# Patient Record
Sex: Male | Born: 1955 | Race: White | Hispanic: No | Marital: Married | Smoking: Former smoker
Health system: Southern US, Community
[De-identification: ages and names within clinical notes are randomized; demographics above are authoritative.]

## PROBLEM LIST (undated history)

## (undated) ENCOUNTER — Inpatient Hospital Stay: Admission: EM | Payer: Self-pay

## (undated) DIAGNOSIS — T4145XA Adverse effect of unspecified anesthetic, initial encounter: Secondary | ICD-10-CM

## (undated) DIAGNOSIS — I499 Cardiac arrhythmia, unspecified: Secondary | ICD-10-CM

## (undated) DIAGNOSIS — J449 Chronic obstructive pulmonary disease, unspecified: Secondary | ICD-10-CM

## (undated) DIAGNOSIS — I10 Essential (primary) hypertension: Secondary | ICD-10-CM

## (undated) DIAGNOSIS — R609 Edema, unspecified: Secondary | ICD-10-CM

## (undated) DIAGNOSIS — G4730 Sleep apnea, unspecified: Secondary | ICD-10-CM

## (undated) DIAGNOSIS — T7840XA Allergy, unspecified, initial encounter: Secondary | ICD-10-CM

## (undated) DIAGNOSIS — R51 Headache: Secondary | ICD-10-CM

## (undated) DIAGNOSIS — M1990 Unspecified osteoarthritis, unspecified site: Secondary | ICD-10-CM

## (undated) DIAGNOSIS — L570 Actinic keratosis: Secondary | ICD-10-CM

## (undated) DIAGNOSIS — I2510 Atherosclerotic heart disease of native coronary artery without angina pectoris: Secondary | ICD-10-CM

## (undated) DIAGNOSIS — J45909 Unspecified asthma, uncomplicated: Secondary | ICD-10-CM

## (undated) DIAGNOSIS — T8859XA Other complications of anesthesia, initial encounter: Secondary | ICD-10-CM

## (undated) DIAGNOSIS — R918 Other nonspecific abnormal finding of lung field: Secondary | ICD-10-CM

## (undated) DIAGNOSIS — R002 Palpitations: Secondary | ICD-10-CM

## (undated) DIAGNOSIS — K219 Gastro-esophageal reflux disease without esophagitis: Secondary | ICD-10-CM

## (undated) DIAGNOSIS — Z87442 Personal history of urinary calculi: Secondary | ICD-10-CM

## (undated) DIAGNOSIS — E538 Deficiency of other specified B group vitamins: Secondary | ICD-10-CM

## (undated) DIAGNOSIS — R519 Headache, unspecified: Secondary | ICD-10-CM

## (undated) HISTORY — PX: CHOLECYSTECTOMY: SHX55

## (undated) HISTORY — PX: COLONOSCOPY: SHX174

## (undated) HISTORY — DX: Actinic keratosis: L57.0

---
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
## 2006-04-12 ENCOUNTER — Ambulatory Visit: Payer: Self-pay | Admitting: Gastroenterology

## 2007-02-17 ENCOUNTER — Emergency Department: Payer: Self-pay | Admitting: Emergency Medicine

## 2009-10-23 ENCOUNTER — Emergency Department (HOSPITAL_COMMUNITY): Admission: EM | Admit: 2009-10-23 | Discharge: 2009-10-23 | Payer: Self-pay | Admitting: Emergency Medicine

## 2010-08-13 ENCOUNTER — Ambulatory Visit: Payer: Self-pay | Admitting: Gastroenterology

## 2010-08-14 LAB — PATHOLOGY REPORT

## 2010-12-27 LAB — POCT I-STAT, CHEM 8
Calcium, Ion: 1.16 mmol/L (ref 1.12–1.32)
HCT: 45 % (ref 39.0–52.0)
Hemoglobin: 15.3 g/dL (ref 13.0–17.0)
Sodium: 139 mEq/L (ref 135–145)
TCO2: 25 mmol/L (ref 0–100)

## 2013-11-29 LAB — CBC
HCT: 44.4 % (ref 40.0–52.0)
HGB: 15.6 g/dL (ref 13.0–18.0)
MCH: 29.2 pg (ref 26.0–34.0)
MCHC: 35.1 g/dL (ref 32.0–36.0)
MCV: 83 fL (ref 80–100)
Platelet: 231 10*3/uL (ref 150–440)
RBC: 5.33 10*6/uL (ref 4.40–5.90)
RDW: 13.2 % (ref 11.5–14.5)
WBC: 19.7 10*3/uL — AB (ref 3.8–10.6)

## 2013-11-29 LAB — BASIC METABOLIC PANEL
Anion Gap: 6 — ABNORMAL LOW (ref 7–16)
BUN: 11 mg/dL (ref 7–18)
CALCIUM: 9 mg/dL (ref 8.5–10.1)
CREATININE: 1.08 mg/dL (ref 0.60–1.30)
Chloride: 100 mmol/L (ref 98–107)
Co2: 29 mmol/L (ref 21–32)
EGFR (Non-African Amer.): >60
Glucose: 123 mg/dL — ABNORMAL HIGH (ref 65–99)
Osmolality: 271 (ref 275–301)
Potassium: 3.5 mmol/L (ref 3.5–5.1)
Sodium: 135 mmol/L — ABNORMAL LOW (ref 136–145)

## 2013-11-29 LAB — TROPONIN I: Troponin-I: <0.02 ng/mL

## 2013-11-30 ENCOUNTER — Inpatient Hospital Stay: Payer: Self-pay | Admitting: Internal Medicine

## 2013-11-30 LAB — CK-MB: CK-MB: 0.6 ng/mL (ref 0.5–3.6)

## 2013-11-30 LAB — TROPONIN I: Troponin-I: <0.02 ng/mL

## 2013-12-01 LAB — BASIC METABOLIC PANEL
ANION GAP: 4 — AB (ref 7–16)
BUN: 13 mg/dL (ref 7–18)
CO2: 26 mmol/L (ref 21–32)
CREATININE: 0.98 mg/dL (ref 0.60–1.30)
Calcium, Total: 8.5 mg/dL (ref 8.5–10.1)
Chloride: 105 mmol/L (ref 98–107)
GLUCOSE: 90 mg/dL (ref 65–99)
Osmolality: 270 (ref 275–301)
Potassium: 3.9 mmol/L (ref 3.5–5.1)
Sodium: 135 mmol/L — ABNORMAL LOW (ref 136–145)

## 2013-12-01 LAB — CBC WITH DIFFERENTIAL/PLATELET
BASOS ABS: 0.1 10*3/uL (ref 0.0–0.1)
BASOS PCT: 0.6 %
EOS ABS: 0 10*3/uL (ref 0.0–0.7)
EOS PCT: 0 %
HCT: 39.3 % — ABNORMAL LOW (ref 40.0–52.0)
HGB: 13.2 g/dL (ref 13.0–18.0)
LYMPHS ABS: 2.5 10*3/uL (ref 1.0–3.6)
Lymphocyte %: 14.9 %
MCH: 28.2 pg (ref 26.0–34.0)
MCHC: 33.5 g/dL (ref 32.0–36.0)
MCV: 84 fL (ref 80–100)
MONO ABS: 1.2 x10 3/mm — AB (ref 0.2–1.0)
Monocyte %: 7.2 %
NEUTROS ABS: 13 10*3/uL — AB (ref 1.4–6.5)
NEUTROS PCT: 77.3 %
Platelet: 230 10*3/uL (ref 150–440)
RBC: 4.66 10*6/uL (ref 4.40–5.90)
RDW: 12.9 % (ref 11.5–14.5)
WBC: 16.8 10*3/uL — ABNORMAL HIGH (ref 3.8–10.6)

## 2013-12-02 LAB — BASIC METABOLIC PANEL
ANION GAP: 5 — AB (ref 7–16)
BUN: 12 mg/dL (ref 7–18)
CHLORIDE: 106 mmol/L (ref 98–107)
CO2: 26 mmol/L (ref 21–32)
Calcium, Total: 8.3 mg/dL — ABNORMAL LOW (ref 8.5–10.1)
Creatinine: 0.94 mg/dL (ref 0.60–1.30)
GLUCOSE: 88 mg/dL (ref 65–99)
Osmolality: 273 (ref 275–301)
POTASSIUM: 3.8 mmol/L (ref 3.5–5.1)
SODIUM: 137 mmol/L (ref 136–145)

## 2013-12-02 LAB — CBC WITH DIFFERENTIAL/PLATELET
Basophil #: 0.1 10*3/uL (ref 0.0–0.1)
Basophil %: 0.6 %
Eosinophil #: 0 10*3/uL (ref 0.0–0.7)
Eosinophil %: 0.1 %
HCT: 38.3 % — AB (ref 40.0–52.0)
HGB: 13 g/dL (ref 13.0–18.0)
Lymphocyte #: 2.7 10*3/uL (ref 1.0–3.6)
Lymphocyte %: 22.8 %
MCH: 28.6 pg (ref 26.0–34.0)
MCHC: 33.9 g/dL (ref 32.0–36.0)
MCV: 84 fL (ref 80–100)
Monocyte #: 1 x10 3/mm (ref 0.2–1.0)
Monocyte %: 8.1 %
NEUTROS ABS: 8.2 10*3/uL — AB (ref 1.4–6.5)
Neutrophil %: 68.4 %
Platelet: 242 10*3/uL (ref 150–440)
RBC: 4.54 10*6/uL (ref 4.40–5.90)
RDW: 13.1 % (ref 11.5–14.5)
WBC: 12 10*3/uL — AB (ref 3.8–10.6)

## 2013-12-03 LAB — WBC: WBC: 11.7 10*3/uL — AB (ref 3.8–10.6)

## 2013-12-04 LAB — EXPECTORATED SPUTUM ASSESSMENT W REFEX TO RESP CULTURE

## 2013-12-05 LAB — CULTURE, BLOOD (SINGLE)

## 2014-01-16 ENCOUNTER — Ambulatory Visit: Payer: Self-pay | Admitting: Internal Medicine

## 2014-10-22 DIAGNOSIS — I10 Essential (primary) hypertension: Secondary | ICD-10-CM | POA: Insufficient documentation

## 2014-10-30 ENCOUNTER — Ambulatory Visit: Payer: Self-pay | Admitting: Hospice and Palliative Medicine

## 2015-02-01 NOTE — H&P (Signed)
PATIENT NAME:  Derrick, Sheppard MR#:  [id number] DATE OF BIRTH:  November 20, 1955  DATE OF ADMISSION:  11/30/2013  PRIMARY CARE PHYSICIAN:  Dr. Ramonita Lab.   REFERRING PHYSICIAN:  Dr. Owens Shark.   CHIEF COMPLAINT:  Shortness of breath and fever.   HISTORY OF PRESENT ILLNESS:  The patient is a 59 year old Caucasian male with past medical history of COPD, still smoking 1 pack a day, GERD, is presenting to the ER with a chief complaint of shortness of breath and cough for the past two weeks.  The patient is reporting that he has been having chest tightness associated with shortness of breath on and off for the past two weeks.  The patient was seen by his primary care physician, Dr. Caryl Comes last Monday and he was diagnosed with strep throat and bronchitis.  The patient was started on doxycycline and by mouth prednisone with no significant improvement.  The patient has a chronic history of COPD, still smoking, but not on home oxygen.  As the patient's shortness of breath is progressively getting worse associated with chest tightness.  Family members have called EMS.  As per EMS, the patient was hypoxic with pulse ox 86% on room air.  He was placed on 2 liters of oxygen and subsequently pulse ox went up to 93%.  Chest x-ray has revealed a right upper lobe air space disease with some fluid collection in the left side.  The patient is complaining of productive cough for the past 10 days and reporting that his whole chest hurts while taking deep inspirations and coughing.  He felt like he was feverish, but did not check temperature.  Denies any dizziness or loss of consciousness.  Denies any heart attacks in the past.  The patient's wife, daughter and granddaughter are at bedside.  The patient was given IV morphine in the ER to help with the pain.  Initial troponin is negative.   PAST MEDICAL HISTORY:   1.  GERD.  2.  COPD.  3.  Nicotine dependence.   PAST SURGICAL HISTORY:  Colonoscopy status post polypectomy and polyp  was benign.   ALLERGIES:  CODEINE.   PSYCHOSOCIAL HISTORY:  Lives at home with wife.  Smokes 1 pack a day, started at age 79.  Still continues to smoke.  Denies alcohol or illicit drug usage.   FAMILY HISTORY:  Dad deceased with some heart valve abnormalities.   HOME MEDICATIONS:   1.  Spiriva capsule one inhalation once a day.  2.  Advair 1 puff inhalation 2 times a day.   REVIEW OF SYSTEMS:  CONSTITUTIONAL:  Complaining of low-grade fever, fatigue.  EYES:  Denies blurry vision, double vision.  EARS, NOSE, THROAT:  Denies epistaxis, discharge.  Complaining of sore throat.  RESPIRATION:  Complaining of productive cough.  Has chronic history of COPD.  Chest hurts with coughing.  CARDIOVASCULAR:  No orthopnea, edema, erythema.  Complaining of chest pain with cough.  GASTROINTESTINAL:  Denies nausea, vomiting, diarrhea, abdominal pain.  GENITOURINARY:  No dysuria or hematuria.  ENDOCRINE:  Denies polyuria, nocturia, thyroid problems.  HEMATOLOGIC AND LYMPHATIC:  No anemia, easy bruising, bleeding.  INTEGUMENTARY:  No acne, rash, lesions.  MUSCULOSKELETAL:  No joint pain in the neck and back.  Denies gout.  NEUROLOGIC:  Denies vertigo, ataxia.  PSYCHIATRIC:  No ADD, OCD.   PHYSICAL EXAMINATION:  VITAL SIGNS:  Temperature 97.6, pulse 73, respirations 20, blood pressure 159/91, pulse ox 98% on 2 liters.  GENERAL APPEARANCE:  Not under acute  distress.  Moderately built and nourished, uncomfortable with diffuse chest tenderness while coughing.  HEENT:  Normocephalic, atraumatic.  Pupils are equal, reacting light and accommodation.  No scleral icterus.  No conjunctival injection.  No sinus tenderness.  No postnasal drip.  Throat is erythematous.  Uvula is midline.  NECK:  Supple.  No JVD.  No thyromegaly.  Range of motion is intact.  LUNGS:  Coarse bronchial breath sounds with crackles more on the right side and decreased breath sounds on the left side.  Positive anterior chest wall tenderness  on palpation.  CARDIAC:  S1, S2 normal.  Regular rate and rhythm.  No murmurs.  GASTROINTESTINAL:  Soft.  Bowel sounds are positive in all four quadrants.  Nontender, nondistended.  No hepatosplenomegaly.  No masses felt.  NEUROLOGIC:  Awake, oriented x 3.  Cranial nerves II through XII are grossly intact.  Motor and sensory are intact.  Reflexes are 2+.  EXTREMITIES:  No edema.  No cyanosis.  No clubbing.  SKIN:  Warm to touch.  Normal turgor.  No rashes.  No lesions.  MUSCULOSKELETAL:  No joint effusion, tenderness, erythema.  PSYCHIATRIC:  Normal mood and affect.   LABORATORIES AND IMAGING STUDIES:  Troponin is less than 0.02.  CBC is normal except white count is elevated at 19.7.  Chem-8 is normal except glucose at 123, sodium at 135.  Anion gap 6.  The rest of the Chem-8 labs are normal.  A 12-lead EKG, normal sinus rhythm at 70 beats per minute, normal PR interval.  No acute ST-T wave changes.  Chest x-ray, portable, has revealed peripheral right upper lobe airspace opacity concerning for pneumonia.  Recommended follow-up after treatment to ensure resolution.  Left base atelectasis, questionable small left effusion, chronic COPD with chronic changes.   ASSESSMENT AND PLAN:  A 59 year old male presenting to the ER with a chief complaint of two week history of intermittent episodes of shortness of breath and cough started.  The patient was seen by primary care physician and started on by mouth doxycycline and prednisone last Monday, will be admitted with the following assessment and plan.  1.  Right upper lobe pneumonia, probably community-acquired, failed outpatient antibiotics.  We will admit him to tele bed.  We will provide him IV levofloxacin.  Sputum cultures were ordered.  We will provide him DuoNeb neb treatments q. 6 hours as needed for shortness of breath and wheezing. 2.  Generalized chest pain which is reproducible in nature, probably musculoskeletal in nature from persistent cough for  the past two weeks, could be from pleuritic as well because of the underlying pneumonia.  We will provide him ibuprofen as needed basis.  3.  Chronic history of chronic obstructive pulmonary disease.  Still continues to smoke.  We will provide him DuoNeb neb treatments as-needed basis.   4.  Nicotine dependence.  The patient was counseled to quit smoking for three minutes.  He will think about it.  5.  Gastroesophageal reflux disease.  The patient will be on PPI.  6.  We will provide him deep vein thrombosis prophylaxis. 7.  CODE STATUS:  HE IS FULL CODE.  Wife is medical power of attorney.   Diagnosis and plan of care was discussed with the patient and his family members at bedside.  They all verbalized understanding of the plan.  The patient will be transferred to Dr. Ramonita Lab in a.m.   Total time spent on admission is 45 minutes.    ____________________________ Nicholes Mango,  MD ag:ea D: 11/30/2013 02:04:00 ET T: 11/30/2013 02:42:24 ET JOB#: [id number]  cc: Nicholes Mango, MD, <Dictator> Adin Hector, MD Nicholes Mango MD ELECTRONICALLY SIGNED 12/14/2013 7:10

## 2015-02-01 NOTE — Discharge Summary (Signed)
PATIENT NAME:  Derrick Sheppard, Derrick Sheppard MR#:  [id number] DATE OF BIRTH:  April 08, 1956  DATE OF ADMISSION:  11/30/2013 DATE OF DISCHARGE:  12/03/2013  FINAL DIAGNOSES: 1. Acute respiratory failure secondary to pneumonia.  2. Right upper lobe pneumonia.  3. Chronic obstructive pulmonary disease with acute exacerbation.  4. Hypertension.  5. Tobacco abuse.   HISTORY AND PHYSICAL: Please see dictated admission history and physical.   HOSPITAL COURSE: The patient was admitted with finding of acute respiratory failure as documented by his need for supplemental oxygen to keep his saturation up. He had significant chest pain, pleuritic as well. Cardiac enzymes were followed, which were negative. Chest x-ray revealed right upper lobe pneumonia, which was new from approximately three days ago in the office. He underwent CT scan by PE protocol on 11/30/2013, which confirmed the pneumonia, showed no evidence of pulmonary embolism. He was noted to have right hilar adenopathy, likely reactive, with a 15 mm right lower lobe nodule and a 17 mm left upper lobe nodule. The recommendation was to follow these nodules after treatment of the above and consider biopsy.   He showed slow but steady improvement in his chest discomfort, was weaned over to oral medications and oral antibiotics. He was continued on medications for COPD exacerbation and tolerated this well. Oxygen was weaned down; however, on the day of discharge, his oxygen saturation is 88% on room air at bedside. When he ambulates his oxygen would also stay around 88%, then recover after being placed on 2 liters nasal cannula up in the 93% range.  Therefore, home oxygen will be set up for the patient. At this point, he will be discharged home in stable condition with his physical activity up as tolerated with oxygen. Follow-up in our office in 7 to 10 days. He will follow a 2 gram sodium diet.   DISCHARGE MEDICATIONS: 1. Spiriva 1 capsule inhaled daily.  2. Advair  250/50 1 puff b.i.d.  3. Vitamin B12 1000 mcg p.o. daily.  4. Vitamin D3 1000 units p.o. daily.  5. Albuterol metered-dose inhaler with adapter 2 puffs 4 times a day.  6. Prednisone taper starting at 60 mg p.o. daily, decreasing x 10 mg every two days.  7. Norco 5/325, 1 p.o. q.4h. as needed for pain.  8. Amlodipine 5 mg p.o. daily. 9. Levaquin 750 mg p.o. daily x 10 days to complete 14 day course.  10. NicoDerm patch 21 mg topically daily to be weaned down to 14 mg after six weeks, then 7 mg six weeks later.   He will be maintained on oxygen 2 liters nasal cannula continuously with a portable system.  ____________________________ Tama High III, MD bjk:sg D: 12/03/2013 08:16:42 ET T: 12/03/2013 11:09:43 ET JOB#: [id number]  cc: Tama High III, MD, <Dictator>  Ramonita Lab MD ELECTRONICALLY SIGNED 12/05/2013 8:05

## 2015-02-03 ENCOUNTER — Ambulatory Visit
Admit: 2015-02-03 | Disposition: A | Discharge status: Home / Self Care | Payer: Self-pay | Attending: Internal Medicine | Admitting: Internal Medicine

## 2015-12-30 ENCOUNTER — Other Ambulatory Visit: Payer: Self-pay | Admitting: Acute Care

## 2015-12-30 DIAGNOSIS — F17210 Nicotine dependence, cigarettes, uncomplicated: Secondary | ICD-10-CM

## 2016-01-14 ENCOUNTER — Other Ambulatory Visit: Payer: Self-pay | Admitting: Internal Medicine

## 2016-01-14 DIAGNOSIS — R748 Abnormal levels of other serum enzymes: Secondary | ICD-10-CM

## 2016-01-22 ENCOUNTER — Ambulatory Visit
Admission: RE | Admit: 2016-01-22 | Discharge: 2016-01-22 | Disposition: A | Discharge status: Home / Self Care | Payer: 59 | Source: Ambulatory Visit | Attending: Internal Medicine | Admitting: Internal Medicine

## 2016-01-22 DIAGNOSIS — R748 Abnormal levels of other serum enzymes: Secondary | ICD-10-CM | POA: Diagnosis present

## 2016-01-22 DIAGNOSIS — K8020 Calculus of gallbladder without cholecystitis without obstruction: Secondary | ICD-10-CM | POA: Diagnosis not present

## 2016-02-04 ENCOUNTER — Telehealth: Payer: Self-pay | Admitting: *Deleted

## 2016-02-04 NOTE — Telephone Encounter (Signed)
Contacted by outpatient imaging center inquiring about documentation for patient with lung cancer screening scan scheduled for tomorrow. Patient contacted and reviewed history as well as ensuring that patient knows when and where to go for CT scan.  Verified patient's age, no signs of lung cancer, no illness that would prevent patient from receiving treatment for lung cancer, and smoking history (41pack year history, current smoker). Shared decision making visit was completed 10/30/14.

## 2016-02-05 ENCOUNTER — Ambulatory Visit
Admission: RE | Admit: 2016-02-05 | Discharge: 2016-02-05 | Disposition: A | Discharge status: Home / Self Care | Payer: 59 | Source: Ambulatory Visit | Attending: Acute Care | Admitting: Acute Care

## 2016-02-05 DIAGNOSIS — I2510 Atherosclerotic heart disease of native coronary artery without angina pectoris: Secondary | ICD-10-CM | POA: Diagnosis not present

## 2016-02-05 DIAGNOSIS — F17210 Nicotine dependence, cigarettes, uncomplicated: Secondary | ICD-10-CM

## 2016-02-09 ENCOUNTER — Telehealth: Payer: Self-pay | Admitting: *Deleted

## 2016-02-09 NOTE — Telephone Encounter (Signed)
Notified patient of LDCT lung cancer screening results of Lung Rads 2 finding with recommendation for 12 month follow up imaging. Also notified of incidental finding noted below. Patient verbalizes understanding.   IMPRESSION: 1. Lung-RADS Category 2, benign appearance or behavior. Continue annual screening with low-dose chest CT without contrast in 12 months. 2. Three-vessel coronary artery calcification.

## 2016-04-08 ENCOUNTER — Encounter: Payer: Self-pay | Admitting: *Deleted

## 2016-04-09 ENCOUNTER — Encounter
Admission: RE | Disposition: A | Discharge status: Home / Self Care | Payer: Self-pay | Source: Ambulatory Visit | Attending: Gastroenterology

## 2016-04-09 ENCOUNTER — Ambulatory Visit: Payer: 59 | Admitting: Anesthesiology

## 2016-04-09 ENCOUNTER — Ambulatory Visit
Admission: RE | Admit: 2016-04-09 | Discharge: 2016-04-09 | Disposition: A | Discharge status: Home / Self Care | Payer: 59 | Source: Ambulatory Visit | Attending: Gastroenterology | Admitting: Gastroenterology

## 2016-04-09 ENCOUNTER — Encounter: Payer: Self-pay | Admitting: *Deleted

## 2016-04-09 DIAGNOSIS — Z800 Family history of malignant neoplasm of digestive organs: Secondary | ICD-10-CM | POA: Diagnosis not present

## 2016-04-09 DIAGNOSIS — Z7951 Long term (current) use of inhaled steroids: Secondary | ICD-10-CM | POA: Insufficient documentation

## 2016-04-09 DIAGNOSIS — Z86010 Personal history of colonic polyps: Secondary | ICD-10-CM | POA: Insufficient documentation

## 2016-04-09 DIAGNOSIS — E538 Deficiency of other specified B group vitamins: Secondary | ICD-10-CM | POA: Insufficient documentation

## 2016-04-09 DIAGNOSIS — Z791 Long term (current) use of non-steroidal anti-inflammatories (NSAID): Secondary | ICD-10-CM | POA: Insufficient documentation

## 2016-04-09 DIAGNOSIS — Z1211 Encounter for screening for malignant neoplasm of colon: Secondary | ICD-10-CM | POA: Diagnosis not present

## 2016-04-09 DIAGNOSIS — Z888 Allergy status to other drugs, medicaments and biological substances status: Secondary | ICD-10-CM | POA: Insufficient documentation

## 2016-04-09 DIAGNOSIS — K219 Gastro-esophageal reflux disease without esophagitis: Secondary | ICD-10-CM | POA: Diagnosis not present

## 2016-04-09 DIAGNOSIS — J449 Chronic obstructive pulmonary disease, unspecified: Secondary | ICD-10-CM | POA: Diagnosis not present

## 2016-04-09 DIAGNOSIS — I2510 Atherosclerotic heart disease of native coronary artery without angina pectoris: Secondary | ICD-10-CM | POA: Insufficient documentation

## 2016-04-09 DIAGNOSIS — M1990 Unspecified osteoarthritis, unspecified site: Secondary | ICD-10-CM | POA: Diagnosis not present

## 2016-04-09 DIAGNOSIS — Z79899 Other long term (current) drug therapy: Secondary | ICD-10-CM | POA: Insufficient documentation

## 2016-04-09 DIAGNOSIS — Z885 Allergy status to narcotic agent status: Secondary | ICD-10-CM | POA: Insufficient documentation

## 2016-04-09 DIAGNOSIS — D124 Benign neoplasm of descending colon: Secondary | ICD-10-CM | POA: Insufficient documentation

## 2016-04-09 DIAGNOSIS — D123 Benign neoplasm of transverse colon: Secondary | ICD-10-CM | POA: Insufficient documentation

## 2016-04-09 HISTORY — DX: Atherosclerotic heart disease of native coronary artery without angina pectoris: I25.10

## 2016-04-09 HISTORY — DX: Other nonspecific abnormal finding of lung field: R91.8

## 2016-04-09 HISTORY — DX: Unspecified osteoarthritis, unspecified site: M19.90

## 2016-04-09 HISTORY — DX: Deficiency of other specified B group vitamins: E53.8

## 2016-04-09 HISTORY — DX: Unspecified asthma, uncomplicated: J45.909

## 2016-04-09 HISTORY — DX: Allergy, unspecified, initial encounter: T78.40XA

## 2016-04-09 HISTORY — DX: Cardiac arrhythmia, unspecified: I49.9

## 2016-04-09 HISTORY — PX: COLONOSCOPY WITH PROPOFOL: SHX5780

## 2016-04-09 HISTORY — DX: Headache, unspecified: R51.9

## 2016-04-09 HISTORY — DX: Chronic obstructive pulmonary disease, unspecified: J44.9

## 2016-04-09 HISTORY — DX: Palpitations: R00.2

## 2016-04-09 HISTORY — DX: Headache: R51

## 2016-04-09 HISTORY — DX: Gastro-esophageal reflux disease without esophagitis: K21.9

## 2016-04-09 HISTORY — DX: Edema, unspecified: R60.9

## 2016-04-09 SURGERY — COLONOSCOPY WITH PROPOFOL
Anesthesia: General

## 2016-04-09 MED ORDER — FENTANYL CITRATE (PF) 100 MCG/2ML IJ SOLN
INTRAMUSCULAR | Status: DC | PRN
  Administered 2016-04-09: 50 ug via INTRAVENOUS

## 2016-04-09 MED ORDER — SODIUM CHLORIDE 0.9 % IV SOLN
INTRAVENOUS | Status: DC
  Administered 2016-04-09: 11:00:00 via INTRAVENOUS
  Administered 2016-04-09: 1000 mL via INTRAVENOUS

## 2016-04-09 MED ORDER — PROPOFOL 10 MG/ML IV BOLUS
INTRAVENOUS | Status: DC | PRN
  Administered 2016-04-09: 50 mg via INTRAVENOUS
  Administered 2016-04-09 (×2): 25 mg via INTRAVENOUS

## 2016-04-09 MED ORDER — PROPOFOL 500 MG/50ML IV EMUL
INTRAVENOUS | Status: DC | PRN
  Administered 2016-04-09: 140 ug/kg/min via INTRAVENOUS

## 2016-04-09 MED ORDER — MIDAZOLAM HCL 5 MG/5ML IJ SOLN
INTRAMUSCULAR | Status: DC | PRN
  Administered 2016-04-09: 2 mg via INTRAVENOUS

## 2016-04-09 MED ORDER — SODIUM CHLORIDE 0.9 % IV SOLN: INTRAVENOUS | Status: DC

## 2016-04-09 MED ORDER — EPHEDRINE SULFATE 50 MG/ML IJ SOLN
INTRAMUSCULAR | Status: DC | PRN
  Administered 2016-04-09: 10 mg via INTRAVENOUS

## 2016-04-09 MED ORDER — LIDOCAINE 2% (20 MG/ML) 5 ML SYRINGE
INTRAMUSCULAR | Status: DC | PRN
  Administered 2016-04-09: 40 mg via INTRAVENOUS

## 2016-04-09 NOTE — Anesthesia Procedure Notes (Signed)
Date/Time: 04/09/2016 11:20 AM Performed by: Allean Found Pre-anesthesia Checklist: Patient identified, Emergency Drugs available, Suction available, Patient being monitored and Timeout performed Patient Re-evaluated:Patient Re-evaluated prior to inductionOxygen Delivery Method: Nasal cannula Intubation Type: IV induction

## 2016-04-09 NOTE — Anesthesia Postprocedure Evaluation (Signed)
Anesthesia Post Note  Patient: Derrick Sheppard  Procedure(s) Performed: Procedure(s) (LRB): COLONOSCOPY WITH PROPOFOL (N/A)  Patient location during evaluation: Endoscopy Anesthesia Type: General Level of consciousness: awake and alert Pain management: pain level controlled Vital Signs Assessment: post-procedure vital signs reviewed and stable Respiratory status: spontaneous breathing, nonlabored ventilation, respiratory function stable and patient connected to nasal cannula oxygen Cardiovascular status: blood pressure returned to baseline and stable Postop Assessment: no signs of nausea or vomiting Anesthetic complications: no    Last Vitals:  Filed Vitals:   04/09/16 1310 04/09/16 1320  BP: 132/78 141/74  Pulse: 54 51  Temp:    Resp: 15 12    Last Pain: There were no vitals filed for this visit.               Myshawn Chiriboga S

## 2016-04-09 NOTE — Op Note (Signed)
[facility] Gastroenterology Patient Name: Derrick Sheppard Procedure Date: 04/09/2016 11:05 AM MRN: [id number] Account #: [id number] Date of Birth: 1956-08-10 Admit Type: Outpatient Age: 60 Room: [facility] ENDO ROOM 1 Gender: Male Note Status: Finalized Procedure:            Colonoscopy Indications:          Family history of colon cancer in a first-degree                        relative, Personal history of colonic polyps Providers:            Lollie Sails, MD Referring MD:         Ramonita Lab, MD (Referring MD) Medicines:            Monitored Anesthesia Care Complications:        No immediate complications. Procedure:            Pre-Anesthesia Assessment:                       - ASA Grade Assessment: III - A patient with severe                        systemic disease.                       After obtaining informed consent, the colonoscope was                        passed under direct vision. Throughout the procedure,                        the patient's blood pressure, pulse, and oxygen                        saturations were monitored continuously. The                        Colonoscope was introduced through the anus and                        advanced to the the cecum, identified by appendiceal                        orifice and ileocecal valve. The colonoscopy was                        unusually difficult due to a tortuous colon and unable                        to hold insufflation. Successful completion of the                        procedure was aided by using manual pressure. The                        patient tolerated the procedure well. The quality of                        the bowel preparation was fair. Findings:      Three sessile polyps were found in the descending  colon. The polyps were       1 to 3 mm in size. These polyps were removed with a cold biopsy forceps.       Resection and retrieval were complete.      A 2 to 3 mm polyp was found  in the proximal descending colon. The polyp       was sessile. The polyp was removed with a cold biopsy forceps. Resection       and retrieval were complete.      A 5 mm polyp was found in the proximal transverse colon. The polyp was       flat. The polyp was removed with a cold snare. Resection and retrieval       were complete.      A 20 mm polyp was found in the hepatic flexure. The polyp was sessile.       The polyp was removed with a cold snare. Resection and retrieval were       complete. To prevent bleeding after the polypectomy, two hemostatic       clips were successfully placed. There was no bleeding at the end of the       maneuver.      A 16 mm polyp was found in the proximal descending colon. The polyp was       sessile. The polyp was removed with a cold snare. Resection and       retrieval were complete. Impression:           - Preparation of the colon was fair.                       - Three 1 to 3 mm polyps in the descending colon,                        removed with a cold biopsy forceps. Resected and                        retrieved.                       - One 2 to 3 mm polyp in the proximal descending colon,                        removed with a cold biopsy forceps. Resected and                        retrieved.                       - One 5 mm polyp in the proximal transverse colon,                        removed with a cold snare. Resected and retrieved.                       - One 20 mm polyp at the hepatic flexure, removed with                        a cold snare. Resected and retrieved. Clips were placed.                       - One  16 mm polyp in the proximal descending colon,                        removed with a cold snare. Resected and retrieved. Recommendation:       - Await pathology results.                       - Clear liquid diet today.                       - Full liquid diet for 3 days, then advance as                        tolerated to low residue  diet for 5 days. Procedure Code(s):    --- Professional ---                       [phone], Colonoscopy, flexible; with removal of tumor(s),                        polyp(s), or other lesion(s) by snare technique                       45380, 6, Colonoscopy, flexible; with biopsy, single                        or multiple Diagnosis Code(s):    --- Professional ---                       D12.4, Benign neoplasm of descending colon                       D12.3, Benign neoplasm of transverse colon (hepatic                        flexure or splenic flexure)                       Z80.0, Family history of malignant neoplasm of                        digestive organs                       Z86.010, Personal history of colonic polyps CPT copyright 2016 American Medical Association. All rights reserved. The codes documented in this report are preliminary and upon coder review may  be revised to meet current compliance requirements. Lollie Sails, MD 04/09/2016 12:46:18 PM This report has been signed electronically. Number of Addenda: 0 Note Initiated On: 04/09/2016 11:05 AM Scope Withdrawal Time: 0 hours 47 minutes 36 seconds  Total Procedure Duration: 1 hour 15 minutes 15 seconds       [facility]

## 2016-04-09 NOTE — Transfer of Care (Signed)
Immediate Anesthesia Transfer of Care Note  Patient: Derrick Sheppard  Procedure(s) Performed: Procedure(s): COLONOSCOPY WITH PROPOFOL (N/A)  Patient Location: PACU  Anesthesia Type:General  Level of Consciousness: awake, alert  and oriented  Airway & Oxygen Therapy: Patient Spontanous Breathing and Patient connected to nasal cannula oxygen  Post-op Assessment: Report given to RN and Post -op Vital signs reviewed and stable  Post vital signs: Reviewed and stable  Last Vitals:  Filed Vitals:   04/09/16 1250 04/09/16 1251  BP: 134/73 134/73  Pulse: 67   Temp: 35.9 C   Resp: 13 12    Last Pain: There were no vitals filed for this visit.       Complications: no complications

## 2016-04-09 NOTE — Anesthesia Preprocedure Evaluation (Signed)
Anesthesia Evaluation  Patient identified by MRN, date of birth, ID band Patient awake    Reviewed: Allergy & Precautions, NPO status , Patient's Chart, lab work & pertinent test results, reviewed documented beta blocker date and time   Airway Mallampati: II  TM Distance: >3 FB     Dental  (+) Chipped   Pulmonary asthma , COPD, Current Smoker,           Cardiovascular + dysrhythmias      Neuro/Psych  Headaches,    GI/Hepatic GERD  ,  Endo/Other    Renal/GU      Musculoskeletal  (+) Arthritis ,   Abdominal   Peds  Hematology   Anesthesia Other Findings   Reproductive/Obstetrics                             Anesthesia Physical Anesthesia Plan  ASA: II  Anesthesia Plan: General   Post-op Pain Management:    Induction: Intravenous  Airway Management Planned: Nasal Cannula  Additional Equipment:   Intra-op Plan:   Post-operative Plan:   Informed Consent: I have reviewed the patients History and Physical, chart, labs and discussed the procedure including the risks, benefits and alternatives for the proposed anesthesia with the patient or authorized representative who has indicated his/her understanding and acceptance.     Plan Discussed with: CRNA  Anesthesia Plan Comments:         Anesthesia Quick Evaluation

## 2016-04-09 NOTE — H&P (Signed)
Outpatient short stay form Pre-procedure 04/09/2016 11:08 AM Lollie Sails MD  Primary Physician: Dr. Ramonita Lab  Reason for visit:  Colonoscopy  History of present illness:  Patient is a 60 year old male presenting today for colonoscopy. His last colonoscopy was in 2007 findings of polyps that time. He also has a family history of colon cancer primary relative, father. He tolerated his prep well. He takes no aspirin or blood thinning agents.    Current facility-administered medications:  .  0.9 %  sodium chloride infusion, , Intravenous, Continuous, Lollie Sails, MD, Last Rate: 20 mL/hr at 04/09/16 1044, 1,000 mL at 04/09/16 1044 .  0.9 %  sodium chloride infusion, , Intravenous, Continuous, Lollie Sails, MD  Prescriptions prior to admission  Medication Sig Dispense Refill Last Dose  . albuterol (PROVENTIL HFA;VENTOLIN HFA) 108 (90 Base) MCG/ACT inhaler Inhale 2 puffs into the lungs every 6 (six) hours as needed for wheezing or shortness of breath.   04/09/2016 at 0600  . B Complex-C (B-COMPLEX WITH VITAMIN C) tablet Take 1 tablet by mouth daily.     . Cholecalciferol 1000 units TBDP Take 1,000 Units/day by mouth daily.     Marland Kitchen esomeprazole (NEXIUM) 40 MG capsule Take 40 mg by mouth daily at 12 noon.     . Fluticasone-Salmeterol (ADVAIR) 250-50 MCG/DOSE AEPB Inhale 1 puff into the lungs 2 (two) times daily.   04/09/2016 at 0600  . ibuprofen (ADVIL,MOTRIN) 200 MG tablet Take 200 mg by mouth every 4 (four) hours as needed.     Marland Kitchen losartan (COZAAR) 50 MG tablet Take 50 mg by mouth daily.     Marland Kitchen tiotropium (SPIRIVA) 18 MCG inhalation capsule Place 18 mcg into inhaler and inhale daily.   04/09/2016 at 0600     Allergies  Allergen Reactions  . Amlodipine Swelling    Makes his ankles and legs swell.  . Codeine Other (See Comments)    Gives him a Headache.     Past Medical History  Diagnosis Date  . Allergic state   . Arthritis   . Headache   . B12 deficiency   . COPD  (chronic obstructive pulmonary disease) ([place])   . Coronary artery disease   . Edema   . Dysrhythmia   . GERD (gastroesophageal reflux disease)   . Palpitations   . Pulmonary nodules   . Asthma     Review of systems:      Physical Exam    Heart and lungs: Regular rate and rhythm without rub or gallop, lungs are bilaterally clear.    HEENT: Normocephalic atraumatic eyes are anicteric    Other:     Pertinant exam for procedure: Soft nontender nondistended bowel sounds positive normoactive.    Planned proceedures: Colonoscopy and indicated procedures. I have discussed the risks benefits and complications of procedures to include not limited to bleeding, infection, perforation and the risk of sedation and the patient wishes to proceed.  Lollie Sails, MD Gastroenterology

## 2016-04-12 ENCOUNTER — Encounter: Payer: Self-pay | Admitting: Gastroenterology

## 2016-04-14 LAB — SURGICAL PATHOLOGY

## 2016-07-14 DIAGNOSIS — D692 Other nonthrombocytopenic purpura: Secondary | ICD-10-CM | POA: Insufficient documentation

## 2017-01-06 DIAGNOSIS — E538 Deficiency of other specified B group vitamins: Secondary | ICD-10-CM | POA: Diagnosis not present

## 2017-01-06 DIAGNOSIS — I10 Essential (primary) hypertension: Secondary | ICD-10-CM | POA: Diagnosis not present

## 2017-01-12 DIAGNOSIS — Z0000 Encounter for general adult medical examination without abnormal findings: Secondary | ICD-10-CM | POA: Diagnosis not present

## 2017-01-12 DIAGNOSIS — I2510 Atherosclerotic heart disease of native coronary artery without angina pectoris: Secondary | ICD-10-CM | POA: Diagnosis not present

## 2017-01-12 DIAGNOSIS — I10 Essential (primary) hypertension: Secondary | ICD-10-CM | POA: Diagnosis not present

## 2017-01-31 ENCOUNTER — Telehealth: Payer: Self-pay | Admitting: *Deleted

## 2017-01-31 DIAGNOSIS — Z87891 Personal history of nicotine dependence: Secondary | ICD-10-CM

## 2017-01-31 NOTE — Telephone Encounter (Signed)
Notified patient that annual lung cancer screening low dose CT scan is due. Confirmed that patient is within the age range of 55-77, and asymptomatic, (no signs or symptoms of lung cancer). Patient denies illness that would prevent curative treatment for lung cancer if found. The patient is a current smoker, with a 42 pack year history. The shared decision making visit was done 10/30/14. Patient is agreeable for CT scan being scheduled.

## 2017-02-01 ENCOUNTER — Telehealth: Payer: Self-pay

## 2017-02-01 NOTE — Telephone Encounter (Signed)
Spoke with patient appt details given 5/3 @ 11 am  Outpatient imaging center call [phone] to r/s appt

## 2017-02-10 ENCOUNTER — Ambulatory Visit
Admission: RE | Admit: 2017-02-10 | Discharge: 2017-02-10 | Disposition: A | Discharge status: Home / Self Care | Payer: 59 | Source: Ambulatory Visit | Attending: Oncology | Admitting: Oncology

## 2017-02-10 DIAGNOSIS — I2510 Atherosclerotic heart disease of native coronary artery without angina pectoris: Secondary | ICD-10-CM | POA: Diagnosis not present

## 2017-02-10 DIAGNOSIS — Z87891 Personal history of nicotine dependence: Secondary | ICD-10-CM

## 2017-02-10 DIAGNOSIS — I700 Atherosclerosis of aorta: Secondary | ICD-10-CM | POA: Diagnosis not present

## 2017-02-10 DIAGNOSIS — R918 Other nonspecific abnormal finding of lung field: Secondary | ICD-10-CM | POA: Insufficient documentation

## 2017-02-11 ENCOUNTER — Telehealth: Payer: Self-pay | Admitting: *Deleted

## 2017-02-11 NOTE — Telephone Encounter (Signed)
Notified patient of LDCT lung cancer screening program results with recommendation for 3 month follow up imaging. Also notified of incidental findings noted below and is encouraged to discuss further with PCP who will receive a copy of this note and/or the CT report. Patient verbalizes understanding.   IMPRESSION: 1. Lung-RADS Category 4A, suspicious. Follow up low-dose chest CT without contrast in 3 months (please use the following order, "CT CHEST LCS NODULE FOLLOW-UP W/O CM") is recommended. Alternatively, PET may be considered when there is a solid component 41mm or larger. 2. Aortic Atherosclerosis (ICD10-I70.0) and Emphysema (ICD10-J43.9). 3. Multi vessel coronary artery calcification.

## 2017-05-03 DIAGNOSIS — L720 Epidermal cyst: Secondary | ICD-10-CM | POA: Diagnosis not present

## 2017-05-03 DIAGNOSIS — L570 Actinic keratosis: Secondary | ICD-10-CM | POA: Diagnosis not present

## 2017-05-03 DIAGNOSIS — L905 Scar conditions and fibrosis of skin: Secondary | ICD-10-CM | POA: Diagnosis not present

## 2017-05-03 DIAGNOSIS — L578 Other skin changes due to chronic exposure to nonionizing radiation: Secondary | ICD-10-CM | POA: Diagnosis not present

## 2017-05-12 ENCOUNTER — Telehealth: Payer: Self-pay | Admitting: *Deleted

## 2017-05-12 DIAGNOSIS — R9389 Abnormal findings on diagnostic imaging of other specified body structures: Secondary | ICD-10-CM

## 2017-05-12 NOTE — Telephone Encounter (Signed)
Notified patient that lung cancer screening low dose CT scan followup is due currently or will be in near future. Confirmed that patient is within the age range of 55-77, and asymptomatic, (no signs or symptoms of lung cancer). Patient denies illness that would prevent curative treatment for lung cancer if found. Verified smoking history, (current, 42 pack year). The shared decision making visit was done 10/30/14. Patient is agreeable for CT scan being scheduled.

## 2017-05-26 ENCOUNTER — Ambulatory Visit
Admission: RE | Admit: 2017-05-26 | Discharge: 2017-05-26 | Disposition: A | Discharge status: Home / Self Care | Payer: 59 | Source: Ambulatory Visit | Attending: Oncology | Admitting: Oncology

## 2017-05-26 DIAGNOSIS — R9389 Abnormal findings on diagnostic imaging of other specified body structures: Secondary | ICD-10-CM

## 2017-05-26 DIAGNOSIS — J439 Emphysema, unspecified: Secondary | ICD-10-CM | POA: Insufficient documentation

## 2017-05-26 DIAGNOSIS — I700 Atherosclerosis of aorta: Secondary | ICD-10-CM | POA: Insufficient documentation

## 2017-05-26 DIAGNOSIS — R911 Solitary pulmonary nodule: Secondary | ICD-10-CM | POA: Insufficient documentation

## 2017-05-26 DIAGNOSIS — R938 Abnormal findings on diagnostic imaging of other specified body structures: Secondary | ICD-10-CM | POA: Diagnosis present

## 2017-05-30 ENCOUNTER — Encounter: Payer: Self-pay | Admitting: *Deleted

## 2017-06-23 DIAGNOSIS — J40 Bronchitis, not specified as acute or chronic: Secondary | ICD-10-CM | POA: Diagnosis not present

## 2017-06-23 DIAGNOSIS — J438 Other emphysema: Secondary | ICD-10-CM | POA: Diagnosis not present

## 2017-07-14 DIAGNOSIS — I700 Atherosclerosis of aorta: Secondary | ICD-10-CM | POA: Insufficient documentation

## 2017-07-14 DIAGNOSIS — E785 Hyperlipidemia, unspecified: Secondary | ICD-10-CM | POA: Diagnosis not present

## 2017-07-14 DIAGNOSIS — D692 Other nonthrombocytopenic purpura: Secondary | ICD-10-CM | POA: Diagnosis not present

## 2017-07-14 DIAGNOSIS — Z23 Encounter for immunization: Secondary | ICD-10-CM | POA: Diagnosis not present

## 2017-07-14 DIAGNOSIS — E538 Deficiency of other specified B group vitamins: Secondary | ICD-10-CM | POA: Diagnosis not present

## 2017-07-14 DIAGNOSIS — I10 Essential (primary) hypertension: Secondary | ICD-10-CM | POA: Diagnosis not present

## 2017-07-14 DIAGNOSIS — I2510 Atherosclerotic heart disease of native coronary artery without angina pectoris: Secondary | ICD-10-CM | POA: Diagnosis not present

## 2017-08-30 ENCOUNTER — Emergency Department (HOSPITAL_COMMUNITY): Payer: 59

## 2017-08-30 ENCOUNTER — Other Ambulatory Visit: Payer: Self-pay

## 2017-08-30 ENCOUNTER — Inpatient Hospital Stay (HOSPITAL_COMMUNITY)
Admission: EM | Admit: 2017-08-30 | Discharge: 2017-09-17 | DRG: 207 | Disposition: A | Discharge status: Home / Self Care | Payer: 59 | Attending: General Surgery | Admitting: General Surgery

## 2017-08-30 ENCOUNTER — Encounter (HOSPITAL_COMMUNITY): Payer: Self-pay | Admitting: Emergency Medicine

## 2017-08-30 DIAGNOSIS — R51 Headache: Secondary | ICD-10-CM | POA: Diagnosis not present

## 2017-08-30 DIAGNOSIS — D62 Acute posthemorrhagic anemia: Secondary | ICD-10-CM | POA: Diagnosis not present

## 2017-08-30 DIAGNOSIS — W11XXXA Fall on and from ladder, initial encounter: Secondary | ICD-10-CM | POA: Diagnosis present

## 2017-08-30 DIAGNOSIS — Z978 Presence of other specified devices: Secondary | ICD-10-CM

## 2017-08-30 DIAGNOSIS — F17210 Nicotine dependence, cigarettes, uncomplicated: Secondary | ICD-10-CM | POA: Diagnosis present

## 2017-08-30 DIAGNOSIS — S3991XA Unspecified injury of abdomen, initial encounter: Secondary | ICD-10-CM | POA: Diagnosis not present

## 2017-08-30 DIAGNOSIS — E876 Hypokalemia: Secondary | ICD-10-CM | POA: Diagnosis present

## 2017-08-30 DIAGNOSIS — R079 Chest pain, unspecified: Secondary | ICD-10-CM | POA: Diagnosis not present

## 2017-08-30 DIAGNOSIS — R402252 Coma scale, best verbal response, oriented, at arrival to emergency department: Secondary | ICD-10-CM | POA: Diagnosis present

## 2017-08-30 DIAGNOSIS — S2231XA Fracture of one rib, right side, initial encounter for closed fracture: Secondary | ICD-10-CM

## 2017-08-30 DIAGNOSIS — J439 Emphysema, unspecified: Secondary | ICD-10-CM

## 2017-08-30 DIAGNOSIS — S299XXA Unspecified injury of thorax, initial encounter: Secondary | ICD-10-CM

## 2017-08-30 DIAGNOSIS — Z452 Encounter for adjustment and management of vascular access device: Secondary | ICD-10-CM

## 2017-08-30 DIAGNOSIS — Z885 Allergy status to narcotic agent status: Secondary | ICD-10-CM

## 2017-08-30 DIAGNOSIS — E538 Deficiency of other specified B group vitamins: Secondary | ICD-10-CM | POA: Diagnosis present

## 2017-08-30 DIAGNOSIS — R911 Solitary pulmonary nodule: Secondary | ICD-10-CM | POA: Diagnosis not present

## 2017-08-30 DIAGNOSIS — T797XXA Traumatic subcutaneous emphysema, initial encounter: Secondary | ICD-10-CM | POA: Diagnosis not present

## 2017-08-30 DIAGNOSIS — W19XXXA Unspecified fall, initial encounter: Secondary | ICD-10-CM | POA: Diagnosis not present

## 2017-08-30 DIAGNOSIS — S270XXA Traumatic pneumothorax, initial encounter: Secondary | ICD-10-CM | POA: Diagnosis not present

## 2017-08-30 DIAGNOSIS — S199XXA Unspecified injury of neck, initial encounter: Secondary | ICD-10-CM | POA: Diagnosis not present

## 2017-08-30 DIAGNOSIS — S2241XA Multiple fractures of ribs, right side, initial encounter for closed fracture: Secondary | ICD-10-CM | POA: Diagnosis not present

## 2017-08-30 DIAGNOSIS — G9341 Metabolic encephalopathy: Secondary | ICD-10-CM | POA: Diagnosis present

## 2017-08-30 DIAGNOSIS — J811 Chronic pulmonary edema: Secondary | ICD-10-CM

## 2017-08-30 DIAGNOSIS — R402362 Coma scale, best motor response, obeys commands, at arrival to emergency department: Secondary | ICD-10-CM | POA: Diagnosis present

## 2017-08-30 DIAGNOSIS — S0990XA Unspecified injury of head, initial encounter: Secondary | ICD-10-CM | POA: Diagnosis not present

## 2017-08-30 DIAGNOSIS — J939 Pneumothorax, unspecified: Secondary | ICD-10-CM

## 2017-08-30 DIAGNOSIS — N170 Acute kidney failure with tubular necrosis: Secondary | ICD-10-CM | POA: Diagnosis not present

## 2017-08-30 DIAGNOSIS — Z79899 Other long term (current) drug therapy: Secondary | ICD-10-CM

## 2017-08-30 DIAGNOSIS — R1011 Right upper quadrant pain: Secondary | ICD-10-CM | POA: Diagnosis not present

## 2017-08-30 DIAGNOSIS — S2249XA Multiple fractures of ribs, unspecified side, initial encounter for closed fracture: Secondary | ICD-10-CM | POA: Diagnosis present

## 2017-08-30 DIAGNOSIS — R6521 Severe sepsis with septic shock: Secondary | ICD-10-CM | POA: Diagnosis not present

## 2017-08-30 DIAGNOSIS — K219 Gastro-esophageal reflux disease without esophagitis: Secondary | ICD-10-CM | POA: Diagnosis present

## 2017-08-30 DIAGNOSIS — Z4659 Encounter for fitting and adjustment of other gastrointestinal appliance and device: Secondary | ICD-10-CM

## 2017-08-30 DIAGNOSIS — J9690 Respiratory failure, unspecified, unspecified whether with hypoxia or hypercapnia: Secondary | ICD-10-CM

## 2017-08-30 DIAGNOSIS — Z720 Tobacco use: Secondary | ICD-10-CM

## 2017-08-30 DIAGNOSIS — J810 Acute pulmonary edema: Secondary | ICD-10-CM | POA: Diagnosis not present

## 2017-08-30 DIAGNOSIS — J189 Pneumonia, unspecified organism: Secondary | ICD-10-CM

## 2017-08-30 DIAGNOSIS — R918 Other nonspecific abnormal finding of lung field: Secondary | ICD-10-CM

## 2017-08-30 DIAGNOSIS — A419 Sepsis, unspecified organism: Secondary | ICD-10-CM | POA: Diagnosis not present

## 2017-08-30 DIAGNOSIS — Z888 Allergy status to other drugs, medicaments and biological substances status: Secondary | ICD-10-CM

## 2017-08-30 DIAGNOSIS — I10 Essential (primary) hypertension: Secondary | ICD-10-CM | POA: Diagnosis present

## 2017-08-30 DIAGNOSIS — S0181XA Laceration without foreign body of other part of head, initial encounter: Secondary | ICD-10-CM | POA: Diagnosis present

## 2017-08-30 DIAGNOSIS — R52 Pain, unspecified: Secondary | ICD-10-CM

## 2017-08-30 DIAGNOSIS — Y95 Nosocomial condition: Secondary | ICD-10-CM | POA: Diagnosis not present

## 2017-08-30 DIAGNOSIS — R0682 Tachypnea, not elsewhere classified: Secondary | ICD-10-CM

## 2017-08-30 DIAGNOSIS — I2510 Atherosclerotic heart disease of native coronary artery without angina pectoris: Secondary | ICD-10-CM | POA: Diagnosis present

## 2017-08-30 DIAGNOSIS — S61411A Laceration without foreign body of right hand, initial encounter: Secondary | ICD-10-CM | POA: Diagnosis present

## 2017-08-30 DIAGNOSIS — S2239XA Fracture of one rib, unspecified side, initial encounter for closed fracture: Secondary | ICD-10-CM

## 2017-08-30 DIAGNOSIS — Z7951 Long term (current) use of inhaled steroids: Secondary | ICD-10-CM

## 2017-08-30 DIAGNOSIS — R0602 Shortness of breath: Secondary | ICD-10-CM

## 2017-08-30 DIAGNOSIS — J9601 Acute respiratory failure with hypoxia: Secondary | ICD-10-CM | POA: Diagnosis present

## 2017-08-30 DIAGNOSIS — J449 Chronic obstructive pulmonary disease, unspecified: Secondary | ICD-10-CM | POA: Diagnosis present

## 2017-08-30 DIAGNOSIS — R0603 Acute respiratory distress: Secondary | ICD-10-CM

## 2017-08-30 DIAGNOSIS — Z0189 Encounter for other specified special examinations: Secondary | ICD-10-CM

## 2017-08-30 DIAGNOSIS — R339 Retention of urine, unspecified: Secondary | ICD-10-CM

## 2017-08-30 DIAGNOSIS — D690 Allergic purpura: Secondary | ICD-10-CM | POA: Diagnosis not present

## 2017-08-30 DIAGNOSIS — R402142 Coma scale, eyes open, spontaneous, at arrival to emergency department: Secondary | ICD-10-CM | POA: Diagnosis present

## 2017-08-30 DIAGNOSIS — J690 Pneumonitis due to inhalation of food and vomit: Secondary | ICD-10-CM | POA: Diagnosis not present

## 2017-08-30 LAB — CBC WITH DIFFERENTIAL/PLATELET
BASOS ABS: 0.1 10*3/uL (ref 0.0–0.1)
BASOS PCT: 1 %
EOS ABS: 0.5 10*3/uL (ref 0.0–0.7)
Eosinophils Relative: 3 %
HCT: 39.2 % (ref 39.0–52.0)
HEMOGLOBIN: 14 g/dL (ref 13.0–17.0)
LYMPHS ABS: 2 10*3/uL (ref 0.7–4.0)
Lymphocytes Relative: 12 %
MCH: 29.8 pg (ref 26.0–34.0)
MCHC: 35.7 g/dL (ref 30.0–36.0)
MCV: 83.4 fL (ref 78.0–100.0)
Monocytes Absolute: 0.9 10*3/uL (ref 0.1–1.0)
Monocytes Relative: 5 %
NEUTROS PCT: 79 %
Neutro Abs: 13.1 10*3/uL — ABNORMAL HIGH (ref 1.7–7.7)
Platelets: 209 10*3/uL (ref 150–400)
RBC: 4.7 MIL/uL (ref 4.22–5.81)
RDW: 12.6 % (ref 11.5–15.5)
WBC: 16.5 10*3/uL — AB (ref 4.0–10.5)

## 2017-08-30 LAB — COMPREHENSIVE METABOLIC PANEL
ALK PHOS: 159 U/L — AB (ref 38–126)
ALT: 15 U/L — ABNORMAL LOW (ref 17–63)
ANION GAP: 6 (ref 5–15)
AST: 23 U/L (ref 15–41)
Albumin: 3.5 g/dL (ref 3.5–5.0)
BUN: 6 mg/dL (ref 6–20)
CALCIUM: 8.5 mg/dL — AB (ref 8.9–10.3)
CO2: 25 mmol/L (ref 22–32)
Chloride: 103 mmol/L (ref 101–111)
Creatinine, Ser: 0.98 mg/dL (ref 0.61–1.24)
GFR calc non Af Amer: >60 mL/min (ref >60)
Glucose, Bld: 117 mg/dL — ABNORMAL HIGH (ref 65–99)
Potassium: 3.2 mmol/L — ABNORMAL LOW (ref 3.5–5.1)
SODIUM: 134 mmol/L — AB (ref 135–145)
Total Bilirubin: 0.8 mg/dL (ref 0.3–1.2)
Total Protein: 6.3 g/dL — ABNORMAL LOW (ref 6.5–8.1)

## 2017-08-30 LAB — I-STAT CG4 LACTIC ACID, ED: LACTIC ACID, VENOUS: 1.15 mmol/L (ref 0.5–1.9)

## 2017-08-30 LAB — LIPASE, BLOOD: LIPASE: 32 U/L (ref 11–51)

## 2017-08-30 MED ORDER — MOMETASONE FURO-FORMOTEROL FUM 200-5 MCG/ACT IN AERO
2.0000 | INHALATION_SPRAY | Freq: Two times a day (BID) | RESPIRATORY_TRACT | Status: DC
  Administered 2017-08-31 – 2017-09-01 (×3): 2 via RESPIRATORY_TRACT
  Filled 2017-08-30 (×2): qty 8.8

## 2017-08-30 MED ORDER — DIPHENHYDRAMINE HCL 50 MG/ML IJ SOLN
25.0000 mg | Freq: Once | INTRAMUSCULAR | Status: AC
  Administered 2017-08-30: 25 mg via INTRAVENOUS
  Filled 2017-08-30: qty 1

## 2017-08-30 MED ORDER — ONDANSETRON HCL 4 MG/2ML IJ SOLN
4.0000 mg | Freq: Four times a day (QID) | INTRAMUSCULAR | Status: DC | PRN
  Administered 2017-09-15: 4 mg via INTRAVENOUS
  Filled 2017-08-30: qty 2

## 2017-08-30 MED ORDER — OXYCODONE HCL 5 MG PO TABS
5.0000 mg | ORAL_TABLET | ORAL | Status: DC | PRN
Start: 2017-08-30 — End: 2017-08-31
  Administered 2017-08-31 (×3): 5 mg via ORAL
  Filled 2017-08-30 (×3): qty 1

## 2017-08-30 MED ORDER — HYDROMORPHONE HCL 1 MG/ML IJ SOLN
1.0000 mg | INTRAMUSCULAR | Status: DC | PRN
  Administered 2017-08-31 (×6): 1 mg via INTRAVENOUS
  Filled 2017-08-30 (×7): qty 1

## 2017-08-30 MED ORDER — MORPHINE SULFATE (PF) 4 MG/ML IV SOLN
INTRAVENOUS | Status: AC
  Administered 2017-08-30: 4 mg via INTRAVENOUS
  Filled 2017-08-30: qty 1

## 2017-08-30 MED ORDER — KETOROLAC TROMETHAMINE 30 MG/ML IJ SOLN
30.0000 mg | Freq: Four times a day (QID) | INTRAMUSCULAR | Status: DC | PRN
  Administered 2017-08-31 (×2): 30 mg via INTRAVENOUS
  Filled 2017-08-30 (×2): qty 1

## 2017-08-30 MED ORDER — TETANUS-DIPHTH-ACELL PERTUSSIS 5-2.5-18.5 LF-MCG/0.5 IM SUSP
0.5000 mL | Freq: Once | INTRAMUSCULAR | Status: AC
  Administered 2017-08-30: 0.5 mL via INTRAMUSCULAR
  Filled 2017-08-30: qty 0.5

## 2017-08-30 MED ORDER — DEXTROSE-NACL 5-0.9 % IV SOLN
INTRAVENOUS | Status: DC
  Administered 2017-08-31: 03:00:00 via INTRAVENOUS

## 2017-08-30 MED ORDER — POTASSIUM CHLORIDE 10 MEQ/100ML IV SOLN
10.0000 meq | INTRAVENOUS | Status: AC
  Administered 2017-08-31 (×2): 10 meq via INTRAVENOUS
  Filled 2017-08-30: qty 100

## 2017-08-30 MED ORDER — TRAMADOL HCL 50 MG PO TABS: 50.0000 mg | ORAL_TABLET | Freq: Four times a day (QID) | ORAL | Status: DC | PRN

## 2017-08-30 MED ORDER — MORPHINE SULFATE (PF) 4 MG/ML IV SOLN
4.0000 mg | INTRAVENOUS | Status: AC | PRN
  Administered 2017-08-30 (×4): 4 mg via INTRAVENOUS
  Filled 2017-08-30 (×3): qty 1

## 2017-08-30 MED ORDER — TIOTROPIUM BROMIDE MONOHYDRATE 18 MCG IN CAPS
18.0000 ug | ORAL_CAPSULE | Freq: Every day | RESPIRATORY_TRACT | Status: DC
  Administered 2017-08-31 – 2017-09-01 (×2): 18 ug via RESPIRATORY_TRACT
  Filled 2017-08-30 (×2): qty 5

## 2017-08-30 MED ORDER — LOSARTAN POTASSIUM 50 MG PO TABS
50.0000 mg | ORAL_TABLET | Freq: Every day | ORAL | Status: DC
  Administered 2017-08-31: 50 mg via ORAL
  Filled 2017-08-30 (×2): qty 1

## 2017-08-30 MED ORDER — IOPAMIDOL (ISOVUE-300) INJECTION 61%
INTRAVENOUS | Status: AC
  Administered 2017-08-30: 100 mL
  Filled 2017-08-30: qty 100

## 2017-08-30 MED ORDER — ALBUTEROL SULFATE (2.5 MG/3ML) 0.083% IN NEBU
3.0000 mL | INHALATION_SOLUTION | Freq: Four times a day (QID) | RESPIRATORY_TRACT | Status: DC | PRN
  Administered 2017-08-31: 3 mL via RESPIRATORY_TRACT
  Filled 2017-08-30: qty 3

## 2017-08-30 MED ORDER — PANTOPRAZOLE SODIUM 40 MG PO TBEC
40.0000 mg | DELAYED_RELEASE_TABLET | Freq: Every day | ORAL | Status: DC
  Administered 2017-08-31 – 2017-09-01 (×2): 40 mg via ORAL
  Filled 2017-08-30 (×2): qty 1

## 2017-08-30 MED ORDER — ENOXAPARIN SODIUM 40 MG/0.4ML ~~LOC~~ SOLN
40.0000 mg | SUBCUTANEOUS | Status: DC
  Administered 2017-08-31 – 2017-09-04 (×5): 40 mg via SUBCUTANEOUS
  Filled 2017-08-30 (×5): qty 0.4

## 2017-08-30 MED ORDER — ONDANSETRON 4 MG PO TBDP: 4.0000 mg | ORAL_TABLET | Freq: Four times a day (QID) | ORAL | Status: DC | PRN

## 2017-08-30 NOTE — ED Triage Notes (Signed)
Per [facility]: Pt to ED following fall from ladder about 8 feet off the ground while trying to put up Christmas lights - states he mis-stepped and fell, hitting the ladder and then landing on the back deck. C/o R rib pain and mild headache - abrasions to posterior head and above L eye, bleeding controlled, minor hematoma to back of head. Patient denies LOC, A&O x 4. Denies n/v, no dizziness/lightheadedness. No obvious deformity noted, no crepitus. Lung sounds clear bilaterally. Resp e/u, skin warm/dry. PERRL. 16g. LFA, given 169mcg fentanyl PTA.

## 2017-08-30 NOTE — ED Provider Notes (Signed)
[facility] EMERGENCY DEPARTMENT Provider Note   CSN: [id number] Arrival date & time: 08/30/17  1757     History   Chief Complaint Chief Complaint  Patient presents with  . Fall    HPI Derrick Sheppard is a 61 y.o. male.  The history is provided by the patient.  He fell off of a ladder falling an estimated 8 feet.  He landed on the ladder.  Impact was with his right posterior lateral rib cage.  He is complaining of pain in that area.  He received fentanyl in the ambulance coming in, and states that pain is much improved.  He did suffer a laceration/abrasion to his forehead and occiput as well as to his right hand.  He denies loss of consciousness and his wife states that he has been coherent throughout.  He takes low-dose aspirin, but no other antiplatelet or anticoagulant medication.  Past Medical History:  Diagnosis Date  . Allergic state   . Arthritis   . Asthma   . B12 deficiency   . COPD (chronic obstructive pulmonary disease) ([place])   . Coronary artery disease   . Dysrhythmia   . Edema   . GERD (gastroesophageal reflux disease)   . Headache   . Palpitations   . Pulmonary nodules     There are no active problems to display for this patient.   Past Surgical History:  Procedure Laterality Date  . COLONOSCOPY    . COLONOSCOPY WITH PROPOFOL N/A 04/09/2016   Procedure: COLONOSCOPY WITH PROPOFOL;  Surgeon: Lollie Sails, MD;  Location: [facility] ENDOSCOPY;  Service: Endoscopy;  Laterality: N/A;       Home Medications    Prior to Admission medications   Medication Sig Start Date End Date Taking? Authorizing Provider  albuterol (PROVENTIL HFA;VENTOLIN HFA) 108 (90 Base) MCG/ACT inhaler Inhale 2 puffs into the lungs every 6 (six) hours as needed for wheezing or shortness of breath.    [provider]  B Complex-C (B-COMPLEX WITH VITAMIN C) tablet Take 1 tablet by mouth daily.    [provider]  Cholecalciferol 1000 units TBDP  Take 1,000 Units/day by mouth daily.    [provider]  esomeprazole (NEXIUM) 40 MG capsule Take 40 mg by mouth daily at 12 noon.    [provider]  Fluticasone-Salmeterol (ADVAIR) 250-50 MCG/DOSE AEPB Inhale 1 puff into the lungs 2 (two) times daily.    [provider]  ibuprofen (ADVIL,MOTRIN) 200 MG tablet Take 200 mg by mouth every 4 (four) hours as needed.    [provider]  losartan (COZAAR) 50 MG tablet Take 50 mg by mouth daily.    [provider]  tiotropium (SPIRIVA) 18 MCG inhalation capsule Place 18 mcg into inhaler and inhale daily.    [provider]    Family History No family history on file.  Social History Social History   Tobacco Use  . Smoking status: Current Every Day Smoker    Packs/day: 1.00    Years: 42.00    Pack years: 42.00    Types: Cigarettes  . Smokeless tobacco: Never Used  Substance Use Topics  . Alcohol use: No  . Drug use: No     Allergies   Amlodipine and Codeine   Review of Systems Review of Systems  All other systems reviewed and are negative.    Physical Exam Updated Vital Signs BP (!) 142/91 (BP Location: Right Arm)   Pulse 77   Temp 98.9  F (37.2 C) (Oral)   Resp 20   Ht 5\' 8"  (1.727 m)   Wt 74.8 kg (165 lb)   SpO2 95%   BMI 25.09 kg/m   Physical Exam  Nursing note and vitals reviewed.  61 year old male, resting comfortably and in no acute distress. Vital signs are significant for borderline hypertension. Oxygen saturation is 95%, which is normal. Head is normocephalic.  Abrasion is seen going through the left eyebrow, and abrasion/laceration present on the occiput. PERRLA, EOMI. Oropharynx is clear. Neck is nontender without adenopathy or JVD. Back is nontender and there is no CVA tenderness. Lungs are clear without rales, wheezes, or rhonchi. Chest is moderately tender over the right lateral and posterior lateral rib cage without crepitus. Heart has regular  rate and rhythm without murmur. Abdomen is soft, flat, with moderate tenderness in the right upper quadrant and right flank area.  There are no masses or hepatosplenomegaly and peristalsis is normoactive.  Pelvis is stable and nontender. Extremities have no cyanosis or edema, full range of motion is present.  Abrasion present over the dorsum of the right hand. Skin is warm and dry without rash. Neurologic: Mental status is normal, cranial nerves are intact, there are no motor or sensory deficits.  ED Treatments / Results  Labs (all labs ordered are listed, but only abnormal results are displayed) Labs Reviewed  COMPREHENSIVE METABOLIC PANEL - Abnormal; Notable for the following components:      Result Value   Sodium 134 (*)    Potassium 3.2 (*)    Glucose, Bld 117 (*)    Calcium 8.5 (*)    Total Protein 6.3 (*)    ALT 15 (*)    Alkaline Phosphatase 159 (*)    All other components within normal limits  CBC WITH DIFFERENTIAL/PLATELET - Abnormal; Notable for the following components:   WBC 16.5 (*)    Neutro Abs 13.1 (*)    All other components within normal limits  LIPASE, BLOOD  I-STAT CG4 LACTIC ACID, ED  I-STAT CG4 LACTIC ACID, ED   Radiology Ct Head Wo Contrast  Result Date: 08/30/2017 CLINICAL DATA:  [place] 10 feet off of a roof, cervical spine trauma high clinical risk, minor head trauma, GCS greater than or equal 13, complaining of pain EXAM: CT HEAD WITHOUT CONTRAST CT CERVICAL SPINE WITHOUT CONTRAST TECHNIQUE: Multidetector CT imaging of the head and cervical spine was performed following the standard protocol without intravenous contrast. Multiplanar CT image reconstructions of the cervical spine were also generated. COMPARISON:  CT head 10/23/2009 FINDINGS: CT HEAD FINDINGS Brain: Generalized atrophy. Normal ventricular morphology. No midline shift or mass effect. Minimal small vessel chronic ischemic changes of deep cerebral white matter. No intracranial hemorrhage, mass  lesion, or evidence acute infarction. No extra-axial fluid collections. Vascular: Atherosclerotic calcifications of internal carotid arteries bilaterally at skullbase Skull: Intact Sinuses/Orbits: Partial opacification of RIGHT sphenoid sinus slightly increased since previous exam question mucosal retention cyst. Remaining visualized paranasal sinuses and mastoid air cells clear Other: N/A CT CERVICAL SPINE FINDINGS Alignment: Minimal retrolisthesis at C3-C4. Alignment otherwise normal. Skull base and vertebrae: Visualized skullbase intact. 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 Osseous mineralization normal. Vertebral body heights maintained without fracture or subluxation. No bone destruction. Dextroconvex cervical scoliosis. Mild scattered facet degenerative changes. Soft tissues and spinal canal: Prevertebral soft tissues normal thickness. 9 mm RIGHT thyroid nodule. Remaining visualized cervical soft tissues unremarkable. Incidentally noted atherosclerotic calcifications of the carotid bifurcations. Disc  levels: Disc space narrowing C3-C4. No definite spinal stenosis. Upper chest: Lung apices clear Other: N/A IMPRESSION: Atrophy with minimal small vessel chronic ischemic changes of deep cerebral white matter. No acute intracranial abnormalities. Congenital fusion C4-C5. No acute cervical spine abnormalities. Electronically Signed   By: Lavonia Dana M.D.   On: 08/30/2017 21:33   Ct Chest W Contrast  Result Date: 08/30/2017 CLINICAL DATA:  [place] 10 foot from a roof today, complaining of RIGHT rib pain, history asthma, COPD, coronary artery disease EXAM: CT CHEST, ABDOMEN, AND PELVIS WITH CONTRAST TECHNIQUE: Multidetector CT imaging of the chest, abdomen and pelvis was performed following the standard protocol during bolus administration of intravenous contrast. Sagittal and coronal MPR images reconstructed from axial data set. CONTRAST:  100 mL ISOVUE-300 IOPAMIDOL  (ISOVUE-300) INJECTION 61% IV. No oral contrast. COMPARISON:  CT chest 05/26/2017 FINDINGS: CT CHEST FINDINGS Cardiovascular: Atherosclerotic calcifications aorta, proximal great vessels and coronary arteries. Aorta normal caliber without aneurysm or peri-aortic infiltration/hemorrhage. No pericardial effusion. Pulmonary arteries grossly patent on nondedicated exam. Mediastinum/Nodes: Base of cervical region normal appearance. Few normal size mediastinal and hilar lymph nodes without thoracic adenopathy. Esophagus unremarkable. Lungs/Pleura: Dependent atelectasis in BILATERAL lower lobes. 3 mm RIGHT lung nodule image 32. Emphysematous changes greatest at apices. Small slightly irregular nodular density RIGHT upper lobe 8 mm diameter image 63. Remaining lungs clear. Scattered tiny foci of pneumothorax in the RIGHT hemithorax. Musculoskeletal: Fractures of the RIGHT eighth, ninth, tenth, and eleventh ribs. Foci of chest wall pneumatosis adjacent to rib fractures. CT ABDOMEN PELVIS FINDINGS Hepatobiliary: Liver normal appearance. Dependent calcified gallstone in gallbladder Pancreas: Normal appearance Spleen: Normal appearance Adrenals/Urinary Tract: Adrenal glands normal appearance. Kidneys, ureters and bladder normal appearance. Stomach/Bowel: Normal appendix. Stomach and bowel loops normal appearance. Vascular/Lymphatic: Atherosclerotic calcifications of aorta and iliac arteries without aneurysm. Calcified plaque at proximal SMA. No adenopathy. Reproductive: Prostatic enlargement, gland measuring 5.5 x 4.3 x 4.6 cm (volume = 57 cm^3). Seminal vesicles unremarkable. Other: Small BILATERAL inguinal hernias containing fat larger on RIGHT. No free intraperitoneal air or fluid. Musculoskeletal: Demineralized without acute abnormalities. IMPRESSION: Fractures of the RIGHT eighth ninth tenth and eleventh ribs. Dependent atelectasis in BILATERAL lower lobes. Tiny foci of RIGHT pneumothorax. No acute intra-abdominal or  intrapelvic abnormalities. Prostatic enlargement. Small BILATERAL inguinal hernias. 3 mm nonspecific RIGHT lung nodule with an additional slightly irregular 8 mm diameter nodular density in the RIGHT upper lobe question nodule versus scarring ; recommendation below. Non-contrast chest CT at 3-6 months is recommended. If the nodules are stable at time of repeat CT, then future CT at 18-24 months (from today's scan) is considered optional for low-risk patients, but is recommended for high-risk patients. This recommendation follows the consensus statement: Guidelines for Management of Incidental Pulmonary Nodules Detected on CT Images: From the Fleischner Society 2017; Radiology 2017; 284:228-243. Aortic Atherosclerosis (ICD10-I70.0) and Emphysema (ICD10-J43.9). Findings called to Dr. Roxanne Mins On 38/25/0539 at 2152 hours. Electronically Signed   By: Lavonia Dana M.D.   On: 08/30/2017 21:54   Ct Cervical Spine Wo Contrast  Result Date: 08/30/2017 CLINICAL DATA:  [place] 10 feet off of a roof, cervical spine trauma high clinical risk, minor head trauma, GCS greater than or equal 13, complaining of pain EXAM: CT HEAD WITHOUT CONTRAST CT CERVICAL SPINE WITHOUT CONTRAST TECHNIQUE: Multidetector CT imaging of the head and cervical spine was performed following the standard protocol without intravenous contrast. Multiplanar CT image reconstructions of the cervical spine were also generated. COMPARISON:  CT head 10/23/2009 FINDINGS:  CT HEAD FINDINGS Brain: Generalized atrophy. Normal ventricular morphology. No midline shift or mass effect. Minimal small vessel chronic ischemic changes of deep cerebral white matter. No intracranial hemorrhage, mass lesion, or evidence acute infarction. No extra-axial fluid collections. Vascular: Atherosclerotic calcifications of internal carotid arteries bilaterally at skullbase Skull: Intact Sinuses/Orbits: Partial opacification of RIGHT sphenoid sinus slightly increased since previous exam  question mucosal retention cyst. Remaining visualized paranasal sinuses and mastoid air cells clear Other: N/A CT CERVICAL SPINE FINDINGS Alignment: Minimal retrolisthesis at C3-C4. Alignment otherwise normal. Skull base and vertebrae: Visualized skullbase intact. 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 Osseous mineralization normal. Vertebral body heights maintained without fracture or subluxation. No bone destruction. Dextroconvex cervical scoliosis. Mild scattered facet degenerative changes. Soft tissues and spinal canal: Prevertebral soft tissues normal thickness. 9 mm RIGHT thyroid nodule. Remaining visualized cervical soft tissues unremarkable. Incidentally noted atherosclerotic calcifications of the carotid bifurcations. Disc levels: Disc space narrowing C3-C4. No definite spinal stenosis. Upper chest: Lung apices clear Other: N/A IMPRESSION: Atrophy with minimal small vessel chronic ischemic changes of deep cerebral white matter. No acute intracranial abnormalities. Congenital fusion C4-C5. No acute cervical spine abnormalities. Electronically Signed   By: Lavonia Dana M.D.   On: 08/30/2017 21:33   Ct Abdomen Pelvis W Contrast  Result Date: 08/30/2017 CLINICAL DATA:  [place] 10 foot from a roof today, complaining of RIGHT rib pain, history asthma, COPD, coronary artery disease EXAM: CT CHEST, ABDOMEN, AND PELVIS WITH CONTRAST TECHNIQUE: Multidetector CT imaging of the chest, abdomen and pelvis was performed following the standard protocol during bolus administration of intravenous contrast. Sagittal and coronal MPR images reconstructed from axial data set. CONTRAST:  100 mL ISOVUE-300 IOPAMIDOL (ISOVUE-300) INJECTION 61% IV. No oral contrast. COMPARISON:  CT chest 05/26/2017 FINDINGS: CT CHEST FINDINGS Cardiovascular: Atherosclerotic calcifications aorta, proximal great vessels and coronary arteries. Aorta normal caliber without aneurysm or peri-aortic  infiltration/hemorrhage. No pericardial effusion. Pulmonary arteries grossly patent on nondedicated exam. Mediastinum/Nodes: Base of cervical region normal appearance. Few normal size mediastinal and hilar lymph nodes without thoracic adenopathy. Esophagus unremarkable. Lungs/Pleura: Dependent atelectasis in BILATERAL lower lobes. 3 mm RIGHT lung nodule image 32. Emphysematous changes greatest at apices. Small slightly irregular nodular density RIGHT upper lobe 8 mm diameter image 63. Remaining lungs clear. Scattered tiny foci of pneumothorax in the RIGHT hemithorax. Musculoskeletal: Fractures of the RIGHT eighth, ninth, tenth, and eleventh ribs. Foci of chest wall pneumatosis adjacent to rib fractures. CT ABDOMEN PELVIS FINDINGS Hepatobiliary: Liver normal appearance. Dependent calcified gallstone in gallbladder Pancreas: Normal appearance Spleen: Normal appearance Adrenals/Urinary Tract: Adrenal glands normal appearance. Kidneys, ureters and bladder normal appearance. Stomach/Bowel: Normal appendix. Stomach and bowel loops normal appearance. Vascular/Lymphatic: Atherosclerotic calcifications of aorta and iliac arteries without aneurysm. Calcified plaque at proximal SMA. No adenopathy. Reproductive: Prostatic enlargement, gland measuring 5.5 x 4.3 x 4.6 cm (volume = 57 cm^3). Seminal vesicles unremarkable. Other: Small BILATERAL inguinal hernias containing fat larger on RIGHT. No free intraperitoneal air or fluid. Musculoskeletal: Demineralized without acute abnormalities. IMPRESSION: Fractures of the RIGHT eighth ninth tenth and eleventh ribs. Dependent atelectasis in BILATERAL lower lobes. Tiny foci of RIGHT pneumothorax. No acute intra-abdominal or intrapelvic abnormalities. Prostatic enlargement. Small BILATERAL inguinal hernias. 3 mm nonspecific RIGHT lung nodule with an additional slightly irregular 8 mm diameter nodular density in the RIGHT upper lobe question nodule versus scarring ; recommendation below.  Non-contrast chest CT at 3-6 months is recommended. If the nodules are stable at time of repeat  CT, then future CT at 18-24 months (from today's scan) is considered optional for low-risk patients, but is recommended for high-risk patients. This recommendation follows the consensus statement: Guidelines for Management of Incidental Pulmonary Nodules Detected on CT Images: From the Fleischner Society 2017; Radiology 2017; 284:228-243. Aortic Atherosclerosis (ICD10-I70.0) and Emphysema (ICD10-J43.9). Findings called to Dr. Roxanne Mins On 16/07/9603 at 2152 hours. Electronically Signed   By: Lavonia Dana M.D.   On: 08/30/2017 21:54  I have personally discussed the findings with Dr. Darcella Cheshire, including the need to compare with prior CT scans of chest to see if nodules are old.  Dr. Darcella Cheshire will issue an addendum regarding whether the nodules have shown any change in size.  Procedures Procedures (including critical care time) CRITICAL CARE Performed by: Delora Fuel Total critical care time: 50 minutes Critical care time was exclusive of separately billable procedures and treating other patients. Critical care was necessary to treat or prevent imminent or life-threatening deterioration. Critical care was time spent personally by me on the following activities: development of treatment plan with patient and/or surrogate as well as nursing, discussions with consultants, evaluation of patient's response to treatment, examination of patient, obtaining history from patient or surrogate, ordering and performing treatments and interventions, ordering and review of laboratory studies, ordering and review of radiographic studies, pulse oximetry and re-evaluation of patient's condition.  Medications Ordered in ED Medications  Tdap (BOOSTRIX) injection 0.5 mL (0.5 mLs Intramuscular Given 08/30/17 1853)  morphine 4 MG/ML injection 4 mg (4 mg Intravenous Given 08/30/17 2147)  diphenhydrAMINE (BENADRYL) injection 25 mg (25 mg  Intravenous Given 08/30/17 1938)  iopamidol (ISOVUE-300) 61 % injection (100 mLs  Contrast Given 08/30/17 2054)     Initial Impression / Assessment and Plan / ED Course  I have reviewed the triage vital signs and the nursing notes.  Pertinent labs & imaging results that were available during my care of the patient were reviewed by me and considered in my medical decision making (see chart for details).  Fall with injury to right chest wall.  Also, minor injury to the head.  Based on mechanism of injury, he will be sent for CT of head and cervical spine.  Because of tenderness which extends into the abdomen, will send for CT of chest, abdomen, pelvis.  Tdap booster is given.  CT scans show 4 rib fractures on the right with very small pneumothorax and underlying pulmonary contusion.  Incidental finding of 2 lung nodules.  I have discussed these findings with patient and family.  He has been followed by his PCP with annual CT scans because of known lung nodule.  I have discussed this with the radiologist who has gone back and will assess whether the current nodules were present prior to today, and if there has been any change.  Case is discussed with Dr. Brantley Stage of trauma surgery service, who agrees to admit the patient.  Final Clinical Impressions(s) / ED Diagnoses   Final diagnoses:  None    ED Discharge Orders    None       Delora Fuel, MD 54/09/81 2240

## 2017-08-30 NOTE — H&P (Addendum)
Derrick Sheppard is an 61 y.o. male.   Chief Complaint: fall HPI: Asked to see patient after he fell off ladder 10 ft onto his right side.  Worked up in ED and has multiple right rib fractures and trace PTX.  Complains of right sided chest pain with deep inspiration.  Denis back, neck, abdominal or extremity pain.    Past Medical History:  Diagnosis Date  . Allergic state   . Arthritis   . Asthma   . B12 deficiency   . COPD (chronic obstructive pulmonary disease) ([place])   . Coronary artery disease   . Dysrhythmia   . Edema   . GERD (gastroesophageal reflux disease)   . Headache   . Palpitations   . Pulmonary nodules     Past Surgical History:  Procedure Laterality Date  . COLONOSCOPY    . COLONOSCOPY WITH PROPOFOL N/A 04/09/2016   Procedure: COLONOSCOPY WITH PROPOFOL;  Surgeon: Lollie Sails, MD;  Location: [facility] ENDOSCOPY;  Service: Endoscopy;  Laterality: N/A;    No family history on file. Social History:  reports that he has been smoking cigarettes.  He has a 42.00 pack-year smoking history. he has never used smokeless tobacco. He reports that he does not drink alcohol or use drugs.  Allergies:  Allergies  Allergen Reactions  . Amlodipine Swelling    Makes his ankles and legs swell.  . Codeine Other (See Comments)    Gives him a Headache.     (Not in a hospital admission)  Results for orders placed or performed during the hospital encounter of 08/30/17 (from the past 48 hour(s))  Comprehensive metabolic panel     Status: Abnormal   Collection Time: 08/30/17  6:35 PM  Result Value Ref Range   Sodium 134 (L) 135 - 145 mmol/L   Potassium 3.2 (L) 3.5 - 5.1 mmol/L   Chloride 103 101 - 111 mmol/L   CO2 25 22 - 32 mmol/L   Glucose, Bld 117 (H) 65 - 99 mg/dL   BUN 6 6 - 20 mg/dL   Creatinine, Ser 0.98 0.61 - 1.24 mg/dL   Calcium 8.5 (L) 8.9 - 10.3 mg/dL   Total Protein 6.3 (L) 6.5 - 8.1 g/dL   Albumin 3.5 3.5 - 5.0 g/dL   AST 23 15 - 41 U/L   ALT 15 (L) 17 - 63  U/L   Alkaline Phosphatase 159 (H) 38 - 126 U/L   Total Bilirubin 0.8 0.3 - 1.2 mg/dL   GFR calc non Af Amer >60 >60 mL/min   GFR calc Af Amer >60 >60 mL/min    Comment: (NOTE) The eGFR has been calculated using the CKD EPI equation. This calculation has not been validated in all clinical situations. eGFR's persistently <60 mL/min signify possible Chronic Kidney Disease.    Anion gap 6 5 - 15  Lipase, blood     Status: None   Collection Time: 08/30/17  6:35 PM  Result Value Ref Range   Lipase 32 11 - 51 U/L  CBC with Differential     Status: Abnormal   Collection Time: 08/30/17  6:35 PM  Result Value Ref Range   WBC 16.5 (H) 4.0 - 10.5 K/uL   RBC 4.70 4.22 - 5.81 MIL/uL   Hemoglobin 14.0 13.0 - 17.0 g/dL   HCT 39.2 39.0 - 52.0 %   MCV 83.4 78.0 - 100.0 fL   MCH 29.8 26.0 - 34.0 pg   MCHC 35.7 30.0 - 36.0 g/dL  RDW 12.6 11.5 - 15.5 %   Platelets 209 150 - 400 K/uL   Neutrophils Relative % 79 %   Neutro Abs 13.1 (H) 1.7 - 7.7 K/uL   Lymphocytes Relative 12 %   Lymphs Abs 2.0 0.7 - 4.0 K/uL   Monocytes Relative 5 %   Monocytes Absolute 0.9 0.1 - 1.0 K/uL   Eosinophils Relative 3 %   Eosinophils Absolute 0.5 0.0 - 0.7 K/uL   Basophils Relative 1 %   Basophils Absolute 0.1 0.0 - 0.1 K/uL  I-Stat CG4 Lactic Acid, ED     Status: None   Collection Time: 08/30/17  6:58 PM  Result Value Ref Range   Lactic Acid, Venous 1.15 0.5 - 1.9 mmol/L   Ct Head Wo Contrast  Result Date: 08/30/2017 CLINICAL DATA:  [place] 10 feet off of a roof, cervical spine trauma high clinical risk, minor head trauma, GCS greater than or equal 13, complaining of pain EXAM: CT HEAD WITHOUT CONTRAST CT CERVICAL SPINE WITHOUT CONTRAST TECHNIQUE: Multidetector CT imaging of the head and cervical spine was performed following the standard protocol without intravenous contrast. Multiplanar CT image reconstructions of the cervical spine were also generated. COMPARISON:  CT head 10/23/2009 FINDINGS: CT HEAD FINDINGS  Brain: Generalized atrophy. Normal ventricular morphology. No midline shift or mass effect. Minimal small vessel chronic ischemic changes of deep cerebral white matter. No intracranial hemorrhage, mass lesion, or evidence acute infarction. No extra-axial fluid collections. Vascular: Atherosclerotic calcifications of internal carotid arteries bilaterally at skullbase Skull: Intact Sinuses/Orbits: Partial opacification of RIGHT sphenoid sinus slightly increased since previous exam question mucosal retention cyst. Remaining visualized paranasal sinuses and mastoid air cells clear Other: N/A CT CERVICAL SPINE FINDINGS Alignment: Minimal retrolisthesis at C3-C4. Alignment otherwise normal. Skull base and vertebrae: Visualized skullbase intact. 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 Osseous mineralization normal. Vertebral body heights maintained without fracture or subluxation. No bone destruction. Dextroconvex cervical scoliosis. Mild scattered facet degenerative changes. Soft tissues and spinal canal: Prevertebral soft tissues normal thickness. 9 mm RIGHT thyroid nodule. Remaining visualized cervical soft tissues unremarkable. Incidentally noted atherosclerotic calcifications of the carotid bifurcations. Disc levels: Disc space narrowing C3-C4. No definite spinal stenosis. Upper chest: Lung apices clear Other: N/A IMPRESSION: Atrophy with minimal small vessel chronic ischemic changes of deep cerebral white matter. No acute intracranial abnormalities. Congenital fusion C4-C5. No acute cervical spine abnormalities. Electronically Signed   By: Lavonia Dana M.D.   On: 08/30/2017 21:33   Ct Chest W Contrast  Addendum Date: 08/30/2017   ADDENDUM REPORT: 08/30/2017 22:18 ADDENDUM: Comparison is made to multiple prior chest CTs, ranging from 05/26/2017 back to 01/16/2014. The spiculated 8 mm diameter nodular density does not appear significantly changed versus the earlier study of  05/26/2017. When reviewed back to earlier exams, this area of questioned nodularity has significantly decreased in size since 01/16/2014 indicating this is most likely representing scarring. The second 3 mm nodular density in the lateral mid RIGHT lung has not significantly changed since 10/30/2014, also favoring a benign etiology. Per the recommendation of the most recent screening chest CT from August 2018, recommend continued annual low-dose screening CT chest to demonstrate continued stability of the slightly irregular 8 mm nodular density. Electronically Signed   By: Lavonia Dana M.D.   On: 08/30/2017 22:18   Result Date: 08/30/2017 CLINICAL DATA:  [place] 10 foot from a roof today, complaining of RIGHT rib pain, history asthma, COPD, coronary artery disease EXAM: CT CHEST,  ABDOMEN, AND PELVIS WITH CONTRAST TECHNIQUE: Multidetector CT imaging of the chest, abdomen and pelvis was performed following the standard protocol during bolus administration of intravenous contrast. Sagittal and coronal MPR images reconstructed from axial data set. CONTRAST:  100 mL ISOVUE-300 IOPAMIDOL (ISOVUE-300) INJECTION 61% IV. No oral contrast. COMPARISON:  CT chest 05/26/2017 FINDINGS: CT CHEST FINDINGS Cardiovascular: Atherosclerotic calcifications aorta, proximal great vessels and coronary arteries. Aorta normal caliber without aneurysm or peri-aortic infiltration/hemorrhage. No pericardial effusion. Pulmonary arteries grossly patent on nondedicated exam. Mediastinum/Nodes: Base of cervical region normal appearance. Few normal size mediastinal and hilar lymph nodes without thoracic adenopathy. Esophagus unremarkable. Lungs/Pleura: Dependent atelectasis in BILATERAL lower lobes. 3 mm RIGHT lung nodule image 32. Emphysematous changes greatest at apices. Small slightly irregular nodular density RIGHT upper lobe 8 mm diameter image 63. Remaining lungs clear. Scattered tiny foci of pneumothorax in the RIGHT hemithorax.  Musculoskeletal: Fractures of the RIGHT eighth, ninth, tenth, and eleventh ribs. Foci of chest wall pneumatosis adjacent to rib fractures. CT ABDOMEN PELVIS FINDINGS Hepatobiliary: Liver normal appearance. Dependent calcified gallstone in gallbladder Pancreas: Normal appearance Spleen: Normal appearance Adrenals/Urinary Tract: Adrenal glands normal appearance. Kidneys, ureters and bladder normal appearance. Stomach/Bowel: Normal appendix. Stomach and bowel loops normal appearance. Vascular/Lymphatic: Atherosclerotic calcifications of aorta and iliac arteries without aneurysm. Calcified plaque at proximal SMA. No adenopathy. Reproductive: Prostatic enlargement, gland measuring 5.5 x 4.3 x 4.6 cm (volume = 57 cm^3). Seminal vesicles unremarkable. Other: Small BILATERAL inguinal hernias containing fat larger on RIGHT. No free intraperitoneal air or fluid. Musculoskeletal: Demineralized without acute abnormalities. IMPRESSION: Fractures of the RIGHT eighth ninth tenth and eleventh ribs. Dependent atelectasis in BILATERAL lower lobes. Tiny foci of RIGHT pneumothorax. No acute intra-abdominal or intrapelvic abnormalities. Prostatic enlargement. Small BILATERAL inguinal hernias. 3 mm nonspecific RIGHT lung nodule with an additional slightly irregular 8 mm diameter nodular density in the RIGHT upper lobe question nodule versus scarring ; recommendation below. Non-contrast chest CT at 3-6 months is recommended. If the nodules are stable at time of repeat CT, then future CT at 18-24 months (from today's scan) is considered optional for low-risk patients, but is recommended for high-risk patients. This recommendation follows the consensus statement: Guidelines for Management of Incidental Pulmonary Nodules Detected on CT Images: From the Fleischner Society 2017; Radiology 2017; 284:228-243. Aortic Atherosclerosis (ICD10-I70.0) and Emphysema (ICD10-J43.9). Findings called to Dr. Roxanne Mins On 93/26/7124 at 2152 hours. Electronically  Signed: By: Lavonia Dana M.D. On: 08/30/2017 21:54   Ct Cervical Spine Wo Contrast  Result Date: 08/30/2017 CLINICAL DATA:  [place] 10 feet off of a roof, cervical spine trauma high clinical risk, minor head trauma, GCS greater than or equal 13, complaining of pain EXAM: CT HEAD WITHOUT CONTRAST CT CERVICAL SPINE WITHOUT CONTRAST TECHNIQUE: Multidetector CT imaging of the head and cervical spine was performed following the standard protocol without intravenous contrast. Multiplanar CT image reconstructions of the cervical spine were also generated. COMPARISON:  CT head 10/23/2009 FINDINGS: CT HEAD FINDINGS Brain: Generalized atrophy. Normal ventricular morphology. No midline shift or mass effect. Minimal small vessel chronic ischemic changes of deep cerebral white matter. No intracranial hemorrhage, mass lesion, or evidence acute infarction. No extra-axial fluid collections. Vascular: Atherosclerotic calcifications of internal carotid arteries bilaterally at skullbase Skull: Intact Sinuses/Orbits: Partial opacification of RIGHT sphenoid sinus slightly increased since previous exam question mucosal retention cyst. Remaining visualized paranasal sinuses and mastoid air cells clear Other: N/A CT CERVICAL SPINE FINDINGS Alignment: Minimal retrolisthesis at C3-C4. Alignment otherwise normal. Skull base and vertebrae:  Visualized skullbase intact. 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 Osseous mineralization normal. Vertebral body heights maintained without fracture or subluxation. No bone destruction. Dextroconvex cervical scoliosis. Mild scattered facet degenerative changes. Soft tissues and spinal canal: Prevertebral soft tissues normal thickness. 9 mm RIGHT thyroid nodule. Remaining visualized cervical soft tissues unremarkable. Incidentally noted atherosclerotic calcifications of the carotid bifurcations. Disc levels: Disc space narrowing C3-C4. No definite spinal stenosis.  Upper chest: Lung apices clear Other: N/A IMPRESSION: Atrophy with minimal small vessel chronic ischemic changes of deep cerebral white matter. No acute intracranial abnormalities. Congenital fusion C4-C5. No acute cervical spine abnormalities. Electronically Signed   By: Lavonia Dana M.D.   On: 08/30/2017 21:33   Ct Abdomen Pelvis W Contrast  Addendum Date: 08/30/2017   ADDENDUM REPORT: 08/30/2017 22:18 ADDENDUM: Comparison is made to multiple prior chest CTs, ranging from 05/26/2017 back to 01/16/2014. The spiculated 8 mm diameter nodular density does not appear significantly changed versus the earlier study of 05/26/2017. When reviewed back to earlier exams, this area of questioned nodularity has significantly decreased in size since 01/16/2014 indicating this is most likely representing scarring. The second 3 mm nodular density in the lateral mid RIGHT lung has not significantly changed since 10/30/2014, also favoring a benign etiology. Per the recommendation of the most recent screening chest CT from August 2018, recommend continued annual low-dose screening CT chest to demonstrate continued stability of the slightly irregular 8 mm nodular density. Electronically Signed   By: Lavonia Dana M.D.   On: 08/30/2017 22:18   Result Date: 08/30/2017 CLINICAL DATA:  [place] 10 foot from a roof today, complaining of RIGHT rib pain, history asthma, COPD, coronary artery disease EXAM: CT CHEST, ABDOMEN, AND PELVIS WITH CONTRAST TECHNIQUE: Multidetector CT imaging of the chest, abdomen and pelvis was performed following the standard protocol during bolus administration of intravenous contrast. Sagittal and coronal MPR images reconstructed from axial data set. CONTRAST:  100 mL ISOVUE-300 IOPAMIDOL (ISOVUE-300) INJECTION 61% IV. No oral contrast. COMPARISON:  CT chest 05/26/2017 FINDINGS: CT CHEST FINDINGS Cardiovascular: Atherosclerotic calcifications aorta, proximal great vessels and coronary arteries. Aorta normal  caliber without aneurysm or peri-aortic infiltration/hemorrhage. No pericardial effusion. Pulmonary arteries grossly patent on nondedicated exam. Mediastinum/Nodes: Base of cervical region normal appearance. Few normal size mediastinal and hilar lymph nodes without thoracic adenopathy. Esophagus unremarkable. Lungs/Pleura: Dependent atelectasis in BILATERAL lower lobes. 3 mm RIGHT lung nodule image 32. Emphysematous changes greatest at apices. Small slightly irregular nodular density RIGHT upper lobe 8 mm diameter image 63. Remaining lungs clear. Scattered tiny foci of pneumothorax in the RIGHT hemithorax. Musculoskeletal: Fractures of the RIGHT eighth, ninth, tenth, and eleventh ribs. Foci of chest wall pneumatosis adjacent to rib fractures. CT ABDOMEN PELVIS FINDINGS Hepatobiliary: Liver normal appearance. Dependent calcified gallstone in gallbladder Pancreas: Normal appearance Spleen: Normal appearance Adrenals/Urinary Tract: Adrenal glands normal appearance. Kidneys, ureters and bladder normal appearance. Stomach/Bowel: Normal appendix. Stomach and bowel loops normal appearance. Vascular/Lymphatic: Atherosclerotic calcifications of aorta and iliac arteries without aneurysm. Calcified plaque at proximal SMA. No adenopathy. Reproductive: Prostatic enlargement, gland measuring 5.5 x 4.3 x 4.6 cm (volume = 57 cm^3). Seminal vesicles unremarkable. Other: Small BILATERAL inguinal hernias containing fat larger on RIGHT. No free intraperitoneal air or fluid. Musculoskeletal: Demineralized without acute abnormalities. IMPRESSION: Fractures of the RIGHT eighth ninth tenth and eleventh ribs. Dependent atelectasis in BILATERAL lower lobes. Tiny foci of RIGHT pneumothorax. No acute intra-abdominal or intrapelvic abnormalities. Prostatic enlargement. Small BILATERAL inguinal hernias. 3 mm  nonspecific RIGHT lung nodule with an additional slightly irregular 8 mm diameter nodular density in the RIGHT upper lobe question nodule  versus scarring ; recommendation below. Non-contrast chest CT at 3-6 months is recommended. If the nodules are stable at time of repeat CT, then future CT at 18-24 months (from today's scan) is considered optional for low-risk patients, but is recommended for high-risk patients. This recommendation follows the consensus statement: Guidelines for Management of Incidental Pulmonary Nodules Detected on CT Images: From the Fleischner Society 2017; Radiology 2017; 284:228-243. Aortic Atherosclerosis (ICD10-I70.0) and Emphysema (ICD10-J43.9). Findings called to Dr. Roxanne Mins On 88/50/2774 at 2152 hours. Electronically Signed: By: Lavonia Dana M.D. On: 08/30/2017 21:54    Review of Systems  Constitutional: Negative for chills and weight loss.  HENT: Negative for hearing loss.   Eyes: Negative for blurred vision and double vision.  Respiratory: Positive for cough and sputum production. Negative for shortness of breath.   Cardiovascular: Positive for chest pain.  Gastrointestinal: Negative for abdominal pain, nausea and vomiting.  Genitourinary: Negative for dysuria.  Musculoskeletal: Positive for back pain and falls. Negative for neck pain.  Skin: Negative for rash.  Neurological: Negative for dizziness and headaches.  Endo/Heme/Allergies: Does not bruise/bleed easily.  Psychiatric/Behavioral: Negative for depression and suicidal ideas.    Blood pressure (!) 156/89, pulse 76, temperature 98.9 F (37.2 C), temperature source Oral, resp. rate 17, height 5' 8" (1.727 m), weight 74.8 kg (165 lb), SpO2 95 %. Physical Exam  Constitutional: He is oriented to person, place, and time. He appears well-developed and well-nourished.  HENT:  Contusion left forehead   Eyes: Conjunctivae are normal. Pupils are equal, round, and reactive to light.  Neck: Normal range of motion. Neck supple. No spinous process tenderness and no muscular tenderness present. Normal range of motion present.  Cardiovascular: Normal rate and  regular rhythm.  Respiratory: Effort normal and breath sounds normal. He exhibits tenderness.  GI: Soft. Bowel sounds are normal. He exhibits no distension. There is no tenderness.  Genitourinary:  Genitourinary Comments: BIH reducible   Musculoskeletal: Normal range of motion.  Neurological: He is alert and oriented to person, place, and time. GCS eye subscore is 4. GCS verbal subscore is 5. GCS motor subscore is 6.  Skin: Skin is warm and dry.  Psychiatric: He has a normal mood and affect. His behavior is normal. Thought content normal.     Assessment/Plan Fall from ladder 10 ft  Multiple right rib fractures  Very small R PTX  Admit for pulmonary toilet and pain control  COPD pulmonary meds  Pulmonary nodules   Stable by [place], MD 08/30/2017, 10:29 PM

## 2017-08-30 NOTE — ED Notes (Signed)
Attempted report x1. 

## 2017-08-30 NOTE — ED Notes (Signed)
Patient transported to CT 

## 2017-08-30 NOTE — ED Notes (Signed)
Patient placed on 2L O2 nasal cannula d/t SpO2 decreasing to low 90s following pain medication administration.

## 2017-08-31 ENCOUNTER — Observation Stay (HOSPITAL_COMMUNITY): Payer: 59

## 2017-08-31 ENCOUNTER — Inpatient Hospital Stay (HOSPITAL_COMMUNITY): Payer: 59

## 2017-08-31 ENCOUNTER — Other Ambulatory Visit: Payer: Self-pay

## 2017-08-31 DIAGNOSIS — D62 Acute posthemorrhagic anemia: Secondary | ICD-10-CM | POA: Diagnosis not present

## 2017-08-31 DIAGNOSIS — J9811 Atelectasis: Secondary | ICD-10-CM | POA: Diagnosis not present

## 2017-08-31 DIAGNOSIS — R918 Other nonspecific abnormal finding of lung field: Secondary | ICD-10-CM | POA: Diagnosis not present

## 2017-08-31 DIAGNOSIS — G9341 Metabolic encephalopathy: Secondary | ICD-10-CM | POA: Diagnosis present

## 2017-08-31 DIAGNOSIS — R0602 Shortness of breath: Secondary | ICD-10-CM | POA: Diagnosis not present

## 2017-08-31 DIAGNOSIS — Z9911 Dependence on respirator [ventilator] status: Secondary | ICD-10-CM | POA: Diagnosis not present

## 2017-08-31 DIAGNOSIS — J939 Pneumothorax, unspecified: Secondary | ICD-10-CM | POA: Diagnosis not present

## 2017-08-31 DIAGNOSIS — S2231XA Fracture of one rib, right side, initial encounter for closed fracture: Secondary | ICD-10-CM | POA: Diagnosis not present

## 2017-08-31 DIAGNOSIS — I10 Essential (primary) hypertension: Secondary | ICD-10-CM | POA: Diagnosis present

## 2017-08-31 DIAGNOSIS — G8918 Other acute postprocedural pain: Secondary | ICD-10-CM | POA: Diagnosis not present

## 2017-08-31 DIAGNOSIS — S2241XD Multiple fractures of ribs, right side, subsequent encounter for fracture with routine healing: Secondary | ICD-10-CM | POA: Diagnosis not present

## 2017-08-31 DIAGNOSIS — D690 Allergic purpura: Secondary | ICD-10-CM | POA: Diagnosis not present

## 2017-08-31 DIAGNOSIS — R6521 Severe sepsis with septic shock: Secondary | ICD-10-CM | POA: Diagnosis not present

## 2017-08-31 DIAGNOSIS — Z79899 Other long term (current) drug therapy: Secondary | ICD-10-CM | POA: Diagnosis not present

## 2017-08-31 DIAGNOSIS — S2241XA Multiple fractures of ribs, right side, initial encounter for closed fracture: Secondary | ICD-10-CM | POA: Diagnosis not present

## 2017-08-31 DIAGNOSIS — Y95 Nosocomial condition: Secondary | ICD-10-CM | POA: Diagnosis not present

## 2017-08-31 DIAGNOSIS — S299XXA Unspecified injury of thorax, initial encounter: Secondary | ICD-10-CM | POA: Diagnosis not present

## 2017-08-31 DIAGNOSIS — W11XXXD Fall on and from ladder, subsequent encounter: Secondary | ICD-10-CM | POA: Diagnosis not present

## 2017-08-31 DIAGNOSIS — F17210 Nicotine dependence, cigarettes, uncomplicated: Secondary | ICD-10-CM | POA: Diagnosis present

## 2017-08-31 DIAGNOSIS — S299XXD Unspecified injury of thorax, subsequent encounter: Secondary | ICD-10-CM | POA: Diagnosis not present

## 2017-08-31 DIAGNOSIS — Z885 Allergy status to narcotic agent status: Secondary | ICD-10-CM | POA: Diagnosis not present

## 2017-08-31 DIAGNOSIS — R52 Pain, unspecified: Secondary | ICD-10-CM | POA: Diagnosis not present

## 2017-08-31 DIAGNOSIS — Z7951 Long term (current) use of inhaled steroids: Secondary | ICD-10-CM | POA: Diagnosis not present

## 2017-08-31 DIAGNOSIS — N170 Acute kidney failure with tubular necrosis: Secondary | ICD-10-CM | POA: Diagnosis not present

## 2017-08-31 DIAGNOSIS — J90 Pleural effusion, not elsewhere classified: Secondary | ICD-10-CM | POA: Diagnosis not present

## 2017-08-31 DIAGNOSIS — S61411A Laceration without foreign body of right hand, initial encounter: Secondary | ICD-10-CM | POA: Diagnosis present

## 2017-08-31 DIAGNOSIS — J810 Acute pulmonary edema: Secondary | ICD-10-CM | POA: Diagnosis not present

## 2017-08-31 DIAGNOSIS — W11XXXA Fall on and from ladder, initial encounter: Secondary | ICD-10-CM | POA: Diagnosis present

## 2017-08-31 DIAGNOSIS — R0902 Hypoxemia: Secondary | ICD-10-CM | POA: Diagnosis not present

## 2017-08-31 DIAGNOSIS — S2249XA Multiple fractures of ribs, unspecified side, initial encounter for closed fracture: Secondary | ICD-10-CM | POA: Diagnosis not present

## 2017-08-31 DIAGNOSIS — E876 Hypokalemia: Secondary | ICD-10-CM | POA: Diagnosis present

## 2017-08-31 DIAGNOSIS — Z888 Allergy status to other drugs, medicaments and biological substances status: Secondary | ICD-10-CM | POA: Diagnosis not present

## 2017-08-31 DIAGNOSIS — S270XXD Traumatic pneumothorax, subsequent encounter: Secondary | ICD-10-CM | POA: Diagnosis not present

## 2017-08-31 DIAGNOSIS — S0181XA Laceration without foreign body of other part of head, initial encounter: Secondary | ICD-10-CM | POA: Diagnosis present

## 2017-08-31 DIAGNOSIS — I2510 Atherosclerotic heart disease of native coronary artery without angina pectoris: Secondary | ICD-10-CM | POA: Diagnosis present

## 2017-08-31 DIAGNOSIS — R339 Retention of urine, unspecified: Secondary | ICD-10-CM | POA: Diagnosis not present

## 2017-08-31 DIAGNOSIS — J9600 Acute respiratory failure, unspecified whether with hypoxia or hypercapnia: Secondary | ICD-10-CM | POA: Diagnosis not present

## 2017-08-31 DIAGNOSIS — S270XXA Traumatic pneumothorax, initial encounter: Secondary | ICD-10-CM | POA: Diagnosis not present

## 2017-08-31 DIAGNOSIS — J690 Pneumonitis due to inhalation of food and vomit: Secondary | ICD-10-CM | POA: Diagnosis not present

## 2017-08-31 DIAGNOSIS — J189 Pneumonia, unspecified organism: Secondary | ICD-10-CM | POA: Diagnosis not present

## 2017-08-31 DIAGNOSIS — R0682 Tachypnea, not elsewhere classified: Secondary | ICD-10-CM | POA: Diagnosis not present

## 2017-08-31 DIAGNOSIS — R51 Headache: Secondary | ICD-10-CM | POA: Diagnosis not present

## 2017-08-31 DIAGNOSIS — Z720 Tobacco use: Secondary | ICD-10-CM | POA: Diagnosis not present

## 2017-08-31 DIAGNOSIS — J9601 Acute respiratory failure with hypoxia: Secondary | ICD-10-CM | POA: Diagnosis not present

## 2017-08-31 DIAGNOSIS — K219 Gastro-esophageal reflux disease without esophagitis: Secondary | ICD-10-CM | POA: Diagnosis present

## 2017-08-31 DIAGNOSIS — J449 Chronic obstructive pulmonary disease, unspecified: Secondary | ICD-10-CM | POA: Diagnosis not present

## 2017-08-31 DIAGNOSIS — R0603 Acute respiratory distress: Secondary | ICD-10-CM | POA: Diagnosis not present

## 2017-08-31 DIAGNOSIS — Z4682 Encounter for fitting and adjustment of non-vascular catheter: Secondary | ICD-10-CM | POA: Diagnosis not present

## 2017-08-31 DIAGNOSIS — A419 Sepsis, unspecified organism: Secondary | ICD-10-CM | POA: Diagnosis not present

## 2017-08-31 DIAGNOSIS — Z452 Encounter for adjustment and management of vascular access device: Secondary | ICD-10-CM | POA: Diagnosis not present

## 2017-08-31 LAB — BLOOD GAS, ARTERIAL
Acid-base deficit: 2.1 mmol/L — ABNORMAL HIGH (ref 0.0–2.0)
Acid-base deficit: 2.6 mmol/L — ABNORMAL HIGH (ref 0.0–2.0)
BICARBONATE: 22.4 mmol/L (ref 20.0–28.0)
Bicarbonate: 22.5 mmol/L (ref 20.0–28.0)
DRAWN BY: 51133
Delivery systems: POSITIVE
Drawn by: 257081
Expiratory PAP: 8
FIO2: 100
Inspiratory PAP: 14
O2 CONTENT: 5 L/min
O2 SAT: 89.5 %
O2 Saturation: 84.8 %
PATIENT TEMPERATURE: 98.6
PCO2 ART: 40.3 mmHg (ref 32.0–48.0)
PCO2 ART: 45.2 mmHg (ref 32.0–48.0)
PH ART: 7.319 — AB (ref 7.350–7.450)
PO2 ART: 49.5 mmHg — AB (ref 83.0–108.0)
Patient temperature: 98.6
RATE: 10 resp/min
pH, Arterial: 7.364 (ref 7.350–7.450)
pO2, Arterial: 59.8 mmHg — ABNORMAL LOW (ref 83.0–108.0)

## 2017-08-31 LAB — MRSA PCR SCREENING: MRSA by PCR: NEGATIVE

## 2017-08-31 LAB — HIV ANTIBODY (ROUTINE TESTING W REFLEX): HIV Screen 4th Generation wRfx: NONREACTIVE

## 2017-08-31 MED ORDER — ACETAMINOPHEN 325 MG PO TABS
650.0000 mg | ORAL_TABLET | Freq: Four times a day (QID) | ORAL | Status: DC
  Administered 2017-08-31: 650 mg via ORAL
  Filled 2017-08-31: qty 2

## 2017-08-31 MED ORDER — METHOCARBAMOL 1000 MG/10ML IJ SOLN
500.0000 mg | Freq: Three times a day (TID) | INTRAVENOUS | Status: DC
  Administered 2017-08-31 – 2017-09-01 (×4): 500 mg via INTRAVENOUS
  Filled 2017-08-31 (×3): qty 5
  Filled 2017-08-31: qty 550
  Filled 2017-08-31 (×2): qty 5

## 2017-08-31 MED ORDER — POTASSIUM CHLORIDE 10 MEQ/100ML IV SOLN
10.0000 meq | INTRAVENOUS | Status: AC
  Administered 2017-09-01 (×4): 10 meq via INTRAVENOUS
  Filled 2017-08-31 (×4): qty 100

## 2017-08-31 MED ORDER — POTASSIUM CHLORIDE CRYS ER 20 MEQ PO TBCR
20.0000 meq | EXTENDED_RELEASE_TABLET | Freq: Two times a day (BID) | ORAL | Status: DC
  Administered 2017-08-31: 20 meq via ORAL
  Filled 2017-08-31: qty 1

## 2017-08-31 MED ORDER — HYDROMORPHONE HCL 1 MG/ML IJ SOLN
0.5000 mg | INTRAMUSCULAR | Status: DC | PRN
  Administered 2017-09-01 (×2): 0.5 mg via INTRAVENOUS
  Filled 2017-08-31 (×3): qty 0.5

## 2017-08-31 MED ORDER — ACETAMINOPHEN 10 MG/ML IV SOLN
1000.0000 mg | Freq: Four times a day (QID) | INTRAVENOUS | Status: AC
  Administered 2017-08-31 – 2017-09-01 (×4): 1000 mg via INTRAVENOUS
  Filled 2017-08-31 (×4): qty 100

## 2017-08-31 MED ORDER — METHOCARBAMOL 500 MG PO TABS
750.0000 mg | ORAL_TABLET | Freq: Three times a day (TID) | ORAL | Status: DC
  Administered 2017-08-31: 750 mg via ORAL
  Filled 2017-08-31: qty 1

## 2017-08-31 MED ORDER — KETOROLAC TROMETHAMINE 15 MG/ML IJ SOLN
15.0000 mg | Freq: Four times a day (QID) | INTRAMUSCULAR | Status: DC
  Administered 2017-08-31 – 2017-09-01 (×3): 15 mg via INTRAVENOUS
  Filled 2017-08-31 (×3): qty 1

## 2017-08-31 MED ORDER — GABAPENTIN 600 MG PO TABS
600.0000 mg | ORAL_TABLET | Freq: Three times a day (TID) | ORAL | Status: DC
  Administered 2017-08-31 – 2017-09-01 (×4): 600 mg via ORAL
  Filled 2017-08-31 (×6): qty 1

## 2017-08-31 MED ORDER — OXYCODONE HCL 5 MG PO TABS
5.0000 mg | ORAL_TABLET | ORAL | Status: DC | PRN
  Administered 2017-08-31 – 2017-09-01 (×4): 10 mg via ORAL
  Filled 2017-08-31 (×4): qty 2

## 2017-08-31 MED ORDER — IPRATROPIUM-ALBUTEROL 0.5-2.5 (3) MG/3ML IN SOLN
3.0000 mL | Freq: Four times a day (QID) | RESPIRATORY_TRACT | Status: DC
  Administered 2017-08-31 – 2017-09-15 (×60): 3 mL via RESPIRATORY_TRACT
  Filled 2017-08-31 (×62): qty 3

## 2017-08-31 NOTE — Progress Notes (Signed)
Patient was moved to the SDU this evening for escalation of respiratory needs - had saturations in the 80s on a non-rebreather. He's now on BiPAP, 100% FiO2. His saturations now are in the 93-94% range.  Repeat ABG shows some improvement. A repeat CXR demonstrated stable findings and did not show a significant pneumothorax on the right; there was additionally atelectasis.  I had a long discussion with his wife about his situation as she's appropriately concerned. I explained in deatail pulmonary mechanics and what we are dealing with in his particular situation, namely, rib fractures+COPD. I additionally discussed that if things were to worsen, our next step would be intubation for mechanical ventilation and transfer to the SICU.  PLAN -Continue BiPAP -IV tylenol; toradol; PRN narcotics; decreased the dilaudid dose to 0.5mg  as the 1mg  dose made him significantly more drowsy  -Nebulizer treatment -Will continue to monitor closely

## 2017-08-31 NOTE — Progress Notes (Signed)
At 1725 noted an order to transfer patient for bipap, patient is alert and oriented, cooperative, still on venturi mark, O2 sat 85 %. RT at bedside started patient on Non rebreather , O2 saturation up to 93 %. Vital signs stable. RR called, patient hook to cardiac monitor.

## 2017-08-31 NOTE — Progress Notes (Signed)
[facility]/Trauma Progress Note      Assessment/Plan  COPD CAD GERD Pulmonary nodules - seen on CT this admission, one is smaller and one is unchanged, F/U with PCP  Fall off Ladder R 8-11 rib fractures Tiny R PTX B/L actelectasis - pulmonary toileting, DUO nebs PRN, supplemental O2 at 2% [place], repeat chest xray tomorrow Sm b/l inguinal hernia's - reducible Hypokalemia - PO replacement Leukocytosis - likely 2/2 to trauma, repeat CBC in AM, afebrile  FEN: heart healthy VTE: SCD's, lovenox ID: none currently Follow up: TBD  DISPO: aggressive pulmonary toileting, nebs PRN, PT pending, repeat labs and chest xray tomorrow    LOS: 0 days    Subjective:  CC: rib pain  Pain better than yesterday and controlled with pain meds. No abdominal pain. Last Bm was yesterday. No change in baseline cough. No fevers or chills overnight. No new complaints.   Objective: Vital signs in last 24 hours: Temp:  [98.7 F (37.1 C)-99 F (37.2 C)] 98.7 F (37.1 C) (11/21 0424) Pulse Rate:  [68-93] 75 (11/21 0424) Resp:  [14-23] 17 (11/21 0424) BP: (112-156)/(65-93) 112/65 (11/21 0424) SpO2:  [93 %-96 %] 94 % (11/21 0424) Weight:  [165 lb (74.8 kg)-167 lb 8.8 oz (76 kg)] 167 lb 8.8 oz (76 kg) (11/21 0015) Last BM Date: 08/30/17  Intake/Output from previous day: 11/20 0701 - 11/21 0700 In: 245 [P.O.:120; I.V.:125] Out: -  Intake/Output this shift: No intake/output data recorded.  PE: Gen:  Alert, NAD, pleasant, cooperative Card:  RRR, no M/G/R heard Pulm:  Scattered expiratory wheezes throughout, rate and effort normal, [place] in place Chest wall: crepitus to posterior right ribs, no edema or ecchymosis Abd: Soft, NT/ND, +BS, no HSM Skin: no rashes noted, warm and dry   Anti-infectives: Anti-infectives (From admission, onward)   None      Lab Results:  Recent Labs    08/30/17 1835  WBC 16.5*  HGB 14.0  HCT 39.2  PLT 209   BMET Recent Labs     08/30/17 1835  NA 134*  K 3.2*  CL 103  CO2 25  GLUCOSE 117*  BUN 6  CREATININE 0.98  CALCIUM 8.5*   PT/INR No results for input(s): LABPROT, INR in the last 72 hours. CMP     Component Value Date/Time   NA 134 (L) 08/30/2017 1835   NA 137 12/02/2013 0405   K 3.2 (L) 08/30/2017 1835   K 3.8 12/02/2013 0405   CL 103 08/30/2017 1835   CL 106 12/02/2013 0405   CO2 25 08/30/2017 1835   CO2 26 12/02/2013 0405   GLUCOSE 117 (H) 08/30/2017 1835   GLUCOSE 88 12/02/2013 0405   BUN 6 08/30/2017 1835   BUN 12 12/02/2013 0405   CREATININE 0.98 08/30/2017 1835   CREATININE 0.94 12/02/2013 0405   CALCIUM 8.5 (L) 08/30/2017 1835   CALCIUM 8.3 (L) 12/02/2013 0405   PROT 6.3 (L) 08/30/2017 1835   ALBUMIN 3.5 08/30/2017 1835   AST 23 08/30/2017 1835   ALT 15 (L) 08/30/2017 1835   ALKPHOS 159 (H) 08/30/2017 1835   BILITOT 0.8 08/30/2017 1835   GFRNONAA >60 08/30/2017 1835   GFRNONAA >60 12/02/2013 0405   GFRAA >60 08/30/2017 1835   GFRAA >60 12/02/2013 0405   Lipase     Component Value Date/Time   LIPASE 32 08/30/2017 1835    Studies/Results: Ct Head Wo Contrast  Result Date: 08/30/2017 CLINICAL DATA:  [place] 10 feet off of a roof, cervical  spine trauma high clinical risk, minor head trauma, GCS greater than or equal 13, complaining of pain EXAM: CT HEAD WITHOUT CONTRAST CT CERVICAL SPINE WITHOUT CONTRAST TECHNIQUE: Multidetector CT imaging of the head and cervical spine was performed following the standard protocol without intravenous contrast. Multiplanar CT image reconstructions of the cervical spine were also generated. COMPARISON:  CT head 10/23/2009 FINDINGS: CT HEAD FINDINGS Brain: Generalized atrophy. Normal ventricular morphology. No midline shift or mass effect. Minimal small vessel chronic ischemic changes of deep cerebral white matter. No intracranial hemorrhage, mass lesion, or evidence acute infarction. No extra-axial fluid collections. Vascular: Atherosclerotic  calcifications of internal carotid arteries bilaterally at skullbase Skull: Intact Sinuses/Orbits: Partial opacification of RIGHT sphenoid sinus slightly increased since previous exam question mucosal retention cyst. Remaining visualized paranasal sinuses and mastoid air cells clear Other: N/A CT CERVICAL SPINE FINDINGS Alignment: Minimal retrolisthesis at C3-C4. Alignment otherwise normal. Skull base and vertebrae: Visualized skullbase intact. 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 Osseous mineralization normal. Vertebral body heights maintained without fracture or subluxation. No bone destruction. Dextroconvex cervical scoliosis. Mild scattered facet degenerative changes. Soft tissues and spinal canal: Prevertebral soft tissues normal thickness. 9 mm RIGHT thyroid nodule. Remaining visualized cervical soft tissues unremarkable. Incidentally noted atherosclerotic calcifications of the carotid bifurcations. Disc levels: Disc space narrowing C3-C4. No definite spinal stenosis. Upper chest: Lung apices clear Other: N/A IMPRESSION: Atrophy with minimal small vessel chronic ischemic changes of deep cerebral white matter. No acute intracranial abnormalities. Congenital fusion C4-C5. No acute cervical spine abnormalities. Electronically Signed   By: Lavonia Dana M.D.   On: 08/30/2017 21:33   Ct Chest W Contrast  Addendum Date: 08/30/2017   ADDENDUM REPORT: 08/30/2017 22:18 ADDENDUM: Comparison is made to multiple prior chest CTs, ranging from 05/26/2017 back to 01/16/2014. The spiculated 8 mm diameter nodular density does not appear significantly changed versus the earlier study of 05/26/2017. When reviewed back to earlier exams, this area of questioned nodularity has significantly decreased in size since 01/16/2014 indicating this is most likely representing scarring. The second 3 mm nodular density in the lateral mid RIGHT lung has not significantly changed since  10/30/2014, also favoring a benign etiology. Per the recommendation of the most recent screening chest CT from August 2018, recommend continued annual low-dose screening CT chest to demonstrate continued stability of the slightly irregular 8 mm nodular density. Electronically Signed   By: Lavonia Dana M.D.   On: 08/30/2017 22:18   Result Date: 08/30/2017 CLINICAL DATA:  [place] 10 foot from a roof today, complaining of RIGHT rib pain, history asthma, COPD, coronary artery disease EXAM: CT CHEST, ABDOMEN, AND PELVIS WITH CONTRAST TECHNIQUE: Multidetector CT imaging of the chest, abdomen and pelvis was performed following the standard protocol during bolus administration of intravenous contrast. Sagittal and coronal MPR images reconstructed from axial data set. CONTRAST:  100 mL ISOVUE-300 IOPAMIDOL (ISOVUE-300) INJECTION 61% IV. No oral contrast. COMPARISON:  CT chest 05/26/2017 FINDINGS: CT CHEST FINDINGS Cardiovascular: Atherosclerotic calcifications aorta, proximal great vessels and coronary arteries. Aorta normal caliber without aneurysm or peri-aortic infiltration/hemorrhage. No pericardial effusion. Pulmonary arteries grossly patent on nondedicated exam. Mediastinum/Nodes: Base of cervical region normal appearance. Few normal size mediastinal and hilar lymph nodes without thoracic adenopathy. Esophagus unremarkable. Lungs/Pleura: Dependent atelectasis in BILATERAL lower lobes. 3 mm RIGHT lung nodule image 32. Emphysematous changes greatest at apices. Small slightly irregular nodular density RIGHT upper lobe 8 mm diameter image 63. Remaining lungs clear. Scattered tiny foci of pneumothorax  in the RIGHT hemithorax. Musculoskeletal: Fractures of the RIGHT eighth, ninth, tenth, and eleventh ribs. Foci of chest wall pneumatosis adjacent to rib fractures. CT ABDOMEN PELVIS FINDINGS Hepatobiliary: Liver normal appearance. Dependent calcified gallstone in gallbladder Pancreas: Normal appearance Spleen: Normal appearance  Adrenals/Urinary Tract: Adrenal glands normal appearance. Kidneys, ureters and bladder normal appearance. Stomach/Bowel: Normal appendix. Stomach and bowel loops normal appearance. Vascular/Lymphatic: Atherosclerotic calcifications of aorta and iliac arteries without aneurysm. Calcified plaque at proximal SMA. No adenopathy. Reproductive: Prostatic enlargement, gland measuring 5.5 x 4.3 x 4.6 cm (volume = 57 cm^3). Seminal vesicles unremarkable. Other: Small BILATERAL inguinal hernias containing fat larger on RIGHT. No free intraperitoneal air or fluid. Musculoskeletal: Demineralized without acute abnormalities. IMPRESSION: Fractures of the RIGHT eighth ninth tenth and eleventh ribs. Dependent atelectasis in BILATERAL lower lobes. Tiny foci of RIGHT pneumothorax. No acute intra-abdominal or intrapelvic abnormalities. Prostatic enlargement. Small BILATERAL inguinal hernias. 3 mm nonspecific RIGHT lung nodule with an additional slightly irregular 8 mm diameter nodular density in the RIGHT upper lobe question nodule versus scarring ; recommendation below. Non-contrast chest CT at 3-6 months is recommended. If the nodules are stable at time of repeat CT, then future CT at 18-24 months (from today's scan) is considered optional for low-risk patients, but is recommended for high-risk patients. This recommendation follows the consensus statement: Guidelines for Management of Incidental Pulmonary Nodules Detected on CT Images: From the Fleischner Society 2017; Radiology 2017; 284:228-243. Aortic Atherosclerosis (ICD10-I70.0) and Emphysema (ICD10-J43.9). Findings called to Dr. Roxanne Mins On 16/07/9603 at 2152 hours. Electronically Signed: By: Lavonia Dana M.D. On: 08/30/2017 21:54   Ct Cervical Spine Wo Contrast  Result Date: 08/30/2017 CLINICAL DATA:  [place] 10 feet off of a roof, cervical spine trauma high clinical risk, minor head trauma, GCS greater than or equal 13, complaining of pain EXAM: CT HEAD WITHOUT CONTRAST CT  CERVICAL SPINE WITHOUT CONTRAST TECHNIQUE: Multidetector CT imaging of the head and cervical spine was performed following the standard protocol without intravenous contrast. Multiplanar CT image reconstructions of the cervical spine were also generated. COMPARISON:  CT head 10/23/2009 FINDINGS: CT HEAD FINDINGS Brain: Generalized atrophy. Normal ventricular morphology. No midline shift or mass effect. Minimal small vessel chronic ischemic changes of deep cerebral white matter. No intracranial hemorrhage, mass lesion, or evidence acute infarction. No extra-axial fluid collections. Vascular: Atherosclerotic calcifications of internal carotid arteries bilaterally at skullbase Skull: Intact Sinuses/Orbits: Partial opacification of RIGHT sphenoid sinus slightly increased since previous exam question mucosal retention cyst. Remaining visualized paranasal sinuses and mastoid air cells clear Other: N/A CT CERVICAL SPINE FINDINGS Alignment: Minimal retrolisthesis at C3-C4. Alignment otherwise normal. Skull base and vertebrae: Visualized skullbase intact. 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 Osseous mineralization normal. Vertebral body heights maintained without fracture or subluxation. No bone destruction. Dextroconvex cervical scoliosis. Mild scattered facet degenerative changes. Soft tissues and spinal canal: Prevertebral soft tissues normal thickness. 9 mm RIGHT thyroid nodule. Remaining visualized cervical soft tissues unremarkable. Incidentally noted atherosclerotic calcifications of the carotid bifurcations. Disc levels: Disc space narrowing C3-C4. No definite spinal stenosis. Upper chest: Lung apices clear Other: N/A IMPRESSION: Atrophy with minimal small vessel chronic ischemic changes of deep cerebral white matter. No acute intracranial abnormalities. Congenital fusion C4-C5. No acute cervical spine abnormalities. Electronically Signed   By: Lavonia Dana M.D.   On:  08/30/2017 21:33   Ct Abdomen Pelvis W Contrast  Addendum Date: 08/30/2017   ADDENDUM REPORT: 08/30/2017 22:18 ADDENDUM: Comparison is made to multiple prior  chest CTs, ranging from 05/26/2017 back to 01/16/2014. The spiculated 8 mm diameter nodular density does not appear significantly changed versus the earlier study of 05/26/2017. When reviewed back to earlier exams, this area of questioned nodularity has significantly decreased in size since 01/16/2014 indicating this is most likely representing scarring. The second 3 mm nodular density in the lateral mid RIGHT lung has not significantly changed since 10/30/2014, also favoring a benign etiology. Per the recommendation of the most recent screening chest CT from August 2018, recommend continued annual low-dose screening CT chest to demonstrate continued stability of the slightly irregular 8 mm nodular density. Electronically Signed   By: Lavonia Dana M.D.   On: 08/30/2017 22:18   Result Date: 08/30/2017 CLINICAL DATA:  [place] 10 foot from a roof today, complaining of RIGHT rib pain, history asthma, COPD, coronary artery disease EXAM: CT CHEST, ABDOMEN, AND PELVIS WITH CONTRAST TECHNIQUE: Multidetector CT imaging of the chest, abdomen and pelvis was performed following the standard protocol during bolus administration of intravenous contrast. Sagittal and coronal MPR images reconstructed from axial data set. CONTRAST:  100 mL ISOVUE-300 IOPAMIDOL (ISOVUE-300) INJECTION 61% IV. No oral contrast. COMPARISON:  CT chest 05/26/2017 FINDINGS: CT CHEST FINDINGS Cardiovascular: Atherosclerotic calcifications aorta, proximal great vessels and coronary arteries. Aorta normal caliber without aneurysm or peri-aortic infiltration/hemorrhage. No pericardial effusion. Pulmonary arteries grossly patent on nondedicated exam. Mediastinum/Nodes: Base of cervical region normal appearance. Few normal size mediastinal and hilar lymph nodes without thoracic adenopathy. Esophagus  unremarkable. Lungs/Pleura: Dependent atelectasis in BILATERAL lower lobes. 3 mm RIGHT lung nodule image 32. Emphysematous changes greatest at apices. Small slightly irregular nodular density RIGHT upper lobe 8 mm diameter image 63. Remaining lungs clear. Scattered tiny foci of pneumothorax in the RIGHT hemithorax. Musculoskeletal: Fractures of the RIGHT eighth, ninth, tenth, and eleventh ribs. Foci of chest wall pneumatosis adjacent to rib fractures. CT ABDOMEN PELVIS FINDINGS Hepatobiliary: Liver normal appearance. Dependent calcified gallstone in gallbladder Pancreas: Normal appearance Spleen: Normal appearance Adrenals/Urinary Tract: Adrenal glands normal appearance. Kidneys, ureters and bladder normal appearance. Stomach/Bowel: Normal appendix. Stomach and bowel loops normal appearance. Vascular/Lymphatic: Atherosclerotic calcifications of aorta and iliac arteries without aneurysm. Calcified plaque at proximal SMA. No adenopathy. Reproductive: Prostatic enlargement, gland measuring 5.5 x 4.3 x 4.6 cm (volume = 57 cm^3). Seminal vesicles unremarkable. Other: Small BILATERAL inguinal hernias containing fat larger on RIGHT. No free intraperitoneal air or fluid. Musculoskeletal: Demineralized without acute abnormalities. IMPRESSION: Fractures of the RIGHT eighth ninth tenth and eleventh ribs. Dependent atelectasis in BILATERAL lower lobes. Tiny foci of RIGHT pneumothorax. No acute intra-abdominal or intrapelvic abnormalities. Prostatic enlargement. Small BILATERAL inguinal hernias. 3 mm nonspecific RIGHT lung nodule with an additional slightly irregular 8 mm diameter nodular density in the RIGHT upper lobe question nodule versus scarring ; recommendation below. Non-contrast chest CT at 3-6 months is recommended. If the nodules are stable at time of repeat CT, then future CT at 18-24 months (from today's scan) is considered optional for low-risk patients, but is recommended for high-risk patients. This  recommendation follows the consensus statement: Guidelines for Management of Incidental Pulmonary Nodules Detected on CT Images: From the Fleischner Society 2017; Radiology 2017; 284:228-243. Aortic Atherosclerosis (ICD10-I70.0) and Emphysema (ICD10-J43.9). Findings called to Dr. Roxanne Mins On 16/38/4665 at 2152 hours. Electronically Signed: By: Lavonia Dana M.D. On: 08/30/2017 21:54      Kalman Drape , [facility] 08/31/2017, 8:52 AM Pager: [phone] Consults: [phone] Mon-Fri 7:00 am-4:30 pm Sat-Sun 7:00 am-11:30 am

## 2017-08-31 NOTE — Significant Event (Signed)
Rapid Response Event Note  Overview: Time Called: [id number] Arrival Time: 1740 Event Type: Respiratory  Initial Focused Assessment: Patient admitted after a fall from a ladder with Rib fx and small pneumo. O2 sats decreased to 86% on Venturi mask,  Unable to take deep breaths because of pain.  ABG  7.3/40/49/22 Temp 100.1  BP 135/70  SR 97  RR 28  O2 sat 94% on NRB Lung sounds coarse Alert and oriented,  Moderate pain. Dr Dema Severin at bedside Wife at bedside  Interventions: Placed on Bipap 12/6 45% O2 sats 90-92%  RR 24,  Patient very comfortable Placed on tele:  SR 94-98  1900:  Hand off to [place] transfer to [place] (if not transferred):  Event Summary: Name of Physician Notified: Dr Dema Severin at bedside at      at    Outcome: Transferred ([place])     Raliegh Ip

## 2017-09-01 ENCOUNTER — Inpatient Hospital Stay (HOSPITAL_COMMUNITY): Payer: 59

## 2017-09-01 LAB — CBC
HEMATOCRIT: 38.4 % — AB (ref 39.0–52.0)
HEMOGLOBIN: 13.2 g/dL (ref 13.0–17.0)
MCH: 29.9 pg (ref 26.0–34.0)
MCHC: 34.4 g/dL (ref 30.0–36.0)
MCV: 86.9 fL (ref 78.0–100.0)
Platelets: 174 10*3/uL (ref 150–400)
RBC: 4.42 MIL/uL (ref 4.22–5.81)
RDW: 13 % (ref 11.5–15.5)
WBC: 12.8 10*3/uL — ABNORMAL HIGH (ref 4.0–10.5)

## 2017-09-01 LAB — BASIC METABOLIC PANEL
ANION GAP: 8 (ref 5–15)
BUN: 10 mg/dL (ref 6–20)
CO2: 23 mmol/L (ref 22–32)
Calcium: 8.4 mg/dL — ABNORMAL LOW (ref 8.9–10.3)
Chloride: 103 mmol/L (ref 101–111)
Creatinine, Ser: 1.14 mg/dL (ref 0.61–1.24)
GFR calc Af Amer: >60 mL/min (ref >60)
GFR calc non Af Amer: >60 mL/min (ref >60)
GLUCOSE: 93 mg/dL (ref 65–99)
POTASSIUM: 4 mmol/L (ref 3.5–5.1)
Sodium: 134 mmol/L — ABNORMAL LOW (ref 135–145)

## 2017-09-01 LAB — BLOOD GAS, ARTERIAL
ACID-BASE DEFICIT: 3.4 mmol/L — AB (ref 0.0–2.0)
BICARBONATE: 21.4 mmol/L (ref 20.0–28.0)
DRAWN BY: 511331
Delivery systems: POSITIVE
EXPIRATORY PAP: 6
FIO2: 0.8
Inspiratory PAP: 12
O2 Saturation: 91.1 %
PCO2 ART: 40.9 mmHg (ref 32.0–48.0)
PO2 ART: 62.3 mmHg — AB (ref 83.0–108.0)
Patient temperature: 98.6
RATE: 8 resp/min
pH, Arterial: 7.339 — ABNORMAL LOW (ref 7.350–7.450)

## 2017-09-01 MED ORDER — SENNA 8.6 MG PO TABS
1.0000 | ORAL_TABLET | Freq: Two times a day (BID) | ORAL | Status: DC
  Administered 2017-09-01 (×2): 8.6 mg via ORAL
  Filled 2017-09-01 (×2): qty 1

## 2017-09-01 MED ORDER — HYDROMORPHONE HCL 1 MG/ML IJ SOLN
0.5000 mg | INTRAMUSCULAR | Status: DC | PRN
  Administered 2017-09-01: 0.5 mg via INTRAVENOUS
  Administered 2017-09-01 – 2017-09-02 (×3): 1 mg via INTRAVENOUS
  Filled 2017-09-01: qty 1
  Filled 2017-09-01: qty 0.5
  Filled 2017-09-01 (×2): qty 1

## 2017-09-01 MED ORDER — ORAL CARE MOUTH RINSE: 15.0000 mL | Freq: Two times a day (BID) | OROMUCOSAL | Status: DC

## 2017-09-01 MED ORDER — GUAIFENESIN ER 600 MG PO TB12
600.0000 mg | ORAL_TABLET | Freq: Two times a day (BID) | ORAL | Status: DC
  Administered 2017-09-01 (×2): 600 mg via ORAL
  Filled 2017-09-01 (×2): qty 1

## 2017-09-01 MED ORDER — HYDROMORPHONE HCL 1 MG/ML IJ SOLN: 0.5000 mg | INTRAMUSCULAR | Status: DC | PRN

## 2017-09-01 MED ORDER — KETOROLAC TROMETHAMINE 30 MG/ML IJ SOLN
30.0000 mg | Freq: Four times a day (QID) | INTRAMUSCULAR | Status: DC
  Administered 2017-09-01 (×3): 30 mg via INTRAVENOUS
  Filled 2017-09-01 (×3): qty 1

## 2017-09-01 MED ORDER — CHLORHEXIDINE GLUCONATE 0.12 % MT SOLN
15.0000 mL | Freq: Two times a day (BID) | OROMUCOSAL | Status: DC
  Administered 2017-09-01 (×2): 15 mL via OROMUCOSAL
  Filled 2017-09-01 (×2): qty 15

## 2017-09-01 MED ORDER — ORAL CARE MOUTH RINSE
15.0000 mL | Freq: Two times a day (BID) | OROMUCOSAL | Status: DC
  Administered 2017-09-01 (×2): 15 mL via OROMUCOSAL

## 2017-09-01 NOTE — Progress Notes (Signed)
Subjective: Doing a little better this morning.  Transferred down to stepdown unit because of hypoxia. Still has right chest wall pain but alert.  Work of breathing a little less. SPO2 in the 90s on biopsy Pap. SPO2 low 90s on nonrebreather. Chest x-ray shows chronic interstitial lung disease and patchy atelectasis.  No pneumothorax.  We will get him out of bed, work on incentive spirometer.  Start diet  Objective: Vital signs in last 24 hours: Temp:  [98.7 F (37.1 C)-103.6 F (39.8 C)] 99.5 F (37.5 C) (11/22 0337) Pulse Rate:  [90-120] 116 (11/22 0800) Resp:  [14-28] 27 (11/22 0800) BP: (99-177)/(56-105) 106/73 (11/22 0800) SpO2:  [85 %-97 %] 90 % (11/22 0829) FiO2 (%):  [45 %-100 %] 80 % (11/22 0800) Weight:  [74.6 kg (164 lb 7.4 oz)] 74.6 kg (164 lb 7.4 oz) (11/21 2021) Last BM Date: 08/30/17  Intake/Output from previous day: 11/21 0701 - 11/22 0700 In: 1875.3 [P.O.:240; I.V.:1025.3; IV Piggyback:610] Out: 400 [Urine:400] Intake/Output this shift: No intake/output data recorded.  General appearance: Alert.  Cooperative.  Mental status normal.  Mild distress. Resp: Clear on left.  Rhonchi on right.  Tender right chest wall.  Slight crepitance. GI: soft, non-tender; bowel sounds normal; no masses,  no organomegaly  Lab Results:  Recent Labs    08/30/17 1835 09/01/17 0313  WBC 16.5* 12.8*  HGB 14.0 13.2  HCT 39.2 38.4*  PLT 209 174   BMET Recent Labs    08/30/17 1835 09/01/17 0313  NA 134* 134*  K 3.2* 4.0  CL 103 103  CO2 25 23  GLUCOSE 117* 93  BUN 6 10  CREATININE 0.98 1.14  CALCIUM 8.5* 8.4*   PT/INR No results for input(s): LABPROT, INR in the last 72 hours. ABG Recent Labs    08/31/17 2100 09/01/17 0438  PHART 7.319* 7.339*  HCO3 22.5 21.4    Studies/Results: Dg Chest 2 View  Result Date: 08/31/2017 CLINICAL DATA:  Patient fell from a ladder yesterday with known rib fractures. The patient has persistent shortness of breath and chest  discomfort. EXAM: CHEST  2 VIEW COMPARISON:  CT scan of the chest dated August 30, 2017 FINDINGS: There is a developed a 5 mm less apical pneumothorax on the right. There is subcutaneous emphysema inferiorly in the right axillary region. A displaced fracture of the lateral aspect of the right eighth rib is well demonstrated. Known fractures of lower right ribs are not evident on this study. There is a small right pleural effusion. On the left there is minimal basilar atelectasis but. No pneumothorax or significant pleural effusion. The heart and mediastinal structures are normal. There is calcification in the wall of the aortic arch. IMPRESSION: Interval development of a 5% right apical pneumothorax. Known multiple right posterolateral rib fractures. Bibasilar atelectasis. Small right pleural effusion. These results were called by telephone at the time of interpretation on 08/31/2017 at 11:44 am to Janetta Hora, RN,, who verbally acknowledged these results. Electronically Signed   By: David  Martinique M.D.   On: 08/31/2017 11:47   Ct Head Wo Contrast  Result Date: 08/30/2017 CLINICAL DATA:  [place] 10 feet off of a roof, cervical spine trauma high clinical risk, minor head trauma, GCS greater than or equal 13, complaining of pain EXAM: CT HEAD WITHOUT CONTRAST CT CERVICAL SPINE WITHOUT CONTRAST TECHNIQUE: Multidetector CT imaging of the head and cervical spine was performed following the standard protocol without intravenous contrast. Multiplanar CT image reconstructions of the cervical spine were  also generated. COMPARISON:  CT head 10/23/2009 FINDINGS: CT HEAD FINDINGS Brain: Generalized atrophy. Normal ventricular morphology. No midline shift or mass effect. Minimal small vessel chronic ischemic changes of deep cerebral white matter. No intracranial hemorrhage, mass lesion, or evidence acute infarction. No extra-axial fluid collections. Vascular: Atherosclerotic calcifications of internal carotid arteries  bilaterally at skullbase Skull: Intact Sinuses/Orbits: Partial opacification of RIGHT sphenoid sinus slightly increased since previous exam question mucosal retention cyst. Remaining visualized paranasal sinuses and mastoid air cells clear Other: N/A CT CERVICAL SPINE FINDINGS Alignment: Minimal retrolisthesis at C3-C4. Alignment otherwise normal. Skull base and vertebrae: Visualized skullbase intact. 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 Osseous mineralization normal. Vertebral body heights maintained without fracture or subluxation. No bone destruction. Dextroconvex cervical scoliosis. Mild scattered facet degenerative changes. Soft tissues and spinal canal: Prevertebral soft tissues normal thickness. 9 mm RIGHT thyroid nodule. Remaining visualized cervical soft tissues unremarkable. Incidentally noted atherosclerotic calcifications of the carotid bifurcations. Disc levels: Disc space narrowing C3-C4. No definite spinal stenosis. Upper chest: Lung apices clear Other: N/A IMPRESSION: Atrophy with minimal small vessel chronic ischemic changes of deep cerebral white matter. No acute intracranial abnormalities. Congenital fusion C4-C5. No acute cervical spine abnormalities. Electronically Signed   By: Lavonia Dana M.D.   On: 08/30/2017 21:33   Ct Chest W Contrast  Addendum Date: 08/30/2017   ADDENDUM REPORT: 08/30/2017 22:18 ADDENDUM: Comparison is made to multiple prior chest CTs, ranging from 05/26/2017 back to 01/16/2014. The spiculated 8 mm diameter nodular density does not appear significantly changed versus the earlier study of 05/26/2017. When reviewed back to earlier exams, this area of questioned nodularity has significantly decreased in size since 01/16/2014 indicating this is most likely representing scarring. The second 3 mm nodular density in the lateral mid RIGHT lung has not significantly changed since 10/30/2014, also favoring a benign etiology. Per the  recommendation of the most recent screening chest CT from August 2018, recommend continued annual low-dose screening CT chest to demonstrate continued stability of the slightly irregular 8 mm nodular density. Electronically Signed   By: Lavonia Dana M.D.   On: 08/30/2017 22:18   Result Date: 08/30/2017 CLINICAL DATA:  [place] 10 foot from a roof today, complaining of RIGHT rib pain, history asthma, COPD, coronary artery disease EXAM: CT CHEST, ABDOMEN, AND PELVIS WITH CONTRAST TECHNIQUE: Multidetector CT imaging of the chest, abdomen and pelvis was performed following the standard protocol during bolus administration of intravenous contrast. Sagittal and coronal MPR images reconstructed from axial data set. CONTRAST:  100 mL ISOVUE-300 IOPAMIDOL (ISOVUE-300) INJECTION 61% IV. No oral contrast. COMPARISON:  CT chest 05/26/2017 FINDINGS: CT CHEST FINDINGS Cardiovascular: Atherosclerotic calcifications aorta, proximal great vessels and coronary arteries. Aorta normal caliber without aneurysm or peri-aortic infiltration/hemorrhage. No pericardial effusion. Pulmonary arteries grossly patent on nondedicated exam. Mediastinum/Nodes: Base of cervical region normal appearance. Few normal size mediastinal and hilar lymph nodes without thoracic adenopathy. Esophagus unremarkable. Lungs/Pleura: Dependent atelectasis in BILATERAL lower lobes. 3 mm RIGHT lung nodule image 32. Emphysematous changes greatest at apices. Small slightly irregular nodular density RIGHT upper lobe 8 mm diameter image 63. Remaining lungs clear. Scattered tiny foci of pneumothorax in the RIGHT hemithorax. Musculoskeletal: Fractures of the RIGHT eighth, ninth, tenth, and eleventh ribs. Foci of chest wall pneumatosis adjacent to rib fractures. CT ABDOMEN PELVIS FINDINGS Hepatobiliary: Liver normal appearance. Dependent calcified gallstone in gallbladder Pancreas: Normal appearance Spleen: Normal appearance Adrenals/Urinary Tract: Adrenal glands normal  appearance. Kidneys, ureters and bladder normal  appearance. Stomach/Bowel: Normal appendix. Stomach and bowel loops normal appearance. Vascular/Lymphatic: Atherosclerotic calcifications of aorta and iliac arteries without aneurysm. Calcified plaque at proximal SMA. No adenopathy. Reproductive: Prostatic enlargement, gland measuring 5.5 x 4.3 x 4.6 cm (volume = 57 cm^3). Seminal vesicles unremarkable. Other: Small BILATERAL inguinal hernias containing fat larger on RIGHT. No free intraperitoneal air or fluid. Musculoskeletal: Demineralized without acute abnormalities. IMPRESSION: Fractures of the RIGHT eighth ninth tenth and eleventh ribs. Dependent atelectasis in BILATERAL lower lobes. Tiny foci of RIGHT pneumothorax. No acute intra-abdominal or intrapelvic abnormalities. Prostatic enlargement. Small BILATERAL inguinal hernias. 3 mm nonspecific RIGHT lung nodule with an additional slightly irregular 8 mm diameter nodular density in the RIGHT upper lobe question nodule versus scarring ; recommendation below. Non-contrast chest CT at 3-6 months is recommended. If the nodules are stable at time of repeat CT, then future CT at 18-24 months (from today's scan) is considered optional for low-risk patients, but is recommended for high-risk patients. This recommendation follows the consensus statement: Guidelines for Management of Incidental Pulmonary Nodules Detected on CT Images: From the Fleischner Society 2017; Radiology 2017; 284:228-243. Aortic Atherosclerosis (ICD10-I70.0) and Emphysema (ICD10-J43.9). Findings called to Dr. Roxanne Mins On 70/62/3762 at 2152 hours. Electronically Signed: By: Lavonia Dana M.D. On: 08/30/2017 21:54   Ct Cervical Spine Wo Contrast  Result Date: 08/30/2017 CLINICAL DATA:  [place] 10 feet off of a roof, cervical spine trauma high clinical risk, minor head trauma, GCS greater than or equal 13, complaining of pain EXAM: CT HEAD WITHOUT CONTRAST CT CERVICAL SPINE WITHOUT CONTRAST TECHNIQUE:  Multidetector CT imaging of the head and cervical spine was performed following the standard protocol without intravenous contrast. Multiplanar CT image reconstructions of the cervical spine were also generated. COMPARISON:  CT head 10/23/2009 FINDINGS: CT HEAD FINDINGS Brain: Generalized atrophy. Normal ventricular morphology. No midline shift or mass effect. Minimal small vessel chronic ischemic changes of deep cerebral white matter. No intracranial hemorrhage, mass lesion, or evidence acute infarction. No extra-axial fluid collections. Vascular: Atherosclerotic calcifications of internal carotid arteries bilaterally at skullbase Skull: Intact Sinuses/Orbits: Partial opacification of RIGHT sphenoid sinus slightly increased since previous exam question mucosal retention cyst. Remaining visualized paranasal sinuses and mastoid air cells clear Other: N/A CT CERVICAL SPINE FINDINGS Alignment: Minimal retrolisthesis at C3-C4. Alignment otherwise normal. Skull base and vertebrae: Visualized skullbase intact. 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 Osseous mineralization normal. Vertebral body heights maintained without fracture or subluxation. No bone destruction. Dextroconvex cervical scoliosis. Mild scattered facet degenerative changes. Soft tissues and spinal canal: Prevertebral soft tissues normal thickness. 9 mm RIGHT thyroid nodule. Remaining visualized cervical soft tissues unremarkable. Incidentally noted atherosclerotic calcifications of the carotid bifurcations. Disc levels: Disc space narrowing C3-C4. No definite spinal stenosis. Upper chest: Lung apices clear Other: N/A IMPRESSION: Atrophy with minimal small vessel chronic ischemic changes of deep cerebral white matter. No acute intracranial abnormalities. Congenital fusion C4-C5. No acute cervical spine abnormalities. Electronically Signed   By: Lavonia Dana M.D.   On: 08/30/2017 21:33   Ct Abdomen Pelvis W  Contrast  Addendum Date: 08/30/2017   ADDENDUM REPORT: 08/30/2017 22:18 ADDENDUM: Comparison is made to multiple prior chest CTs, ranging from 05/26/2017 back to 01/16/2014. The spiculated 8 mm diameter nodular density does not appear significantly changed versus the earlier study of 05/26/2017. When reviewed back to earlier exams, this area of questioned nodularity has significantly decreased in size since 01/16/2014 indicating this is most likely representing scarring. The second 3 mm  nodular density in the lateral mid RIGHT lung has not significantly changed since 10/30/2014, also favoring a benign etiology. Per the recommendation of the most recent screening chest CT from August 2018, recommend continued annual low-dose screening CT chest to demonstrate continued stability of the slightly irregular 8 mm nodular density. Electronically Signed   By: Lavonia Dana M.D.   On: 08/30/2017 22:18   Result Date: 08/30/2017 CLINICAL DATA:  [place] 10 foot from a roof today, complaining of RIGHT rib pain, history asthma, COPD, coronary artery disease EXAM: CT CHEST, ABDOMEN, AND PELVIS WITH CONTRAST TECHNIQUE: Multidetector CT imaging of the chest, abdomen and pelvis was performed following the standard protocol during bolus administration of intravenous contrast. Sagittal and coronal MPR images reconstructed from axial data set. CONTRAST:  100 mL ISOVUE-300 IOPAMIDOL (ISOVUE-300) INJECTION 61% IV. No oral contrast. COMPARISON:  CT chest 05/26/2017 FINDINGS: CT CHEST FINDINGS Cardiovascular: Atherosclerotic calcifications aorta, proximal great vessels and coronary arteries. Aorta normal caliber without aneurysm or peri-aortic infiltration/hemorrhage. No pericardial effusion. Pulmonary arteries grossly patent on nondedicated exam. Mediastinum/Nodes: Base of cervical region normal appearance. Few normal size mediastinal and hilar lymph nodes without thoracic adenopathy. Esophagus unremarkable. Lungs/Pleura: Dependent  atelectasis in BILATERAL lower lobes. 3 mm RIGHT lung nodule image 32. Emphysematous changes greatest at apices. Small slightly irregular nodular density RIGHT upper lobe 8 mm diameter image 63. Remaining lungs clear. Scattered tiny foci of pneumothorax in the RIGHT hemithorax. Musculoskeletal: Fractures of the RIGHT eighth, ninth, tenth, and eleventh ribs. Foci of chest wall pneumatosis adjacent to rib fractures. CT ABDOMEN PELVIS FINDINGS Hepatobiliary: Liver normal appearance. Dependent calcified gallstone in gallbladder Pancreas: Normal appearance Spleen: Normal appearance Adrenals/Urinary Tract: Adrenal glands normal appearance. Kidneys, ureters and bladder normal appearance. Stomach/Bowel: Normal appendix. Stomach and bowel loops normal appearance. Vascular/Lymphatic: Atherosclerotic calcifications of aorta and iliac arteries without aneurysm. Calcified plaque at proximal SMA. No adenopathy. Reproductive: Prostatic enlargement, gland measuring 5.5 x 4.3 x 4.6 cm (volume = 57 cm^3). Seminal vesicles unremarkable. Other: Small BILATERAL inguinal hernias containing fat larger on RIGHT. No free intraperitoneal air or fluid. Musculoskeletal: Demineralized without acute abnormalities. IMPRESSION: Fractures of the RIGHT eighth ninth tenth and eleventh ribs. Dependent atelectasis in BILATERAL lower lobes. Tiny foci of RIGHT pneumothorax. No acute intra-abdominal or intrapelvic abnormalities. Prostatic enlargement. Small BILATERAL inguinal hernias. 3 mm nonspecific RIGHT lung nodule with an additional slightly irregular 8 mm diameter nodular density in the RIGHT upper lobe question nodule versus scarring ; recommendation below. Non-contrast chest CT at 3-6 months is recommended. If the nodules are stable at time of repeat CT, then future CT at 18-24 months (from today's scan) is considered optional for low-risk patients, but is recommended for high-risk patients. This recommendation follows the consensus statement:  Guidelines for Management of Incidental Pulmonary Nodules Detected on CT Images: From the Fleischner Society 2017; Radiology 2017; 284:228-243. Aortic Atherosclerosis (ICD10-I70.0) and Emphysema (ICD10-J43.9). Findings called to Dr. Roxanne Mins On 62/83/1517 at 2152 hours. Electronically Signed: By: Lavonia Dana M.D. On: 08/30/2017 21:54   Dg Chest Port 1 View  Result Date: 09/01/2017 CLINICAL DATA:  Pain following fall EXAM: PORTABLE CHEST 1 VIEW COMPARISON:  August 31, 2017 chest radiograph; chest CT August 30, 2017 FINDINGS: Subcutaneous air is noted on the right. No evident pneumothorax. Rib fractures are present on the right, better appreciated on recent CT. There is patchy atelectasis in both lung bases with small pleural effusions bilaterally. Heart is upper normal in size with pulmonary vascularity within normal limits. No adenopathy. There  is aortic atherosclerosis. IMPRESSION: Subcutaneous air on the right without demonstrable pneumothorax. Small pleural effusions bilaterally. Areas of patchy atelectasis in lung bases, more on the right than on the left. There may be a degree of underlying contusion in the right base. Cardiac silhouette is stable.  There is aortic atherosclerosis. Aortic Atherosclerosis (ICD10-I70.0). Electronically Signed   By: Lowella Grip III M.D.   On: 09/01/2017 07:52   Dg Chest Port 1 View  Result Date: 08/31/2017 CLINICAL DATA:  Respiratory distress. New fever. Diagnosed with small right apical pneumothorax this morning. Multiple right rib fractures. Basilar atelectasis and small pleural effusion. EXAM: PORTABLE CHEST 1 VIEW COMPARISON:  08/31/2017 FINDINGS: Shallow inspiration with linear atelectasis in the lung bases. Cardiac enlargement without vascular congestion. No edema. Fluid or thickened pleura in the right costophrenic angle. The right pneumothorax seen previously is not demonstrated on the current examination. Subcutaneous emphysema in the right lateral chest  wall. Right rib fractures. Calcification of the aorta. IMPRESSION: Shallow inspiration with atelectasis in the lung bases. Fluid or thickened pleura in the right costophrenic angle. Pneumothorax seen previously is not well demonstrated, likely due to technique. Subcutaneous emphysema in the right chest wall. Right rib fractures. Aortic atherosclerosis. No significant progression since the previous study. Electronically Signed   By: Lucienne Capers M.D.   On: 08/31/2017 21:28    Anti-infectives: Anti-infectives (From admission, onward)   None      Assessment/Plan:   Fall from ladder 10 feet Multiple right rib fractures.- On Robaxin.  Needs a little more Dilaudid this morning. Very tiny right pneumothorax.  Not seen on chest x-ray today. COPD and tobacco abuse.-Continue Spiriva and Dulera;  Duonebs.     Add expectorants Acute hypoxia last night.  .  Seems to be stabilizing.  No indication for intubation at present.  Trying to transition to  NRB.  Up to chair.  Incentive spirometry.  Begin diet Pulmonary nodules   LOS: 1 day    Adin Hector 09/01/2017

## 2017-09-02 ENCOUNTER — Inpatient Hospital Stay (HOSPITAL_COMMUNITY): Payer: 59

## 2017-09-02 DIAGNOSIS — S2241XA Multiple fractures of ribs, right side, initial encounter for closed fracture: Secondary | ICD-10-CM

## 2017-09-02 DIAGNOSIS — R0603 Acute respiratory distress: Secondary | ICD-10-CM

## 2017-09-02 DIAGNOSIS — S270XXA Traumatic pneumothorax, initial encounter: Principal | ICD-10-CM

## 2017-09-02 DIAGNOSIS — W11XXXA Fall on and from ladder, initial encounter: Secondary | ICD-10-CM

## 2017-09-02 LAB — BLOOD GAS, ARTERIAL
ACID-BASE DEFICIT: 3.3 mmol/L — AB (ref 0.0–2.0)
Bicarbonate: 21.7 mmol/L (ref 20.0–28.0)
DELIVERY SYSTEMS: POSITIVE
DRAWN BY: 347621
EXPIRATORY PAP: 5
FIO2: 0.6
Inspiratory PAP: 10
LHR: 8 {breaths}/min
O2 Saturation: 97.7 %
PH ART: 7.296 — AB (ref 7.350–7.450)
Patient temperature: 102.7
pCO2 arterial: 47.5 mmHg (ref 32.0–48.0)
pO2, Arterial: 118 mmHg — ABNORMAL HIGH (ref 83.0–108.0)

## 2017-09-02 LAB — CORTISOL: CORTISOL PLASMA: 26.1 ug/dL

## 2017-09-02 LAB — POCT I-STAT 3, ART BLOOD GAS (G3+)
Acid-base deficit: 8 mmol/L — ABNORMAL HIGH (ref 0.0–2.0)
Acid-base deficit: 9 mmol/L — ABNORMAL HIGH (ref 0.0–2.0)
BICARBONATE: 19.8 mmol/L — AB (ref 20.0–28.0)
BICARBONATE: 18.1 mmol/L — AB (ref 20.0–28.0)
O2 SAT: 100 %
O2 Saturation: 96 %
PCO2 ART: 41.2 mmHg (ref 32.0–48.0)
PO2 ART: 91 mmHg (ref 83.0–108.0)
Patient temperature: 98.3
Patient temperature: 98.6
TCO2: 21 mmol/L — AB (ref 22–32)
TCO2: 19 mmol/L — ABNORMAL LOW (ref 22–32)
pCO2 arterial: 47.4 mmHg (ref 32.0–48.0)
pH, Arterial: 7.228 — ABNORMAL LOW (ref 7.350–7.450)
pH, Arterial: 7.252 — ABNORMAL LOW (ref 7.350–7.450)
pO2, Arterial: 211 mmHg — ABNORMAL HIGH (ref 83.0–108.0)

## 2017-09-02 LAB — CBC
HEMATOCRIT: 36.6 % — AB (ref 39.0–52.0)
Hemoglobin: 12.6 g/dL — ABNORMAL LOW (ref 13.0–17.0)
MCH: 29.4 pg (ref 26.0–34.0)
MCHC: 34.4 g/dL (ref 30.0–36.0)
MCV: 85.5 fL (ref 78.0–100.0)
Platelets: 142 10*3/uL — ABNORMAL LOW (ref 150–400)
RBC: 4.28 MIL/uL (ref 4.22–5.81)
RDW: 12.9 % (ref 11.5–15.5)
WBC: 5.6 10*3/uL (ref 4.0–10.5)

## 2017-09-02 LAB — URINALYSIS, ROUTINE W REFLEX MICROSCOPIC
BACTERIA UA: NONE SEEN
BILIRUBIN URINE: NEGATIVE
Glucose, UA: NEGATIVE mg/dL
KETONES UR: NEGATIVE mg/dL
LEUKOCYTES UA: NEGATIVE
Nitrite: NEGATIVE
PROTEIN: 30 mg/dL — AB
SQUAMOUS EPITHELIAL / LPF: NONE SEEN
Specific Gravity, Urine: 1.02 (ref 1.005–1.030)
pH: 5 (ref 5.0–8.0)

## 2017-09-02 LAB — BASIC METABOLIC PANEL
ANION GAP: 7 (ref 5–15)
BUN: 18 mg/dL (ref 6–20)
CALCIUM: 8.6 mg/dL — AB (ref 8.9–10.3)
CO2: 23 mmol/L (ref 22–32)
Chloride: 104 mmol/L (ref 101–111)
Creatinine, Ser: 1.45 mg/dL — ABNORMAL HIGH (ref 0.61–1.24)
GFR calc Af Amer: 58 mL/min — ABNORMAL LOW (ref >60)
GFR calc non Af Amer: 50 mL/min — ABNORMAL LOW (ref >60)
GLUCOSE: 109 mg/dL — AB (ref 65–99)
Potassium: 3.6 mmol/L (ref 3.5–5.1)
Sodium: 134 mmol/L — ABNORMAL LOW (ref 135–145)

## 2017-09-02 LAB — GLUCOSE, CAPILLARY
GLUCOSE-CAPILLARY: 109 mg/dL — AB (ref 65–99)
Glucose-Capillary: 104 mg/dL — ABNORMAL HIGH (ref 65–99)
Glucose-Capillary: 101 mg/dL — ABNORMAL HIGH (ref 65–99)

## 2017-09-02 LAB — PROCALCITONIN: PROCALCITONIN: 58.37 ng/mL

## 2017-09-02 LAB — MAGNESIUM
Magnesium: 1.3 mg/dL — ABNORMAL LOW (ref 1.7–2.4)
Magnesium: 1.4 mg/dL — ABNORMAL LOW (ref 1.7–2.4)

## 2017-09-02 LAB — LACTIC ACID, PLASMA: LACTIC ACID, VENOUS: 3.2 mmol/L — AB (ref 0.5–1.9)

## 2017-09-02 LAB — PHOSPHORUS
PHOSPHORUS: 2.8 mg/dL (ref 2.5–4.6)
Phosphorus: 2.5 mg/dL (ref 2.5–4.6)

## 2017-09-02 MED ORDER — CEFEPIME HCL 1 G IJ SOLR
1.0000 g | Freq: Three times a day (TID) | INTRAMUSCULAR | Status: DC
  Administered 2017-09-02: 1 g via INTRAVENOUS
  Filled 2017-09-02 (×2): qty 1

## 2017-09-02 MED ORDER — DEXTROSE-NACL 5-0.45 % IV SOLN
INTRAVENOUS | Status: DC
  Administered 2017-09-02: 07:00:00 via INTRAVENOUS

## 2017-09-02 MED ORDER — ETOMIDATE 2 MG/ML IV SOLN
0.3000 mg/kg | Freq: Once | INTRAVENOUS | Status: AC
  Administered 2017-09-02: 23.1 mg via INTRAVENOUS

## 2017-09-02 MED ORDER — SODIUM CHLORIDE 0.9 % IV BOLUS (SEPSIS)
500.0000 mL | Freq: Once | INTRAVENOUS | Status: AC
  Administered 2017-09-02: 500 mL via INTRAVENOUS

## 2017-09-02 MED ORDER — FENTANYL 2500MCG IN NS 250ML (10MCG/ML) PREMIX INFUSION
25.0000 ug/h | INTRAVENOUS | Status: DC
  Administered 2017-09-02: 50 ug/h via INTRAVENOUS
  Administered 2017-09-02: 200 ug/h via INTRAVENOUS
  Administered 2017-09-03 – 2017-09-06 (×8): 250 ug/h via INTRAVENOUS
  Administered 2017-09-06: 200 ug/h via INTRAVENOUS
  Administered 2017-09-07: 175 ug/h via INTRAVENOUS
  Administered 2017-09-07: 225 ug/h via INTRAVENOUS
  Administered 2017-09-07: 200 ug/h via INTRAVENOUS
  Administered 2017-09-08: 225 ug/h via INTRAVENOUS
  Administered 2017-09-08: 350 ug/h via INTRAVENOUS
  Administered 2017-09-08: 300 ug/h via INTRAVENOUS
  Administered 2017-09-09: 350 ug/h via INTRAVENOUS
  Administered 2017-09-09: 300 ug/h via INTRAVENOUS
  Filled 2017-09-02 (×18): qty 250

## 2017-09-02 MED ORDER — VITAL HIGH PROTEIN PO LIQD: 1000.0000 mL | ORAL | Status: DC

## 2017-09-02 MED ORDER — CHLORHEXIDINE GLUCONATE CLOTH 2 % EX PADS
6.0000 | MEDICATED_PAD | Freq: Every day | CUTANEOUS | Status: DC
  Administered 2017-09-03: 6 via TOPICAL

## 2017-09-02 MED ORDER — ACETAMINOPHEN 650 MG RE SUPP
325.0000 mg | Freq: Once | RECTAL | Status: AC
  Administered 2017-09-02: 325 mg via RECTAL
  Filled 2017-09-02: qty 1

## 2017-09-02 MED ORDER — METOPROLOL TARTRATE 5 MG/5ML IV SOLN
5.0000 mg | Freq: Once | INTRAVENOUS | Status: AC
  Administered 2017-09-02: 5 mg via INTRAVENOUS
  Filled 2017-09-02: qty 5

## 2017-09-02 MED ORDER — SODIUM CHLORIDE 0.9% FLUSH: 10.0000 mL | INTRAVENOUS | Status: DC | PRN

## 2017-09-02 MED ORDER — FENTANYL CITRATE (PF) 100 MCG/2ML IJ SOLN
50.0000 ug | Freq: Once | INTRAMUSCULAR | Status: AC
Start: 2017-09-02 — End: 2017-09-02
  Administered 2017-09-02: 100 ug via INTRAVENOUS

## 2017-09-02 MED ORDER — PRO-STAT SUGAR FREE PO LIQD
30.0000 mL | Freq: Two times a day (BID) | ORAL | Status: DC
  Filled 2017-09-02: qty 30

## 2017-09-02 MED ORDER — DEXTROSE-NACL 5-0.9 % IV SOLN
INTRAVENOUS | Status: DC
  Administered 2017-09-02 – 2017-09-03 (×2): via INTRAVENOUS

## 2017-09-02 MED ORDER — FENTANYL BOLUS VIA INFUSION
50.0000 ug | INTRAVENOUS | Status: DC | PRN
  Administered 2017-09-02: 50 ug via INTRAVENOUS
  Administered 2017-09-02: 100 ug via INTRAVENOUS
  Administered 2017-09-03 – 2017-09-06 (×7): 50 ug via INTRAVENOUS

## 2017-09-02 MED ORDER — MIDAZOLAM HCL 2 MG/2ML IJ SOLN
2.0000 mg | INTRAMUSCULAR | Status: DC | PRN
  Administered 2017-09-02 – 2017-09-05 (×2): 2 mg via INTRAVENOUS
  Filled 2017-09-02 (×6): qty 2

## 2017-09-02 MED ORDER — CHLORHEXIDINE GLUCONATE 0.12% ORAL RINSE (MEDLINE KIT)
15.0000 mL | Freq: Two times a day (BID) | OROMUCOSAL | Status: DC
  Administered 2017-09-02 – 2017-09-17 (×28): 15 mL via OROMUCOSAL

## 2017-09-02 MED ORDER — SODIUM CHLORIDE 0.9 % IV SOLN
0.0000 ug/min | INTRAVENOUS | Status: DC
  Administered 2017-09-02: 140 ug/min via INTRAVENOUS
  Administered 2017-09-03: 130 ug/min via INTRAVENOUS
  Filled 2017-09-02: qty 4
  Filled 2017-09-02: qty 40
  Filled 2017-09-02: qty 4
  Filled 2017-09-02: qty 40

## 2017-09-02 MED ORDER — PANTOPRAZOLE SODIUM 40 MG IV SOLR
40.0000 mg | INTRAVENOUS | Status: DC
  Administered 2017-09-02 – 2017-09-13 (×12): 40 mg via INTRAVENOUS
  Filled 2017-09-02 (×12): qty 40

## 2017-09-02 MED ORDER — ACETAMINOPHEN 650 MG RE SUPP
650.0000 mg | Freq: Four times a day (QID) | RECTAL | Status: DC | PRN
  Administered 2017-09-02: 650 mg via RECTAL
  Filled 2017-09-02: qty 1

## 2017-09-02 MED ORDER — PIVOT 1.5 CAL PO LIQD
1000.0000 mL | ORAL | Status: DC
  Administered 2017-09-02 – 2017-09-12 (×14): 1000 mL
  Filled 2017-09-02 (×5): qty 1000

## 2017-09-02 MED ORDER — ORAL CARE MOUTH RINSE
15.0000 mL | Freq: Four times a day (QID) | OROMUCOSAL | Status: DC
  Administered 2017-09-02 – 2017-09-07 (×20): 15 mL via OROMUCOSAL

## 2017-09-02 MED ORDER — DEXTROSE 5 % IV SOLN
2.0000 g | Freq: Two times a day (BID) | INTRAVENOUS | Status: DC
  Administered 2017-09-02 – 2017-09-03 (×2): 2 g via INTRAVENOUS
  Filled 2017-09-02 (×3): qty 2

## 2017-09-02 MED ORDER — ETOMIDATE 2 MG/ML IV SOLN: 0.3000 mg/kg | Freq: Once | INTRAVENOUS | Status: AC

## 2017-09-02 MED ORDER — SODIUM CHLORIDE 0.9% FLUSH
10.0000 mL | Freq: Two times a day (BID) | INTRAVENOUS | Status: DC
  Administered 2017-09-02 – 2017-09-03 (×2): 30 mL
  Administered 2017-09-04: 10 mL
  Administered 2017-09-04: 30 mL
  Administered 2017-09-05 – 2017-09-06 (×4): 10 mL
  Administered 2017-09-07: 30 mL
  Administered 2017-09-08: 10 mL
  Administered 2017-09-08: 20 mL
  Administered 2017-09-09 – 2017-09-13 (×9): 10 mL
  Administered 2017-09-14: 20 mL
  Administered 2017-09-15 – 2017-09-17 (×5): 10 mL

## 2017-09-02 MED ORDER — ETOMIDATE 2 MG/ML IV SOLN
INTRAVENOUS | Status: AC
  Administered 2017-09-02: 20 mg
  Filled 2017-09-02: qty 10

## 2017-09-02 MED ORDER — BUDESONIDE 0.5 MG/2ML IN SUSP
0.5000 mg | Freq: Two times a day (BID) | RESPIRATORY_TRACT | Status: DC
  Administered 2017-09-02 – 2017-09-17 (×30): 0.5 mg via RESPIRATORY_TRACT
  Filled 2017-09-02 (×31): qty 2

## 2017-09-02 MED ORDER — SODIUM CHLORIDE 0.9 % IV SOLN
0.0000 ug/min | INTRAVENOUS | Status: DC
  Administered 2017-09-02: 10 ug/min via INTRAVENOUS
  Administered 2017-09-02: 150 ug/min via INTRAVENOUS
  Filled 2017-09-02 (×3): qty 1

## 2017-09-02 MED ORDER — VANCOMYCIN HCL IN DEXTROSE 1-5 GM/200ML-% IV SOLN
1000.0000 mg | Freq: Two times a day (BID) | INTRAVENOUS | Status: DC
  Administered 2017-09-02 – 2017-09-03 (×4): 1000 mg via INTRAVENOUS
  Filled 2017-09-02 (×5): qty 200

## 2017-09-02 MED ORDER — HYDROMORPHONE HCL 1 MG/ML IJ SOLN
0.5000 mg | INTRAMUSCULAR | Status: DC | PRN
  Administered 2017-09-02: 1 mg via INTRAVENOUS
  Filled 2017-09-02: qty 1

## 2017-09-02 MED ORDER — SODIUM CHLORIDE 0.9 % IV BOLUS (SEPSIS)
1000.0000 mL | INTRAVENOUS | Status: AC
  Administered 2017-09-02 (×2): 1000 mL via INTRAVENOUS

## 2017-09-02 MED ORDER — MIDAZOLAM HCL 2 MG/2ML IJ SOLN
2.0000 mg | INTRAMUSCULAR | Status: DC | PRN
  Administered 2017-09-02 – 2017-09-10 (×15): 2 mg via INTRAVENOUS
  Filled 2017-09-02 (×10): qty 2

## 2017-09-02 MED ORDER — ACETAMINOPHEN 160 MG/5ML PO SOLN
650.0000 mg | Freq: Four times a day (QID) | ORAL | Status: DC | PRN
  Administered 2017-09-02 – 2017-09-15 (×12): 650 mg
  Filled 2017-09-02 (×13): qty 20.3

## 2017-09-02 MED ORDER — IPRATROPIUM-ALBUTEROL 0.5-2.5 (3) MG/3ML IN SOLN
3.0000 mL | RESPIRATORY_TRACT | Status: DC | PRN
  Administered 2017-09-02: 3 mL via RESPIRATORY_TRACT
  Filled 2017-09-02: qty 3

## 2017-09-02 NOTE — Progress Notes (Signed)
CRITICAL VALUE ALERT  Critical Value:  Lactic Acid 3.2  Date & Time Notied:  09/02/17 1227  Provider Notified: CCM   Orders Received/Actions taken: will continue to monitor, central line will be placed

## 2017-09-02 NOTE — Consult Note (Signed)
PULMONARY / CRITICAL CARE MEDICINE   Name: Derrick Sheppard MRN: [id number] DOB: [birth year]    ADMISSION DATE:  08/30/2017 CONSULTATION DATE:  09/02/2017  REFERRING MD:  Trauma Dr. Brantley Stage  CHIEF COMPLAINT:  hypoxia  HISTORY OF PRESENT ILLNESS:   61 year old male with PMH as below, which is significant for COPD, CAD, and GERD who was admitted to [facility] 11/20 after fall from 10 ft ladder. He was found to have multiple right sided rib fractures and a small pneumothorax. He was admitted to floor by trauma service for monitoring. He developed hypoxemia was unable to use IS due to pain. He was transferred to SDU for BiPAP. Then developed lethargy. CT scan of the chest demonstrated worsening PTX on 11/23 and chest tube was placed. He was transferred to ICU and intubated upon arrival. PCCM consulted.   PAST MEDICAL HISTORY :  He  has a past medical history of Allergic state, Arthritis, Asthma, B12 deficiency, COPD (chronic obstructive pulmonary disease) ([place]), Coronary artery disease, Dysrhythmia, Edema, GERD (gastroesophageal reflux disease), Headache, Palpitations, and Pulmonary nodules.  PAST SURGICAL HISTORY: He  has a past surgical history that includes Colonoscopy and Colonoscopy with propofol (N/A, 04/09/2016).  Allergies  Allergen Reactions  . Amlodipine Swelling    Makes his ankles and legs swell.  . Codeine Other (See Comments)    Gives him a Headache.    No current facility-administered medications on file prior to encounter.    Current Outpatient Medications on File Prior to Encounter  Medication Sig  . albuterol (PROVENTIL HFA;VENTOLIN HFA) 108 (90 Base) MCG/ACT inhaler Inhale 2 puffs into the lungs every 6 (six) hours as needed for wheezing or shortness of breath.  Marland Kitchen aspirin EC 81 MG tablet Take 81 mg by mouth daily.  . B Complex-C (B-COMPLEX WITH VITAMIN C) tablet Take 1 tablet by mouth daily.  . Cholecalciferol 1000 units TBDP Take 1,000 Units/day by mouth daily.  Marland Kitchen  esomeprazole (NEXIUM) 40 MG capsule Take 40 mg by mouth daily at 12 noon.  . Fluticasone-Salmeterol (ADVAIR) 250-50 MCG/DOSE AEPB Inhale 1 puff into the lungs 2 (two) times daily.  Marland Kitchen ibuprofen (ADVIL,MOTRIN) 200 MG tablet Take 600 mg by mouth every 4 (four) hours as needed.   Marland Kitchen losartan (COZAAR) 50 MG tablet Take 50 mg by mouth daily.  Marland Kitchen tiotropium (SPIRIVA) 18 MCG inhalation capsule Place 18 mcg into inhaler and inhale daily.    FAMILY HISTORY:  His has no family status information on file.    SOCIAL HISTORY: He  reports that he has been smoking cigarettes.  He has a 42.00 pack-year smoking history. he has never used smokeless tobacco. He reports that he does not drink alcohol or use drugs.  REVIEW OF SYSTEMS:   Unable as patient is encephalopathic.   SUBJECTIVE:  Distress ett  VITAL SIGNS: BP (!) 80/57   Pulse (!) 112   Temp 98.8 F (37.1 C) (Axillary)   Resp (!) 25   Ht 5' 7"  (1.702 m)   Wt 77 kg (169 lb 12.1 oz)   SpO2 (!) 86%   BMI 26.59 kg/m   HEMODYNAMICS:    VENTILATOR SETTINGS: Vent Mode: PRVC FiO2 (%):  [60 %-100 %] 100 % Set Rate:  [22 bmp] 22 bmp Vt Set:  [550 mL] 550 mL PEEP:  [8 cmH20] 8 cmH20 Plateau Pressure:  [20 cmH20] 20 cmH20  INTAKE / OUTPUT: I/O last 3 completed shifts: In: 2663.7 [P.O.:480; I.V.:1263.7; IV Piggyback:920] Out: 650 [Urine:650]  PHYSICAL  EXAMINATION: General:  Elderly male on vent in NAD Neuro:  Sedated HEENT:  [place]/AT, PERRL, no JVD Cardiovascular:  RRR, no MRG Lungs:  Diminished bases otherwise clear.  Abdomen:  Soft, non-distened Musculoskeletal:  No acute deforimty Skin:  Grossly intact.   LABS:  BMET Recent Labs  Lab 08/30/17 1835 09/01/17 0313 09/02/17 0415  NA 134* 134* 134*  K 3.2* 4.0 3.6  CL 103 103 104  CO2 25 23 23   BUN 6 10 18   CREATININE 0.98 1.14 1.45*  GLUCOSE 117* 93 109*    Electrolytes Recent Labs  Lab 08/30/17 1835 09/01/17 0313 09/02/17 0415  CALCIUM 8.5* 8.4* 8.6*     CBC Recent Labs  Lab 08/30/17 1835 09/01/17 0313 09/02/17 0415  WBC 16.5* 12.8* 5.6  HGB 14.0 13.2 12.6*  HCT 39.2 38.4* 36.6*  PLT 209 174 142*    Coag's No results for input(s): APTT, INR in the last 168 hours.  Sepsis Markers Recent Labs  Lab 08/30/17 1858  LATICACIDVEN 1.15    ABG Recent Labs  Lab 08/31/17 2100 09/01/17 0438 09/02/17 0445  PHART 7.319* 7.339* 7.296*  PCO2ART 45.2 40.9 47.5  PO2ART 59.8* 62.3* 118*    Liver Enzymes Recent Labs  Lab 08/30/17 1835  AST 23  ALT 15*  ALKPHOS 159*  BILITOT 0.8  ALBUMIN 3.5    Cardiac Enzymes No results for input(s): TROPONINI, PROBNP in the last 168 hours.  Glucose No results for input(s): GLUCAP in the last 168 hours.  Imaging Ct Chest Wo Contrast  Result Date: 09/02/2017 CLINICAL DATA:  [place] from ladder 3 days ago. Multiple right rib fractures with small pneumothorax and pulmonary contusion. Increasing subcutaneous air in pneumothorax on the right side. Fever. EXAM: CT CHEST WITHOUT CONTRAST TECHNIQUE: Multidetector CT imaging of the chest was performed following the standard protocol without IV contrast. COMPARISON:  08/30/2017 FINDINGS: Cardiovascular: Normal heart size. No pericardial effusion. Normal caliber thoracic aorta. Aortic and coronary artery calcifications. Mediastinum/Nodes: Pneumomediastinum extending down from the neck and involving the anterior mediastinum. Scattered lymph nodes in the mediastinum are not pathologically enlarged and likely reactive. Esophagus is decompressed. Lungs/Pleura: Small right pneumothorax is increasing since previous study. There is new collapse or consolidation of the right lung base. Patchy multifocal areas of airspace and interstitial infiltration in both lungs, demonstrating progression since previous study. This may represent pneumonia or edema. No pleural effusions. There is evidence of layering material within the trachea and main bronchi. This could  represent aspiration. Upper Abdomen: Increased density in the gallbladder likely representing vicarious excretion of previous contrast material. No acute process otherwise demonstrated in the upper abdomen. Musculoskeletal: There is extensive subcutaneous emphysema in the right side of the chest with extension to the left upper chest and neck.Multiple right rib fractures again demonstrated. Mild displacement. IMPRESSION: 1. Increasing right pneumothorax since previous study. 2. Extensive subcutaneous emphysema and pneumomediastinum, also increased since previous studies. 3. Increasing consolidation or volume loss in the right lung with patchy multifocal areas of airspace and interstitial infiltration in both lungs, demonstrating progression. This could represent pneumonia or edema. Layering material within the trachea could indicate aspiration pneumonia. 4. Increased density in the gallbladder likely represents vicarious excretion of previous contrast material. Electronically Signed   By: Lucienne Capers M.D.   On: 09/02/2017 06:30   Dg Chest Port 1 View  Result Date: 09/02/2017 CLINICAL DATA:  Rib fractures EXAM: PORTABLE CHEST 1 VIEW COMPARISON:  09/01/2017 FINDINGS: Significant increase in subcutaneous emphysema since previous study. Right  rib fractures are again demonstrated. Tiny right apical pneumothorax. This is more prominent than on yesterday's study. Normal heart size. Increasing perihilar infiltration and interstitial change in the lungs suggesting progressing edema or pneumonia. Aortic atherosclerosis. IMPRESSION: Progression since previous study with tiny but increased right apical pneumothorax and significant increase of subcutaneous emphysema. Increasing infiltration or edema in the lungs. Electronically Signed   By: Lucienne Capers M.D.   On: 09/02/2017 05:16   Dg Chest Port 1v Same Day  Result Date: 09/02/2017 CLINICAL DATA:  Chest tube placement. EXAM: PORTABLE CHEST 1 VIEW COMPARISON:   CT chest an one-view chest x-ray from earlier the same day. FINDINGS: The right lung is re-expanded following placement of a pigtail catheter chest tube. Right upper lobe airspace consolidation is again seen. Diffuse interstitial edema is present. Heart size is normal. The endotracheal tube terminates 5 cm above the carina, in satisfactory position. OG or NG tube is in place. The side port is above the GE junction and could be advanced 5-10 cm for more optimal positioning. Extensive right-sided subcutaneous emphysema is again noted. IMPRESSION: 1. Re-expansion of the right lung following placement of chest tube. 2. Diffuse interstitial edema and asymmetric right upper lobe airspace disease. While this may represent atelectasis, infection or aspiration is also considered. 3. Endotracheal tube in satisfactory position, 5 cm above the carina. 4. The side port of the NG tube is above the GE junction and could be advanced 5-10 cm for more optimal positioning. Electronically Signed   By: San Morelle M.D.   On: 09/02/2017 09:45   Dg Abd Portable 1v  Result Date: 09/02/2017 CLINICAL DATA:  OG tube placement. EXAM: PORTABLE ABDOMEN - 1 VIEW COMPARISON:  CT chest 09/02/2017 FINDINGS: Mildly dilated loops of small bowel are present centrally within the abdomen. Subcutaneous emphysema extends over the right side of the body. There is no free air. The side port above NG or OG tube is just above the GE junction and could be advanced for more optimal positioning. IMPRESSION: 1. Side port of the OG tube is just above the GE junction and could be advanced for more optimal positioning. 2. Mild dilation bowel in the central abdomen may represent focal ileus. No definite obstruction or free air is present. 3. Subcutaneous emphysema extends over the right side of the body. Electronically Signed   By: San Morelle M.D.   On: 09/02/2017 09:42     STUDIES:  CT head/Cspine 11/20 > Atrophy with minimal small vessel  chronic ischemic changes of deep cerebral white matter. No acute intracranial abnormalities. Congenital fusion C4-C5. No acute cervical spine abnormalities. CT chest/abd/pelv 11/20 > Fractures of the RIGHT eighth ninth tenth and eleventh ribs. Dependent atelectasis in BILATERAL lower lobes. Tiny foci of RIGHT pneumothorax. No acute intra-abdominal or intrapelvic abnormalities. Prostatic enlargement. Small BILATERAL inguinal hernias. 3 mm nonspecific RIGHT lung nodule with an additional slightly irregular 8 mm diameter nodular density in the RIGHT upper lobe question nodule versus scarring ; recommendation below.  CT chest 11/23 > Increasing right pneumothorax since previous study. Extensive subcutaneous emphysema and pneumomediastinum, also increased since previous studies. Increasing consolidation or volume loss in the right lung with patchy multifocal areas of airspace and interstitial infiltration in both lungs, demonstrating progression. This could represent pneumonia or edema. Layering material within the trachea could indicate aspiration pneumonia. ncreased density in the gallbladder likely represents vicarious excretion of previous contrast material.  CULTURES: Tracheal asp 11/23 > Blood 11/23 >  ANTIBIOTICS: Cefepime 11/23 >  Vanco 11/23 >  SIGNIFICANT EVENTS: 11/20 admit for fall and PTX 11/23 hypoxia, PTX worse. CT placed. Transfer to ICU intubated  LINES/TUBES: ETT 11/23 > Rt Chest tube 11/23 >  DISCUSSION: 61 year old male admitted with rib fx and PTX after fall 11/23. Developed hypoxia and lethargy and was transferred to ICU 11/23 for intubation. CT placed 11/23 as well after PTX failed to improve.   ASSESSMENT / PLAN:  PULMONARY A: Acute hypoxemic respiratory failure: ATX 2/2 rib fx vs Aspiration PNA COPD Right sided PTX Pulmonary nodules (stable on CT 11/23)  P:   Full vent support. Was needing ^PEEP, but now back to 5. FiO2 60% with sats low 90s  Follow ABG CXR in  AM VAP bundle CT to 20cmH2O Scheduled duoneb, budesonide  Outpatient follow up of pulm nodules.   CARDIOVASCULAR A:  Hypotension, likely sedation related.  Hx HTN  P:  ICU hemodynamic monitoring Holding home losartan  RENAL A:   AKI  P:   Toradol DC Follow BMP, UOP, I&O  GASTROINTESTINAL A:   GERD  P:   NPO for now Protonix IV  HEMATOLOGIC A:   Mild anemia  P:  Follow HGB  INFECTIOUS A:   Aspiration PNA vs HCAP (hosp day 3)  P:   Cefepime and vancomycin May need to broaden to cefepime to cover anaerobes if doesn't improve quickly Trend PCT   ENDOCRINE A:   No acute issues    P:     NEUROLOGIC A:   Acute metabolic encephalopathy - hypercarbic likely due to hypoventilation   P:   RASS goal: -1 to -2 Fentanyl infusion, versed PRN Daily WUA  FAMILY  - Updates: family updated bedside  - Inter-disciplinary family meet or Palliative Care meeting due by:  11/27   Georgann Housekeeper, AGACNP-BC [facility] Pager [phone] or [phone]  09/02/2017 10:49 AM   STAFF NOTE: Linwood Dibbles, MD FACP have personally reviewed patient's available data, including medical history, events of note, physical examination and test results as part of my evaluation. I have discussed with resident/NP and other care providers such as pharmacist, RN and RRT. In addition, I personally evaluated patient and elicited key findings of: not awake, moderate to severe distress, paradoxial breathing, jvd down, reduced BS rt ronchi, min crepitus neck rt , abdo obese, normal BS, no edema, no rash, pcxr which I reviewed showed rt sidr infiltrate, Ct I reviewed shows small ptc and infiltrate and air neck chest, repeat pcxr stat after ETT shows dense rt fissure infiltrate and resolved ptx with new chest tube pigtail, keep ct to suction, required emergent intubation, failed NIMV, has PNA, concern aspiration component and atx from rib fx, obtain sputum, UA,  BC, lactic, pct, UA, for beter anearobic coverage, change cefepime to ceftaz, maintain vanc, feed early after OGT advanced, drop IN BP, sirs, may need cvp and line, follow repeat bolus response, as far as vent, limit peep as able with PTX, peep is needed to 8 for now, start 8 cc/kg, no ards noted, abg to follow, slight rate increase with baseline failed NIMV ph, I udpated family at bedside in full The patient is critically ill with multiple organ systems failure and requires high complexity decision making for assessment and support, frequent evaluation and titration of therapies, application of advanced monitoring technologies and extensive interpretation of multiple databases.   Critical Care Time devoted to patient care services described in this note is 42 Minutes. This time reflects time of  care of this signee: Merrie Roof, MD FACP. This critical care time does not reflect procedure time, or teaching time or supervisory time of PA/NP/Med student/Med Resident etc but could involve care discussion time. Rest per NP/medical resident whose note is outlined above and that I agree with   [name]. Titus Mould, MD, [place] Pgr: [facility] 09/02/2017 11:05 AM

## 2017-09-02 NOTE — Progress Notes (Signed)
PT Cancellation Note  Patient Details Name: Derrick Sheppard MRN: [id number] DOB: 01/25/1956   Cancelled Treatment:    Reason Eval/Treat Not Completed: Patient not medically ready. Pt moving to ICU and in process of getting intubate. Acute PT to return as able when appropriate.   [name] [name] 09/02/2017, 8:39 AM   Kittie Plater, PT, DPT Pager #: [phone] Office #: [phone]

## 2017-09-02 NOTE — Progress Notes (Signed)
Patient ID: Derrick Sheppard, male   DOB: 08-06-1956, 61 y.o.   MRN: [id number] Febrile with tachycardia associated with it.  This is improving with tylenol. On bipap.  Decreased uop. Had been on toradol and not taking as much po.  abg with pco2 normal, po2 acceptable, some acidosis.  Chest xray with more subq air.  I think he likely has pna as source of fever and will start empiric abx now.  I am going to get noncontrast chest ct to reevaluate right side of chest and may end up needing pigtail catheter placed on that side if any significant ptx that is not really seen on the portable cxr.  There certainly is subq air that would make me concerned that is the case.  Will stop toradol and cozaar due to increasing creatinine now.  Start iv fluids now also.

## 2017-09-02 NOTE — Progress Notes (Signed)
Subjective: Febrile, tachycardic, worsening respiratory status overnight. ABG OK. Groggy this AM. Decreased UOP and increased cr- toradol and cozaar stopped, fluids added. CT shows increasing R ptx and subcu emphysema.   Objective: Vital signs in last 24 hours: Temp:  [98 F (36.7 C)-103.1 F (39.5 C)] 99.2 F (37.3 C) (11/23 0730) Pulse Rate:  [85-121] 103 (11/23 0730) Resp:  [10-34] 15 (11/23 0730) BP: (72-135)/(38-95) 89/62 (11/23 0730) SpO2:  [89 %-95 %] 91 % (11/23 0755) FiO2 (%):  [60 %-94 %] 60 % (11/23 0755) Last BM Date: 08/30/17  Intake/Output from previous day: 11/22 0701 - 11/23 0700 In: 1028.3 [P.O.:480; I.V.:238.3; IV Piggyback:310] Out: 250 [Urine:250] Intake/Output this shift: No intake/output data recorded.  General appearance: Somnolent, awakens to voice, answers some questions. BIPAP on.  Resp: Clear on left.  Rhonchi on right.  Tender right chest wall with crepitance. GI: soft, non-tender; bowel sounds normal; no masses,  no organomegaly  Lab Results:  Recent Labs    09/01/17 0313 09/02/17 0415  WBC 12.8* 5.6  HGB 13.2 12.6*  HCT 38.4* 36.6*  PLT 174 142*   BMET Recent Labs    09/01/17 0313 09/02/17 0415  NA 134* 134*  K 4.0 3.6  CL 103 104  CO2 23 23  GLUCOSE 93 109*  BUN 10 18  CREATININE 1.14 1.45*  CALCIUM 8.4* 8.6*   PT/INR No results for input(s): LABPROT, INR in the last 72 hours. ABG Recent Labs    09/01/17 0438 09/02/17 0445  PHART 7.339* 7.296*  HCO3 21.4 21.7    Studies/Results: Dg Chest 2 View  Result Date: 08/31/2017 CLINICAL DATA:  Patient fell from a ladder yesterday with known rib fractures. The patient has persistent shortness of breath and chest discomfort. EXAM: CHEST  2 VIEW COMPARISON:  CT scan of the chest dated August 30, 2017 FINDINGS: There is a developed a 5 mm less apical pneumothorax on the right. There is subcutaneous emphysema inferiorly in the right axillary region. A displaced fracture of the  lateral aspect of the right eighth rib is well demonstrated. Known fractures of lower right ribs are not evident on this study. There is a small right pleural effusion. On the left there is minimal basilar atelectasis but. No pneumothorax or significant pleural effusion. The heart and mediastinal structures are normal. There is calcification in the wall of the aortic arch. IMPRESSION: Interval development of a 5% right apical pneumothorax. Known multiple right posterolateral rib fractures. Bibasilar atelectasis. Small right pleural effusion. These results were called by telephone at the time of interpretation on 08/31/2017 at 11:44 am to Janetta Hora, RN,, who verbally acknowledged these results. Electronically Signed   By: David  Martinique M.D.   On: 08/31/2017 11:47   Ct Chest Wo Contrast  Result Date: 09/02/2017 CLINICAL DATA:  [place] from ladder 3 days ago. Multiple right rib fractures with small pneumothorax and pulmonary contusion. Increasing subcutaneous air in pneumothorax on the right side. Fever. EXAM: CT CHEST WITHOUT CONTRAST TECHNIQUE: Multidetector CT imaging of the chest was performed following the standard protocol without IV contrast. COMPARISON:  08/30/2017 FINDINGS: Cardiovascular: Normal heart size. No pericardial effusion. Normal caliber thoracic aorta. Aortic and coronary artery calcifications. Mediastinum/Nodes: Pneumomediastinum extending down from the neck and involving the anterior mediastinum. Scattered lymph nodes in the mediastinum are not pathologically enlarged and likely reactive. Esophagus is decompressed. Lungs/Pleura: Small right pneumothorax is increasing since previous study. There is new collapse or consolidation of the right lung base. Patchy multifocal areas of  airspace and interstitial infiltration in both lungs, demonstrating progression since previous study. This may represent pneumonia or edema. No pleural effusions. There is evidence of layering material within the trachea  and main bronchi. This could represent aspiration. Upper Abdomen: Increased density in the gallbladder likely representing vicarious excretion of previous contrast material. No acute process otherwise demonstrated in the upper abdomen. Musculoskeletal: There is extensive subcutaneous emphysema in the right side of the chest with extension to the left upper chest and neck.Multiple right rib fractures again demonstrated. Mild displacement. IMPRESSION: 1. Increasing right pneumothorax since previous study. 2. Extensive subcutaneous emphysema and pneumomediastinum, also increased since previous studies. 3. Increasing consolidation or volume loss in the right lung with patchy multifocal areas of airspace and interstitial infiltration in both lungs, demonstrating progression. This could represent pneumonia or edema. Layering material within the trachea could indicate aspiration pneumonia. 4. Increased density in the gallbladder likely represents vicarious excretion of previous contrast material. Electronically Signed   By: Lucienne Capers M.D.   On: 09/02/2017 06:30   Dg Chest Port 1 View  Result Date: 09/02/2017 CLINICAL DATA:  Rib fractures EXAM: PORTABLE CHEST 1 VIEW COMPARISON:  09/01/2017 FINDINGS: Significant increase in subcutaneous emphysema since previous study. Right rib fractures are again demonstrated. Tiny right apical pneumothorax. This is more prominent than on yesterday's study. Normal heart size. Increasing perihilar infiltration and interstitial change in the lungs suggesting progressing edema or pneumonia. Aortic atherosclerosis. IMPRESSION: Progression since previous study with tiny but increased right apical pneumothorax and significant increase of subcutaneous emphysema. Increasing infiltration or edema in the lungs. Electronically Signed   By: Lucienne Capers M.D.   On: 09/02/2017 05:16   Dg Chest Port 1 View  Result Date: 09/01/2017 CLINICAL DATA:  Pain following fall EXAM: PORTABLE  CHEST 1 VIEW COMPARISON:  August 31, 2017 chest radiograph; chest CT August 30, 2017 FINDINGS: Subcutaneous air is noted on the right. No evident pneumothorax. Rib fractures are present on the right, better appreciated on recent CT. There is patchy atelectasis in both lung bases with small pleural effusions bilaterally. Heart is upper normal in size with pulmonary vascularity within normal limits. No adenopathy. There is aortic atherosclerosis. IMPRESSION: Subcutaneous air on the right without demonstrable pneumothorax. Small pleural effusions bilaterally. Areas of patchy atelectasis in lung bases, more on the right than on the left. There may be a degree of underlying contusion in the right base. Cardiac silhouette is stable.  There is aortic atherosclerosis. Aortic Atherosclerosis (ICD10-I70.0). Electronically Signed   By: Lowella Grip III M.D.   On: 09/01/2017 07:52   Dg Chest Port 1 View  Result Date: 08/31/2017 CLINICAL DATA:  Respiratory distress. New fever. Diagnosed with small right apical pneumothorax this morning. Multiple right rib fractures. Basilar atelectasis and small pleural effusion. EXAM: PORTABLE CHEST 1 VIEW COMPARISON:  08/31/2017 FINDINGS: Shallow inspiration with linear atelectasis in the lung bases. Cardiac enlargement without vascular congestion. No edema. Fluid or thickened pleura in the right costophrenic angle. The right pneumothorax seen previously is not demonstrated on the current examination. Subcutaneous emphysema in the right lateral chest wall. Right rib fractures. Calcification of the aorta. IMPRESSION: Shallow inspiration with atelectasis in the lung bases. Fluid or thickened pleura in the right costophrenic angle. Pneumothorax seen previously is not well demonstrated, likely due to technique. Subcutaneous emphysema in the right chest wall. Right rib fractures. Aortic atherosclerosis. No significant progression since the previous study. Electronically Signed   By:  Lucienne Capers M.D.   On:  08/31/2017 21:28    Anti-infectives: Anti-infectives (From admission, onward)   Start     Dose/Rate Route Frequency Ordered Stop   09/02/17 0430  vancomycin (VANCOCIN) IVPB 1000 mg/200 mL premix     1,000 mg 200 mL/hr over 60 Minutes Intravenous Every 12 hours 09/02/17 0418     09/02/17 0430  ceFEPIme (MAXIPIME) 1 g in dextrose 5 % 50 mL IVPB     1 g 100 mL/hr over 30 Minutes Intravenous Every 8 hours 09/02/17 0418        Assessment/Plan:   Fall from ladder 10 feet Multiple right rib fractures.- multimodal pain control Right pneumothorax increased size and subcu air- will place chest tube this Am COPD and tobacco abuse.-Continue Spiriva and Dulera;  Duonebs. Worsening respiratory status has required high levels of BIPAP o/n. Transfer to [facility]. CCM consulted. Likely headed toward intubation.   Pulmonary nodules   LOS: 2 days    Clovis Riley 09/02/2017

## 2017-09-02 NOTE — Progress Notes (Signed)
Pharmacy Antibiotic Note  Derrick Sheppard is a 61 y.o. male admitted on 08/30/2017 with fall. Pharmacy initially consulted for vancomycin and cefepime. Now changing cefepime to ceftazidime. Tmax is 103.1 and WBC is WNL. SCr has increased to 1.45.   Plan: Continue Vancomycin 1000 mg IV q12h for now Ceftazidime 2gm IV Q12H F/u renal fxn, C&S, clinical status and trough at SS  Height: 5\' 7"  (170.2 cm) Weight: 169 lb 12.1 oz (77 kg) IBW/kg (Calculated) : 66.1  Temp (24hrs), Avg:99 F (37.2 C), Min:90 F (32.2 C), Max:103.1 F (39.5 C)  Recent Labs  Lab 08/30/17 1835 08/30/17 1858 09/01/17 0313 09/02/17 0415  WBC 16.5*  --  12.8* 5.6  CREATININE 0.98  --  1.14 1.45*  LATICACIDVEN  --  1.15  --   --     Estimated Creatinine Clearance: 48.8 mL/min (A) (by C-G formula based on SCr of 1.45 mg/dL (H)).    Allergies  Allergen Reactions  . Amlodipine Swelling    Makes his ankles and legs swell.  . Codeine Other (See Comments)    Gives him a Headache.     Derrick Sheppard, Derrick Sheppard 09/02/2017 11:23 AM

## 2017-09-02 NOTE — Progress Notes (Signed)
Initial Nutrition Assessment  INTERVENTION:   Pivot 1.5 @ 55 ml/hr  Provides: 1980 kcal, 123 grams protein, and 1001 ml free water.   NUTRITION DIAGNOSIS:   Increased nutrient needs related to wound healing as evidenced by estimated needs.  GOAL:   Patient will meet greater than or equal to 90% of their needs  MONITOR:   I & O's, TF tolerance  REASON FOR ASSESSMENT:   Consult, Ventilator Enteral/tube feeding initiation and management  ASSESSMENT:   Pt with PMH of COPD, CAD, and GERD who was admitted to [facility] 11/20 after fall from 10 ft ladder. He was found to have multiple right sided rib fractures and a small pneumothorax. He was admitted to floor by trauma service for monitoring. He developed hypoxemia was unable to use IS due to pain. He was transferred to SDU for BiPAP. Then developed lethargy. CT scan of the chest demonstrated worsening PTX on 11/23 and chest tube was placed. He was transferred to ICU and intubated upon arrival.   Spoke with RN and family at bedside. No nutrition issues PTA.   Patient is currently intubated on ventilator support MV: 11.9 L/min Temp (24hrs), Avg:98.9 F (37.2 C), Min:90 F (32.2 C), Max:103.1 F (39.5 C)  Medications and lab reviewed    NUTRITION - FOCUSED PHYSICAL EXAM:    Most Recent Value  Orbital Region  No depletion  Upper Arm Region  No depletion  Thoracic and Lumbar Region  No depletion  Buccal Region  Unable to assess  Temple Region  No depletion  Clavicle Bone Region  No depletion  Clavicle and Acromion Bone Region  No depletion  Scapular Bone Region  Unable to assess  Dorsal Hand  No depletion  Patellar Region  No depletion  Anterior Thigh Region  No depletion  Posterior Calf Region  No depletion  Edema (RD Assessment)  None  Hair  Reviewed  Eyes  Unable to assess  Mouth  Unable to assess  Skin  Reviewed  Nails  Reviewed       Diet Order:  Diet NPO time specified Except for: Sips with Meds, Ice  Chips  EDUCATION NEEDS:   No education needs have been identified at this time  Skin:  Skin Assessment: Reviewed RN Assessment  Last BM:  11/20  Height:   Ht Readings from Last 1 Encounters:  09/02/17 5\' 7"  (1.702 m)    Weight:   Wt Readings from Last 1 Encounters:  09/02/17 169 lb 12.1 oz (77 kg)    Ideal Body Weight:  67.2 kg  BMI:  Body mass index is 26.59 kg/m.  Estimated Nutritional Needs:   Kcal:  1886  Protein:  100-120 grams  Fluid:  > 1.8 L/day  Maylon Peppers RD, LDN, CNSC [phone] Pager [phone] After Hours Pager

## 2017-09-02 NOTE — Procedures (Signed)
Intubation Procedure Note Derrick Sheppard [id number] [birth year]  Procedure: Intubation Indications: Respiratory insufficiency  Procedure Details Consent: Unable to obtain consent because of emergent medical necessity. Time Out: Verified patient identification, verified procedure, site/side was marked, verified correct patient position, special equipment/implants available, medications/allergies/relevent history reviewed, required imaging and test results available.  Performed  Maximum sterile technique was used including cap, gloves, gown, hand hygiene and mask.  MAC and 4    Evaluation Hemodynamic Status: BP stable throughout; O2 sats: stable throughout Patient's Current Condition: stable Complications: No apparent complications Patient did tolerate procedure well. Chest X-ray ordered to verify placement.  CXR: pending.   Derrick Sheppard 09/02/2017  [name]  [name]. Titus Mould, MD, [place] Pgr: [place] [facility]

## 2017-09-02 NOTE — Progress Notes (Signed)
Pharmacy Antibiotic Note  Derrick Sheppard is a 61 y.o. male admitted on 08/30/2017 with fall.  Pharmacy has been consulted for Vancomycin/Cefepime dosing for PNA. WBC 12.8. Renal function good. However, pt has been having some respiratory issues and is now febrile up to 103.1.   Plan: Vancomycin 1000 mg IV q12h Cefepime 1g IV q8h Trend WBC, temp, renal function  F/U infectious work-up Drug levels as indicated   Height: 5\' 7"  (170.2 cm) Weight: 164 lb 7.4 oz (74.6 kg) IBW/kg (Calculated) : 66.1  Temp (24hrs), Avg:99.8 F (37.7 C), Min:98 F (36.7 C), Max:103.1 F (39.5 C)  Recent Labs  Lab 08/30/17 1835 08/30/17 1858 09/01/17 0313  WBC 16.5*  --  12.8*  CREATININE 0.98  --  1.14  LATICACIDVEN  --  1.15  --     Estimated Creatinine Clearance: 62 mL/min (by C-G formula based on SCr of 1.14 mg/dL).    Allergies  Allergen Reactions  . Amlodipine Swelling    Makes his ankles and legs swell.  . Codeine Other (See Comments)    Gives him a Headache.     Narda Bonds 09/02/2017 4:14 AM

## 2017-09-02 NOTE — Procedures (Signed)
Central Venous Catheter Insertion Procedure Note Derrick Sheppard [id number] [birth year]  Procedure: Insertion of Central Venous Catheter Indications: Assessment of intravascular volume, Drug and/or fluid administration, Frequent blood sampling and cvp  Procedure Details Consent: Risks of procedure as well as the alternatives and risks of each were explained to the (patient/caregiver).  Consent for procedure obtained. Time Out: Verified patient identification, verified procedure, site/side was marked, verified correct patient position, special equipment/implants available, medications/allergies/relevent history reviewed, required imaging and test results available.  Performed  Maximum sterile technique was used including antiseptics, cap, gloves, gown, hand hygiene, mask and sheet. Skin prep: Chlorhexidine; local anesthetic administered A antimicrobial bonded/coated triple lumen catheter was placed in the right subclavian vein using the Seldinger technique.  Evaluation Blood flow good Complications: No apparent complications Patient did tolerate procedure well. Chest X-ray ordered to verify placement.  CXR: pending.  Derrick Sheppard 09/02/2017, 2:25 PM  Derrick Sheppard. Titus Mould, MD, [place] Pgr: [place] [facility]

## 2017-09-02 NOTE — Procedures (Signed)
Chest Tube Insertion Procedure Note  Indications:  Clinically significant Pneumothorax  Pre-operative Diagnosis: Pneumothorax  Post-operative Diagnosis: Pneumothorax  Procedure Details  Informed consent was obtained verbally for the procedure.  Risks of lung perforation, hemorrhage, arrhythmia, and adverse drug reaction were discussed.   After sterile skin prep, using standard technique, a 14 French tube was placed in the right anterior 4th rib space.  Findings: [place]  Estimated Blood Loss:  Minimal         Specimens:  None              Complications:  None; patient tolerated the procedure well.         Disposition: ICU transfer now         Condition: guarded  Attending Attestation: I performed the procedure.

## 2017-09-02 NOTE — Procedures (Signed)
OGT I placed OGT mouth and direct visualized placement with glide scope into esoph easiy without resistance  Tolerated well Gastric contest back Xray reviewed, advance 5 cm   [name]. Titus Mould, MD, [place] Pgr: [place] [facility]

## 2017-09-03 ENCOUNTER — Inpatient Hospital Stay (HOSPITAL_COMMUNITY): Payer: 59

## 2017-09-03 DIAGNOSIS — S2231XA Fracture of one rib, right side, initial encounter for closed fracture: Secondary | ICD-10-CM

## 2017-09-03 DIAGNOSIS — R911 Solitary pulmonary nodule: Secondary | ICD-10-CM

## 2017-09-03 DIAGNOSIS — R918 Other nonspecific abnormal finding of lung field: Secondary | ICD-10-CM

## 2017-09-03 DIAGNOSIS — W11XXXA Fall on and from ladder, initial encounter: Secondary | ICD-10-CM

## 2017-09-03 LAB — CBC WITH DIFFERENTIAL/PLATELET
BASOS ABS: 0 10*3/uL (ref 0.0–0.1)
Basophils Relative: 0 %
EOS PCT: 4 %
Eosinophils Absolute: 0.4 10*3/uL (ref 0.0–0.7)
HCT: 31.6 % — ABNORMAL LOW (ref 39.0–52.0)
Hemoglobin: 10.7 g/dL — ABNORMAL LOW (ref 13.0–17.0)
LYMPHS PCT: 13 %
Lymphs Abs: 1.3 10*3/uL (ref 0.7–4.0)
MCH: 29.4 pg (ref 26.0–34.0)
MCHC: 33.9 g/dL (ref 30.0–36.0)
MCV: 86.8 fL (ref 78.0–100.0)
MONO ABS: 0.4 10*3/uL (ref 0.1–1.0)
MONOS PCT: 4 %
Neutro Abs: 7.8 10*3/uL — ABNORMAL HIGH (ref 1.7–7.7)
Neutrophils Relative %: 79 %
PLATELETS: 193 10*3/uL (ref 150–400)
RBC: 3.64 MIL/uL — ABNORMAL LOW (ref 4.22–5.81)
RDW: 13.7 % (ref 11.5–15.5)
WBC: 9.9 10*3/uL (ref 4.0–10.5)

## 2017-09-03 LAB — COMPREHENSIVE METABOLIC PANEL
ALBUMIN: 2.2 g/dL — AB (ref 3.5–5.0)
ALK PHOS: 106 U/L (ref 38–126)
ALT: 17 U/L (ref 17–63)
AST: 35 U/L (ref 15–41)
Anion gap: 4 — ABNORMAL LOW (ref 5–15)
BILIRUBIN TOTAL: 1.5 mg/dL — AB (ref 0.3–1.2)
BUN: 25 mg/dL — AB (ref 6–20)
CALCIUM: 8 mg/dL — AB (ref 8.9–10.3)
CO2: 22 mmol/L (ref 22–32)
Chloride: 111 mmol/L (ref 101–111)
Creatinine, Ser: 1.11 mg/dL (ref 0.61–1.24)
GFR calc Af Amer: >60 mL/min (ref >60)
GFR calc non Af Amer: >60 mL/min (ref >60)
GLUCOSE: 145 mg/dL — AB (ref 65–99)
POTASSIUM: 4.2 mmol/L (ref 3.5–5.1)
SODIUM: 137 mmol/L (ref 135–145)
Total Protein: 5.3 g/dL — ABNORMAL LOW (ref 6.5–8.1)

## 2017-09-03 LAB — POCT I-STAT 3, ART BLOOD GAS (G3+)
Acid-base deficit: 7 mmol/L — ABNORMAL HIGH (ref 0.0–2.0)
BICARBONATE: 20.2 mmol/L (ref 20.0–28.0)
O2 Saturation: 70 %
PCO2 ART: 46.2 mmHg (ref 32.0–48.0)
PO2 ART: 42 mmHg — AB (ref 83.0–108.0)
Patient temperature: 98.6
TCO2: 22 mmol/L (ref 22–32)
pH, Arterial: 7.248 — ABNORMAL LOW (ref 7.350–7.450)

## 2017-09-03 LAB — MAGNESIUM
MAGNESIUM: 2.6 mg/dL — AB (ref 1.7–2.4)
Magnesium: 1.6 mg/dL — ABNORMAL LOW (ref 1.7–2.4)

## 2017-09-03 LAB — CBC
HEMATOCRIT: 32.2 % — AB (ref 39.0–52.0)
HEMOGLOBIN: 11.1 g/dL — AB (ref 13.0–17.0)
MCH: 29.8 pg (ref 26.0–34.0)
MCHC: 34.5 g/dL (ref 30.0–36.0)
MCV: 86.6 fL (ref 78.0–100.0)
Platelets: 215 10*3/uL (ref 150–400)
RBC: 3.72 MIL/uL — AB (ref 4.22–5.81)
RDW: 13.3 % (ref 11.5–15.5)
WBC: 14.1 10*3/uL — ABNORMAL HIGH (ref 4.0–10.5)

## 2017-09-03 LAB — GLUCOSE, CAPILLARY
GLUCOSE-CAPILLARY: 125 mg/dL — AB (ref 65–99)
GLUCOSE-CAPILLARY: 128 mg/dL — AB (ref 65–99)
GLUCOSE-CAPILLARY: 144 mg/dL — AB (ref 65–99)
GLUCOSE-CAPILLARY: 145 mg/dL — AB (ref 65–99)
Glucose-Capillary: 139 mg/dL — ABNORMAL HIGH (ref 65–99)
Glucose-Capillary: 161 mg/dL — ABNORMAL HIGH (ref 65–99)
Glucose-Capillary: 139 mg/dL — ABNORMAL HIGH (ref 65–99)

## 2017-09-03 LAB — LACTIC ACID, PLASMA: Lactic Acid, Venous: 1.3 mmol/L (ref 0.5–1.9)

## 2017-09-03 LAB — PHOSPHORUS
Phosphorus: 1.8 mg/dL — ABNORMAL LOW (ref 2.5–4.6)
Phosphorus: 1.9 mg/dL — ABNORMAL LOW (ref 2.5–4.6)

## 2017-09-03 LAB — CORTISOL: Cortisol, Plasma: 8.6 ug/dL

## 2017-09-03 LAB — PROCALCITONIN: Procalcitonin: 45.88 ng/mL

## 2017-09-03 MED ORDER — CHLORHEXIDINE GLUCONATE CLOTH 2 % EX PADS
6.0000 | MEDICATED_PAD | Freq: Every day | CUTANEOUS | Status: DC
  Administered 2017-09-04 – 2017-09-13 (×11): 6 via TOPICAL

## 2017-09-03 MED ORDER — MAGNESIUM SULFATE 4 GM/100ML IV SOLN
4.0000 g | Freq: Once | INTRAVENOUS | Status: AC
  Administered 2017-09-03: 4 g via INTRAVENOUS
  Filled 2017-09-03: qty 100

## 2017-09-03 MED ORDER — CHLORHEXIDINE GLUCONATE 0.12 % MT SOLN
OROMUCOSAL | Status: AC
  Administered 2017-09-03: 15 mL via OROMUCOSAL
  Filled 2017-09-03: qty 15

## 2017-09-03 MED ORDER — POTASSIUM PHOSPHATES 15 MMOLE/5ML IV SOLN
20.0000 mmol | Freq: Once | INTRAVENOUS | Status: AC
  Administered 2017-09-03: 20 mmol via INTRAVENOUS
  Filled 2017-09-03: qty 6.67

## 2017-09-03 MED ORDER — SODIUM CHLORIDE 0.9 % IV BOLUS (SEPSIS)
750.0000 mL | Freq: Once | INTRAVENOUS | Status: AC
  Administered 2017-09-03: 750 mL via INTRAVENOUS

## 2017-09-03 MED ORDER — SODIUM CHLORIDE 0.45 % IV SOLN
INTRAVENOUS | Status: DC
  Administered 2017-09-03 – 2017-09-05 (×3): via INTRAVENOUS

## 2017-09-03 MED ORDER — DEXTROSE 5 % IV SOLN
2.0000 g | Freq: Three times a day (TID) | INTRAVENOUS | Status: DC
  Administered 2017-09-03 – 2017-09-04 (×3): 2 g via INTRAVENOUS
  Filled 2017-09-03 (×4): qty 2

## 2017-09-03 NOTE — Consult Note (Signed)
PULMONARY / CRITICAL CARE MEDICINE   Name: Derrick Sheppard MRN: [id number] DOB: 1955-12-26    ADMISSION DATE:  08/30/2017 CONSULTATION DATE:  09/02/2017  REFERRING MD:  Trauma Dr. Brantley Stage  CHIEF COMPLAINT:  hypoxia  HISTORY OF PRESENT ILLNESS:   61 year old male with PMH as below, which is significant for COPD, CAD, and GERD who was admitted to [facility] 11/20 after fall from 10 ft ladder. He was found to have multiple right sided rib fractures and a small pneumothorax. He was admitted to floor by trauma service for monitoring. He developed hypoxemia was unable to use IS due to pain. He was transferred to SDU for BiPAP. Then developed lethargy. CT scan of the chest demonstrated worsening PTX on 11/23 and chest tube was placed. He was transferred to ICU and intubated upon arrival. PCCM consulted.   SUBJECTIVE:  [name] remains Vent Feeding  VITAL SIGNS: BP 120/68 (BP Location: Left Arm)   Pulse (!) 110   Temp (!) 101.7 F (38.7 C) (Axillary)   Resp (!) 23   Ht 5\' 7"  (1.702 m)   Wt 80.1 kg (176 lb 9.4 oz)   SpO2 91%   BMI 27.66 kg/m   HEMODYNAMICS: CVP:  [10 mmHg-11 mmHg] 10 mmHg  VENTILATOR SETTINGS: Vent Mode: PRVC FiO2 (%):  [40 %-100 %] 50 % Set Rate:  [22 bmp-26 bmp] 26 bmp Vt Set:  [530 mL-550 mL] 530 mL PEEP:  [5 cmH20-8 cmH20] 5 cmH20 Plateau Pressure:  [18 cmH20-23 cmH20] 22 cmH20  INTAKE / OUTPUT: I/O last 3 completed shifts: In: 6619.9 [I.V.:3107.4; NG/GT:907.5; IV XBMWUXLKG:4010] Out: 2725 [Urine:3200; Emesis/NG output:200; Chest Tube:30]  PHYSICAL EXAMINATION: General: rass -1 Neuro: FC, rass -1 HEENT: line left [place] wnl, jvd low PULM: reduced , coarse rt CV:  s1 s2 RRT no r GI: soft, BS wnl, no , sligh tdistention Extremities:  No edema   LABS:  BMET Recent Labs  Lab 09/01/17 0313 09/02/17 0415 09/03/17 0500  NA 134* 134* 137  K 4.0 3.6 4.2  CL 103 104 111  CO2 23 23 22   BUN 10 18 25*  CREATININE 1.14 1.45* 1.11  GLUCOSE 93 109* 145*     Electrolytes Recent Labs  Lab 09/01/17 0313 09/02/17 0415 09/02/17 1119 09/02/17 1558 09/03/17 0500  CALCIUM 8.4* 8.6*  --   --  8.0*  MG  --   --  1.3* 1.4* 1.6*  PHOS  --   --  2.8 2.5 1.8*    CBC Recent Labs  Lab 09/02/17 0415 09/03/17 0000 09/03/17 0500  WBC 5.6 14.1* 9.9  HGB 12.6* 11.1* 10.7*  HCT 36.6* 32.2* 31.6*  PLT 142* 215 193    Coag's No results for input(s): APTT, INR in the last 168 hours.  Sepsis Markers Recent Labs  Lab 08/30/17 1858 09/02/17 1119 09/03/17 0500  LATICACIDVEN 1.15 3.2*  --   PROCALCITON  --  58.37 45.88    ABG Recent Labs  Lab 09/02/17 0445 09/02/17 1019 09/02/17 1227  PHART 7.296* 7.228* 7.252*  PCO2ART 47.5 47.4 41.2  PO2ART 118* 211.0* 91.0    Liver Enzymes Recent Labs  Lab 08/30/17 1835 09/03/17 0500  AST 23 35  ALT 15* 17  ALKPHOS 159* 106  BILITOT 0.8 1.5*  ALBUMIN 3.5 2.2*    Cardiac Enzymes No results for input(s): TROPONINI, PROBNP in the last 168 hours.  Glucose Recent Labs  Lab 09/02/17 1132 09/02/17 1635 09/02/17 2021 09/03/17 0022 09/03/17 0330 09/03/17 0810  GLUCAP  104* 101* 109* 125* 128* 139*    Imaging Dg Chest Port 1 View  Result Date: 09/03/2017 CLINICAL DATA:  Pneumonia. EXAM: PORTABLE CHEST 1 VIEW COMPARISON:  09/02/2017 FINDINGS: Endotracheal tube terminates at the inferior margin of the clavicular heads, well above the carina. Right subclavian catheter terminates over the lower SVC. Right-sided chest tube is unchanged. Enteric tube courses into the stomach with tip not imaged. The cardiomediastinal silhouette is unchanged. Aortic atherosclerosis is noted. Mid and lower lung airspace opacities, right greater than left, are unchanged. No sizable pleural effusion or definite pneumothorax is identified, with evaluation mildly limited by extensive soft tissue emphysema throughout the right chest wall and neck. IMPRESSION: Unchanged examination including right greater than left  basilar opacities which may reflect pneumonia, contusion, and/or edema. No pneumothorax. Electronically Signed   By: Logan Bores M.D.   On: 09/03/2017 08:06   Dg Chest Port 1 View  Result Date: 09/02/2017 CLINICAL DATA:  Post right CVL placement. EXAM: PORTABLE CHEST 1 VIEW COMPARISON:  Chest x-ray from earlier same day. FINDINGS: The newly placed right-sided central line appears well positioned at the level of the lower SVC. Endotracheal tube is stable in position with tip approximately 4 cm above the carina. Enteric tube passes below the diaphragm. Right-sided chest tube is stable in position with tip near the right hilum. Atherosclerotic changes noted at the aortic arch. Subcutaneous emphysema again noted along the right lateral chest wall and within the right lower neck. Stable bibasilar opacities, right greater than left, with differential considerations of edema, contusion and aspiration. No new lung findings. No pneumothorax seen although characterization is limited by the overlying subcutaneous emphysema. IMPRESSION: 1. Right-sided central line appears well positioned with tip at the level of the lower SVC. 2. Otherwise stable chest x-ray, as detailed above. 3. Aortic atherosclerosis. Electronically Signed   By: Franki Cabot M.D.   On: 09/02/2017 14:12   Dg Chest Port 1v Same Day  Result Date: 09/02/2017 CLINICAL DATA:  Chest tube placement. EXAM: PORTABLE CHEST 1 VIEW COMPARISON:  CT chest an one-view chest x-ray from earlier the same day. FINDINGS: The right lung is re-expanded following placement of a pigtail catheter chest tube. Right upper lobe airspace consolidation is again seen. Diffuse interstitial edema is present. Heart size is normal. The endotracheal tube terminates 5 cm above the carina, in satisfactory position. OG or NG tube is in place. The side port is above the GE junction and could be advanced 5-10 cm for more optimal positioning. Extensive right-sided subcutaneous emphysema  is again noted. IMPRESSION: 1. Re-expansion of the right lung following placement of chest tube. 2. Diffuse interstitial edema and asymmetric right upper lobe airspace disease. While this may represent atelectasis, infection or aspiration is also considered. 3. Endotracheal tube in satisfactory position, 5 cm above the carina. 4. The side port of the NG tube is above the GE junction and could be advanced 5-10 cm for more optimal positioning. Electronically Signed   By: San Morelle M.D.   On: 09/02/2017 09:45   Dg Abd Portable 1v  Result Date: 09/02/2017 CLINICAL DATA:  OG tube placement. EXAM: PORTABLE ABDOMEN - 1 VIEW COMPARISON:  CT chest 09/02/2017 FINDINGS: Mildly dilated loops of small bowel are present centrally within the abdomen. Subcutaneous emphysema extends over the right side of the body. There is no free air. The side port above NG or OG tube is just above the GE junction and could be advanced for more optimal positioning. IMPRESSION: 1.  Side port of the OG tube is just above the GE junction and could be advanced for more optimal positioning. 2. Mild dilation bowel in the central abdomen may represent focal ileus. No definite obstruction or free air is present. 3. Subcutaneous emphysema extends over the right side of the body. Electronically Signed   By: San Morelle M.D.   On: 09/02/2017 09:42     STUDIES:  CT head/Cspine 11/20 > Atrophy with minimal small vessel chronic ischemic changes of deep cerebral white matter. No acute intracranial abnormalities. Congenital fusion C4-C5. No acute cervical spine abnormalities. CT chest/abd/pelv 11/20 > Fractures of the RIGHT eighth ninth tenth and eleventh ribs. Dependent atelectasis in BILATERAL lower lobes. Tiny foci of RIGHT pneumothorax. No acute intra-abdominal or intrapelvic abnormalities. Prostatic enlargement. Small BILATERAL inguinal hernias. 3 mm nonspecific RIGHT lung nodule with an additional slightly irregular 8 mm  diameter nodular density in the RIGHT upper lobe question nodule versus scarring ; recommendation below.  CT chest 11/23 > Increasing right pneumothorax since previous study. Extensive subcutaneous emphysema and pneumomediastinum, also increased since previous studies. Increasing consolidation or volume loss in the right lung with patchy multifocal areas of airspace and interstitial infiltration in both lungs, demonstrating progression. This could represent pneumonia or edema. Layering material within the trachea could indicate aspiration pneumonia. ncreased density in the gallbladder likely represents vicarious excretion of previous contrast material.  CULTURES: Tracheal asp 11/23 > Blood 11/23 >  ANTIBIOTICS: Cefepime 11/23 >11/23 ceftaz 11/23>>> Vanco 11/23 >  SIGNIFICANT EVENTS: 11/20 admit for fall and PTX 11/23 hypoxia, PTX worse. CT placed. Transfer to ICU intubated 11/23 sepsis, shock  LINES/TUBES: ETT 11/23 > Rt Chest tube 11/23 > Rt  line 11/23>>>  DISCUSSION: 61 year old male admitted with rib fx and PTX after fall 11/23. Developed hypoxia and lethargy and was transferred to ICU 11/23 for intubation. CT placed 11/23 as well after PTX failed to improve.   ASSESSMENT / PLAN:  PULMONARY A: Acute hypoxemic respiratory failure: ATX 2/2 rib, Aspiration PNA, contusion COPD Right sided PTX Pulmonary nodules (stable on CT 11/23)  P:   Keep same elevated MV, repeat abg this am  NO SBT with MV over 15 liters pcxr repeat in am  consider water seal in am   CARDIOVASCULAR A:  Hypotension, likely sedation related.  Hx HTN Septi shock, SIRS  P:  [name] to map 65 cvp 10, bolus Lactic repeat Cortisol repeat  RENAL A:   AKI ATN hypophos hypomag P:   supp mag, phos Lytes in am  cvp 10, bolus CL rise, use 1/2 NS at [facility]  GASTROINTESTINAL A:   GERD  P:   Feeding to goal, pivot as trauma Protonix IV  HEMATOLOGIC A:   Mild anemia DV Tp  revention P:  Follow HGB in am  lov  INFECTIOUS A:   Aspiration PNA vs HCAP (hosp day 3)  P:   Ceftaz, vanc In am will narrow if neg cultures remain Trend PCT - is down  ENDOCRINE A:   Rule out rel AI    P:   Cortisol repeat cbg  NEUROLOGIC A:   Acute metabolic encephalopathy - improved  P:   RASS goal: -1 Fentanyl infusion, versed PRN Daily WUA  FAMILY  - Updates: family updated bedside 11/23 by DF  - Inter-disciplinary family meet or Palliative Care meeting due by:  11/27  Ccm time 35 min   [name]. Titus Mould, MD, [place] Pgr: [facility] 09/03/2017 8:58  AM     

## 2017-09-03 NOTE — Progress Notes (Signed)
Subjective/Chief Complaint: Pt intubated yesterday.  CCM managing CT placed for PTX   Objective: Vital signs in last 24 hours: Temp:  [98 F (36.7 C)-101.7 F (38.7 C)] 101.7 F (38.7 C) (11/24 0800) Pulse Rate:  [92-124] 110 (11/24 0800) Resp:  [12-29] 23 (11/24 0800) BP: (82-128)/(43-77) 120/68 (11/24 0800) SpO2:  [88 %-95 %] 91 % (11/24 0834) FiO2 (%):  [40 %-80 %] 80 % (11/24 0935) Weight:  [80.1 kg (176 lb 9.4 oz)] 80.1 kg (176 lb 9.4 oz) (11/24 0500) Last BM Date: 08/30/17  Intake/Output from previous day: 11/23 0701 - 11/24 0700 In: 6450.7 [I.V.:2993.2; NG/GT:907.5; IV Piggyback:2550] Out: 2778 [Urine:2950; Emesis/NG output:200; Chest Tube:30] Intake/Output this shift: Total I/O In: 173.8 [I.V.:118.8; NG/GT:55] Out: -   Constitutional: No acute distress, sedated. Eyes: Anicteric sclerae, moist conjunctiva, no lid lag Lungs: coarse bilaterally, intubated CV: regular rate and rhythm, no murmurs, no peripheral edema, pedal pulses 2+ GI: Soft, no masses or hepatosplenomegaly, non-tender to palpation Skin: No rashes, palpation reveals normal turgor Psychiatric: sedated   Lab Results:  Recent Labs    09/03/17 0000 09/03/17 0500  WBC 14.1* 9.9  HGB 11.1* 10.7*  HCT 32.2* 31.6*  PLT 215 193   BMET Recent Labs    09/02/17 0415 09/03/17 0500  NA 134* 137  K 3.6 4.2  CL 104 111  CO2 23 22  GLUCOSE 109* 145*  BUN 18 25*  CREATININE 1.45* 1.11  CALCIUM 8.6* 8.0*   PT/INR No results for input(s): LABPROT, INR in the last 72 hours. ABG Recent Labs    09/02/17 1227 09/03/17 0932  PHART 7.252* 7.248*  HCO3 18.1* 20.2    Studies/Results: Ct Chest Wo Contrast  Result Date: 09/02/2017 CLINICAL DATA:  [place] from ladder 3 days ago. Multiple right rib fractures with small pneumothorax and pulmonary contusion. Increasing subcutaneous air in pneumothorax on the right side. Fever. EXAM: CT CHEST WITHOUT CONTRAST TECHNIQUE: Multidetector CT imaging of the  chest was performed following the standard protocol without IV contrast. COMPARISON:  08/30/2017 FINDINGS: Cardiovascular: Normal heart size. No pericardial effusion. Normal caliber thoracic aorta. Aortic and coronary artery calcifications. Mediastinum/Nodes: Pneumomediastinum extending down from the neck and involving the anterior mediastinum. Scattered lymph nodes in the mediastinum are not pathologically enlarged and likely reactive. Esophagus is decompressed. Lungs/Pleura: Small right pneumothorax is increasing since previous study. There is new collapse or consolidation of the right lung base. Patchy multifocal areas of airspace and interstitial infiltration in both lungs, demonstrating progression since previous study. This may represent pneumonia or edema. No pleural effusions. There is evidence of layering material within the trachea and main bronchi. This could represent aspiration. Upper Abdomen: Increased density in the gallbladder likely representing vicarious excretion of previous contrast material. No acute process otherwise demonstrated in the upper abdomen. Musculoskeletal: There is extensive subcutaneous emphysema in the right side of the chest with extension to the left upper chest and neck.Multiple right rib fractures again demonstrated. Mild displacement. IMPRESSION: 1. Increasing right pneumothorax since previous study. 2. Extensive subcutaneous emphysema and pneumomediastinum, also increased since previous studies. 3. Increasing consolidation or volume loss in the right lung with patchy multifocal areas of airspace and interstitial infiltration in both lungs, demonstrating progression. This could represent pneumonia or edema. Layering material within the trachea could indicate aspiration pneumonia. 4. Increased density in the gallbladder likely represents vicarious excretion of previous contrast material. Electronically Signed   By: Lucienne Capers M.D.   On: 09/02/2017 06:30   Dg Chest [facility]  1 View  Result Date: 09/03/2017 CLINICAL DATA:  Pneumonia. EXAM: PORTABLE CHEST 1 VIEW COMPARISON:  09/02/2017 FINDINGS: Endotracheal tube terminates at the inferior margin of the clavicular heads, well above the carina. Right subclavian catheter terminates over the lower SVC. Right-sided chest tube is unchanged. Enteric tube courses into the stomach with tip not imaged. The cardiomediastinal silhouette is unchanged. Aortic atherosclerosis is noted. Mid and lower lung airspace opacities, right greater than left, are unchanged. No sizable pleural effusion or definite pneumothorax is identified, with evaluation mildly limited by extensive soft tissue emphysema throughout the right chest wall and neck. IMPRESSION: Unchanged examination including right greater than left basilar opacities which may reflect pneumonia, contusion, and/or edema. No pneumothorax. Electronically Signed   By: Logan Bores M.D.   On: 09/03/2017 08:06   Dg Chest Port 1 View  Result Date: 09/02/2017 CLINICAL DATA:  Post right CVL placement. EXAM: PORTABLE CHEST 1 VIEW COMPARISON:  Chest x-ray from earlier same day. FINDINGS: The newly placed right-sided central line appears well positioned at the level of the lower SVC. Endotracheal tube is stable in position with tip approximately 4 cm above the carina. Enteric tube passes below the diaphragm. Right-sided chest tube is stable in position with tip near the right hilum. Atherosclerotic changes noted at the aortic arch. Subcutaneous emphysema again noted along the right lateral chest wall and within the right lower neck. Stable bibasilar opacities, right greater than left, with differential considerations of edema, contusion and aspiration. No new lung findings. No pneumothorax seen although characterization is limited by the overlying subcutaneous emphysema. IMPRESSION: 1. Right-sided central line appears well positioned with tip at the level of the lower SVC. 2. Otherwise stable chest  x-ray, as detailed above. 3. Aortic atherosclerosis. Electronically Signed   By: Franki Cabot M.D.   On: 09/02/2017 14:12   Dg Chest Port 1 View  Result Date: 09/02/2017 CLINICAL DATA:  Rib fractures EXAM: PORTABLE CHEST 1 VIEW COMPARISON:  09/01/2017 FINDINGS: Significant increase in subcutaneous emphysema since previous study. Right rib fractures are again demonstrated. Tiny right apical pneumothorax. This is more prominent than on yesterday's study. Normal heart size. Increasing perihilar infiltration and interstitial change in the lungs suggesting progressing edema or pneumonia. Aortic atherosclerosis. IMPRESSION: Progression since previous study with tiny but increased right apical pneumothorax and significant increase of subcutaneous emphysema. Increasing infiltration or edema in the lungs. Electronically Signed   By: Lucienne Capers M.D.   On: 09/02/2017 05:16   Dg Chest Port 1v Same Day  Result Date: 09/02/2017 CLINICAL DATA:  Chest tube placement. EXAM: PORTABLE CHEST 1 VIEW COMPARISON:  CT chest an one-view chest x-ray from earlier the same day. FINDINGS: The right lung is re-expanded following placement of a pigtail catheter chest tube. Right upper lobe airspace consolidation is again seen. Diffuse interstitial edema is present. Heart size is normal. The endotracheal tube terminates 5 cm above the carina, in satisfactory position. OG or NG tube is in place. The side port is above the GE junction and could be advanced 5-10 cm for more optimal positioning. Extensive right-sided subcutaneous emphysema is again noted. IMPRESSION: 1. Re-expansion of the right lung following placement of chest tube. 2. Diffuse interstitial edema and asymmetric right upper lobe airspace disease. While this may represent atelectasis, infection or aspiration is also considered. 3. Endotracheal tube in satisfactory position, 5 cm above the carina. 4. The side port of the NG tube is above the GE junction and could be  advanced 5-10 cm for  more optimal positioning. Electronically Signed   By: San Morelle M.D.   On: 09/02/2017 09:45   Dg Abd Portable 1v  Result Date: 09/02/2017 CLINICAL DATA:  OG tube placement. EXAM: PORTABLE ABDOMEN - 1 VIEW COMPARISON:  CT chest 09/02/2017 FINDINGS: Mildly dilated loops of small bowel are present centrally within the abdomen. Subcutaneous emphysema extends over the right side of the body. There is no free air. The side port above NG or OG tube is just above the GE junction and could be advanced for more optimal positioning. IMPRESSION: 1. Side port of the OG tube is just above the GE junction and could be advanced for more optimal positioning. 2. Mild dilation bowel in the central abdomen may represent focal ileus. No definite obstruction or free air is present. 3. Subcutaneous emphysema extends over the right side of the body. Electronically Signed   By: San Morelle M.D.   On: 09/02/2017 09:42    Anti-infectives: Anti-infectives (From admission, onward)   Start     Dose/Rate Route Frequency Ordered Stop   09/02/17 1400  cefTAZidime (FORTAZ) 2 g in dextrose 5 % 50 mL IVPB     2 g 100 mL/hr over 30 Minutes Intravenous Every 12 hours 09/02/17 1122     09/02/17 0430  vancomycin (VANCOCIN) IVPB 1000 mg/200 mL premix     1,000 mg 200 mL/hr over 60 Minutes Intravenous Every 12 hours 09/02/17 0418     09/02/17 0430  ceFEPIme (MAXIPIME) 1 g in dextrose 5 % 50 mL IVPB  Status:  Discontinued     1 g 100 mL/hr over 30 Minutes Intravenous Every 8 hours 09/02/17 0418 09/02/17 1111      Assessment/Plan: Fall from ladder 10 feet Multiple right rib fractures.- multimodal pain control Right pneumothorax- Con't CT to suction, OK for water tomorrow VDRF- Appreciate CCM help with vent, will wean as tol COPD and tobacco abuse.-Continue Spiriva and Dulera;  Duonebs.  FEN - Con't TFs at goal    LOS: 3 days    Rosario Jacks., [facility] 09/03/2017

## 2017-09-03 NOTE — Progress Notes (Signed)
Pharmacy Antibiotic Note  Derrick Sheppard is a 61 y.o. male admitted on 08/30/2017 with fall. Pharmacy consulted for vancomycin and ceftazidime dosing. Cefepime was switched to ceftazidime yesterday. WBC count improved to 9.9 and Tmax - 101.7. SCr has improved to 1.11 with CrCl ~ 65 ml/min. PCT has trended down. Will adjust antibiotics for improved renal function.  Plan Continue Vancomycin 1000 mg IV q12h  Adjust Ceftazidime to 2gm IV Q8H F/u renal fxn, C&S, clinical status and trough at SS  Height: 5\' 7"  (170.2 cm) Weight: 176 lb 9.4 oz (80.1 kg) IBW/kg (Calculated) : 66.1  Temp (24hrs), Avg:100.2 F (37.9 C), Min:98.9 F (37.2 C), Max:101.7 F (38.7 C)  Recent Labs  Lab 08/30/17 1835 08/30/17 1858 09/01/17 0313 09/02/17 0415 09/02/17 1119 09/03/17 0000 09/03/17 0500  WBC 16.5*  --  12.8* 5.6  --  14.1* 9.9  CREATININE 0.98  --  1.14 1.45*  --   --  1.11  LATICACIDVEN  --  1.15  --   --  3.2*  --   --     Estimated Creatinine Clearance: 69.1 mL/min (by C-G formula based on SCr of 1.11 mg/dL).    Allergies  Allergen Reactions  . Amlodipine Swelling    Makes his ankles and legs swell.  . Codeine Other (See Comments)    Gives him a Headache.   Vanc 11/23>> Cefepime 11/23>>11/23 Ceftaz 11/23>>  11/23 Blood: In process  11/23 TA: Gm positive cocci in chains and pairs  11/23 MRSA PCR: Negative   Susa Raring, PharmD, BCPS PGY2 Infectious Diseases Pharmacy Resident Phone: Rhunette Croft [phone] 09/03/2017 1:02 PM

## 2017-09-04 ENCOUNTER — Inpatient Hospital Stay (HOSPITAL_COMMUNITY): Payer: 59

## 2017-09-04 LAB — COMPREHENSIVE METABOLIC PANEL WITH GFR
ALT: 20 U/L (ref 17–63)
AST: 41 U/L (ref 15–41)
Albumin: 2 g/dL — ABNORMAL LOW (ref 3.5–5.0)
Alkaline Phosphatase: 189 U/L — ABNORMAL HIGH (ref 38–126)
Anion gap: 3 — ABNORMAL LOW (ref 5–15)
BUN: 22 mg/dL — ABNORMAL HIGH (ref 6–20)
CO2: 24 mmol/L (ref 22–32)
Calcium: 8.1 mg/dL — ABNORMAL LOW (ref 8.9–10.3)
Chloride: 112 mmol/L — ABNORMAL HIGH (ref 101–111)
Creatinine, Ser: 0.91 mg/dL (ref 0.61–1.24)
GFR calc Af Amer: >60 mL/min
GFR calc non Af Amer: >60 mL/min
Glucose, Bld: 124 mg/dL — ABNORMAL HIGH (ref 65–99)
Potassium: 4.3 mmol/L (ref 3.5–5.1)
Sodium: 139 mmol/L (ref 135–145)
Total Bilirubin: 1.2 mg/dL (ref 0.3–1.2)
Total Protein: 5.2 g/dL — ABNORMAL LOW (ref 6.5–8.1)

## 2017-09-04 LAB — CBC WITH DIFFERENTIAL/PLATELET
BASOS ABS: 0 10*3/uL (ref 0.0–0.1)
Basophils Relative: 0 %
EOS ABS: 0.6 10*3/uL (ref 0.0–0.7)
EOS PCT: 5 %
HCT: 30.5 % — ABNORMAL LOW (ref 39.0–52.0)
Hemoglobin: 10.2 g/dL — ABNORMAL LOW (ref 13.0–17.0)
LYMPHS ABS: 2 10*3/uL (ref 0.7–4.0)
Lymphocytes Relative: 17 %
MCH: 29.2 pg (ref 26.0–34.0)
MCHC: 33.4 g/dL (ref 30.0–36.0)
MCV: 87.4 fL (ref 78.0–100.0)
Monocytes Absolute: 0.6 10*3/uL (ref 0.1–1.0)
Monocytes Relative: 6 %
Neutro Abs: 8.5 10*3/uL — ABNORMAL HIGH (ref 1.7–7.7)
Neutrophils Relative %: 72 %
PLATELETS: 206 10*3/uL (ref 150–400)
RBC: 3.49 MIL/uL — AB (ref 4.22–5.81)
RDW: 14.2 % (ref 11.5–15.5)
WBC: 11.7 10*3/uL — AB (ref 4.0–10.5)

## 2017-09-04 LAB — POCT I-STAT 3, ART BLOOD GAS (G3+)
ACID-BASE DEFICIT: 3 mmol/L — AB (ref 0.0–2.0)
Bicarbonate: 23.7 mmol/L (ref 20.0–28.0)
O2 Saturation: 94 %
PH ART: 7.267 — AB (ref 7.350–7.450)
Patient temperature: 102.4
TCO2: 25 mmol/L (ref 22–32)
pCO2 arterial: 53.3 mmHg — ABNORMAL HIGH (ref 32.0–48.0)
pO2, Arterial: 92 mmHg (ref 83.0–108.0)

## 2017-09-04 LAB — GLUCOSE, CAPILLARY
GLUCOSE-CAPILLARY: 134 mg/dL — AB (ref 65–99)
Glucose-Capillary: 128 mg/dL — ABNORMAL HIGH (ref 65–99)
Glucose-Capillary: 123 mg/dL — ABNORMAL HIGH (ref 65–99)
Glucose-Capillary: 128 mg/dL — ABNORMAL HIGH (ref 65–99)
Glucose-Capillary: 136 mg/dL — ABNORMAL HIGH (ref 65–99)
Glucose-Capillary: 130 mg/dL — ABNORMAL HIGH (ref 65–99)

## 2017-09-04 LAB — PROCALCITONIN: Procalcitonin: 30.49 ng/mL

## 2017-09-04 MED ORDER — CHLORHEXIDINE GLUCONATE 0.12 % MT SOLN
OROMUCOSAL | Status: AC
  Administered 2017-09-04: 15 mL via OROMUCOSAL
  Filled 2017-09-04: qty 15

## 2017-09-04 MED ORDER — DOCUSATE SODIUM 50 MG/5ML PO LIQD
100.0000 mg | Freq: Two times a day (BID) | ORAL | Status: DC
  Administered 2017-09-04 – 2017-09-16 (×14): 100 mg via ORAL
  Filled 2017-09-04 (×18): qty 10

## 2017-09-04 MED ORDER — CEFTRIAXONE SODIUM 1 G IJ SOLR
1.0000 g | INTRAMUSCULAR | Status: DC
  Filled 2017-09-04: qty 10

## 2017-09-04 MED ORDER — POLYETHYLENE GLYCOL 3350 17 G PO PACK
17.0000 g | PACK | Freq: Every day | ORAL | Status: DC
  Administered 2017-09-04 – 2017-09-16 (×7): 17 g via ORAL
  Filled 2017-09-04 (×9): qty 1

## 2017-09-04 MED ORDER — DEXTROSE 5 % IV SOLN
2.0000 g | INTRAVENOUS | Status: AC
  Administered 2017-09-04 – 2017-09-13 (×10): 2 g via INTRAVENOUS
  Filled 2017-09-04 (×11): qty 2

## 2017-09-04 NOTE — Progress Notes (Signed)
Subjective/Chief Complaint: No acute events. O2 and neo weans significantly overnight.    Objective: Vital signs in last 24 hours: Temp:  [99.4 F (37.4 C)-102.4 F (39.1 C)] 100.2 F (37.9 C) (11/25 0830) Pulse Rate:  [95-127] 118 (11/25 0830) Resp:  [11-27] 21 (11/25 0830) BP: (91-148)/(38-71) 114/63 (11/25 0830) SpO2:  [85 %-97 %] 94 % (11/25 0830) FiO2 (%):  [50 %-80 %] 50 % (11/25 0809) Weight:  [80.4 kg (177 lb 4 oz)] 80.4 kg (177 lb 4 oz) (11/25 0350) Last BM Date: 08/30/17  Intake/Output from previous day: 11/24 0701 - 11/25 0700 In: 4768.8 [I.V.:2492.2; NG/GT:1320; IV Piggyback:956.7] Out: 2002 [Urine:1900; Chest Tube:102] Intake/Output this shift: Total I/O In: 124.7 [I.V.:106.4; NG/GT:18.3] Out: -   Constitutional: No acute distress, sedated. Eyes: Anicteric sclerae, moist conjunctiva, no lid lag Lungs: coarse bilaterally, intubated. Subcu emphysema much improved. Chest tube output serous.  CV: regular rate and rhythm, no murmurs, no peripheral edema, pedal pulses 2+ GI: Soft, no masses or hepatosplenomegaly, non-tender to palpation Skin: No rashes, palpation reveals normal turgor Psychiatric: sedated   Lab Results:  Recent Labs    09/03/17 0500 09/04/17 0344  WBC 9.9 11.7*  HGB 10.7* 10.2*  HCT 31.6* 30.5*  PLT 193 206   BMET Recent Labs    09/03/17 0500 09/04/17 0344  NA 137 139  K 4.2 4.3  CL 111 112*  CO2 22 24  GLUCOSE 145* 124*  BUN 25* 22*  CREATININE 1.11 0.91  CALCIUM 8.0* 8.1*   PT/INR No results for input(s): LABPROT, INR in the last 72 hours. ABG Recent Labs    09/03/17 0932 09/04/17 0311  PHART 7.248* 7.267*  HCO3 20.2 23.7    Studies/Results: Dg Chest Port 1 View  Result Date: 09/04/2017 CLINICAL DATA:  Pneumonia. EXAM: PORTABLE CHEST 1 VIEW COMPARISON:  09/03/2017 FINDINGS: Endotracheal tube terminates just below the clavicular heads, well above the carina. Enteric tube courses into the stomach with tip not  imaged. Right subclavian catheter and right pigtail pleural catheter are unchanged. The cardiomediastinal silhouette is unchanged. Right chest wall subcutaneous emphysema has slightly decreased. Bilateral lung opacities, greatest in the right mid lung and both lung bases, are unchanged. No sizable pleural effusion or definite pneumothorax is identified. IMPRESSION: 1. Slightly decreased subcutaneous emphysema in the right chest wall. No definite pneumothorax. 2. Unchanged bilateral airspace opacities. Electronically Signed   By: Logan Bores M.D.   On: 09/04/2017 07:45   Dg Chest Port 1 View  Result Date: 09/03/2017 CLINICAL DATA:  Pneumonia. EXAM: PORTABLE CHEST 1 VIEW COMPARISON:  09/02/2017 FINDINGS: Endotracheal tube terminates at the inferior margin of the clavicular heads, well above the carina. Right subclavian catheter terminates over the lower SVC. Right-sided chest tube is unchanged. Enteric tube courses into the stomach with tip not imaged. The cardiomediastinal silhouette is unchanged. Aortic atherosclerosis is noted. Mid and lower lung airspace opacities, right greater than left, are unchanged. No sizable pleural effusion or definite pneumothorax is identified, with evaluation mildly limited by extensive soft tissue emphysema throughout the right chest wall and neck. IMPRESSION: Unchanged examination including right greater than left basilar opacities which may reflect pneumonia, contusion, and/or edema. No pneumothorax. Electronically Signed   By: Logan Bores M.D.   On: 09/03/2017 08:06   Dg Chest Port 1 View  Result Date: 09/02/2017 CLINICAL DATA:  Post right CVL placement. EXAM: PORTABLE CHEST 1 VIEW COMPARISON:  Chest x-ray from earlier same day. FINDINGS: The newly placed right-sided central line appears  well positioned at the level of the lower SVC. Endotracheal tube is stable in position with tip approximately 4 cm above the carina. Enteric tube passes below the diaphragm. Right-sided  chest tube is stable in position with tip near the right hilum. Atherosclerotic changes noted at the aortic arch. Subcutaneous emphysema again noted along the right lateral chest wall and within the right lower neck. Stable bibasilar opacities, right greater than left, with differential considerations of edema, contusion and aspiration. No new lung findings. No pneumothorax seen although characterization is limited by the overlying subcutaneous emphysema. IMPRESSION: 1. Right-sided central line appears well positioned with tip at the level of the lower SVC. 2. Otherwise stable chest x-ray, as detailed above. 3. Aortic atherosclerosis. Electronically Signed   By: Franki Cabot M.D.   On: 09/02/2017 14:12   Dg Chest Port 1v Same Day  Result Date: 09/02/2017 CLINICAL DATA:  Chest tube placement. EXAM: PORTABLE CHEST 1 VIEW COMPARISON:  CT chest an one-view chest x-ray from earlier the same day. FINDINGS: The right lung is re-expanded following placement of a pigtail catheter chest tube. Right upper lobe airspace consolidation is again seen. Diffuse interstitial edema is present. Heart size is normal. The endotracheal tube terminates 5 cm above the carina, in satisfactory position. OG or NG tube is in place. The side port is above the GE junction and could be advanced 5-10 cm for more optimal positioning. Extensive right-sided subcutaneous emphysema is again noted. IMPRESSION: 1. Re-expansion of the right lung following placement of chest tube. 2. Diffuse interstitial edema and asymmetric right upper lobe airspace disease. While this may represent atelectasis, infection or aspiration is also considered. 3. Endotracheal tube in satisfactory position, 5 cm above the carina. 4. The side port of the NG tube is above the GE junction and could be advanced 5-10 cm for more optimal positioning. Electronically Signed   By: San Morelle M.D.   On: 09/02/2017 09:45   Dg Abd Portable 1v  Result Date:  09/02/2017 CLINICAL DATA:  OG tube placement. EXAM: PORTABLE ABDOMEN - 1 VIEW COMPARISON:  CT chest 09/02/2017 FINDINGS: Mildly dilated loops of small bowel are present centrally within the abdomen. Subcutaneous emphysema extends over the right side of the body. There is no free air. The side port above NG or OG tube is just above the GE junction and could be advanced for more optimal positioning. IMPRESSION: 1. Side port of the OG tube is just above the GE junction and could be advanced for more optimal positioning. 2. Mild dilation bowel in the central abdomen may represent focal ileus. No definite obstruction or free air is present. 3. Subcutaneous emphysema extends over the right side of the body. Electronically Signed   By: San Morelle M.D.   On: 09/02/2017 09:42    Anti-infectives: Anti-infectives (From admission, onward)   Start     Dose/Rate Route Frequency Ordered Stop   09/04/17 1400  cefTRIAXone (ROCEPHIN) 2 g in dextrose 5 % 50 mL IVPB     2 g 100 mL/hr over 30 Minutes Intravenous Every 24 hours 09/04/17 0857     09/04/17 0900  cefTRIAXone (ROCEPHIN) 1 g in dextrose 5 % 50 mL IVPB  Status:  Discontinued     1 g 100 mL/hr over 30 Minutes Intravenous Every 24 hours 09/04/17 0851 09/04/17 0857   09/03/17 1400  cefTAZidime (FORTAZ) 2 g in dextrose 5 % 50 mL IVPB  Status:  Discontinued     2 g 100 mL/hr over  30 Minutes Intravenous Every 8 hours 09/03/17 1309 09/04/17 0851   09/02/17 1400  cefTAZidime (FORTAZ) 2 g in dextrose 5 % 50 mL IVPB  Status:  Discontinued     2 g 100 mL/hr over 30 Minutes Intravenous Every 12 hours 09/02/17 1122 09/03/17 1309   09/02/17 0430  vancomycin (VANCOCIN) IVPB 1000 mg/200 mL premix  Status:  Discontinued     1,000 mg 200 mL/hr over 60 Minutes Intravenous Every 12 hours 09/02/17 0418 09/04/17 0851   09/02/17 0430  ceFEPIme (MAXIPIME) 1 g in dextrose 5 % 50 mL IVPB  Status:  Discontinued     1 g 100 mL/hr over 30 Minutes Intravenous Every 8 hours  09/02/17 0418 09/02/17 1111      Assessment/Plan: Fall from ladder 10 feet Multiple right rib fractures.- multimodal pain control Right pneumothorax- Will put chest tube to water seal.  VDRF- Appreciate CCM help with vent, will wean as tol COPD and tobacco abuse.-Continue Spiriva and Dulera;  Duonebs.  FEN - Con't TFs at goal. Add colace.     LOS: 4 days    Clovis Riley 09/04/2017

## 2017-09-04 NOTE — Progress Notes (Signed)
PHARMACY NOTE:  ANTIMICROBIAL RENAL DOSAGE ADJUSTMENT  Current antimicrobial regimen includes a mismatch between antimicrobial dosage and estimated renal function.  As per policy approved by the Pharmacy & Therapeutics and Medical Executive Committees, the antimicrobial dosage will be adjusted accordingly.  Current antimicrobial dosage:  Ceftriaxone 1 gm every 24 hours   Indication: Pneumonia  Renal Function:  Estimated Creatinine Clearance: 84.4 mL/min (by C-G formula based on SCr of 0.91 mg/dL).  Antimicrobial dosage has been changed to:  Ceftriaxone 2 gm every 24 hours for better lung penetration      Thank you for allowing pharmacy to be a part of this patient's care.  Susa Raring, PharmD, BCPS PGY2 Infectious Diseases Pharmacy Resident Phone X [phone] 09/04/2017 8:58 AM

## 2017-09-04 NOTE — Consult Note (Signed)
PULMONARY / CRITICAL CARE MEDICINE   Name: Derrick Sheppard MRN: [id number] DOB: 03-28-1956    ADMISSION DATE:  08/30/2017 CONSULTATION DATE:  09/02/2017  REFERRING MD:  Trauma Dr. Brantley Stage  CHIEF COMPLAINT:  hypoxia  HISTORY OF PRESENT ILLNESS:   61 year old male with PMH as below, which is significant for COPD, CAD, and GERD who was admitted to [facility] 11/20 after fall from 10 ft ladder. He was found to have multiple right sided rib fractures and a small pneumothorax. He was admitted to floor by trauma service for monitoring. He developed hypoxemia was unable to use IS due to pain. He was transferred to SDU for BiPAP. Then developed lethargy. CT scan of the chest demonstrated worsening PTX on 11/23 and chest tube was placed. He was transferred to ICU and intubated upon arrival. PCCM consulted.   SUBJECTIVE:  [name] just turned off No leak on tube  VITAL SIGNS: BP 114/63   Pulse (!) 118   Temp 100.2 F (37.9 C) (Axillary)   Resp (!) 21   Ht 5\' 7"  (1.702 m)   Wt 80.4 kg (177 lb 4 oz)   SpO2 94%   BMI 27.76 kg/m   HEMODYNAMICS: CVP:  [5 mmHg-19 mmHg] 15 mmHg  VENTILATOR SETTINGS: Vent Mode: PRVC FiO2 (%):  [50 %-80 %] 50 % Set Rate:  [26 bmp] 26 bmp Vt Set:  [530 mL] 530 mL PEEP:  [5 cmH20] 5 cmH20 Plateau Pressure:  [21 cmH20-24 cmH20] 21 cmH20  INTAKE / OUTPUT: I/O last 3 completed shifts: In: 7745.9 [I.V.:4559.2; NG/GT:1980; IV Piggyback:1206.7] Out: 2702 [Urine:2600; Chest Tube:102]  PHYSICAL EXAMINATION: General: rass -1 Neuro: rass -1, fc int HEENT: line left [place] wnl, dry, ett PULM: less coarse left  CV:  s1 s2 RRR GI : soft, BS wnl to low, no r Extremities:  No edema   LABS:  BMET Recent Labs  Lab 09/02/17 0415 09/03/17 0500 09/04/17 0344  NA 134* 137 139  K 3.6 4.2 4.3  CL 104 111 112*  CO2 23 22 24   BUN 18 25* 22*  CREATININE 1.45* 1.11 0.91  GLUCOSE 109* 145* 124*    Electrolytes Recent Labs  Lab 09/02/17 0415  09/02/17 1558  09/03/17 0500 09/03/17 2022 09/04/17 0344  CALCIUM 8.6*  --   --  8.0*  --  8.1*  MG  --    < > 1.4* 1.6* 2.6*  --   PHOS  --    < > 2.5 1.8* 1.9*  --    < > = values in this interval not displayed.    CBC Recent Labs  Lab 09/03/17 0000 09/03/17 0500 09/04/17 0344  WBC 14.1* 9.9 11.7*  HGB 11.1* 10.7* 10.2*  HCT 32.2* 31.6* 30.5*  PLT 215 193 206    Coag's No results for input(s): APTT, INR in the last 168 hours.  Sepsis Markers Recent Labs  Lab 08/30/17 1858 09/02/17 1119 09/03/17 0500 09/03/17 1146 09/04/17 0344  LATICACIDVEN 1.15 3.2*  --  1.3  --   PROCALCITON  --  58.37 45.88  --  30.49    ABG Recent Labs  Lab 09/02/17 1227 09/03/17 0932 09/04/17 0311  PHART 7.252* 7.248* 7.267*  PCO2ART 41.2 46.2 53.3*  PO2ART 91.0 42.0* 92.0    Liver Enzymes Recent Labs  Lab 08/30/17 1835 09/03/17 0500 09/04/17 0344  AST 23 35 41  ALT 15* 17 20  ALKPHOS 159* 106 189*  BILITOT 0.8 1.5* 1.2  ALBUMIN 3.5 2.2* 2.0*  Cardiac Enzymes No results for input(s): TROPONINI, PROBNP in the last 168 hours.  Glucose Recent Labs  Lab 09/03/17 1227 09/03/17 1715 09/03/17 1957 09/03/17 2322 09/04/17 0314 09/04/17 0816  GLUCAP 161* 144* 145* 139* 134* 128*    Imaging Dg Chest Port 1 View  Result Date: 09/04/2017 CLINICAL DATA:  Pneumonia. EXAM: PORTABLE CHEST 1 VIEW COMPARISON:  09/03/2017 FINDINGS: Endotracheal tube terminates just below the clavicular heads, well above the carina. Enteric tube courses into the stomach with tip not imaged. Right subclavian catheter and right pigtail pleural catheter are unchanged. The cardiomediastinal silhouette is unchanged. Right chest wall subcutaneous emphysema has slightly decreased. Bilateral lung opacities, greatest in the right mid lung and both lung bases, are unchanged. No sizable pleural effusion or definite pneumothorax is identified. IMPRESSION: 1. Slightly decreased subcutaneous emphysema in the right chest wall.  No definite pneumothorax. 2. Unchanged bilateral airspace opacities. Electronically Signed   By: Logan Bores M.D.   On: 09/04/2017 07:45     STUDIES:  CT head/Cspine 11/20 > Atrophy with minimal small vessel chronic ischemic changes of deep cerebral white matter. No acute intracranial abnormalities. Congenital fusion C4-C5. No acute cervical spine abnormalities. CT chest/abd/pelv 11/20 > Fractures of the RIGHT eighth ninth tenth and eleventh ribs. Dependent atelectasis in BILATERAL lower lobes. Tiny foci of RIGHT pneumothorax. No acute intra-abdominal or intrapelvic abnormalities. Prostatic enlargement. Small BILATERAL inguinal hernias. 3 mm nonspecific RIGHT lung nodule with an additional slightly irregular 8 mm diameter nodular density in the RIGHT upper lobe question nodule versus scarring ; recommendation below.  CT chest 11/23 > Increasing right pneumothorax since previous study. Extensive subcutaneous emphysema and pneumomediastinum, also increased since previous studies. Increasing consolidation or volume loss in the right lung with patchy multifocal areas of airspace and interstitial infiltration in both lungs, demonstrating progression. This could represent pneumonia or edema. Layering material within the trachea could indicate aspiration pneumonia. ncreased density in the gallbladder likely represents vicarious excretion of previous contrast material.  CULTURES: Tracheal asp 11/23 > Blood 11/23 >  ANTIBIOTICS: Cefepime 11/23 >11/23 ceftaz 11/23>>>11/25 Vanco 11/23 >11/25 CTX 11/25>>>  SIGNIFICANT EVENTS: 11/20 admit for fall and PTX 11/23 hypoxia, PTX worse. CT placed. Transfer to ICU intubated 11/23 sepsis, shock  LINES/TUBES: ETT 11/23 > Rt Chest tube 11/23 > Rt [place] line 11/23>>>  DISCUSSION: 61 year old male admitted with rib fx and PTX after fall 11/23. Developed hypoxia and lethargy and was transferred to ICU 11/23 for intubation. CT placed 11/23 as well after PTX  failed to improve.   ASSESSMENT / PLAN:  PULMONARY A: Acute hypoxemic respiratory failure: ATX 2/2 rib, Aspiration PNA, contusion COPD Right sided PTX Pulmonary nodules (stable on CT 11/23)  P:   No leak, no pTX - wateer seal tube, pcxr in 4 hours He still needs that high RR/ MV , see ph 7.26, I am reluctant to increase rate further with copd Consider TV increase with his copd and dead space, keep plat less 30 , given size, may be under ventilation from Tv perspective  CARDIOVASCULAR A:  Hypotension, likely sedation related.  Hx HTN Septi shock, SIRS  P:  [name] to map 65 cvp 14, kvo Cortisol repeat drop 8, consider stress roids if pressors restarted, they are off now  RENAL A:   AKI ATN resolving P:   1/2 NS to kvo Avoid saline with cl  GASTROINTESTINAL A:   GERD  P:   Feeding to goal, pivot as trauma Protonix IV BM needed, colace  per trauma Consider mir  HEMATOLOGIC A:   Mild anemia DV Tp revention P:  hgb lovenox Cbc in am   INFECTIOUS A:   Aspiration PNA vs HCAP (hosp day 3) PCT dropping and good clinical response P:   Ceftaz, vanc - dc Add ctx Will need 8 days in total ABX Remains culture neg  ENDOCRINE A:   rel AI    P:   Cortisol repeat low, but off pressors, add stress roids if drop s BP cbg  NEUROLOGIC A:   Acute metabolic encephalopathy - improved  P:   RASS goal: -1 Fentanyl infusion, versed PRN Daily WUA mandatory  FAMILY  - Updates: family updated bedside 11/23 by DF  - Inter-disciplinary family meet or Palliative Care meeting due by:  11/27  Ccm time 30 min   [name]. Titus Mould, MD, [place] Pgr: [facility] 09/04/2017 8:47 AM

## 2017-09-05 ENCOUNTER — Inpatient Hospital Stay (HOSPITAL_COMMUNITY): Payer: 59

## 2017-09-05 ENCOUNTER — Inpatient Hospital Stay (HOSPITAL_COMMUNITY): Payer: 59 | Admitting: Anesthesiology

## 2017-09-05 LAB — BASIC METABOLIC PANEL
ANION GAP: 4 — AB (ref 5–15)
BUN: 23 mg/dL — ABNORMAL HIGH (ref 6–20)
CHLORIDE: 111 mmol/L (ref 101–111)
CO2: 27 mmol/L (ref 22–32)
Calcium: 8.5 mg/dL — ABNORMAL LOW (ref 8.9–10.3)
Creatinine, Ser: 0.81 mg/dL (ref 0.61–1.24)
GFR calc non Af Amer: >60 mL/min (ref >60)
GLUCOSE: 154 mg/dL — AB (ref 65–99)
Potassium: 4.2 mmol/L (ref 3.5–5.1)
Sodium: 142 mmol/L (ref 135–145)

## 2017-09-05 LAB — GLUCOSE, CAPILLARY
GLUCOSE-CAPILLARY: 124 mg/dL — AB (ref 65–99)
GLUCOSE-CAPILLARY: 139 mg/dL — AB (ref 65–99)
GLUCOSE-CAPILLARY: 142 mg/dL — AB (ref 65–99)
GLUCOSE-CAPILLARY: 148 mg/dL — AB (ref 65–99)
Glucose-Capillary: 129 mg/dL — ABNORMAL HIGH (ref 65–99)
Glucose-Capillary: 143 mg/dL — ABNORMAL HIGH (ref 65–99)

## 2017-09-05 LAB — CBC WITH DIFFERENTIAL/PLATELET
Basophils Absolute: 0 10*3/uL (ref 0.0–0.1)
Basophils Relative: 0 %
Eosinophils Absolute: 0.4 10*3/uL (ref 0.0–0.7)
Eosinophils Relative: 5 %
HEMATOCRIT: 28.2 % — AB (ref 39.0–52.0)
HEMOGLOBIN: 9.3 g/dL — AB (ref 13.0–17.0)
LYMPHS ABS: 1.1 10*3/uL (ref 0.7–4.0)
LYMPHS PCT: 13 %
MCH: 29.3 pg (ref 26.0–34.0)
MCHC: 33 g/dL (ref 30.0–36.0)
MCV: 89 fL (ref 78.0–100.0)
MONO ABS: 0.7 10*3/uL (ref 0.1–1.0)
MONOS PCT: 8 %
NEUTROS ABS: 6.4 10*3/uL (ref 1.7–7.7)
NEUTROS PCT: 74 %
Platelets: 197 10*3/uL (ref 150–400)
RBC: 3.17 MIL/uL — ABNORMAL LOW (ref 4.22–5.81)
RDW: 14.6 % (ref 11.5–15.5)
WBC: 8.5 10*3/uL (ref 4.0–10.5)

## 2017-09-05 LAB — CULTURE, RESPIRATORY: CULTURE: NORMAL

## 2017-09-05 LAB — CULTURE, RESPIRATORY W GRAM STAIN

## 2017-09-05 MED ORDER — ROPIVACAINE HCL 2 MG/ML IJ SOLN
10.0000 mL/h | INTRAMUSCULAR | Status: DC
  Administered 2017-09-05 – 2017-09-13 (×10): 10 mL/h via EPIDURAL
  Filled 2017-09-05 (×19): qty 200

## 2017-09-05 MED ORDER — PHENYLEPHRINE 40 MCG/ML (10ML) SYRINGE FOR IV PUSH (FOR BLOOD PRESSURE SUPPORT)
80.0000 ug | PREFILLED_SYRINGE | INTRAVENOUS | Status: DC | PRN
  Filled 2017-09-05: qty 5

## 2017-09-05 MED ORDER — LIDOCAINE-EPINEPHRINE (PF) 1.5 %-1:200000 IJ SOLN
INTRAMUSCULAR | Status: DC | PRN
  Administered 2017-09-05 (×2): 6 mg via EPIDURAL

## 2017-09-05 MED ORDER — EPHEDRINE 5 MG/ML INJ
10.0000 mg | INTRAVENOUS | Status: DC | PRN
  Filled 2017-09-05: qty 2

## 2017-09-05 MED ORDER — EPHEDRINE SULFATE 50 MG/ML IJ SOLN: 10.0000 mg | INTRAMUSCULAR | Status: DC | PRN

## 2017-09-05 NOTE — Anesthesia Procedure Notes (Signed)
Epidural Patient location during procedure: ICU Start time: 09/05/2017 6:21 PM End time: 09/05/2017 6:50 PM  Staffing Anesthesiologist: Annye Asa, MD Performed: anesthesiologist   Preanesthetic Checklist Completed: patient identified, surgical consent, pre-op evaluation, timeout performed, IV checked, risks and benefits discussed and monitors and equipment checked  Epidural Patient position: left lateral decubitus Prep: site prepped and draped and DuraPrep Patient monitoring: heart rate, cardiac monitor, continuous pulse ox and blood pressure Approach: midline Location: thoracic (1-12) (T9,10) Injection technique: LOR air  Needle:  Needle type: Tuohy  Needle gauge: 17 G Needle length: 9 cm Needle insertion depth: 4 cm Catheter type: closed end flexible Catheter size: 19 Gauge Catheter at skin depth: 12 cm Test dose: negative and 1.5% lidocaine with Epi 1:200 K (6cc+6cc)  Assessment Events: blood not aspirated, injection not painful, no injection resistance, negative IV test and no paresthesia  Additional Notes Pt identified in ICU.  Monitors applied. Working IV access confirmed. Sterile prep, drape Thoraco-lumbar spine.  1% lido local T9,10.  #17ga [place] LOR air at 4 cm T9,10, cath in easily to 12 cm skin. Test dose OK, cath dosed and infusion begun.  Patient asymptomatic, VSS, no heme aspirated, tolerated well.  Jenita Seashore, MDReason for block:procedure for pain

## 2017-09-05 NOTE — Progress Notes (Signed)
PT Cancellation Note  Patient Details Name: Derrick Sheppard MRN: [id number] DOB: 03-05-1956   Cancelled Treatment:    Reason Eval/Treat Not Completed: Medical issues which prohibited therapy(pt not weaning well per RN and possible epidural cath to be placed. Will hold per RN and Dr.wyatt)   Sandy Salaam Nehal Witting 09/05/2017, 10:11 AM  Elwyn Reach, [place]

## 2017-09-05 NOTE — Anesthesia Pain Management Evaluation Note (Signed)
  Anesthesia Pain Consult Note  Patient: Derrick Sheppard, 61 y.o., male   Consult Requested by: Chucky May, MD Reason for Consult: analgesia for R 8-11 rib fractures   Level of Consciousness: lethargic  Pain: moderate   Last Vitals:  Vitals:   09/05/17 1600 09/05/17 1700  BP: (!) 147/73 123/65  Pulse: (!) 118 (!) 106  Resp: (!) 26 (!) 26  Temp:  (!) 38.2 C  SpO2: 95% 95%    Plan: Epidural infusion for pain control.  Risks of wet tap, epidural hematoma and spinal cord injury explained to: patient's wife and daughter   Consent:Risks of procedure as well as the alternatives and risks of each were explained to the (patient/caregiver).  Consent for procedure obtained. and formal consent was received from the patient's wife, who is POA  Allergies  Allergen Reactions  . Amlodipine Swelling    Makes his ankles and legs swell.  . Codeine Other (See Comments)    Gives him a Headache.    Physical exam: PULM expiratory rhonchi  CARDIO Heart regular rate and rhythm  OTHER    I have reviewed the patient's medications listed below. . budesonide (PULMICORT) nebulizer solution  0.5 mg Nebulization BID  . chlorhexidine gluconate (MEDLINE KIT)  15 mL Mouth Rinse BID  . Chlorhexidine Gluconate Cloth  6 each Topical Daily  . docusate  100 mg Oral BID  . enoxaparin (LOVENOX) injection  40 mg Subcutaneous Q24H  . ipratropium-albuterol  3 mL Nebulization Q6H  . mouth rinse  15 mL Mouth Rinse QID  . pantoprazole (PROTONIX) IV  40 mg Intravenous Q24H  . polyethylene glycol  17 g Oral Daily  . sodium chloride flush  10-40 mL Intracatheter Q12H   . sodium chloride 10 mL/hr at 09/05/17 1700  . cefTRIAXone (ROCEPHIN)  IV Stopped (09/05/17 1500)  . feeding supplement (PIVOT 1.5 CAL) 1,000 mL (09/05/17 1700)  . fentaNYL infusion INTRAVENOUS 300 mcg/hr (09/05/17 1703)  . phenylephrine ([name]-SYNEPHRINE) Adult infusion Stopped (09/04/17 0814)   acetaminophen (TYLENOL) oral liquid 160 mg/5 mL,  acetaminophen, fentaNYL, ipratropium-albuterol, midazolam, midazolam, [DISCONTINUED] ondansetron **OR** ondansetron (ZOFRAN) IV, sodium chloride flush  Past Medical History:  Diagnosis Date  . Allergic state   . Arthritis   . Asthma   . B12 deficiency   . COPD (chronic obstructive pulmonary disease) ([place])   . Coronary artery disease   . Dysrhythmia   . Edema   . GERD (gastroesophageal reflux disease)   . Headache   . Palpitations   . Pulmonary nodules    Past Surgical History:  Procedure Laterality Date  . COLONOSCOPY    . COLONOSCOPY WITH PROPOFOL N/A 04/09/2016   Procedure: COLONOSCOPY WITH PROPOFOL;  Surgeon: Lollie Sails, MD;  Location: [facility] ENDOSCOPY;  Service: Endoscopy;  Laterality: N/A;    reports that he has been smoking cigarettes.  He has a 42.00 pack-year smoking history. he has never used smokeless tobacco. He reports that he does not drink alcohol or use drugs.   The patient is intubated and sedated. He is unable to wean from the vent at this time. Dr. Hulen Skains has requested lower thoracic epidural placement, so that analgesia will facilitate weaning from ventilatory support.  Pratik Dalziel,E. Rogelio Seen 09/05/2017

## 2017-09-05 NOTE — Progress Notes (Signed)
Called into room by RT, patient had become very agitated after being suctioned and was popping himself off the vent. SpO2 decreased into low 70's, RT took patient off vent and bagged him to recover SpO2. O2 levels came back up within a few seconds. RT left patient on 100% FiO2 for now. Will continue to monitor.

## 2017-09-05 NOTE — Progress Notes (Addendum)
Follow up - Trauma and Critical Care  Patient Details:    Derrick Sheppard is an 60 y.o. male.  Lines/tubes : Airway 7.5 mm (Active)  Secured at (cm) 25 cm 09/05/2017  8:01 AM  Measured From Lips 09/05/2017  8:01 AM  Secured Location Right 09/05/2017  8:01 AM  Secured By [employer] 09/05/2017  8:01 AM  Tube Holder Repositioned Yes 09/05/2017  8:01 AM  Cuff Pressure (cm H2O) 30 cm H2O 09/02/2017  4:06 PM  Site Condition Dry 09/05/2017  8:01 AM     CVC Triple Lumen 09/02/17 Right Subclavian (Active)  Indication for Insertion or Continuance of Line Prolonged intravenous therapies 09/05/2017  8:00 AM  Site Assessment Clean;Dry;Intact 09/04/2017  8:00 PM  Proximal Lumen Status Infusing;Flushed 09/04/2017  8:00 PM  Medial Lumen Status Infusing;Flushed 09/04/2017  8:00 PM  Distal Lumen Status Flushed;In-line blood sampling system in place;Blood return noted 09/04/2017  8:00 PM  Dressing Type Transparent;Occlusive 09/04/2017  8:00 PM  Dressing Status Clean;Dry;Intact;Antimicrobial disc in place 09/04/2017  8:00 PM  Line Care Connections checked and tightened;Zeroed and calibrated 09/04/2017  8:00 PM  Dressing Change Due 09/09/17 09/04/2017  8:00 PM     Chest Tube Right;Anterior 14 Fr. (Active)  Suction To water seal 09/04/2017  8:00 PM  Chest Tube Air Leak None 09/04/2017  8:00 PM  Patency Intervention Tip/tilt 09/04/2017  8:00 PM  Drainage Description Serosanguineous 09/05/2017  1:00 AM  Dressing Status Clean;Dry;Intact 09/05/2017  1:00 AM  Dressing Intervention New dressing 09/05/2017  1:00 AM  Site Assessment Clean;Dry;Intact 09/05/2017  1:00 AM  Surrounding Skin Intact;Dry 09/05/2017  1:00 AM  Output (mL) 20 mL 09/05/2017  6:00 AM     NG/OG Tube Orogastric Center mouth Xray (Active)  External Length of Tube (cm) - (if applicable) 44 cm 25/02/3975  8:00 AM  Site Assessment Clean;Intact 09/04/2017  8:00 PM  Ongoing Placement Verification No change in cm markings or  external length of tube from initial placement;No change in respiratory status;No acute changes, not attributed to clinical condition 09/04/2017  8:00 PM  Status Infusing tube feed 09/04/2017  8:00 PM  Drainage Appearance Green 09/02/2017 10:00 AM  Output (mL) 200 mL 09/02/2017  2:30 PM     Urethral Catheter [name] (Active)  Indication for Insertion or Continuance of Catheter Acute urinary retention 09/05/2017  8:00 AM  Site Assessment Clean;Intact;Dry 09/04/2017  8:00 PM  Catheter Maintenance Bag below level of bladder;Catheter secured;Drainage bag/tubing not touching floor;Insertion date on drainage bag;No dependent loops;Seal intact 09/04/2017  8:00 PM  Collection Container Standard drainage bag 09/04/2017  8:00 PM  Securement Method Leg strap 09/04/2017  8:00 PM  Urinary Catheter Interventions Unclamped 09/04/2017  8:00 PM  Output (mL) 275 mL 09/05/2017  6:00 AM    Microbiology/Sepsis markers: Results for orders placed or performed during the hospital encounter of 08/30/17  MRSA PCR Screening     Status: None   Collection Time: 08/31/17  9:05 PM  Result Value Ref Range Status   MRSA by PCR NEGATIVE NEGATIVE Final    Comment:        The GeneXpert MRSA Assay (FDA approved for NASAL specimens only), is one component of a comprehensive MRSA colonization surveillance program. It is not intended to diagnose MRSA infection nor to guide or monitor treatment for MRSA infections.   Culture, respiratory (NON-Expectorated)     Status: None (Preliminary result)   Collection Time: 09/02/17 11:14 AM  Result Value Ref Range Status   Specimen  Description TRACHEAL ASPIRATE  Final   Special Requests NONE  Final   Gram Stain   Final    MODERATE WBC PRESENT, PREDOMINANTLY PMN RARE SQUAMOUS EPITHELIAL CELLS PRESENT ABUNDANT GRAM POSITIVE COCCI IN PAIRS IN CHAINS    Culture CULTURE REINCUBATED FOR BETTER GROWTH  Final   Report Status PENDING  Incomplete  Culture, blood (Routine X 2) w  Reflex to ID Panel     Status: None (Preliminary result)   Collection Time: 09/02/17 11:30 AM  Result Value Ref Range Status   Specimen Description BLOOD RIGHT ANTECUBITAL  Final   Special Requests IN PEDIATRIC BOTTLE Blood Culture adequate volume  Final   Culture NO GROWTH 2 DAYS  Final   Report Status PENDING  Incomplete  Culture, blood (Routine X 2) w Reflex to ID Panel     Status: None (Preliminary result)   Collection Time: 09/02/17 11:34 AM  Result Value Ref Range Status   Specimen Description BLOOD RIGHT ANTECUBITAL  Final   Special Requests IN PEDIATRIC BOTTLE Blood Culture adequate volume  Final   Culture NO GROWTH 2 DAYS  Final   Report Status PENDING  Incomplete    Anti-infectives:  Anti-infectives (From admission, onward)   Start     Dose/Rate Route Frequency Ordered Stop   09/04/17 1400  cefTRIAXone (ROCEPHIN) 2 g in dextrose 5 % 50 mL IVPB     2 g 100 mL/hr over 30 Minutes Intravenous Every 24 hours 09/04/17 0857     09/04/17 0900  cefTRIAXone (ROCEPHIN) 1 g in dextrose 5 % 50 mL IVPB  Status:  Discontinued     1 g 100 mL/hr over 30 Minutes Intravenous Every 24 hours 09/04/17 0851 09/04/17 0857   09/03/17 1400  cefTAZidime (FORTAZ) 2 g in dextrose 5 % 50 mL IVPB  Status:  Discontinued     2 g 100 mL/hr over 30 Minutes Intravenous Every 8 hours 09/03/17 1309 09/04/17 0851   09/02/17 1400  cefTAZidime (FORTAZ) 2 g in dextrose 5 % 50 mL IVPB  Status:  Discontinued     2 g 100 mL/hr over 30 Minutes Intravenous Every 12 hours 09/02/17 1122 09/03/17 1309   09/02/17 0430  vancomycin (VANCOCIN) IVPB 1000 mg/200 mL premix  Status:  Discontinued     1,000 mg 200 mL/hr over 60 Minutes Intravenous Every 12 hours 09/02/17 0418 09/04/17 0851   09/02/17 0430  ceFEPIme (MAXIPIME) 1 g in dextrose 5 % 50 mL IVPB  Status:  Discontinued     1 g 100 mL/hr over 30 Minutes Intravenous Every 8 hours 09/02/17 0418 09/02/17 1111      Best Practice/Protocols:  VTE Prophylaxis: Lovenox  (prophylaxtic dose) and (has not gotten dose since yuesterday at 1126.) and Mechanical GI Prophylaxis: Proton Pump Inhibitor Continous Sedation  Consults:     Events:  Subjective:    Overnight Issues: Has not been able to wean on the ventilator.  May benefit from epidural catheter.  Objective:  Vital signs for last 24 hours: Temp:  [98.6 F (37 C)-100.8 F (38.2 C)] 99.9 F (37.7 C) (11/26 0801) Pulse Rate:  [95-119] 101 (11/26 0600) Resp:  [13-27] 26 (11/26 0600) BP: (98-141)/(50-73) 128/66 (11/26 0600) SpO2:  [92 %-98 %] 92 % (11/26 0758) FiO2 (%):  [50 %] 50 % (11/26 0825) Weight:  [81.8 kg (180 lb 5.4 oz)] 81.8 kg (180 lb 5.4 oz) (11/26 0351)  Hemodynamic parameters for last 24 hours: CVP:  [10 mmHg-23 mmHg] 12 mmHg  Intake/Output from  previous day: 11/25 0701 - 11/26 0700 In: 2384.8 [P.O.:240; I.V.:929.7; NG/GT:1165.1; IV Piggyback:50] Out: 2140 [Urine:2100; Chest Tube:40]  Intake/Output this shift: No intake/output data recorded.  Vent settings for last 24 hours: Vent Mode: PRVC FiO2 (%):  [50 %] 50 % Set Rate:  [26 bmp] 26 bmp Vt Set:  [600 mL] 600 mL PEEP:  [5 cmH20] 5 cmH20 Pressure Support:  [8 cmH20] 8 cmH20 Plateau Pressure:  [27 ZLD35-70 cmH20] 28 cmH20  Physical Exam:  General: no respiratory distress and currently sedated Neuro: nonfocal exam and RASS -1 HEENT/Neck: ETT WNL  Resp: rhonchi LLL and LUL and wheezes LLL and LUL CVS: regular rate and rhythm, S1, S2 normal, no murmur, click, rub or gallop and but heart rate up to the 120's with weaning. GI: soft, nontender, BS WNL, no r/g and tolerating tube feedings. well. Extremities: no edema, no erythema, pulses WNL  Results for orders placed or performed during the hospital encounter of 08/30/17 (from the past 24 hour(s))  Glucose, capillary     Status: Abnormal   Collection Time: 09/04/17 12:31 PM  Result Value Ref Range   Glucose-Capillary 123 (H) 65 - 99 mg/dL  Glucose, capillary      Status: Abnormal   Collection Time: 09/04/17  4:29 PM  Result Value Ref Range   Glucose-Capillary 128 (H) 65 - 99 mg/dL  Glucose, capillary     Status: Abnormal   Collection Time: 09/04/17  7:45 PM  Result Value Ref Range   Glucose-Capillary 136 (H) 65 - 99 mg/dL  Glucose, capillary     Status: Abnormal   Collection Time: 09/04/17 11:31 PM  Result Value Ref Range   Glucose-Capillary 130 (H) 65 - 99 mg/dL  Glucose, capillary     Status: Abnormal   Collection Time: 09/05/17  3:39 AM  Result Value Ref Range   Glucose-Capillary 129 (H) 65 - 99 mg/dL  Basic metabolic panel     Status: Abnormal   Collection Time: 09/05/17  4:28 AM  Result Value Ref Range   Sodium 142 135 - 145 mmol/L   Potassium 4.2 3.5 - 5.1 mmol/L   Chloride 111 101 - 111 mmol/L   CO2 27 22 - 32 mmol/L   Glucose, Bld 154 (H) 65 - 99 mg/dL   BUN 23 (H) 6 - 20 mg/dL   Creatinine, Ser 0.81 0.61 - 1.24 mg/dL   Calcium 8.5 (L) 8.9 - 10.3 mg/dL   GFR calc non Af Amer >60 >60 mL/min   GFR calc Af Amer >60 >60 mL/min   Anion gap 4 (L) 5 - 15  CBC with Differential/Platelet     Status: Abnormal   Collection Time: 09/05/17  4:28 AM  Result Value Ref Range   WBC 8.5 4.0 - 10.5 K/uL   RBC 3.17 (L) 4.22 - 5.81 MIL/uL   Hemoglobin 9.3 (L) 13.0 - 17.0 g/dL   HCT 28.2 (L) 39.0 - 52.0 %   MCV 89.0 78.0 - 100.0 fL   MCH 29.3 26.0 - 34.0 pg   MCHC 33.0 30.0 - 36.0 g/dL   RDW 14.6 11.5 - 15.5 %   Platelets 197 150 - 400 K/uL   Neutrophils Relative % 74 %   Neutro Abs 6.4 1.7 - 7.7 K/uL   Lymphocytes Relative 13 %   Lymphs Abs 1.1 0.7 - 4.0 K/uL   Monocytes Relative 8 %   Monocytes Absolute 0.7 0.1 - 1.0 K/uL   Eosinophils Relative 5 %   Eosinophils Absolute 0.4  0.0 - 0.7 K/uL   Basophils Relative 0 %   Basophils Absolute 0.0 0.0 - 0.1 K/uL  Glucose, capillary     Status: Abnormal   Collection Time: 09/05/17  8:42 AM  Result Value Ref Range   Glucose-Capillary 142 (H) 65 - 99 mg/dL   Comment 1 Notify RN    Comment 2  Document in Chart      Assessment/Plan:   NEURO  Altered Mental Status:  agitation and sedation   Plan: Not planning on weaning anytime soon  PULM  Respiratory Acidosis (acute and rib fractures) Atelectasis/collapse (focal and bibasilar)  Right chest tube in place, lung is full expanded.  No pneumothorax   Plan: CPM  CARDIO  Sinus Tachycardia sinus   Plan: CPM  RENAL  Urine output nad renal function are good.   Plan: CPM  GI  No specific issues   Plan: CPM  ID  No known infectious sources   Plan: CPM  HEME  Anemia acute blood loss anemia)   Plan: CPM  ENDO No known issues   Plan: CPM  Global Issues  Patient had to be intubated for respiratory failure secondary to his multiple rib fractures and history of emphysema.  He may benefit from an epidural catheter.    LOS: 5 days   Additional comments:I reviewed the patient's new clinical lab test results. cbc/bmet and I reviewed the patients new imaging test results. cxr  Critical Care Total Time*: 30 Minutes  Judeth Horn 09/05/2017  *Care during the described time interval was provided by me and/or other providers on the critical care team.  I have reviewed this patient's available data, including medical history, events of note, physical examination and test results as part of my evaluation.

## 2017-09-05 NOTE — Progress Notes (Signed)
Vent check performed tried to pass suction cath down ET tube was hard to pass had to lavage with saline pulled large plug from patient Sp02 decreased had to take off vent and bag to recover patients saturation got in the low 70's within a few seconds sp02 came back and was able to place pt back on ventilator and on 100% Fi02 pt Spo2 now 93%.  Pt having pain issues making him uncomfortable and is being taken care of now.  Rt to continue to monitor.

## 2017-09-05 NOTE — Plan of Care (Signed)
Pt currently has tube feeds infusing.  Katherine Mantle RN

## 2017-09-06 ENCOUNTER — Inpatient Hospital Stay (HOSPITAL_COMMUNITY): Payer: 59

## 2017-09-06 ENCOUNTER — Encounter (HOSPITAL_COMMUNITY): Payer: Self-pay | Admitting: Anesthesiology

## 2017-09-06 LAB — GLUCOSE, CAPILLARY
GLUCOSE-CAPILLARY: 120 mg/dL — AB (ref 65–99)
Glucose-Capillary: 126 mg/dL — ABNORMAL HIGH (ref 65–99)
Glucose-Capillary: 130 mg/dL — ABNORMAL HIGH (ref 65–99)
Glucose-Capillary: 130 mg/dL — ABNORMAL HIGH (ref 65–99)
Glucose-Capillary: 133 mg/dL — ABNORMAL HIGH (ref 65–99)
Glucose-Capillary: 157 mg/dL — ABNORMAL HIGH (ref 65–99)

## 2017-09-06 LAB — BASIC METABOLIC PANEL
Anion gap: 4 — ABNORMAL LOW (ref 5–15)
BUN: 26 mg/dL — ABNORMAL HIGH (ref 6–20)
CALCIUM: 8.8 mg/dL — AB (ref 8.9–10.3)
CO2: 30 mmol/L (ref 22–32)
CREATININE: 0.76 mg/dL (ref 0.61–1.24)
Chloride: 112 mmol/L — ABNORMAL HIGH (ref 101–111)
GFR calc non Af Amer: >60 mL/min (ref >60)
Glucose, Bld: 155 mg/dL — ABNORMAL HIGH (ref 65–99)
Potassium: 4.5 mmol/L (ref 3.5–5.1)
Sodium: 146 mmol/L — ABNORMAL HIGH (ref 135–145)

## 2017-09-06 LAB — CBC WITH DIFFERENTIAL/PLATELET
BASOS PCT: 0 %
Basophils Absolute: 0 10*3/uL (ref 0.0–0.1)
EOS ABS: 0.4 10*3/uL (ref 0.0–0.7)
EOS PCT: 5 %
HEMATOCRIT: 28.6 % — AB (ref 39.0–52.0)
Hemoglobin: 9.3 g/dL — ABNORMAL LOW (ref 13.0–17.0)
Lymphocytes Relative: 15 %
Lymphs Abs: 1.3 10*3/uL (ref 0.7–4.0)
MCH: 29.2 pg (ref 26.0–34.0)
MCHC: 32.5 g/dL (ref 30.0–36.0)
MCV: 89.9 fL (ref 78.0–100.0)
MONO ABS: 1.1 10*3/uL — AB (ref 0.1–1.0)
MONOS PCT: 12 %
NEUTROS ABS: 6.2 10*3/uL (ref 1.7–7.7)
Neutrophils Relative %: 68 %
PLATELETS: 210 10*3/uL (ref 150–400)
RBC: 3.18 MIL/uL — ABNORMAL LOW (ref 4.22–5.81)
RDW: 14.8 % (ref 11.5–15.5)
WBC: 9 10*3/uL (ref 4.0–10.5)

## 2017-09-06 NOTE — Progress Notes (Signed)
PT Cancellation Note  Patient Details Name: Derrick Sheppard MRN: [id number] DOB: Sep 01, 1956   Cancelled Treatment:    Reason Eval/Treat Not Completed: Medical issues which prohibited therapy Per RN, pt had a rough night and not ready for PT today. Will follow for appropriateness.    Marguarite Arbour A Genevieve Ritzel 09/06/2017, 10:59 AM Wray Kearns, [place], DPT [phone]

## 2017-09-06 NOTE — Care Management Note (Signed)
Case Management Note  Patient Details  Name: Derrick Sheppard MRN: [id number] Date of Birth: [birth year]  Subjective/Objective:  Pt admitted on 08/30/17 s/p fall approximately 10 feet from a ladder.  Pt sustained multiple RT rib fx and trace PTX.   Pt with worsening PTX on 09/02/17 with CT placement and intubation.  PTA, pt independent, lives at home with spouse.                Action/Plan: Pt remains intubated with epidural for pain control.  Will continue to follow for discharge planning as pt progresses.    Expected Discharge Date:                 Expected Discharge Plan:     In-House Referral:  Clinical Social Work  Discharge planning Services  CM Consult  Post Acute Care Choice:    Choice offered to:     DME Arranged:    DME Agency:     HH Arranged:    HH Agency:     Status of Service:  In process, will continue to follow  If discussed at Long Length of Stay Meetings, dates discussed:    Additional Comments:  Reinaldo Raddle, RN, BSN  Trauma/Neuro ICU Case Manager [phone]

## 2017-09-06 NOTE — Progress Notes (Signed)
Subjective/Chief Complaint: Pt w/ mucus plug overnight, req increase O2 support Weaning O2 down this AM Epidural placed yesterday  Objective: Vital signs in last 24 hours: Temp:  [98.5 F (36.9 C)-100.8 F (38.2 C)] 98.5 F (36.9 C) (11/27 0400) Pulse Rate:  [88-125] 114 (11/27 0800) Resp:  [13-29] 29 (11/27 0800) BP: (86-165)/(46-103) 165/88 (11/27 0800) SpO2:  [92 %-98 %] 96 % (11/27 0800) FiO2 (%):  [40 %-100 %] 70 % (11/27 0747) Weight:  [80.6 kg (177 lb 11.1 oz)] 80.6 kg (177 lb 11.1 oz) (11/27 0437) Last BM Date: 08/30/17  Intake/Output from previous day: 11/26 0701 - 11/27 0700 In: 2220.3 [I.V.:796.7; NG/GT:1320] Out: 2310 [Urine:2310] Intake/Output this shift: Total I/O In: 86.7 [I.V.:21.7; Other:10; NG/GT:55] Out: 150 [Urine:150]  Constitutional: No acute distress, intubated, appears states age. Eyes: Anicteric sclerae, moist conjunctiva, no lid lag Lungs: Clear to auscultation bilaterally, normal respiratory effort CV: regular rate and rhythm, no murmurs, no peripheral edema, pedal pulses 2+ GI: Soft, no masses or hepatosplenomegaly, non-tender to palpation Skin: No rashes, palpation reveals normal turgor Psychiatric: sedated & intubated due to injury  Lab Results:  Recent Labs    09/05/17 0428 09/06/17 0423  WBC 8.5 9.0  HGB 9.3* 9.3*  HCT 28.2* 28.6*  PLT 197 210   BMET Recent Labs    09/05/17 0428 09/06/17 0423  NA 142 146*  K 4.2 4.5  CL 111 112*  CO2 27 30  GLUCOSE 154* 155*  BUN 23* 26*  CREATININE 0.81 0.76  CALCIUM 8.5* 8.8*   PT/INR No results for input(s): LABPROT, INR in the last 72 hours. ABG Recent Labs    09/03/17 0932 09/04/17 0311  PHART 7.248* 7.267*  HCO3 20.2 23.7    Studies/Results: Dg Chest Port 1 View  Result Date: 09/06/2017 CLINICAL DATA:  Chest trauma chest tube EXAM: PORTABLE CHEST 1 VIEW COMPARISON:  09/05/2017 FINDINGS: Endotracheal tube in good position. NG tube unchanged. Central line in the SVC.  Pigtail catheter in the right medial chest unchanged from the prior study. Epidural catheter has been placed in the interval. Extensive bibasilar airspace disease has progressed. Underlying COPD. Small effusions. No pneumothorax IMPRESSION: Progression of bibasilar airspace disease. Negative for pneumothorax Epidural catheter for pain control. Electronically Signed   By: Franchot Gallo M.D.   On: 09/06/2017 07:39   Dg Chest Port 1 View  Result Date: 09/05/2017 CLINICAL DATA:  61 year old male with history of pneumonia. Evaluate chest tube placement. EXAM: PORTABLE CHEST 1 VIEW COMPARISON:  Chest x-ray 09/04/2017. FINDINGS: Previously noted right-sided small bore chest tube remains in position with the pigtail in the mid to upper medial right hemithorax. No appreciable pneumothorax is identified at this time. There is a right-sided subclavian central venous catheter with tip terminating in the distal superior vena cava. An endotracheal tube is in place with tip 6.6 cm above the carina. A nasogastric tube is seen extending into the stomach, however, the tip of the nasogastric tube extends below the lower margin of the image. Lung volumes are low. Extensive patchy asymmetrically distributed interstitial and airspace opacities are again noted throughout the lungs bilaterally, most confluent throughout the right mid to lower lung and throughout the left lung base. Overall aeration has slightly improved compared to yesterday's examination. Probable small left pleural effusion. No evidence of pulmonary edema. Heart size is normal. Upper mediastinal contours are within normal limits allowing for patient position. Aortic atherosclerosis. Subcutaneous emphysema in the right chest wall. IMPRESSION: 1. Support apparatus, as above.  2. Findings appear most compatible with severe multilobar pneumonia, with slight improved aeration compared to yesterday's examination. 3. Small left pleural effusion. 4. Aortic atherosclerosis.  Electronically Signed   By: Vinnie Langton M.D.   On: 09/05/2017 07:42   Dg Chest Port 1 View  Result Date: 09/04/2017 CLINICAL DATA:  61 year old male with pneumothorax. EXAM: PORTABLE CHEST 1 VIEW COMPARISON:  09/04/2017 FINDINGS: Endotracheal tube approximately 5 cm above the level of the carina. An enteric tube courses below the diaphragm beyond the edge of the film. A pigtail catheter overlies the medial right hemithorax. Right subclavian central venous catheter appears in stable position. Cardiomediastinal silhouette is stable in size and configuration. There are diffuse emphysematous changes with patchy airspace opacities most confluent at the right mid and bilateral bases. No definite pneumothorax identified. No sizable pleural effusions. Subcutaneous emphysema again note along the lateral right chest wall. The soft tissues and osseous structures otherwise grossly unremarkable. IMPRESSION: 1. Grossly unchanged appearance of the chest with patchy airspace opacities, most confluent at the bilateral bases and right mid lung. 2. Extensive subcutaneous emphysema. No definite pneumothorax visualized. 3. Life-support devices as described. Electronically Signed   By: Kristopher Oppenheim M.D.   On: 09/04/2017 13:58    Anti-infectives: Anti-infectives (From admission, onward)   Start     Dose/Rate Route Frequency Ordered Stop   09/04/17 1400  cefTRIAXone (ROCEPHIN) 2 g in dextrose 5 % 50 mL IVPB     2 g 100 mL/hr over 30 Minutes Intravenous Every 24 hours 09/04/17 0857     09/04/17 0900  cefTRIAXone (ROCEPHIN) 1 g in dextrose 5 % 50 mL IVPB  Status:  Discontinued     1 g 100 mL/hr over 30 Minutes Intravenous Every 24 hours 09/04/17 0851 09/04/17 0857   09/03/17 1400  cefTAZidime (FORTAZ) 2 g in dextrose 5 % 50 mL IVPB  Status:  Discontinued     2 g 100 mL/hr over 30 Minutes Intravenous Every 8 hours 09/03/17 1309 09/04/17 0851   09/02/17 1400  cefTAZidime (FORTAZ) 2 g in dextrose 5 % 50 mL IVPB   Status:  Discontinued     2 g 100 mL/hr over 30 Minutes Intravenous Every 12 hours 09/02/17 1122 09/03/17 1309   09/02/17 0430  vancomycin (VANCOCIN) IVPB 1000 mg/200 mL premix  Status:  Discontinued     1,000 mg 200 mL/hr over 60 Minutes Intravenous Every 12 hours 09/02/17 0418 09/04/17 0851   09/02/17 0430  ceFEPIme (MAXIPIME) 1 g in dextrose 5 % 50 mL IVPB  Status:  Discontinued     1 g 100 mL/hr over 30 Minutes Intravenous Every 8 hours 09/02/17 0418 09/02/17 1111      Assessment/Plan: Fall from ladder 10 feet Multiple right rib fractures.-multimodal pain control, epidural Right pneumothorax- DC pigtail this AM, repeat CXR later today VDRF- Appreciate CCM help with vent, will wean as tol COPD and tobacco abuse.-Continue Spiriva and Dulera; Duonebs.  FEN - Con't TFs at goal. Add colace.   CC time: 30 min   LOS: 6 days    Rosario Jacks., [facility] 09/06/2017

## 2017-09-06 NOTE — Progress Notes (Signed)
Anesthesiology Epidural Note Pt remains intubated on fentanyl. Site CDI. Spontaneously moving all 4 extremities. Discussed with RN at bedside.  Vitals:   09/06/17 1000 09/06/17 1143  BP: (!) 149/79   Pulse: (!) 105   Resp: (!) 26   Temp:    SpO2: 95% 94%   Plan: Continue epidural. Wait for extubation.

## 2017-09-07 ENCOUNTER — Inpatient Hospital Stay (HOSPITAL_COMMUNITY): Payer: 59

## 2017-09-07 LAB — GLUCOSE, CAPILLARY
Glucose-Capillary: 125 mg/dL — ABNORMAL HIGH (ref 65–99)
Glucose-Capillary: 126 mg/dL — ABNORMAL HIGH (ref 65–99)
Glucose-Capillary: 126 mg/dL — ABNORMAL HIGH (ref 65–99)
Glucose-Capillary: 131 mg/dL — ABNORMAL HIGH (ref 65–99)
Glucose-Capillary: 139 mg/dL — ABNORMAL HIGH (ref 65–99)
Glucose-Capillary: 139 mg/dL — ABNORMAL HIGH (ref 65–99)

## 2017-09-07 LAB — BLOOD GAS, ARTERIAL
Acid-Base Excess: 7.7 mmol/L — ABNORMAL HIGH (ref 0.0–2.0)
Bicarbonate: 31.3 mmol/L — ABNORMAL HIGH (ref 20.0–28.0)
Drawn by: 129711
FIO2: 40
O2 SAT: 92.4 %
PATIENT TEMPERATURE: 100.7
PEEP: 5 cmH2O
PH ART: 7.479 — AB (ref 7.350–7.450)
RATE: 26 resp/min
VT: 600 mL
pCO2 arterial: 43.1 mmHg (ref 32.0–48.0)
pO2, Arterial: 65.6 mmHg — ABNORMAL LOW (ref 83.0–108.0)

## 2017-09-07 LAB — CBC
HCT: 28.3 % — ABNORMAL LOW (ref 39.0–52.0)
HEMOGLOBIN: 8.9 g/dL — AB (ref 13.0–17.0)
MCH: 28.7 pg (ref 26.0–34.0)
MCHC: 31.4 g/dL (ref 30.0–36.0)
MCV: 91.3 fL (ref 78.0–100.0)
Platelets: 271 10*3/uL (ref 150–400)
RBC: 3.1 MIL/uL — AB (ref 4.22–5.81)
RDW: 15.2 % (ref 11.5–15.5)
WBC: 10 10*3/uL (ref 4.0–10.5)

## 2017-09-07 LAB — CULTURE, RESPIRATORY

## 2017-09-07 LAB — CULTURE, BLOOD (ROUTINE X 2)
Culture: NO GROWTH
Culture: NO GROWTH
SPECIAL REQUESTS: ADEQUATE
Special Requests: ADEQUATE

## 2017-09-07 LAB — BASIC METABOLIC PANEL
Anion gap: 4 — ABNORMAL LOW (ref 5–15)
BUN: 29 mg/dL — AB (ref 6–20)
CHLORIDE: 112 mmol/L — AB (ref 101–111)
CO2: 29 mmol/L (ref 22–32)
CREATININE: 0.84 mg/dL (ref 0.61–1.24)
Calcium: 8.5 mg/dL — ABNORMAL LOW (ref 8.9–10.3)
GFR calc Af Amer: >60 mL/min (ref >60)
GFR calc non Af Amer: >60 mL/min (ref >60)
GLUCOSE: 153 mg/dL — AB (ref 65–99)
POTASSIUM: 4.4 mmol/L (ref 3.5–5.1)
Sodium: 145 mmol/L (ref 135–145)

## 2017-09-07 LAB — CULTURE, RESPIRATORY W GRAM STAIN: Special Requests: NORMAL

## 2017-09-07 MED ORDER — ENOXAPARIN SODIUM 40 MG/0.4ML ~~LOC~~ SOLN
40.0000 mg | SUBCUTANEOUS | Status: DC
  Administered 2017-09-07 – 2017-09-16 (×9): 40 mg via SUBCUTANEOUS
  Filled 2017-09-07 (×8): qty 0.4

## 2017-09-07 MED ORDER — FUROSEMIDE 10 MG/ML IJ SOLN
40.0000 mg | Freq: Once | INTRAMUSCULAR | Status: AC
  Administered 2017-09-07: 40 mg via INTRAVENOUS
  Filled 2017-09-07: qty 4

## 2017-09-07 MED ORDER — ORAL CARE MOUTH RINSE
15.0000 mL | OROMUCOSAL | Status: DC
  Administered 2017-09-07 – 2017-09-13 (×64): 15 mL via OROMUCOSAL

## 2017-09-07 MED ORDER — GUAIFENESIN 100 MG/5ML PO SOLN
5.0000 mL | ORAL | Status: DC | PRN
  Administered 2017-09-09: 100 mg
  Filled 2017-09-07: qty 5

## 2017-09-07 NOTE — Progress Notes (Signed)
Spoke with Dr Hulen Skains and Dr Fransisco Beau of Anesthesia who agree enoxaparin 40 mg for dvt prophylaxis is ok for this patient while epidural is in place.   Recommendations are to remove catheter no sooner than 12 hours after previous dose of enoxaparin.  Levester Fresh, PharmD, BCPS, BCCCP Clinical Pharmacist 09/07/2017 5:32 PM

## 2017-09-07 NOTE — Plan of Care (Signed)
Care plan updated per protocol.

## 2017-09-07 NOTE — Anesthesia Post-op Follow-up Note (Signed)
  Anesthesia Pain Follow-up Note  Patient: Derrick Sheppard  Day #: 2  Date of Follow-up: 09/07/2017 Time: 2:52 PM  Last Vitals:  Vitals:   09/07/17 1300 09/07/17 1400  BP: (!) 146/79 139/77  Pulse: 98 (!) 101  Resp: (!) 22 (!) 24  Temp:    SpO2: 93% 93%    Level of Consciousness: Sedated, follows commands  Pain: moderate   Side Effects:None  Catheter Site Exam: Clean, Dry, Intact. No induration or tenderness at insertion point.  Epidural / Intrathecal (From admission, onward)   Start     Dose/Rate Route Frequency Ordered Stop   09/05/17 2015  ropivacaine (PF) 2 mg/mL (0.2%) (NAROPIN) injection     10 mL/hr 10 mL/hr  Epidural Continuous 09/05/17 2002 09/09/17 2014      Discussed with RN at bedside. Improvement in ventilatory status post epidural placement. Weaning of ventilator in process.   Plan: Continue current therapy of postop epidural at surgeon's request   Audry Pili

## 2017-09-07 NOTE — Progress Notes (Signed)
PT Cancellation Note  Patient Details Name: Derrick Sheppard MRN: [id number] DOB: [birth year]   Cancelled Treatment:    Reason Eval/Treat Not Completed: Medical issues which prohibited therapy(spoke with nsg, pain control still an issue, hold PT)   Duncan Dull 09/07/2017, 5:21 PM Alben Deeds, PT DPT  Board Certified Neurologic Specialist [phone]

## 2017-09-07 NOTE — Progress Notes (Signed)
Follow up - Trauma Critical Care  Patient Details:    Derrick Sheppard is an 61 y.o. male.  Lines/tubes : Airway 7.5 mm (Active)  Secured at (cm) 25 cm 09/07/2017  7:28 AM  Measured From Lips 09/07/2017  7:28 AM  Secured Location Right 09/07/2017  7:28 AM  Secured By [employer] 09/07/2017  7:28 AM  Tube Holder Repositioned Yes 09/07/2017  7:28 AM  Cuff Pressure (cm H2O) 22 cm H2O 09/07/2017  7:28 AM  Site Condition Dry 09/07/2017  7:28 AM     CVC Triple Lumen 09/02/17 Right Subclavian (Active)  Indication for Insertion or Continuance of Line Prolonged intravenous therapies 09/07/2017  8:00 AM  Site Assessment Clean;Dry;Intact 09/07/2017  8:00 AM  Proximal Lumen Status Infusing 09/06/2017  8:00 PM  Medial Lumen Status Infusing 09/06/2017  8:00 PM  Distal Lumen Status In-line blood sampling system in place;Blood return noted 09/06/2017  8:00 PM  Dressing Type Transparent;Occlusive 09/07/2017  8:00 AM  Dressing Status Clean;Dry;Intact;Antimicrobial disc in place 09/07/2017  8:00 AM  Line Care Connections checked and tightened;Zeroed and calibrated 09/07/2017  8:00 AM  Dressing Change Due 09/09/17 09/07/2017  8:00 AM     Epidural Catheter 09/05/17 (Active)  Site Assessment Clean;Dry;Intact 09/07/2017  8:00 AM  Line Status Infusing 09/07/2017  8:00 AM  Dressing Status Clean;Dry;Intact 09/07/2017  8:00 AM  Dressing Change Due 09/12/17 09/07/2017  8:00 AM     NG/OG Tube Orogastric Center mouth Xray (Active)  External Length of Tube (cm) - (if applicable) 44 cm 67/[birth year]1937  8:00 AM  Site Assessment Clean;Intact 09/06/2017  8:00 PM  Ongoing Placement Verification No change in cm markings or external length of tube from initial placement;No change in respiratory status;No acute changes, not attributed to clinical condition;Xray 09/06/2017  8:00 PM  Status Infusing tube feed 09/06/2017  8:00 PM  Drainage Appearance Green 09/05/2017  8:00 PM  Output (mL) 200 mL 09/06/2017   2:00 PM     Urethral Catheter Derrick Sheppard (Active)  Indication for Insertion or Continuance of Catheter Acute urinary retention 09/07/2017  8:00 AM  Site Assessment Clean;Intact;Dry 09/07/2017  8:00 AM  Catheter Maintenance Bag below level of bladder;Catheter secured;Drainage bag/tubing not touching floor;Insertion date on drainage bag;No dependent loops;Bag emptied prior to transport;Seal intact 09/07/2017  8:00 AM  Collection Container Standard drainage bag 09/07/2017  8:00 AM  Securement Method Leg strap 09/07/2017  8:00 AM  Urinary Catheter Interventions Unclamped 09/07/2017  8:00 AM  Output (mL) 75 mL 09/07/2017  8:00 AM    Microbiology/Sepsis markers: Results for orders placed or performed during the hospital encounter of 08/30/17  MRSA PCR Screening     Status: None   Collection Time: 08/31/17  9:05 PM  Result Value Ref Range Status   MRSA by PCR NEGATIVE NEGATIVE Final    Comment:        The GeneXpert MRSA Assay (FDA approved for NASAL specimens only), is one component of a comprehensive MRSA colonization surveillance program. It is not intended to diagnose MRSA infection nor to guide or monitor treatment for MRSA infections.   Culture, respiratory (NON-Expectorated)     Status: None   Collection Time: 09/02/17 11:14 AM  Result Value Ref Range Status   Specimen Description TRACHEAL ASPIRATE  Final   Special Requests NONE  Final   Gram Stain   Final    MODERATE WBC PRESENT, PREDOMINANTLY PMN RARE SQUAMOUS EPITHELIAL CELLS PRESENT ABUNDANT GRAM POSITIVE COCCI IN PAIRS IN CHAINS    Culture  Consistent with normal respiratory flora.  Final   Report Status 09/05/2017 FINAL  Final  Culture, blood (Routine X 2) w Reflex to ID Panel     Status: None (Preliminary result)   Collection Time: 09/02/17 11:30 AM  Result Value Ref Range Status   Specimen Description BLOOD RIGHT ANTECUBITAL  Final   Special Requests IN PEDIATRIC BOTTLE Blood Culture adequate volume  Final   Culture  NO GROWTH 4 DAYS  Final   Report Status PENDING  Incomplete  Culture, blood (Routine X 2) w Reflex to ID Panel     Status: None (Preliminary result)   Collection Time: 09/02/17 11:34 AM  Result Value Ref Range Status   Specimen Description BLOOD RIGHT ANTECUBITAL  Final   Special Requests IN PEDIATRIC BOTTLE Blood Culture adequate volume  Final   Culture NO GROWTH 4 DAYS  Final   Report Status PENDING  Incomplete  Culture, respiratory (NON-Expectorated)     Status: None (Preliminary result)   Collection Time: 09/05/17 11:38 AM  Result Value Ref Range Status   Specimen Description TRACHEAL ASPIRATE  Final   Special Requests Normal  Final   Gram Stain   Final    FEW WBC PRESENT, PREDOMINANTLY MONONUCLEAR NO SQUAMOUS EPITHELIAL CELLS SEEN FEW YEAST WITH PSEUDOHYPHAE RARE GRAM POSITIVE COCCI IN PAIRS IN CLUSTERS    Culture CULTURE REINCUBATED FOR BETTER GROWTH  Final   Report Status PENDING  Incomplete    Anti-infectives:  Anti-infectives (From admission, onward)   Start     Dose/Rate Route Frequency Ordered Stop   09/04/17 1400  cefTRIAXone (ROCEPHIN) 2 Sheppard in dextrose 5 % 50 mL IVPB     2 Sheppard 100 mL/hr over 30 Minutes Intravenous Every 24 hours 09/04/17 0857     09/04/17 0900  cefTRIAXone (ROCEPHIN) 1 Sheppard in dextrose 5 % 50 mL IVPB  Status:  Discontinued     1 Sheppard 100 mL/hr over 30 Minutes Intravenous Every 24 hours 09/04/17 0851 09/04/17 0857   09/03/17 1400  cefTAZidime (FORTAZ) 2 Sheppard in dextrose 5 % 50 mL IVPB  Status:  Discontinued     2 Sheppard 100 mL/hr over 30 Minutes Intravenous Every 8 hours 09/03/17 1309 09/04/17 0851   09/02/17 1400  cefTAZidime (FORTAZ) 2 Sheppard in dextrose 5 % 50 mL IVPB  Status:  Discontinued     2 Sheppard 100 mL/hr over 30 Minutes Intravenous Every 12 hours 09/02/17 1122 09/03/17 1309   09/02/17 0430  vancomycin (VANCOCIN) IVPB 1000 mg/200 mL premix  Status:  Discontinued     1,000 mg 200 mL/hr over 60 Minutes Intravenous Every 12 hours 09/02/17 0418 09/04/17 0851    09/02/17 0430  ceFEPIme (MAXIPIME) 1 Sheppard in dextrose 5 % 50 mL IVPB  Status:  Discontinued     1 Sheppard 100 mL/hr over 30 Minutes Intravenous Every 8 hours 09/02/17 0418 09/02/17 1111      Best Practice/Protocols:  VTE Prophylaxis: Mechanical Continous Sedation Hyperglycemia (ICU) GI prophylaxis  Consults:     Studies:    Events:  Subjective:    Overnight Issues:   Objective:  Vital signs for last 24 hours: Temp:  [99.4 F (37.4 C)-100.9 F (38.3 C)] 99.4 F (37.4 C) (11/28 0400) Pulse Rate:  [93-116] 116 (11/28 0800) Resp:  [16-29] 29 (11/28 0800) BP: (120-154)/(67-85) 154/79 (11/28 0800) SpO2:  [90 %-99 %] 93 % (11/28 0800) FiO2 (%):  [50 %-70 %] 50 % (11/28 0800) Weight:  [80.1 kg (176 lb 9.4 oz)] 80.1  kg (176 lb 9.4 oz) (11/28 0500)  Hemodynamic parameters for last 24 hours: CVP:  [5 mmHg-17 mmHg] 13 mmHg  Intake/Output from previous day: 11/27 0701 - 11/28 0700 In: 2366.3 [I.V.:706.3; NG/GT:1320; IV Piggyback:100] Out: 2850 [Urine:2650; Emesis/NG output:200]  Intake/Output this shift: Total I/O In: 95 [I.V.:30; Other:10; NG/GT:55] Out: 75 [Urine:75]  Vent settings for last 24 hours: Vent Mode: PRVC FiO2 (%):  [50 %-70 %] 50 % Set Rate:  [26 bmp] 26 bmp Vt Set:  [600 mL] 600 mL PEEP:  [5 cmH20] 5 cmH20 Plateau Pressure:  [25 ZOX09-60 cmH20] 25 cmH20  Physical Exam:  General: sedated on the vent, opens eyes to verbal stimulation Neuro: opens eyes, did not follow commands for me Resp: diminished breath sounds bilaterally and rales bibasilar CVS: mildly tachycardic, regular rhythm, distal pulses all 2+ BL GI: soft, nontender, BS WNL, no r/Sheppard Skin: no rash Extremities: 2+ edema in hands BL, no LE edema BL, no gross deformities of extremities  Results for orders placed or performed during the hospital encounter of 08/30/17 (from the past 24 hour(s))  Glucose, capillary     Status: Abnormal   Collection Time: 09/06/17  1:03 PM  Result Value Ref Range    Glucose-Capillary 157 (H) 65 - 99 mg/dL   Comment 1 Notify RN    Comment 2 Document in Chart   Glucose, capillary     Status: Abnormal   Collection Time: 09/06/17  3:38 PM  Result Value Ref Range   Glucose-Capillary 130 (H) 65 - 99 mg/dL   Comment 1 Notify RN    Comment 2 Document in Chart   Glucose, capillary     Status: Abnormal   Collection Time: 09/06/17  8:11 PM  Result Value Ref Range   Glucose-Capillary 133 (H) 65 - 99 mg/dL  Glucose, capillary     Status: Abnormal   Collection Time: 09/06/17 11:36 PM  Result Value Ref Range   Glucose-Capillary 130 (H) 65 - 99 mg/dL  Glucose, capillary     Status: Abnormal   Collection Time: 09/07/17  3:58 AM  Result Value Ref Range   Glucose-Capillary 131 (H) 65 - 99 mg/dL  Glucose, capillary     Status: Abnormal   Collection Time: 09/07/17  8:28 AM  Result Value Ref Range   Glucose-Capillary 139 (H) 65 - 99 mg/dL   Comment 1 Notify RN    Comment 2 Document in Chart     Assessment & Plan: Present on Admission: . Multiple rib fractures Fall from ladder 10 feet Multiple right rib fractures.-multimodal pain control, epidural Right pneumothorax-pigtail removed 11/27 - CXR this AM shows some pulmonary edema with BL pleural effusion - 40 lasix - recheck CXR tomorrow AM, check ABG VDRF- continue vent support COPD and tobacco abuse.-Continue Spiriva and Dulera; Duonebs. Guaifenesin prn for thick secretions  FEN - Con't TFs at goal. Add colace. VTE - SCDs ID - ceftriaxone (11/25>>)  Dispo: ICU. Continue supportive measures    LOS: 7 days   Additional comments:I reviewed the patients new imaging test results. CXR shows pulm edema  and I discussed the patient's overall care with Dr. Hulen Skains.  Critical Care Total Time*: 30 Minutes  Brigid Re , [facility] Surgery 09/07/2017, 9:02 AM Pager: [phone] Mon-Fri 7:00 am-4:30 pm Sat-Sun 7:00 am-11:30 am  09/07/2017  *Care during the described time interval was  provided by me. I have reviewed this patient's available data, including medical history, events of note, physical examination and test results as  part of my evaluation.

## 2017-09-08 ENCOUNTER — Inpatient Hospital Stay (HOSPITAL_COMMUNITY): Payer: 59

## 2017-09-08 LAB — CBC
HEMATOCRIT: 30 % — AB (ref 39.0–52.0)
HEMOGLOBIN: 9.1 g/dL — AB (ref 13.0–17.0)
MCH: 29.4 pg (ref 26.0–34.0)
MCHC: 30.3 g/dL (ref 30.0–36.0)
MCV: 96.8 fL (ref 78.0–100.0)
Platelets: 307 10*3/uL (ref 150–400)
RBC: 3.1 MIL/uL — ABNORMAL LOW (ref 4.22–5.81)
RDW: 18.5 % — AB (ref 11.5–15.5)
WBC: 10.1 10*3/uL (ref 4.0–10.5)

## 2017-09-08 LAB — BASIC METABOLIC PANEL
Anion gap: 7 (ref 5–15)
BUN: 29 mg/dL — AB (ref 6–20)
CALCIUM: 8.2 mg/dL — AB (ref 8.9–10.3)
CO2: 29 mmol/L (ref 22–32)
CREATININE: 0.84 mg/dL (ref 0.61–1.24)
Chloride: 110 mmol/L (ref 101–111)
GFR calc Af Amer: >60 mL/min (ref >60)
GLUCOSE: 123 mg/dL — AB (ref 65–99)
Potassium: 4.2 mmol/L (ref 3.5–5.1)
Sodium: 146 mmol/L — ABNORMAL HIGH (ref 135–145)

## 2017-09-08 LAB — BLOOD GAS, ARTERIAL
ACID-BASE EXCESS: 6.4 mmol/L — AB (ref 0.0–2.0)
Bicarbonate: 30.2 mmol/L — ABNORMAL HIGH (ref 20.0–28.0)
DRAWN BY: 441351
FIO2: 40
MECHVT: 600 mL
O2 SAT: 95.6 %
PATIENT TEMPERATURE: 98.6
PCO2 ART: 42.4 mmHg (ref 32.0–48.0)
PEEP/CPAP: 5 cmH2O
PH ART: 7.466 — AB (ref 7.350–7.450)
PO2 ART: 78.2 mmHg — AB (ref 83.0–108.0)
RATE: 26 resp/min

## 2017-09-08 LAB — GLUCOSE, CAPILLARY
GLUCOSE-CAPILLARY: 139 mg/dL — AB (ref 65–99)
GLUCOSE-CAPILLARY: 131 mg/dL — AB (ref 65–99)
Glucose-Capillary: 112 mg/dL — ABNORMAL HIGH (ref 65–99)
Glucose-Capillary: 114 mg/dL — ABNORMAL HIGH (ref 65–99)
Glucose-Capillary: 118 mg/dL — ABNORMAL HIGH (ref 65–99)
Glucose-Capillary: 124 mg/dL — ABNORMAL HIGH (ref 65–99)

## 2017-09-08 MED ORDER — BETHANECHOL CHLORIDE 25 MG PO TABS
25.0000 mg | ORAL_TABLET | Freq: Three times a day (TID) | ORAL | Status: DC
  Administered 2017-09-08 – 2017-09-17 (×25): 25 mg
  Filled 2017-09-08 (×6): qty 3
  Filled 2017-09-08: qty 1
  Filled 2017-09-08: qty 3
  Filled 2017-09-08 (×2): qty 1
  Filled 2017-09-08 (×2): qty 3
  Filled 2017-09-08: qty 1
  Filled 2017-09-08 (×6): qty 3
  Filled 2017-09-08: qty 1
  Filled 2017-09-08 (×2): qty 3
  Filled 2017-09-08: qty 1
  Filled 2017-09-08: qty 3
  Filled 2017-09-08: qty 1
  Filled 2017-09-08: qty 3

## 2017-09-08 MED ORDER — IBUPROFEN 100 MG/5ML PO SUSP
400.0000 mg | Freq: Four times a day (QID) | ORAL | Status: DC | PRN
  Administered 2017-09-08: 400 mg
  Filled 2017-09-08 (×2): qty 20

## 2017-09-08 MED ORDER — FUROSEMIDE 10 MG/ML IJ SOLN
20.0000 mg | Freq: Once | INTRAMUSCULAR | Status: AC
  Administered 2017-09-08: 20 mg via INTRAVENOUS
  Filled 2017-09-08: qty 2

## 2017-09-08 NOTE — Evaluation (Addendum)
Physical Therapy Evaluation Patient Details Name: Derrick Sheppard MRN: [id number] DOB: Aug 16, 1956 Today's Date: 09/08/2017   History of Present Illness  61 yo admitted after fall from ladder with right rib fx, PTX, intubated 11/23 and chest tube placed, epidural cath placed 11/27. PMHx: COPD, CAD, GERD  Clinical Impression  Pt with flat affect and responding with head nods at times throughout session. Pt with above deficits who remains medically complex on vent with increased time in bed leading to generalized weakness, decreased balance, transfers and function with limited command following with all extremities. Pt will benefit from acute therapy to maximize mobility, strength, function and balance to decrease burden of care and maximize quality of life.   140/89 92 HR 92-96% on CPAP throughout    Follow Up Recommendations CIR;Supervision/Assistance - 24 hour(pending pulmonary status)    Equipment Recommendations  Other (comment)(TBD with progression)    Recommendations for Other Services OT consult     Precautions / Restrictions Precautions Precautions: Fall Precaution Comments: vent, ETT, epidural cath, rectal pouch, Cortrak Restrictions Weight Bearing Restrictions: No      Mobility  Bed Mobility Overal bed mobility: Needs Assistance Bed Mobility: Rolling;Sidelying to Sit;Supine to Sit Rolling: Mod assist Sidelying to sit: Max assist;+2 for physical assistance;+2 for safety/equipment Supine to sit: Max assist;+2 for physical assistance;+2 for safety/equipment     General bed mobility comments: cues for sequence with assist to roll bil and pt bending knee and partially reaching to assist with rolling. Assist to elevate trunk and bring legs off of surface as well as to return legs and trunk to surface  Transfers Overall transfer level: Needs assistance   Transfers: Sit to/from Stand Sit to Stand: +2 physical assistance;Max assist         General transfer  comment: max assist with bil knees blocked to provide anterior translation and rise from surface with ability to stand and partially side step toward HOB x 2 trials  Ambulation/Gait             General Gait Details: unable  Stairs            Wheelchair Mobility    Modified Rankin (Stroke Patients Only)       Balance Overall balance assessment: Needs assistance   Sitting balance-Leahy Scale: Poor Sitting balance - Comments: pt able to maintain minguard sitting balance grossly 30 sec at a time with varied periods up to mod assist for balance with 7 min EOB     Standing balance-Leahy Scale: Zero                               Pertinent Vitals/Pain Pain Assessment: (CPOT= 2, grimace with RUE movement)    Home Living Family/patient expects to be discharged to:: Private residence Living Arrangements: Spouse/significant other               Additional Comments: pt unable to provide PLOF or home setup but able to nod yes to basic questions    Prior Function Level of Independence: Independent               Hand Dominance        Extremity/Trunk Assessment   Upper Extremity Assessment Upper Extremity Assessment: Generalized weakness(pt not moving or lifting bil UE on command, will move sporadically and partially assisting with sitting balance with bil UE)    Lower Extremity Assessment Lower Extremity Assessment: Generalized weakness(grossly at least 2/5 pt assisting  limited function with sitting EOB LAQ but unable to sustain effort for reps)       Communication   Communication: Other (comment)(ETT, vent)  Cognition Arousal/Alertness: Awake/alert Behavior During Therapy: WFL for tasks assessed/performed Overall Cognitive Status: Difficult to assess                                        General Comments      Exercises     Assessment/Plan    PT Assessment Patient needs continued PT services  PT Problem List  Decreased strength;Decreased mobility;Decreased safety awareness;Decreased activity tolerance;Decreased balance;Decreased coordination;Cardiopulmonary status limiting activity;Decreased knowledge of use of DME       PT Treatment Interventions Gait training;Therapeutic exercise;Patient/family education;Balance training;Functional mobility training;Therapeutic activities;DME instruction    PT Goals (Current goals can be found in the Care Plan section)  Acute Rehab PT Goals PT Goal Formulation: Patient unable to participate in goal setting Time For Goal Achievement: 09/22/17 Potential to Achieve Goals: Fair    Frequency Min 3X/week   Barriers to discharge Decreased caregiver support;Inaccessible home environment unsure of home setup or what assist can be provided    Co-evaluation               AM-PAC PT "6 Clicks" Daily Activity  Outcome Measure Difficulty turning over in bed (including adjusting bedclothes, sheets and blankets)?: Unable Difficulty moving from lying on back to sitting on the side of the bed? : Unable Difficulty sitting down on and standing up from a chair with arms (e.g., wheelchair, bedside commode, etc,.)?: Unable Help needed moving to and from a bed to chair (including a wheelchair)?: Total Help needed walking in hospital room?: Total Help needed climbing 3-5 steps with a railing? : Total 6 Click Score: 6    End of Session   Activity Tolerance: Patient tolerated treatment well Patient left: in bed;with call bell/phone within reach;with nursing/sitter in room;with bed alarm set Nurse Communication: Mobility status PT Visit Diagnosis: Other abnormalities of gait and mobility (R26.89);Difficulty in walking, not elsewhere classified (R26.2);Muscle weakness (generalized) (M62.81)    Time: 4166-0630 PT Time Calculation (min) (ACUTE ONLY): 19 min   Charges:   PT Evaluation $PT Eval High Complexity: 1 High     PT G Codes:        Elwyn Reach,  PT [phone]    B Abryanna Musolino 09/08/2017, 10:23 AM

## 2017-09-08 NOTE — Anesthesia Post-op Follow-up Note (Signed)
  Anesthesia Pain Follow-up Note  Patient: Derrick Sheppard  Day #: 3  Date of Follow-up: 09/08/2017 Time: 5:47 PM  Last Vitals:  Vitals:   09/08/17 1615 09/08/17 1700  BP: 133/75 132/75  Pulse: 94 93  Resp: 15 16  Temp:    SpO2: 95% 96%    Level of Consciousness: alert, wife states patient is very much more alert, and very much more comfortable  Pain: mild   Side Effects:None  Catheter Site Exam:clean, dry, no drainage  Anti-Coag Meds (From admission, onward)   Start     Dose/Rate Route Frequency Ordered Stop   09/07/17 1800  enoxaparin (LOVENOX) injection 40 mg     40 mg Subcutaneous Every 24 hours 09/07/17 1614      Epidural / Intrathecal (From admission, onward)   Start     Dose/Rate Route Frequency Ordered Stop   09/05/17 2015  ropivacaine (PF) 2 mg/mL (0.2%) (NAROPIN) injection     10 mL/hr 10 mL/hr  Epidural Continuous 09/05/17 2002 09/09/17 2014       Plan: Continue current therapy of postop epidural at surgeon's request, surgeons continuing to wean vent  Markell Schrier,E. Murel Shenberger

## 2017-09-08 NOTE — Progress Notes (Signed)
Nutrition Follow-up  INTERVENTION:   Continue:  Pivot 1.5 @ 55 ml/hr  Provides: 1980 kcal (105% of needs), 123 grams protein, and 1001 ml free water.   Recommend: 250 ml free water every 6 hours Total free water: 2001 ml   NUTRITION DIAGNOSIS:   Increased nutrient needs related to wound healing as evidenced by estimated needs. Ongoing.   GOAL:   Patient will meet greater than or equal to 90% of their needs Met.   MONITOR:   I & O's, TF tolerance  ASSESSMENT:   Pt with PMH of COPD, CAD, and GERD who was admitted to [facility] 11/20 after fall from 10 ft ladder. He was found to have multiple right sided rib fractures and a small pneumothorax. He was admitted to floor by trauma service for monitoring. He developed hypoxemia was unable to use IS due to pain. He was transferred to SDU for BiPAP. Then developed lethargy. CT scan of the chest demonstrated worsening PTX on 11/23 and chest tube was placed. He was transferred to ICU and intubated upon arrival.  Pt discussed during ICU rounds and with RN.  Weight is up 10 lb, pt is positive 5 L with moderate edema Pt with epidural for pain  Patient is currently intubated on ventilator support MV: 12.4 L/min Temp (24hrs), Avg:99.7 F (37.6 C), Min:98.4 F (36.9 C), Max:101.8 F (38.8 C)  Medications reviewed and include: colace, miralax 1/2 NS  @ 10 ml/hr Labs reviewed: Na 146 (H) CBG's: 118-112    Diet Order:  Diet NPO time specified  EDUCATION NEEDS:   No education needs have been identified at this time  Skin:  Skin Assessment: Reviewed RN Assessment  Last BM:  11/29 rectal pouch with 400 ml out  Height:   Ht Readings from Last 1 Encounters:  09/02/17 _0  (1.702 m)    Weight:   Wt Readings from Last 1 Encounters:  09/08/17 175 lb 7.8 oz (79.6 kg)    Ideal Body Weight:  67.2 kg  BMI:  Body mass index is 27.49 kg/m.  Estimated Nutritional Needs:   Kcal:  1886  Protein:  100-120 grams  Fluid:  >  1.8 L/day  Maylon Peppers RD, LDN, CNSC [phone] Pager [phone] After Hours Pager

## 2017-09-08 NOTE — Progress Notes (Signed)
Inpatient Rehabilitation  Per PT request, patient was screened by Gunnar Fusi for appropriateness for an Inpatient Acute Rehab consult.  At this time we are planning to follow along for vent weaning prior recommending an Inpatient Rehab consult.  Call if questions.   Carmelia Roller., CCC/SLP Admission Coordinator  [place]  Cell [phone]

## 2017-09-08 NOTE — Addendum Note (Signed)
Addendum  created 09/08/17 1749 by Annye Asa, MD   Sign clinical note

## 2017-09-08 NOTE — Progress Notes (Signed)
Follow up - Trauma Critical Care  Patient Details:    Derrick Sheppard is an 61 y.o. male.  Lines/tubes : Airway 7.5 mm (Active)  Secured at (cm) 25 cm 09/08/2017  4:12 AM  Measured From Lips 09/08/2017  4:12 AM  Secured Location Right 09/08/2017  4:12 AM  Secured By [employer] 09/08/2017  4:12 AM  Tube Holder Repositioned Yes 09/08/2017  4:12 AM  Cuff Pressure (cm H2O) 22 cm H2O 09/07/2017  7:28 AM  Site Condition Dry 09/08/2017  4:12 AM     CVC Triple Lumen 09/02/17 Right Subclavian (Active)  Indication for Insertion or Continuance of Line Prolonged intravenous therapies 09/08/2017  7:49 AM  Site Assessment Clean;Dry;Intact 09/07/2017  8:00 PM  Proximal Lumen Status Infusing 09/07/2017  8:00 PM  Medial Lumen Status Infusing 09/07/2017  8:00 PM  Distal Lumen Status In-line blood sampling system in place 09/07/2017  8:00 PM  Dressing Type Transparent;Occlusive 09/07/2017  8:00 PM  Dressing Status Clean;Dry;Intact;Antimicrobial disc in place 09/07/2017  8:00 PM  Line Care Connections checked and tightened;Zeroed and calibrated 09/07/2017  8:00 PM  Dressing Change Due 09/09/17 09/07/2017  8:00 PM     Epidural Catheter 09/05/17 (Active)  Site Assessment Clean;Dry;Intact 09/07/2017 10:00 PM  Line Status Infusing 09/07/2017 10:00 PM  Dressing Status Clean;Dry;Intact 09/07/2017 10:00 PM  Dressing Change Due 09/12/17 09/07/2017 10:00 PM     NG/OG Tube Orogastric Center mouth Xray (Active)  External Length of Tube (cm) - (if applicable) 44 cm 30/04/6225  8:00 AM  Site Assessment Clean;Intact 09/06/2017  8:00 PM  Ongoing Placement Verification No change in cm markings or external length of tube from initial placement;No change in respiratory status;No acute changes, not attributed to clinical condition;Xray 09/06/2017  8:00 PM  Status Infusing tube feed 09/06/2017  8:00 PM  Drainage Appearance Green 09/05/2017  8:00 PM  Output (mL) 200 mL 09/06/2017  2:00 PM     Urethral  Catheter [name] (Active)  Indication for Insertion or Continuance of Catheter Acute urinary retention 09/08/2017  7:49 AM  Site Assessment Clean;Intact;Dry 09/07/2017  8:00 AM  Catheter Maintenance Bag below level of bladder;Drainage bag/tubing not touching floor;Catheter secured;Insertion date on drainage bag;No dependent loops;Seal intact;Bag emptied prior to transport 09/08/2017  7:49 AM  Collection Container Standard drainage bag 09/07/2017  8:00 AM  Securement Method Leg strap 09/07/2017  8:00 AM  Urinary Catheter Interventions Unclamped 09/07/2017  8:00 AM  Output (mL) 800 mL 09/08/2017  4:00 AM    Microbiology/Sepsis markers: Results for orders placed or performed during the hospital encounter of 08/30/17  MRSA PCR Screening     Status: None   Collection Time: 08/31/17  9:05 PM  Result Value Ref Range Status   MRSA by PCR NEGATIVE NEGATIVE Final    Comment:        The GeneXpert MRSA Assay (FDA approved for NASAL specimens only), is one component of a comprehensive MRSA colonization surveillance program. It is not intended to diagnose MRSA infection nor to guide or monitor treatment for MRSA infections.   Culture, respiratory (NON-Expectorated)     Status: None   Collection Time: 09/02/17 11:14 AM  Result Value Ref Range Status   Specimen Description TRACHEAL ASPIRATE  Final   Special Requests NONE  Final   Gram Stain   Final    MODERATE WBC PRESENT, PREDOMINANTLY PMN RARE SQUAMOUS EPITHELIAL CELLS PRESENT ABUNDANT GRAM POSITIVE COCCI IN PAIRS IN CHAINS    Culture Consistent with normal respiratory flora.  Final   Report Status 09/05/2017 FINAL  Final  Culture, blood (Routine X 2) w Reflex to ID Panel     Status: None   Collection Time: 09/02/17 11:30 AM  Result Value Ref Range Status   Specimen Description BLOOD RIGHT ANTECUBITAL  Final   Special Requests IN PEDIATRIC BOTTLE Blood Culture adequate volume  Final   Culture NO GROWTH 5 DAYS  Final   Report Status  09/07/2017 FINAL  Final  Culture, blood (Routine X 2) w Reflex to ID Panel     Status: None   Collection Time: 09/02/17 11:34 AM  Result Value Ref Range Status   Specimen Description BLOOD RIGHT ANTECUBITAL  Final   Special Requests IN PEDIATRIC BOTTLE Blood Culture adequate volume  Final   Culture NO GROWTH 5 DAYS  Final   Report Status 09/07/2017 FINAL  Final  Culture, respiratory (NON-Expectorated)     Status: None   Collection Time: 09/05/17 11:38 AM  Result Value Ref Range Status   Specimen Description TRACHEAL ASPIRATE  Final   Special Requests Normal  Final   Gram Stain   Final    FEW WBC PRESENT, PREDOMINANTLY MONONUCLEAR NO SQUAMOUS EPITHELIAL CELLS SEEN FEW YEAST WITH PSEUDOHYPHAE RARE GRAM POSITIVE COCCI IN PAIRS IN CLUSTERS    Culture FEW CANDIDA TROPICALIS  Final   Report Status 09/07/2017 FINAL  Final    Anti-infectives:  Anti-infectives (From admission, onward)   Start     Dose/Rate Route Frequency Ordered Stop   09/04/17 1400  cefTRIAXone (ROCEPHIN) 2 g in dextrose 5 % 50 mL IVPB     2 g 100 mL/hr over 30 Minutes Intravenous Every 24 hours 09/04/17 0857     09/04/17 0900  cefTRIAXone (ROCEPHIN) 1 g in dextrose 5 % 50 mL IVPB  Status:  Discontinued     1 g 100 mL/hr over 30 Minutes Intravenous Every 24 hours 09/04/17 0851 09/04/17 0857   09/03/17 1400  cefTAZidime (FORTAZ) 2 g in dextrose 5 % 50 mL IVPB  Status:  Discontinued     2 g 100 mL/hr over 30 Minutes Intravenous Every 8 hours 09/03/17 1309 09/04/17 0851   09/02/17 1400  cefTAZidime (FORTAZ) 2 g in dextrose 5 % 50 mL IVPB  Status:  Discontinued     2 g 100 mL/hr over 30 Minutes Intravenous Every 12 hours 09/02/17 1122 09/03/17 1309   09/02/17 0430  vancomycin (VANCOCIN) IVPB 1000 mg/200 mL premix  Status:  Discontinued     1,000 mg 200 mL/hr over 60 Minutes Intravenous Every 12 hours 09/02/17 0418 09/04/17 0851   09/02/17 0430  ceFEPIme (MAXIPIME) 1 g in dextrose 5 % 50 mL IVPB  Status:  Discontinued      1 g 100 mL/hr over 30 Minutes Intravenous Every 8 hours 09/02/17 0418 09/02/17 1111      Best Practice/Protocols:  VTE Prophylaxis: Lovenox (prophylaxtic dose) Continous Sedation   Subjective:    Overnight Issues:   Objective:  Vital signs for last 24 hours: Temp:  [98.4 F (36.9 C)-100.7 F (38.2 C)] 100.6 F (38.1 C) (11/29 0400) Pulse Rate:  [90-124] 95 (11/29 0700) Resp:  [15-29] 26 (11/29 0700) BP: (110-164)/(63-88) 110/66 (11/29 0700) SpO2:  [91 %-97 %] 94 % (11/29 0700) FiO2 (%):  [40 %-50 %] 40 % (11/29 0412) Weight:  [79.6 kg (175 lb 7.8 oz)] 79.6 kg (175 lb 7.8 oz) (11/29 0359)  Hemodynamic parameters for last 24 hours: CVP:  [4 mmHg-23 mmHg] 9 mmHg  Intake/Output  from previous day: 11/28 0701 - 11/29 0700 In: 2194.6 [I.V.:699.6; NG/GT:1265] Out: 3920 [Urine:3920]  Intake/Output this shift: No intake/output data recorded.  Vent settings for last 24 hours: Vent Mode: PRVC FiO2 (%):  [40 %-50 %] 40 % Set Rate:  [26 bmp] 26 bmp Vt Set:  [600 mL] 600 mL PEEP:  [5 cmH20] 5 cmH20 Pressure Support:  [10 cmH20] 10 cmH20 Plateau Pressure:  [26 cmH20-30 cmH20] 30 cmH20  Physical Exam:  General: on vent Neuro: sedated HEENT/Neck: ETT Resp: clear to auscultation bilaterally CVS: RRR GI: distended but soft, +BS, NT Extremities: edema 1+  Results for orders placed or performed during the hospital encounter of 08/30/17 (from the past 24 hour(s))  Glucose, capillary     Status: Abnormal   Collection Time: 09/07/17  8:28 AM  Result Value Ref Range   Glucose-Capillary 139 (H) 65 - 99 mg/dL   Comment 1 Notify RN    Comment 2 Document in Chart   CBC     Status: Abnormal   Collection Time: 09/07/17  9:21 AM  Result Value Ref Range   WBC 10.0 4.0 - 10.5 K/uL   RBC 3.10 (L) 4.22 - 5.81 MIL/uL   Hemoglobin 8.9 (L) 13.0 - 17.0 g/dL   HCT 28.3 (L) 39.0 - 52.0 %   MCV 91.3 78.0 - 100.0 fL   MCH 28.7 26.0 - 34.0 pg   MCHC 31.4 30.0 - 36.0 g/dL   RDW 15.2 11.5  - 15.5 %   Platelets 271 150 - 400 K/uL  Basic metabolic panel     Status: Abnormal   Collection Time: 09/07/17  9:21 AM  Result Value Ref Range   Sodium 145 135 - 145 mmol/L   Potassium 4.4 3.5 - 5.1 mmol/L   Chloride 112 (H) 101 - 111 mmol/L   CO2 29 22 - 32 mmol/L   Glucose, Bld 153 (H) 65 - 99 mg/dL   BUN 29 (H) 6 - 20 mg/dL   Creatinine, Ser 0.84 0.61 - 1.24 mg/dL   Calcium 8.5 (L) 8.9 - 10.3 mg/dL   GFR calc non Af Amer >60 >60 mL/min   GFR calc Af Amer >60 >60 mL/min   Anion gap 4 (L) 5 - 15  Blood gas, arterial     Status: Abnormal   Collection Time: 09/07/17 11:50 AM  Result Value Ref Range   FIO2 40.00    Delivery systems VENTILATOR    Mode PRESSURE REGULATED VOLUME CONTROL    VT 600 mL   LHR 26 resp/min   Peep/cpap 5.0 cm H20   pH, Arterial 7.479 (H) 7.350 - 7.450   pCO2 arterial 43.1 32.0 - 48.0 mmHg   pO2, Arterial 65.6 (L) 83.0 - 108.0 mmHg   Bicarbonate 31.3 (H) 20.0 - 28.0 mmol/L   Acid-Base Excess 7.7 (H) 0.0 - 2.0 mmol/L   O2 Saturation 92.4 %   Patient temperature 100.7    Collection site LEFT RADIAL    Drawn by [id number]    Sample type ARTERIAL DRAW    Allens test (pass/fail) PASS PASS  Glucose, capillary     Status: Abnormal   Collection Time: 09/07/17 12:11 PM  Result Value Ref Range   Glucose-Capillary 126 (H) 65 - 99 mg/dL   Comment 1 Notify RN    Comment 2 Document in Chart   Glucose, capillary     Status: Abnormal   Collection Time: 09/07/17  4:36 PM  Result Value Ref Range   Glucose-Capillary 125 (H)  65 - 99 mg/dL   Comment 1 Notify RN    Comment 2 Document in Chart   Glucose, capillary     Status: Abnormal   Collection Time: 09/07/17  8:18 PM  Result Value Ref Range   Glucose-Capillary 126 (H) 65 - 99 mg/dL  Glucose, capillary     Status: Abnormal   Collection Time: 09/07/17 11:51 PM  Result Value Ref Range   Glucose-Capillary 139 (H) 65 - 99 mg/dL  CBC     Status: Abnormal   Collection Time: 09/08/17  3:45 AM  Result Value Ref Range    WBC 10.1 4.0 - 10.5 K/uL   RBC 3.10 (L) 4.22 - 5.81 MIL/uL   Hemoglobin 9.1 (L) 13.0 - 17.0 g/dL   HCT 30.0 (L) 39.0 - 52.0 %   MCV 96.8 78.0 - 100.0 fL   MCH 29.4 26.0 - 34.0 pg   MCHC 30.3 30.0 - 36.0 g/dL   RDW 18.5 (H) 11.5 - 15.5 %   Platelets 307 150 - 400 K/uL  Glucose, capillary     Status: Abnormal   Collection Time: 09/08/17  3:49 AM  Result Value Ref Range   Glucose-Capillary 139 (H) 65 - 99 mg/dL  Blood gas, arterial     Status: Abnormal   Collection Time: 09/08/17  4:20 AM  Result Value Ref Range   FIO2 40.00    Delivery systems VENTILATOR    Mode PRESSURE REGULATED VOLUME CONTROL    VT 600 mL   LHR 26.0 resp/min   Peep/cpap 5.0 cm H20   pH, Arterial 7.466 (H) 7.350 - 7.450   pCO2 arterial 42.4 32.0 - 48.0 mmHg   pO2, Arterial 78.2 (L) 83.0 - 108.0 mmHg   Bicarbonate 30.2 (H) 20.0 - 28.0 mmol/L   Acid-Base Excess 6.4 (H) 0.0 - 2.0 mmol/L   O2 Saturation 95.6 %   Patient temperature 98.6    Collection site RIGHT RADIAL    Drawn by [id number]    Sample type ARTERIAL DRAW    Allens test (pass/fail) PASS PASS  Basic metabolic panel     Status: Abnormal   Collection Time: 09/08/17  6:41 AM  Result Value Ref Range   Sodium 146 (H) 135 - 145 mmol/L   Potassium 4.2 3.5 - 5.1 mmol/L   Chloride 110 101 - 111 mmol/L   CO2 29 22 - 32 mmol/L   Glucose, Bld 123 (H) 65 - 99 mg/dL   BUN 29 (H) 6 - 20 mg/dL   Creatinine, Ser 0.84 0.61 - 1.24 mg/dL   Calcium 8.2 (L) 8.9 - 10.3 mg/dL   GFR calc non Af Amer >60 >60 mL/min   GFR calc Af Amer >60 >60 mL/min   Anion gap 7 5 - 15  Glucose, capillary     Status: Abnormal   Collection Time: 09/08/17  7:30 AM  Result Value Ref Range   Glucose-Capillary 118 (H) 65 - 99 mg/dL    Assessment & Plan: Present on Admission: . Multiple rib fractures    LOS: 8 days   Additional comments:I reviewed the patient's new clinical lab test results. . Fall from ladder 10 feet Multiple right rib fractures.-multimodal pain control,  epidural Right pneumothorax-pigtail removed 11/27 - CXR in AM VDRF- continue weaning as able, Lasix X 1 COPD and tobacco abuse.-Continue Spiriva and Dulera; Duonebs. Guaifenesin prn for thick secretions FEN - Con't TFs at goal. Bowel function improved VTE - lovenox, will need ot hold for 12h prior to removal of  epidural ID - ceftriaxone (11/25>>) empiric for HCAP, resp CX so far just yeast but still febrile.  Acute urinary retention - foley replaced, add urecholine Dispo: ICU.  Critical Care Total Time*: 32 Minutes  Georganna Skeans, MD, MPH, [facility] Trauma: [phone] General Surgery: [phone]  09/08/2017  *Care during the described time interval was provided by me. I have reviewed this patient's available data, including medical history, events of note, physical examination and test results as part of my evaluation.  Patient ID: Derrick Sheppard, male   DOB: 01-20-1956, 61 y.o.   MRN: [id number]

## 2017-09-09 ENCOUNTER — Inpatient Hospital Stay (HOSPITAL_COMMUNITY): Payer: 59 | Admitting: Certified Registered"

## 2017-09-09 ENCOUNTER — Inpatient Hospital Stay (HOSPITAL_COMMUNITY): Payer: 59

## 2017-09-09 LAB — BASIC METABOLIC PANEL
ANION GAP: 6 (ref 5–15)
BUN: 30 mg/dL — ABNORMAL HIGH (ref 6–20)
CALCIUM: 8.1 mg/dL — AB (ref 8.9–10.3)
CHLORIDE: 109 mmol/L (ref 101–111)
CO2: 30 mmol/L (ref 22–32)
Creatinine, Ser: 0.84 mg/dL (ref 0.61–1.24)
GFR calc non Af Amer: >60 mL/min (ref >60)
GLUCOSE: 136 mg/dL — AB (ref 65–99)
Potassium: 4 mmol/L (ref 3.5–5.1)
Sodium: 145 mmol/L (ref 135–145)

## 2017-09-09 LAB — GLUCOSE, CAPILLARY
GLUCOSE-CAPILLARY: 128 mg/dL — AB (ref 65–99)
GLUCOSE-CAPILLARY: 135 mg/dL — AB (ref 65–99)
GLUCOSE-CAPILLARY: 135 mg/dL — AB (ref 65–99)
Glucose-Capillary: 132 mg/dL — ABNORMAL HIGH (ref 65–99)
Glucose-Capillary: 143 mg/dL — ABNORMAL HIGH (ref 65–99)
Glucose-Capillary: 173 mg/dL — ABNORMAL HIGH (ref 65–99)

## 2017-09-09 LAB — CBC
HEMATOCRIT: 29.1 % — AB (ref 39.0–52.0)
HEMOGLOBIN: 9.1 g/dL — AB (ref 13.0–17.0)
MCH: 29.3 pg (ref 26.0–34.0)
MCHC: 31.3 g/dL (ref 30.0–36.0)
MCV: 93.6 fL (ref 78.0–100.0)
Platelets: 349 10*3/uL (ref 150–400)
RBC: 3.11 MIL/uL — ABNORMAL LOW (ref 4.22–5.81)
RDW: 15.3 % (ref 11.5–15.5)
WBC: 11.6 10*3/uL — AB (ref 4.0–10.5)

## 2017-09-09 MED ORDER — RACEPINEPHRINE HCL 2.25 % IN NEBU
0.5000 mL | INHALATION_SOLUTION | Freq: Once | RESPIRATORY_TRACT | Status: AC
  Administered 2017-09-09: 0.5 mL via RESPIRATORY_TRACT

## 2017-09-09 MED ORDER — LORAZEPAM 2 MG/ML IJ SOLN
INTRAMUSCULAR | Status: AC
  Filled 2017-09-09: qty 1

## 2017-09-09 MED ORDER — FENTANYL CITRATE (PF) 100 MCG/2ML IJ SOLN
50.0000 ug | INTRAMUSCULAR | Status: DC | PRN
  Administered 2017-09-13 (×2): 100 ug via INTRAVENOUS
  Administered 2017-09-13: 50 ug via INTRAVENOUS
  Administered 2017-09-14 – 2017-09-16 (×9): 100 ug via INTRAVENOUS
  Administered 2017-09-17: 50 ug via INTRAVENOUS
  Filled 2017-09-09 (×11): qty 2

## 2017-09-09 MED ORDER — FREE WATER: 250.0000 mL | Freq: Four times a day (QID) | Status: DC

## 2017-09-09 MED ORDER — RACEPINEPHRINE HCL 2.25 % IN NEBU
INHALATION_SOLUTION | RESPIRATORY_TRACT | Status: AC
  Administered 2017-09-09: 0.5 mL
  Filled 2017-09-09: qty 0.5

## 2017-09-09 MED ORDER — FENTANYL BOLUS VIA INFUSION: 50.0000 ug | INTRAVENOUS | Status: DC | PRN

## 2017-09-09 MED ORDER — FENTANYL CITRATE (PF) 100 MCG/2ML IJ SOLN
50.0000 ug | Freq: Once | INTRAMUSCULAR | Status: AC
  Administered 2017-09-09: 50 ug via INTRAVENOUS

## 2017-09-09 MED ORDER — FENTANYL 2500MCG IN NS 250ML (10MCG/ML) PREMIX INFUSION
25.0000 ug/h | INTRAVENOUS | Status: DC
Start: 2017-09-09 — End: 2017-09-13
  Administered 2017-09-09: 150 ug/h via INTRAVENOUS
  Administered 2017-09-09: 200 ug/h via INTRAVENOUS
  Administered 2017-09-10 (×3): 300 ug/h via INTRAVENOUS
  Administered 2017-09-11: 125 ug/h via INTRAVENOUS
  Administered 2017-09-12: 200 ug/h via INTRAVENOUS
  Administered 2017-09-12: 100 ug/h via INTRAVENOUS
  Filled 2017-09-09 (×8): qty 250

## 2017-09-09 MED ORDER — PROPOFOL 10 MG/ML IV BOLUS
INTRAVENOUS | Status: DC | PRN
  Administered 2017-09-09: 50 mg via INTRAVENOUS

## 2017-09-09 MED ORDER — METHYLPREDNISOLONE SODIUM SUCC 125 MG IJ SOLR
125.0000 mg | Freq: Two times a day (BID) | INTRAMUSCULAR | Status: DC
  Administered 2017-09-09 – 2017-09-14 (×10): 125 mg via INTRAVENOUS
  Filled 2017-09-09 (×10): qty 2

## 2017-09-09 MED ORDER — FENTANYL BOLUS VIA INFUSION
25.0000 ug | INTRAVENOUS | Status: DC | PRN
  Administered 2017-09-11 – 2017-09-12 (×3): 25 ug via INTRAVENOUS
  Filled 2017-09-09: qty 25

## 2017-09-09 MED ORDER — RACEPINEPHRINE HCL 2.25 % IN NEBU: 0.5000 mL | INHALATION_SOLUTION | Freq: Once | RESPIRATORY_TRACT | Status: DC

## 2017-09-09 MED ORDER — SUCCINYLCHOLINE CHLORIDE 20 MG/ML IJ SOLN
INTRAMUSCULAR | Status: DC | PRN
  Administered 2017-09-09: 100 mg via INTRAVENOUS

## 2017-09-09 MED ORDER — LORAZEPAM 2 MG/ML IJ SOLN
0.5000 mg | Freq: Once | INTRAMUSCULAR | Status: AC
  Administered 2017-09-09: 0.5 mg via INTRAVENOUS

## 2017-09-09 NOTE — Anesthesia Procedure Notes (Signed)
Procedure Name: Intubation Date/Time: 09/09/2017 11:45 AM Performed by: Moshe Salisbury, CRNA Pre-anesthesia Checklist: Patient identified, Emergency Drugs available, Suction available and Patient being monitored Patient Re-evaluated:Patient Re-evaluated prior to induction Oxygen Delivery Method: Circle System Utilized Preoxygenation: Pre-oxygenation with 100% oxygen Induction Type: IV induction Ventilation: Mask ventilation without difficulty Laryngoscope Size: Mac and 4 Grade View: Grade II Tube type: Subglottic suction tube Tube size: 8.0 mm Number of attempts: 1 Airway Equipment and Method: Stylet Placement Confirmation: ETT inserted through vocal cords under direct vision,  positive ETCO2 and breath sounds checked- equal and bilateral Secured at: 23 cm Tube secured with: Tape Dental Injury: Teeth and Oropharynx as per pre-operative assessment

## 2017-09-09 NOTE — Procedures (Signed)
Extubation Procedure Note  Patient Details:   Name: Derrick Sheppard DOB: Oct 26, 1955 MRN: [id number]   Airway Documentation:   Pt extubated per order from Dr Dennis Bast. Pt had positive cuff leak. Placed on 4 lpm nasal cannula. Pt able to voice and is alert and oriented. Order to keep SATs above 88%. Instructed on Incentive Spirometry, reached 1250 x 10 will good effort. Will continue to monitor.   Evaluation  O2 sats: stable throughout Complications: No apparent complications Patient did tolerate procedure well. Bilateral Breath Sounds: Diminished, Rhonchi   Yes  Ander Purpura 09/09/2017, 11:04 AM

## 2017-09-09 NOTE — Progress Notes (Signed)
Patient developed stridor and distress post extubation. Racemic epinephrine given without improvement. Sats were 79% on nonrebreather. Order to re-intubate and anesthesia called. Patient manually bagged with 100% fio2 until intubated. Pt now intubated with a #8.0 and secured at 24 cm at the lip. Pt placed on vent with ordered settings. RN at bedside. Will continue to monitor.

## 2017-09-09 NOTE — Procedures (Signed)
Patient in ICU recently extubated now failing to thrive and in need of reintubation on Trama service. Attended at bedside  ( see note) for reintubation without difficulty

## 2017-09-09 NOTE — Progress Notes (Signed)
177ml of fentanyl wasted in the sink with Tammy B RN

## 2017-09-09 NOTE — Progress Notes (Signed)
Patient ID: Derrick Sheppard, male   DOB: Apr 16, 1956, 61 y.o.   MRN: [id number] Patient developed stridor after extubation that did not respond to racemic epi. Intubated by anesthesia. Will start solumedrol. I will call his wife. Georganna Skeans, MD, MPH, FACS Trauma: [phone] General Surgery: [phone]

## 2017-09-09 NOTE — Progress Notes (Addendum)
Follow up - Trauma Critical Care  Patient Details:    Derrick Sheppard is an 61 y.o. male.  Lines/tubes : Airway 7.5 mm (Active)  Secured at (cm) 25 cm 09/09/2017  3:26 AM  Measured From Lips 09/09/2017  3:26 AM  Secured Location Right 09/09/2017  3:26 AM  Secured By [employer] 09/09/2017  3:26 AM  Tube Holder Repositioned Yes 09/09/2017  3:26 AM  Cuff Pressure (cm H2O) 26 cm H2O 09/09/2017  3:26 AM  Site Condition Dry 09/08/2017  4:15 PM     CVC Triple Lumen 09/02/17 Right Subclavian (Active)  Indication for Insertion or Continuance of Line Prolonged intravenous therapies 09/09/2017  8:00 AM  Site Assessment Clean;Dry;Intact 09/09/2017  8:00 AM  Proximal Lumen Status Saline locked;Flushed 09/09/2017  8:00 AM  Medial Lumen Status Infusing 09/09/2017  8:00 AM  Distal Lumen Status In-line blood sampling system in place 09/09/2017  8:00 AM  Dressing Type Transparent;Occlusive 09/09/2017  8:00 AM  Dressing Status Clean;Dry;Intact;Antimicrobial disc in place 09/09/2017  8:00 AM  Line Care Connections checked and tightened;Zeroed and calibrated 09/09/2017  8:00 AM  Dressing Intervention Dressing changed;Antimicrobial disc changed 09/09/2017  4:00 AM  Dressing Change Due 09/16/17 09/09/2017  8:00 AM     Epidural Catheter 09/05/17 (Active)  Site Assessment Clean;Dry;Intact 09/09/2017  8:00 AM  Line Status Infusing 09/09/2017  8:00 AM  Dressing Status Clean;Dry;Intact 09/09/2017  8:00 AM  Dressing Change Due 09/12/17 09/09/2017  8:00 AM     NG/OG Tube Orogastric Center mouth Xray (Active)  External Length of Tube (cm) - (if applicable) 44 cm 85/46/2703  8:00 AM  Site Assessment Clean;Intact 09/09/2017  8:00 AM  Ongoing Placement Verification No change in cm markings or external length of tube from initial placement;No change in respiratory status;No acute changes, not attributed to clinical condition 09/09/2017  8:00 AM  Status Infusing tube feed;Irrigated 09/09/2017  8:00  AM  Drainage Appearance Green 09/05/2017  8:00 PM  Output (mL) 200 mL 09/06/2017  2:00 PM     Rectal Tube/Pouch (Active)  Output (mL) 100 mL 09/09/2017  4:00 AM     Urethral Catheter Derrick Sheppard (Active)  Indication for Insertion or Continuance of Catheter Acute urinary retention 09/09/2017  8:00 AM  Site Assessment Clean;Intact;Dry 09/09/2017  8:00 AM  Catheter Maintenance Bag below level of bladder;Drainage bag/tubing not touching floor;Catheter secured;Insertion date on drainage bag;No dependent loops;Seal intact;Bag emptied prior to transport 09/09/2017  8:00 AM  Collection Container Standard drainage bag 09/09/2017  8:00 AM  Securement Method Leg strap 09/09/2017  8:00 AM  Urinary Catheter Interventions Unclamped 09/09/2017  8:00 AM  Output (mL) 200 mL 09/09/2017  6:00 AM    Microbiology/Sepsis markers: Results for orders placed or performed during the hospital encounter of 08/30/17  MRSA PCR Screening     Status: None   Collection Time: 08/31/17  9:05 PM  Result Value Ref Range Status   MRSA by PCR NEGATIVE NEGATIVE Final    Comment:        The GeneXpert MRSA Assay (FDA approved for NASAL specimens only), is one component of a comprehensive MRSA colonization surveillance program. It is not intended to diagnose MRSA infection nor to guide or monitor treatment for MRSA infections.   Culture, respiratory (NON-Expectorated)     Status: None   Collection Time: 09/02/17 11:14 AM  Result Value Ref Range Status   Specimen Description TRACHEAL ASPIRATE  Final   Special Requests NONE  Final   Gram Stain  Final    MODERATE WBC PRESENT, PREDOMINANTLY PMN RARE SQUAMOUS EPITHELIAL CELLS PRESENT ABUNDANT GRAM POSITIVE COCCI IN PAIRS IN CHAINS    Culture Consistent with normal respiratory flora.  Final   Report Status 09/05/2017 FINAL  Final  Culture, blood (Routine X 2) w Reflex to ID Panel     Status: None   Collection Time: 09/02/17 11:30 AM  Result Value Ref Range Status    Specimen Description BLOOD RIGHT ANTECUBITAL  Final   Special Requests IN PEDIATRIC BOTTLE Blood Culture adequate volume  Final   Culture NO GROWTH 5 DAYS  Final   Report Status 09/07/2017 FINAL  Final  Culture, blood (Routine X 2) w Reflex to ID Panel     Status: None   Collection Time: 09/02/17 11:34 AM  Result Value Ref Range Status   Specimen Description BLOOD RIGHT ANTECUBITAL  Final   Special Requests IN PEDIATRIC BOTTLE Blood Culture adequate volume  Final   Culture NO GROWTH 5 DAYS  Final   Report Status 09/07/2017 FINAL  Final  Culture, respiratory (NON-Expectorated)     Status: None   Collection Time: 09/05/17 11:38 AM  Result Value Ref Range Status   Specimen Description TRACHEAL ASPIRATE  Final   Special Requests Normal  Final   Gram Stain   Final    FEW WBC PRESENT, PREDOMINANTLY MONONUCLEAR NO SQUAMOUS EPITHELIAL CELLS SEEN FEW YEAST WITH PSEUDOHYPHAE RARE GRAM POSITIVE COCCI IN PAIRS IN CLUSTERS    Culture FEW CANDIDA TROPICALIS  Final   Report Status 09/07/2017 FINAL  Final    Anti-infectives:  Anti-infectives (From admission, onward)   Start     Dose/Rate Route Frequency Ordered Stop   09/04/17 1400  cefTRIAXone (ROCEPHIN) 2 Sheppard in dextrose 5 % 50 mL IVPB     2 Sheppard 100 mL/hr over 30 Minutes Intravenous Every 24 hours 09/04/17 0857     09/04/17 0900  cefTRIAXone (ROCEPHIN) 1 Sheppard in dextrose 5 % 50 mL IVPB  Status:  Discontinued     1 Sheppard 100 mL/hr over 30 Minutes Intravenous Every 24 hours 09/04/17 0851 09/04/17 0857   09/03/17 1400  cefTAZidime (FORTAZ) 2 Sheppard in dextrose 5 % 50 mL IVPB  Status:  Discontinued     2 Sheppard 100 mL/hr over 30 Minutes Intravenous Every 8 hours 09/03/17 1309 09/04/17 0851   09/02/17 1400  cefTAZidime (FORTAZ) 2 Sheppard in dextrose 5 % 50 mL IVPB  Status:  Discontinued     2 Sheppard 100 mL/hr over 30 Minutes Intravenous Every 12 hours 09/02/17 1122 09/03/17 1309   09/02/17 0430  vancomycin (VANCOCIN) IVPB 1000 mg/200 mL premix  Status:  Discontinued      1,000 mg 200 mL/hr over 60 Minutes Intravenous Every 12 hours 09/02/17 0418 09/04/17 0851   09/02/17 0430  ceFEPIme (MAXIPIME) 1 Sheppard in dextrose 5 % 50 mL IVPB  Status:  Discontinued     1 Sheppard 100 mL/hr over 30 Minutes Intravenous Every 8 hours 09/02/17 0418 09/02/17 1111      Best Practice/Protocols:  VTE Prophylaxis: Lovenox (prophylaxtic dose) Continous Sedation  Consults:     Studies:    Events:  Subjective:    Overnight Issues:   Objective:  Vital signs for last 24 hours: Temp:  [98.3 F (36.8 C)-99.9 F (37.7 C)] 98.3 F (36.8 C) (11/30 0800) Pulse Rate:  [84-106] 101 (11/30 0800) Resp:  [13-32] 30 (11/30 0800) BP: (101-145)/(68-86) 140/86 (11/30 0800) SpO2:  [90 %-98 %] 98 % (11/30 0800)  FiO2 (%):  [40 %-60 %] 50 % (11/30 0800) Weight:  [79.1 kg (174 lb 6.1 oz)] 79.1 kg (174 lb 6.1 oz) (11/30 0439)  Hemodynamic parameters for last 24 hours: CVP:  [9 mmHg-16 mmHg] 13 mmHg  Intake/Output from previous day: 11/29 0701 - 11/30 0700 In: 2839.1 [I.V.:1084.1; NG/GT:1485] Out: 0623 [Urine:3725; Stool:500]  Intake/Output this shift: Total I/O In: 105 [I.V.:40; Other:10; NG/GT:55] Out: -   Vent settings for last 24 hours: Vent Mode: PRVC FiO2 (%):  [40 %-60 %] 50 % Set Rate:  [26 bmp] 26 bmp Vt Set:  [600 mL] 600 mL PEEP:  [5 cmH20] 5 cmH20 Pressure Support:  [5 cmH20] 5 cmH20 Plateau Pressure:  [24 JSE83-15 cmH20] 27 cmH20  Physical Exam:  General: alert Neuro: F/C HEENT/Neck: ETT Resp: few rhonchi CVS: RRR GI: soft, less distended, +BS Extremities: edema 1+  Results for orders placed or performed during the hospital encounter of 08/30/17 (from the past 24 hour(s))  Glucose, capillary     Status: Abnormal   Collection Time: 09/08/17 12:02 PM  Result Value Ref Range   Glucose-Capillary 112 (H) 65 - 99 mg/dL  Glucose, capillary     Status: Abnormal   Collection Time: 09/08/17  4:20 PM  Result Value Ref Range   Glucose-Capillary 131 (H) 65 - 99  mg/dL  Glucose, capillary     Status: Abnormal   Collection Time: 09/08/17  7:45 PM  Result Value Ref Range   Glucose-Capillary 114 (H) 65 - 99 mg/dL  Glucose, capillary     Status: Abnormal   Collection Time: 09/08/17 11:52 PM  Result Value Ref Range   Glucose-Capillary 124 (H) 65 - 99 mg/dL  Glucose, capillary     Status: Abnormal   Collection Time: 09/09/17  3:44 AM  Result Value Ref Range   Glucose-Capillary 132 (H) 65 - 99 mg/dL  CBC     Status: Abnormal   Collection Time: 09/09/17  4:36 AM  Result Value Ref Range   WBC 11.6 (H) 4.0 - 10.5 K/uL   RBC 3.11 (L) 4.22 - 5.81 MIL/uL   Hemoglobin 9.1 (L) 13.0 - 17.0 Sheppard/dL   HCT 29.1 (L) 39.0 - 52.0 %   MCV 93.6 78.0 - 100.0 fL   MCH 29.3 26.0 - 34.0 pg   MCHC 31.3 30.0 - 36.0 Sheppard/dL   RDW 15.3 11.5 - 15.5 %   Platelets 349 150 - 400 K/uL  Basic metabolic panel     Status: Abnormal   Collection Time: 09/09/17  4:36 AM  Result Value Ref Range   Sodium 145 135 - 145 mmol/L   Potassium 4.0 3.5 - 5.1 mmol/L   Chloride 109 101 - 111 mmol/L   CO2 30 22 - 32 mmol/L   Glucose, Bld 136 (H) 65 - 99 mg/dL   BUN 30 (H) 6 - 20 mg/dL   Creatinine, Ser 0.84 0.61 - 1.24 mg/dL   Calcium 8.1 (L) 8.9 - 10.3 mg/dL   GFR calc non Af Amer >60 >60 mL/min   GFR calc Af Amer >60 >60 mL/min   Anion gap 6 5 - 15  Glucose, capillary     Status: Abnormal   Collection Time: 09/09/17  7:41 AM  Result Value Ref Range   Glucose-Capillary 135 (H) 65 - 99 mg/dL    Assessment & Plan: Present on Admission: . Multiple rib fractures    LOS: 9 days   Additional comments:I reviewed the patient's new clinical lab test results. . Fall  from ladder 10 feet Multiple right rib fractures.-multimodal pain control, epidural Right pneumothorax-pigtail removed 11/27 - CXR in AM VDRF- continue weaning, may be able to extubate today. CXR with small recurrent PTX - will watch. COPD and tobacco abuse.-Continue Spiriva and Dulera; Duonebs. Guaifenesin prn for thick  secretions FEN - add free water per RD VTE - lovenox, will need ot hold for 12h prior to removal of epidural ID - ceftriaxone (11/25>>) empiric for HCAP, resp CX so far just yeast plus GPC, MRSA swab neg Acute urinary retention - foley replaced, on urecholine Dispo: ICU.  Critical Care Total Time*: 31 Minutes  Georganna Skeans, MD, MPH, [facility] Trauma: [phone] General Surgery: [phone]  09/09/2017  *Care during the described time interval was provided by me. I have reviewed this patient's available data, including medical history, events of note, physical examination and test results as part of my evaluation.  Patient ID: Derrick Sheppard, male   DOB: 1956-04-01, 61 y.o.   MRN: [id number]

## 2017-09-09 NOTE — Progress Notes (Signed)
Patient ID: Derrick Sheppard, male   DOB: [birth year], 61 y.o.   MRN: [id number] I updated his wife at the bedside.  Georganna Skeans, MD, MPH, FACS Trauma: [phone] General Surgery: [phone]

## 2017-09-09 NOTE — Progress Notes (Signed)
Dr Grandville Silos notified of patient with extreme anxiety and stridor. Orders given for racepinephrine and ativan. Medications given with Dr Grandville Silos at bedside. Emotional support given to the patient. Anesthesia to come reintubate.

## 2017-09-10 ENCOUNTER — Inpatient Hospital Stay (HOSPITAL_COMMUNITY): Payer: 59

## 2017-09-10 LAB — GLUCOSE, CAPILLARY
GLUCOSE-CAPILLARY: 176 mg/dL — AB (ref 65–99)
GLUCOSE-CAPILLARY: 164 mg/dL — AB (ref 65–99)
Glucose-Capillary: 138 mg/dL — ABNORMAL HIGH (ref 65–99)
Glucose-Capillary: 185 mg/dL — ABNORMAL HIGH (ref 65–99)
Glucose-Capillary: 152 mg/dL — ABNORMAL HIGH (ref 65–99)

## 2017-09-10 LAB — CBC
HEMATOCRIT: 28 % — AB (ref 39.0–52.0)
HEMOGLOBIN: 8.8 g/dL — AB (ref 13.0–17.0)
MCH: 29.3 pg (ref 26.0–34.0)
MCHC: 31.4 g/dL (ref 30.0–36.0)
MCV: 93.3 fL (ref 78.0–100.0)
Platelets: 341 10*3/uL (ref 150–400)
RBC: 3 MIL/uL — AB (ref 4.22–5.81)
RDW: 14.7 % (ref 11.5–15.5)
WBC: 9.5 10*3/uL (ref 4.0–10.5)

## 2017-09-10 LAB — BASIC METABOLIC PANEL
ANION GAP: 9 (ref 5–15)
BUN: 34 mg/dL — AB (ref 6–20)
CALCIUM: 8.2 mg/dL — AB (ref 8.9–10.3)
CHLORIDE: 110 mmol/L (ref 101–111)
CO2: 26 mmol/L (ref 22–32)
CREATININE: 0.78 mg/dL (ref 0.61–1.24)
GFR calc non Af Amer: >60 mL/min (ref >60)
Glucose, Bld: 172 mg/dL — ABNORMAL HIGH (ref 65–99)
POTASSIUM: 4.4 mmol/L (ref 3.5–5.1)
SODIUM: 145 mmol/L (ref 135–145)

## 2017-09-10 LAB — TRIGLYCERIDES: Triglycerides: 73 mg/dL (ref <150)

## 2017-09-10 MED ORDER — SODIUM CHLORIDE 0.9 % IV SOLN
INTRAVENOUS | Status: DC
  Administered 2017-09-10: 500 mL via INTRAVENOUS

## 2017-09-10 MED ORDER — PROPOFOL 1000 MG/100ML IV EMUL
0.0000 ug/kg/min | INTRAVENOUS | Status: DC
  Administered 2017-09-10: 5 ug/kg/min via INTRAVENOUS
  Administered 2017-09-10 (×2): 30 ug/kg/min via INTRAVENOUS
  Administered 2017-09-11: 20 ug/kg/min via INTRAVENOUS
  Administered 2017-09-11 – 2017-09-12 (×2): 10 ug/kg/min via INTRAVENOUS
  Administered 2017-09-12: 20 ug/kg/min via INTRAVENOUS
  Filled 2017-09-10 (×8): qty 100

## 2017-09-10 MED ORDER — INSULIN ASPART 100 UNIT/ML ~~LOC~~ SOLN
0.0000 [IU] | SUBCUTANEOUS | Status: DC
  Administered 2017-09-11 (×3): 2 [IU] via SUBCUTANEOUS
  Administered 2017-09-11 – 2017-09-12 (×4): 3 [IU] via SUBCUTANEOUS
  Administered 2017-09-13: 2 [IU] via SUBCUTANEOUS
  Administered 2017-09-13 – 2017-09-15 (×3): 3 [IU] via SUBCUTANEOUS

## 2017-09-10 NOTE — Progress Notes (Signed)
Follow up - Trauma Critical Care  Patient Details:    Derrick Sheppard is an 61 y.o. male.  Lines/tubes : Airway 8 mm (Active)  Secured at (cm) 25 cm 09/10/2017  3:30 AM  Measured From Lips 09/10/2017  3:30 AM  [place] 09/10/2017  3:30 AM  Secured By [employer] 09/10/2017  3:30 AM  Tube Holder Repositioned Yes 09/10/2017  3:30 AM  Cuff Pressure (cm H2O) 28 cm H2O 09/09/2017 11:52 AM  Site Condition Dry 09/10/2017  3:30 AM     CVC Triple Lumen 09/02/17 Right Subclavian (Active)  Indication for Insertion or Continuance of Line Prolonged intravenous therapies 09/10/2017  7:37 AM  Site Assessment Clean;Dry;Intact 09/10/2017  7:37 AM  Proximal Lumen Status Infusing 09/10/2017  7:37 AM  Medial Lumen Status Infusing 09/10/2017  7:37 AM  Distal Lumen Status In-line blood sampling system in place;Blood return noted;Flushed 09/10/2017  7:37 AM  Dressing Type Transparent;Occlusive 09/10/2017  7:37 AM  Dressing Status Clean;Dry;Intact;Antimicrobial disc in place 09/10/2017  7:37 AM  Line Care Connections checked and tightened;Zeroed and calibrated 09/10/2017  7:37 AM  Dressing Intervention Dressing changed;Antimicrobial disc changed 09/09/2017  4:00 AM  Dressing Change Due 09/16/17 09/10/2017  7:37 AM     Epidural Catheter 09/05/17 (Active)  Site Assessment Clean;Dry;Intact 09/10/2017  7:37 AM  Line Status Infusing 09/10/2017  7:37 AM  Dressing Status Clean;Dry;Intact 09/10/2017  7:37 AM  Dressing Change Due 09/12/17 09/10/2017  7:37 AM     NG/OG Tube Orogastric Center mouth Xray (Active)  External Length of Tube (cm) - (if applicable) 44 cm 85/11/7739  8:00 AM  Site Assessment Clean;Intact 09/09/2017  2:00 PM  Ongoing Placement Verification No change in cm markings or external length of tube from initial placement;No change in respiratory status;No acute changes, not attributed to clinical condition 09/09/2017  2:00 PM  Status Infusing tube feed;Irrigated 09/09/2017  2:00 PM   Drainage Appearance Green 09/05/2017  8:00 PM  Output (mL) 200 mL 09/06/2017  2:00 PM     Rectal Tube/Pouch (Active)  Output (mL) 100 mL 09/09/2017  4:00 AM     Urethral Catheter [name] (Active)  Indication for Insertion or Continuance of Catheter Acute urinary retention 09/09/2017  8:00 PM  Site Assessment Clean;Intact;Dry 09/09/2017  8:00 PM  Catheter Maintenance Bag below level of bladder;Drainage bag/tubing not touching floor;Catheter secured;Insertion date on drainage bag;No dependent loops;Seal intact;Bag emptied prior to transport 09/09/2017  8:00 PM  Collection Container Standard drainage bag 09/09/2017  8:00 PM  Securement Method Leg strap 09/09/2017  8:00 PM  Urinary Catheter Interventions Unclamped 09/09/2017  8:00 PM  Output (mL) 335 mL 09/10/2017  6:00 AM    Microbiology/Sepsis markers: Results for orders placed or performed during the hospital encounter of 08/30/17  MRSA PCR Screening     Status: None   Collection Time: 08/31/17  9:05 PM  Result Value Ref Range Status   MRSA by PCR NEGATIVE NEGATIVE Final    Comment:        The GeneXpert MRSA Assay (FDA approved for NASAL specimens only), is one component of a comprehensive MRSA colonization surveillance program. It is not intended to diagnose MRSA infection nor to guide or monitor treatment for MRSA infections.   Culture, respiratory (NON-Expectorated)     Status: None   Collection Time: 09/02/17 11:14 AM  Result Value Ref Range Status   Specimen Description TRACHEAL ASPIRATE  Final   Special Requests NONE  Final   Gram Stain  Final    MODERATE WBC PRESENT, PREDOMINANTLY PMN RARE SQUAMOUS EPITHELIAL CELLS PRESENT ABUNDANT GRAM POSITIVE COCCI IN PAIRS IN CHAINS    Culture Consistent with normal respiratory flora.  Final   Report Status 09/05/2017 FINAL  Final  Culture, blood (Routine X 2) w Reflex to ID Panel     Status: None   Collection Time: 09/02/17 11:30 AM  Result Value Ref Range Status    Specimen Description BLOOD RIGHT ANTECUBITAL  Final   Special Requests IN PEDIATRIC BOTTLE Blood Culture adequate volume  Final   Culture NO GROWTH 5 DAYS  Final   Report Status 09/07/2017 FINAL  Final  Culture, blood (Routine X 2) w Reflex to ID Panel     Status: None   Collection Time: 09/02/17 11:34 AM  Result Value Ref Range Status   Specimen Description BLOOD RIGHT ANTECUBITAL  Final   Special Requests IN PEDIATRIC BOTTLE Blood Culture adequate volume  Final   Culture NO GROWTH 5 DAYS  Final   Report Status 09/07/2017 FINAL  Final  Culture, respiratory (NON-Expectorated)     Status: None   Collection Time: 09/05/17 11:38 AM  Result Value Ref Range Status   Specimen Description TRACHEAL ASPIRATE  Final   Special Requests Normal  Final   Gram Stain   Final    FEW WBC PRESENT, PREDOMINANTLY MONONUCLEAR NO SQUAMOUS EPITHELIAL CELLS SEEN FEW YEAST WITH PSEUDOHYPHAE RARE GRAM POSITIVE COCCI IN PAIRS IN CLUSTERS    Culture FEW CANDIDA TROPICALIS  Final   Report Status 09/07/2017 FINAL  Final    Anti-infectives:  Anti-infectives (From admission, onward)   Start     Dose/Rate Route Frequency Ordered Stop   09/04/17 1400  cefTRIAXone (ROCEPHIN) 2 g in dextrose 5 % 50 mL IVPB     2 g 100 mL/hr over 30 Minutes Intravenous Every 24 hours 09/04/17 0857     09/04/17 0900  cefTRIAXone (ROCEPHIN) 1 g in dextrose 5 % 50 mL IVPB  Status:  Discontinued     1 g 100 mL/hr over 30 Minutes Intravenous Every 24 hours 09/04/17 0851 09/04/17 0857   09/03/17 1400  cefTAZidime (FORTAZ) 2 g in dextrose 5 % 50 mL IVPB  Status:  Discontinued     2 g 100 mL/hr over 30 Minutes Intravenous Every 8 hours 09/03/17 1309 09/04/17 0851   09/02/17 1400  cefTAZidime (FORTAZ) 2 g in dextrose 5 % 50 mL IVPB  Status:  Discontinued     2 g 100 mL/hr over 30 Minutes Intravenous Every 12 hours 09/02/17 1122 09/03/17 1309   09/02/17 0430  vancomycin (VANCOCIN) IVPB 1000 mg/200 mL premix  Status:  Discontinued      1,000 mg 200 mL/hr over 60 Minutes Intravenous Every 12 hours 09/02/17 0418 09/04/17 0851   09/02/17 0430  ceFEPIme (MAXIPIME) 1 g in dextrose 5 % 50 mL IVPB  Status:  Discontinued     1 g 100 mL/hr over 30 Minutes Intravenous Every 8 hours 09/02/17 0418 09/02/17 1111      Best Practice/Protocols:  VTE Prophylaxis: Lovenox (prophylaxtic dose) Continous Sedation  Subjective:    Overnight Issues:   Objective:  Vital signs for last 24 hours: Temp:  [98.3 F (36.8 C)-100.9 F (38.3 C)] 99 F (37.2 C) (12/01 0330) Pulse Rate:  [48-126] 71 (12/01 0700) Resp:  [18-30] 26 (12/01 0700) BP: (94-150)/(59-86) 110/62 (12/01 0700) SpO2:  [88 %-100 %] 97 % (12/01 0700) FiO2 (%):  [40 %-100 %] 60 % (12/01 0330) Weight:  [  76.8 kg (169 lb 5 oz)] 76.8 kg (169 lb 5 oz) (12/01 0300)  Hemodynamic parameters for last 24 hours: CVP:  [7 mmHg-26 mmHg] 18 mmHg  Intake/Output from previous day: 11/30 0701 - 12/01 0700 In: 2130.3 [I.V.:648.3; NG/GT:1242.1] Out: 2510 [Urine:2510]  Intake/Output this shift: No intake/output data recorded.  Vent settings for last 24 hours: Vent Mode: PRVC FiO2 (%):  [40 %-100 %] 60 % Set Rate:  [26 bmp] 26 bmp Vt Set:  [600 mL] 600 mL PEEP:  [5 cmH20-8 cmH20] 8 cmH20 Pressure Support:  [10 cmH20] 10 cmH20 Plateau Pressure:  [22 cmH20-26 cmH20] 22 cmH20  Physical Exam:  General: awake on vent Neuro: f/c HEENT/Neck: ETT Resp: clear to auscultation bilaterally CVS: RRR GI: soft, NT, less dist  Results for orders placed or performed during the hospital encounter of 08/30/17 (from the past 24 hour(s))  Glucose, capillary     Status: Abnormal   Collection Time: 09/09/17 12:05 PM  Result Value Ref Range   Glucose-Capillary 143 (H) 65 - 99 mg/dL  Glucose, capillary     Status: Abnormal   Collection Time: 09/09/17  3:21 PM  Result Value Ref Range   Glucose-Capillary 173 (H) 65 - 99 mg/dL  Glucose, capillary     Status: Abnormal   Collection Time: 09/09/17   8:04 PM  Result Value Ref Range   Glucose-Capillary 135 (H) 65 - 99 mg/dL   Comment 1 Notify RN    Comment 2 Document in Chart   Glucose, capillary     Status: Abnormal   Collection Time: 09/09/17 11:43 PM  Result Value Ref Range   Glucose-Capillary 128 (H) 65 - 99 mg/dL   Comment 1 Notify RN    Comment 2 Document in Chart   Glucose, capillary     Status: Abnormal   Collection Time: 09/10/17  3:38 AM  Result Value Ref Range   Glucose-Capillary 176 (H) 65 - 99 mg/dL   Comment 1 Notify RN    Comment 2 Document in Chart   CBC     Status: Abnormal   Collection Time: 09/10/17  4:42 AM  Result Value Ref Range   WBC 9.5 4.0 - 10.5 K/uL   RBC 3.00 (L) 4.22 - 5.81 MIL/uL   Hemoglobin 8.8 (L) 13.0 - 17.0 g/dL   HCT 28.0 (L) 39.0 - 52.0 %   MCV 93.3 78.0 - 100.0 fL   MCH 29.3 26.0 - 34.0 pg   MCHC 31.4 30.0 - 36.0 g/dL   RDW 14.7 11.5 - 15.5 %   Platelets 341 150 - 400 K/uL  Basic metabolic panel     Status: Abnormal   Collection Time: 09/10/17  4:42 AM  Result Value Ref Range   Sodium 145 135 - 145 mmol/L   Potassium 4.4 3.5 - 5.1 mmol/L   Chloride 110 101 - 111 mmol/L   CO2 26 22 - 32 mmol/L   Glucose, Bld 172 (H) 65 - 99 mg/dL   BUN 34 (H) 6 - 20 mg/dL   Creatinine, Ser 0.78 0.61 - 1.24 mg/dL   Calcium 8.2 (L) 8.9 - 10.3 mg/dL   GFR calc non Af Amer >60 >60 mL/min   GFR calc Af Amer >60 >60 mL/min   Anion gap 9 5 - 15    Assessment & Plan: Present on Admission: . Multiple rib fractures    LOS: 10 days   Additional comments:I reviewed the patient's new clinical lab test results. and CXR Fall from ladder 10  feet Multiple right rib fractures.-multimodal pain control, epidural Right pneumothorax-pigtail removed 11/27 - CXR today with decrease in small R PTX VDRF- failed extubation 11/30 due to stridor, solumedrol, wean back to nl settings as able COPD and tobacco abuse.-Continue Spiriva and Dulera; Duonebs. Guaifenesin prn for thick secretions FEN - add free water  per RD VTE - lovenox, will need ot hold for 12h prior to removal of epidural ID - ceftriaxone (11/25>>) empiric for HCAP, resp CX so far just yeast plus GPC, MRSA swab neg Acute urinary retention - foley replaced, on urecholine Dispo: ICU.  Critical Care Total Time*: 30 Minutes  Georganna Skeans, MD, MPH, [facility] Trauma: [phone] General Surgery: [phone]  09/10/2017  *Care during the described time interval was provided by me. I have reviewed this patient's available data, including medical history, events of note, physical examination and test results as part of my evaluation.  Patient ID: ARTYOM STENCEL, male   DOB: 03-Sep-1956, 61 y.o.   MRN: [id number]

## 2017-09-11 ENCOUNTER — Inpatient Hospital Stay (HOSPITAL_COMMUNITY): Payer: 59

## 2017-09-11 LAB — BASIC METABOLIC PANEL
ANION GAP: 7 (ref 5–15)
BUN: 39 mg/dL — AB (ref 6–20)
CHLORIDE: 113 mmol/L — AB (ref 101–111)
CO2: 26 mmol/L (ref 22–32)
Calcium: 8.4 mg/dL — ABNORMAL LOW (ref 8.9–10.3)
Creatinine, Ser: 0.72 mg/dL (ref 0.61–1.24)
Glucose, Bld: 151 mg/dL — ABNORMAL HIGH (ref 65–99)
POTASSIUM: 4.9 mmol/L (ref 3.5–5.1)
SODIUM: 146 mmol/L — AB (ref 135–145)

## 2017-09-11 LAB — CBC
HCT: 27.9 % — ABNORMAL LOW (ref 39.0–52.0)
HEMOGLOBIN: 8.8 g/dL — AB (ref 13.0–17.0)
MCH: 29.5 pg (ref 26.0–34.0)
MCHC: 31.5 g/dL (ref 30.0–36.0)
MCV: 93.6 fL (ref 78.0–100.0)
PLATELETS: 439 10*3/uL — AB (ref 150–400)
RBC: 2.98 MIL/uL — AB (ref 4.22–5.81)
RDW: 14.9 % (ref 11.5–15.5)
WBC: 8.6 10*3/uL (ref 4.0–10.5)

## 2017-09-11 LAB — GLUCOSE, CAPILLARY
GLUCOSE-CAPILLARY: 108 mg/dL — AB (ref 65–99)
GLUCOSE-CAPILLARY: 144 mg/dL — AB (ref 65–99)

## 2017-09-11 NOTE — Progress Notes (Signed)
Follow up - Trauma and Critical Care  Patient Details:    Derrick Sheppard is an 61 y.o. male.  Lines/tubes : Airway 8 mm (Active)  Secured at (cm) 25 cm 09/11/2017  3:00 AM  Measured From Lips 09/11/2017  3:00 AM  Secured Location Left 09/11/2017  3:00 AM  Secured By [employer] 09/11/2017  3:00 AM  Tube Holder Repositioned Yes 09/11/2017  3:00 AM  Cuff Pressure (cm H2O) 28 cm H2O 09/10/2017  3:18 PM  Site Condition Dry 09/11/2017  3:00 AM     CVC Triple Lumen 09/02/17 Right Subclavian (Active)  Indication for Insertion or Continuance of Line Prolonged intravenous therapies 09/11/2017  7:46 AM  Site Assessment Clean;Dry;Intact 09/11/2017  7:46 AM  Proximal Lumen Status Infusing 09/11/2017  7:46 AM  Medial Lumen Status Infusing 09/11/2017  7:46 AM  Distal Lumen Status In-line blood sampling system in place 09/11/2017  7:46 AM  Dressing Type Transparent;Occlusive 09/11/2017  7:46 AM  Dressing Status Clean;Dry;Intact;Antimicrobial disc in place 09/11/2017  7:46 AM  Line Care Connections checked and tightened;Zeroed and calibrated 09/11/2017  7:46 AM  Dressing Intervention Dressing changed;Antimicrobial disc changed 09/09/2017  4:00 AM  Dressing Change Due 09/16/17 09/11/2017  7:46 AM     Epidural Catheter 09/05/17 (Active)  Site Assessment Clean;Dry;Intact 09/11/2017  7:46 AM  Line Status Infusing 09/11/2017  7:46 AM  Dressing Type Transparent;Occlusive 09/11/2017  7:46 AM  Dressing Status Clean;Dry;Intact 09/11/2017  7:46 AM  Dressing Change Due 09/12/17 09/11/2017  7:46 AM     NG/OG Tube Orogastric Center mouth Xray (Active)  External Length of Tube (cm) - (if applicable) 44 cm 49/70/2637  8:00 AM  Site Assessment Clean;Intact 09/10/2017  8:00 PM  Ongoing Placement Verification No change in respiratory status;No acute changes, not attributed to clinical condition 09/10/2017  8:00 PM  Status Infusing tube feed 09/10/2017  8:00 PM  Drainage Appearance Green 09/05/2017  8:00 PM  Output  (mL) 200 mL 09/06/2017  2:00 PM     Rectal Tube/Pouch (Active)  Output (mL) 400 mL 09/10/2017  6:00 PM     Urethral Catheter [name] (Active)  Indication for Insertion or Continuance of Catheter Other (comment) 09/11/2017  8:19 AM  Site Assessment Clean;Intact;Dry 09/10/2017  8:00 PM  Catheter Maintenance Bag below level of bladder;Catheter secured;Drainage bag/tubing not touching floor;Insertion date on drainage bag;No dependent loops;Seal intact;Bag emptied prior to transport 09/11/2017  8:00 AM  Collection Container Standard drainage bag 09/10/2017  8:00 PM  Securement Method Leg strap 09/10/2017  8:00 PM  Urinary Catheter Interventions Unclamped 09/10/2017  8:00 PM  Output (mL) 160 mL 09/11/2017  5:00 AM    Microbiology/Sepsis markers: Results for orders placed or performed during the hospital encounter of 08/30/17  MRSA PCR Screening     Status: None   Collection Time: 08/31/17  9:05 PM  Result Value Ref Range Status   MRSA by PCR NEGATIVE NEGATIVE Final    Comment:        The GeneXpert MRSA Assay (FDA approved for NASAL specimens only), is one component of a comprehensive MRSA colonization surveillance program. It is not intended to diagnose MRSA infection nor to guide or monitor treatment for MRSA infections.   Culture, respiratory (NON-Expectorated)     Status: None   Collection Time: 09/02/17 11:14 AM  Result Value Ref Range Status   Specimen Description TRACHEAL ASPIRATE  Final   Special Requests NONE  Final   Gram Stain   Final    MODERATE  WBC PRESENT, PREDOMINANTLY PMN RARE SQUAMOUS EPITHELIAL CELLS PRESENT ABUNDANT GRAM POSITIVE COCCI IN PAIRS IN CHAINS    Culture Consistent with normal respiratory flora.  Final   Report Status 09/05/2017 FINAL  Final  Culture, blood (Routine X 2) w Reflex to ID Panel     Status: None   Collection Time: 09/02/17 11:30 AM  Result Value Ref Range Status   Specimen Description BLOOD RIGHT ANTECUBITAL  Final   Special Requests IN  PEDIATRIC BOTTLE Blood Culture adequate volume  Final   Culture NO GROWTH 5 DAYS  Final   Report Status 09/07/2017 FINAL  Final  Culture, blood (Routine X 2) w Reflex to ID Panel     Status: None   Collection Time: 09/02/17 11:34 AM  Result Value Ref Range Status   Specimen Description BLOOD RIGHT ANTECUBITAL  Final   Special Requests IN PEDIATRIC BOTTLE Blood Culture adequate volume  Final   Culture NO GROWTH 5 DAYS  Final   Report Status 09/07/2017 FINAL  Final  Culture, respiratory (NON-Expectorated)     Status: None   Collection Time: 09/05/17 11:38 AM  Result Value Ref Range Status   Specimen Description TRACHEAL ASPIRATE  Final   Special Requests Normal  Final   Gram Stain   Final    FEW WBC PRESENT, PREDOMINANTLY MONONUCLEAR NO SQUAMOUS EPITHELIAL CELLS SEEN FEW YEAST WITH PSEUDOHYPHAE RARE GRAM POSITIVE COCCI IN PAIRS IN CLUSTERS    Culture FEW CANDIDA TROPICALIS  Final   Report Status 09/07/2017 FINAL  Final    Anti-infectives:  Anti-infectives (From admission, onward)   Start     Dose/Rate Route Frequency Ordered Stop   09/04/17 1400  cefTRIAXone (ROCEPHIN) 2 g in dextrose 5 % 50 mL IVPB     2 g 100 mL/hr over 30 Minutes Intravenous Every 24 hours 09/04/17 0857     09/04/17 0900  cefTRIAXone (ROCEPHIN) 1 g in dextrose 5 % 50 mL IVPB  Status:  Discontinued     1 g 100 mL/hr over 30 Minutes Intravenous Every 24 hours 09/04/17 0851 09/04/17 0857   09/03/17 1400  cefTAZidime (FORTAZ) 2 g in dextrose 5 % 50 mL IVPB  Status:  Discontinued     2 g 100 mL/hr over 30 Minutes Intravenous Every 8 hours 09/03/17 1309 09/04/17 0851   09/02/17 1400  cefTAZidime (FORTAZ) 2 g in dextrose 5 % 50 mL IVPB  Status:  Discontinued     2 g 100 mL/hr over 30 Minutes Intravenous Every 12 hours 09/02/17 1122 09/03/17 1309   09/02/17 0430  vancomycin (VANCOCIN) IVPB 1000 mg/200 mL premix  Status:  Discontinued     1,000 mg 200 mL/hr over 60 Minutes Intravenous Every 12 hours 09/02/17 0418  09/04/17 0851   09/02/17 0430  ceFEPIme (MAXIPIME) 1 g in dextrose 5 % 50 mL IVPB  Status:  Discontinued     1 g 100 mL/hr over 30 Minutes Intravenous Every 8 hours 09/02/17 0418 09/02/17 1111      Best Practice/Protocols:  GI Prophylaxis: Proton Pump Inhibitor Intermittent Sedation  Consults:     Events:  Subjective:    Overnight Issues: None  More awake on vent   Objective:  Vital signs for last 24 hours: Temp:  [97.4 F (36.3 C)-98 F (36.7 C)] 97.5 F (36.4 C) (12/02 0800) Pulse Rate:  [57-98] 66 (12/02 0800) Resp:  [21-28] 26 (12/02 0800) BP: (91-124)/(53-75) 101/64 (12/02 0800) SpO2:  [93 %-100 %] 95 % (12/02 0800) FiO2 (%):  [  40 %-50 %] 40 % (12/02 0800) Weight:  [79.7 kg (175 lb 11.3 oz)] 79.7 kg (175 lb 11.3 oz) (12/02 0200)  Hemodynamic parameters for last 24 hours: CVP:  [8 mmHg-18 mmHg] 16 mmHg  Intake/Output from previous day: 12/01 0701 - 12/02 0700 In: 2665.5 [I.V.:1105.5; NG/GT:1320] Out: 2235 [Urine:1835; Stool:400]  Intake/Output this shift: No intake/output data recorded.  Vent settings for last 24 hours: Vent Mode: PRVC FiO2 (%):  [40 %-50 %] 40 % Set Rate:  [26 bmp] 26 bmp Vt Set:  [600 mL] 600 mL PEEP:  [8 cmH20] 8 cmH20 Plateau Pressure:  [22 [id number] cmH20] 22 cmH20  Physical Exam:  General: alert and no respiratory distress Resp: clear to auscultation bilaterally CVS: regular rate and rhythm, S1, S2 normal, no murmur, click, rub or gallop GI: soft, nontender, BS WNL, no r/g  Results for orders placed or performed during the hospital encounter of 08/30/17 (from the past 24 hour(s))  Glucose, capillary     Status: Abnormal   Collection Time: 09/10/17 11:36 AM  Result Value Ref Range   Glucose-Capillary 138 (H) 65 - 99 mg/dL  Glucose, capillary     Status: Abnormal   Collection Time: 09/10/17  4:23 PM  Result Value Ref Range   Glucose-Capillary 185 (H) 65 - 99 mg/dL  Glucose, capillary     Status: Abnormal   Collection Time:  09/10/17  7:42 PM  Result Value Ref Range   Glucose-Capillary 152 (H) 65 - 99 mg/dL  CBC     Status: Abnormal   Collection Time: 09/11/17  4:56 AM  Result Value Ref Range   WBC 8.6 4.0 - 10.5 K/uL   RBC 2.98 (L) 4.22 - 5.81 MIL/uL   Hemoglobin 8.8 (L) 13.0 - 17.0 g/dL   HCT 27.9 (L) 39.0 - 52.0 %   MCV 93.6 78.0 - 100.0 fL   MCH 29.5 26.0 - 34.0 pg   MCHC 31.5 30.0 - 36.0 g/dL   RDW 14.9 11.5 - 15.5 %   Platelets 439 (H) 150 - 400 K/uL  Basic metabolic panel     Status: Abnormal   Collection Time: 09/11/17  4:56 AM  Result Value Ref Range   Sodium 146 (H) 135 - 145 mmol/L   Potassium 4.9 3.5 - 5.1 mmol/L   Chloride 113 (H) 101 - 111 mmol/L   CO2 26 22 - 32 mmol/L   Glucose, Bld 151 (H) 65 - 99 mg/dL   BUN 39 (H) 6 - 20 mg/dL   Creatinine, Ser 0.72 0.61 - 1.24 mg/dL   Calcium 8.4 (L) 8.9 - 10.3 mg/dL   GFR calc non Af Amer >60 >60 mL/min   GFR calc Af Amer >60 >60 mL/min   Anion gap 7 5 - 15     Assessment/Plan:  Fall from ladder 10 feet Multiple right rib fractures.-multimodal pain control, epidural Right pneumothorax-pigtail removed 11/27 - CXR today with decrease in small R PTX VDRF- failed extubation 11/30 due to stridor, solumedrol, wean back to nl settings as able wean PEEP to 5  COPD and tobacco abuse.-Continue Spiriva and Dulera; Duonebs. Guaifenesin prn for thick secretions FEN - add free water per RD VTE - lovenox, will need ot hold for 12h prior to removal of epidural ID - ceftriaxone (11/25>>) empiric for HCAP, resp CX so far just yeast plus GPC, MRSA swab neg Acute urinary retention - foley replaced, on urecholine need strict I/O since reintubated and has severe COPD Dispo: ICU.  LOS: 11 days   Additional comments:None  Critical Care Total Time*: 30 Minutes  Marcello Moores A Khyler Urda 09/11/2017  *Care during the described time interval was provided by me and/or other providers on the critical care team.  I have reviewed this patient's available data,  including medical history, events of note, physical examination and test results as part of my evaluation.

## 2017-09-12 LAB — GLUCOSE, CAPILLARY
GLUCOSE-CAPILLARY: 155 mg/dL — AB (ref 65–99)
GLUCOSE-CAPILLARY: 106 mg/dL — AB (ref 65–99)
GLUCOSE-CAPILLARY: 158 mg/dL — AB (ref 65–99)
Glucose-Capillary: 114 mg/dL — ABNORMAL HIGH (ref 65–99)
Glucose-Capillary: 128 mg/dL — ABNORMAL HIGH (ref 65–99)
Glucose-Capillary: 130 mg/dL — ABNORMAL HIGH (ref 65–99)
Glucose-Capillary: 132 mg/dL — ABNORMAL HIGH (ref 65–99)
Glucose-Capillary: 150 mg/dL — ABNORMAL HIGH (ref 65–99)
Glucose-Capillary: 159 mg/dL — ABNORMAL HIGH (ref 65–99)
Glucose-Capillary: 102 mg/dL — ABNORMAL HIGH (ref 65–99)
Glucose-Capillary: 114 mg/dL — ABNORMAL HIGH (ref 65–99)

## 2017-09-12 MED ORDER — FREE WATER
200.0000 mL | Freq: Three times a day (TID) | Status: DC
  Administered 2017-09-12 – 2017-09-13 (×4): 200 mL

## 2017-09-12 NOTE — Progress Notes (Signed)
Patient ID: Derrick Sheppard, male   DOB: [birth year], 61 y.o.   MRN: [id number] Follow up - Trauma Critical Care  Patient Details:    Derrick Sheppard is an 61 y.o. male.  Lines/tubes : Airway 8 mm (Active)  Secured at (cm) 25 cm 09/12/2017  3:15 AM  Measured From Lips 09/12/2017  3:15 AM  Secured Location Center 09/12/2017  3:15 AM  Secured By [employer] 09/12/2017  3:15 AM  Tube Holder Repositioned Yes 09/12/2017  3:15 AM  Cuff Pressure (cm H2O) 28 cm H2O 09/11/2017  3:29 PM  Site Condition Dry 09/12/2017  3:15 AM     CVC Triple Lumen 09/02/17 Right Subclavian (Active)  Indication for Insertion or Continuance of Line Prolonged intravenous therapies 09/12/2017  7:10 AM  Site Assessment Clean;Dry;Intact 09/12/2017  7:10 AM  Proximal Lumen Status Infusing 09/12/2017  7:10 AM  Medial Lumen Status Infusing 09/12/2017  7:10 AM  Distal Lumen Status In-line blood sampling system in place 09/12/2017  7:10 AM  Dressing Type Transparent;Occlusive 09/12/2017  7:10 AM  Dressing Status Clean;Dry;Intact;Antimicrobial disc in place 09/12/2017  7:10 AM  Line Care Connections checked and tightened;Zeroed and calibrated 09/12/2017  7:10 AM  Dressing Intervention Dressing changed;Antimicrobial disc changed 09/09/2017  4:00 AM  Dressing Change Due 09/16/17 09/12/2017  7:10 AM     Epidural Catheter 09/05/17 (Active)  Site Assessment Clean;Dry;Intact 09/12/2017  7:10 AM  Line Status Infusing 09/12/2017  7:10 AM  Dressing Type Transparent 09/12/2017  7:10 AM  Dressing Status Clean;Dry;Intact 09/12/2017  7:10 AM  Dressing Change Due 09/12/17 09/12/2017  7:10 AM     NG/OG Tube Orogastric Center mouth Xray (Active)  External Length of Tube (cm) - (if applicable) 44 cm 70/35/0093  8:00 AM  Site Assessment Clean;Intact 09/11/2017  8:00 PM  Ongoing Placement Verification No change in cm markings or external length of tube from initial placement;No change in respiratory status;No acute changes, not attributed to  clinical condition 09/11/2017  8:00 PM  Status Infusing tube feed 09/11/2017  8:00 PM  Drainage Appearance Green 09/05/2017  8:00 PM  Output (mL) 200 mL 09/06/2017  2:00 PM     Rectal Tube/Pouch (Active)  Output (mL) 400 mL 09/10/2017  6:00 PM     Urethral Catheter [name] (Active)  Indication for Insertion or Continuance of Catheter Other (comment) 09/11/2017  8:00 PM  Site Assessment Clean;Intact;Dry 09/11/2017  8:00 PM  Catheter Maintenance Bag below level of bladder;Catheter secured;Drainage bag/tubing not touching floor;Insertion date on drainage bag;No dependent loops;Seal intact;Bag emptied prior to transport 09/11/2017  8:00 PM  Collection Container Standard drainage bag 09/11/2017  8:00 PM  Securement Method Securing device (Describe) 09/11/2017  8:00 PM  Urinary Catheter Interventions Unclamped 09/11/2017  8:00 PM  Output (mL) 150 mL 09/12/2017  5:30 AM    Microbiology/Sepsis markers: Results for orders placed or performed during the hospital encounter of 08/30/17  MRSA PCR Screening     Status: None   Collection Time: 08/31/17  9:05 PM  Result Value Ref Range Status   MRSA by PCR NEGATIVE NEGATIVE Final    Comment:        The GeneXpert MRSA Assay (FDA approved for NASAL specimens only), is one component of a comprehensive MRSA colonization surveillance program. It is not intended to diagnose MRSA infection nor to guide or monitor treatment for MRSA infections.   Culture, respiratory (NON-Expectorated)     Status: None   Collection Time: 09/02/17 11:14 AM  Result Value Ref  Range Status   Specimen Description TRACHEAL ASPIRATE  Final   Special Requests NONE  Final   Gram Stain   Final    MODERATE WBC PRESENT, PREDOMINANTLY PMN RARE SQUAMOUS EPITHELIAL CELLS PRESENT ABUNDANT GRAM POSITIVE COCCI IN PAIRS IN CHAINS    Culture Consistent with normal respiratory flora.  Final   Report Status 09/05/2017 FINAL  Final  Culture, blood (Routine X 2) w Reflex to ID Panel      Status: None   Collection Time: 09/02/17 11:30 AM  Result Value Ref Range Status   Specimen Description BLOOD RIGHT ANTECUBITAL  Final   Special Requests IN PEDIATRIC BOTTLE Blood Culture adequate volume  Final   Culture NO GROWTH 5 DAYS  Final   Report Status 09/07/2017 FINAL  Final  Culture, blood (Routine X 2) w Reflex to ID Panel     Status: None   Collection Time: 09/02/17 11:34 AM  Result Value Ref Range Status   Specimen Description BLOOD RIGHT ANTECUBITAL  Final   Special Requests IN PEDIATRIC BOTTLE Blood Culture adequate volume  Final   Culture NO GROWTH 5 DAYS  Final   Report Status 09/07/2017 FINAL  Final  Culture, respiratory (NON-Expectorated)     Status: None   Collection Time: 09/05/17 11:38 AM  Result Value Ref Range Status   Specimen Description TRACHEAL ASPIRATE  Final   Special Requests Normal  Final   Gram Stain   Final    FEW WBC PRESENT, PREDOMINANTLY MONONUCLEAR NO SQUAMOUS EPITHELIAL CELLS SEEN FEW YEAST WITH PSEUDOHYPHAE RARE GRAM POSITIVE COCCI IN PAIRS IN CLUSTERS    Culture FEW CANDIDA TROPICALIS  Final   Report Status 09/07/2017 FINAL  Final    Anti-infectives:  Anti-infectives (From admission, onward)   Start     Dose/Rate Route Frequency Ordered Stop   09/04/17 1400  cefTRIAXone (ROCEPHIN) 2 g in dextrose 5 % 50 mL IVPB     2 g 100 mL/hr over 30 Minutes Intravenous Every 24 hours 09/04/17 0857     09/04/17 0900  cefTRIAXone (ROCEPHIN) 1 g in dextrose 5 % 50 mL IVPB  Status:  Discontinued     1 g 100 mL/hr over 30 Minutes Intravenous Every 24 hours 09/04/17 0851 09/04/17 0857   09/03/17 1400  cefTAZidime (FORTAZ) 2 g in dextrose 5 % 50 mL IVPB  Status:  Discontinued     2 g 100 mL/hr over 30 Minutes Intravenous Every 8 hours 09/03/17 1309 09/04/17 0851   09/02/17 1400  cefTAZidime (FORTAZ) 2 g in dextrose 5 % 50 mL IVPB  Status:  Discontinued     2 g 100 mL/hr over 30 Minutes Intravenous Every 12 hours 09/02/17 1122 09/03/17 1309   09/02/17  0430  vancomycin (VANCOCIN) IVPB 1000 mg/200 mL premix  Status:  Discontinued     1,000 mg 200 mL/hr over 60 Minutes Intravenous Every 12 hours 09/02/17 0418 09/04/17 0851   09/02/17 0430  ceFEPIme (MAXIPIME) 1 g in dextrose 5 % 50 mL IVPB  Status:  Discontinued     1 g 100 mL/hr over 30 Minutes Intravenous Every 8 hours 09/02/17 0418 09/02/17 1111      Best Practice/Protocols:  VTE Prophylaxis: Lovenox (prophylaxtic dose) Continous Sedation  Consults:     Studies:    Events:  Subjective:    Overnight Issues:   Objective:  Vital signs for last 24 hours: Temp:  [97.5 F (36.4 C)-100.1 F (37.8 C)] 98.6 F (37 C) (12/03 0400) Pulse Rate:  [56-104]  69 (12/03 0700) Resp:  [12-27] 26 (12/03 0700) BP: (95-146)/(54-95) 103/61 (12/03 0700) SpO2:  [94 %-99 %] 96 % (12/03 0700) FiO2 (%):  [40 %] 40 % (12/03 0315) Weight:  [77.7 kg (171 lb 4.8 oz)] 77.7 kg (171 lb 4.8 oz) (12/03 0100)  Hemodynamic parameters for last 24 hours: CVP:  [2 mmHg-19 mmHg] 13 mmHg  Intake/Output from previous day: 12/02 0701 - 12/03 0700 In: 2467.4 [I.V.:857.4; NG/GT:1320; IV Piggyback:50] Out: 2635 [Urine:2635]  Intake/Output this shift: No intake/output data recorded.  Vent settings for last 24 hours: Vent Mode: PRVC FiO2 (%):  [40 %] 40 % Set Rate:  [26 bmp] 26 bmp Vt Set:  [600 mL] 600 mL PEEP:  [8 cmH20] 8 cmH20 Plateau Pressure:  [14 [id number] cmH20] 17 cmH20  Physical Exam:  General: on vent Neuro: alert and F/C HEENT/Neck: ETT Resp: clear to auscultation bilaterally CVS: RRR GI: distended but soft, NT Extremities: edema 1+  Results for orders placed or performed during the hospital encounter of 08/30/17 (from the past 24 hour(s))  Glucose, capillary     Status: Abnormal   Collection Time: 09/11/17  8:02 AM  Result Value Ref Range   Glucose-Capillary 159 (H) 65 - 99 mg/dL  Glucose, capillary     Status: Abnormal   Collection Time: 09/11/17 11:56 AM  Result Value Ref Range    Glucose-Capillary 108 (H) 65 - 99 mg/dL  Glucose, capillary     Status: Abnormal   Collection Time: 09/11/17  3:40 PM  Result Value Ref Range   Glucose-Capillary 144 (H) 65 - 99 mg/dL  Glucose, capillary     Status: Abnormal   Collection Time: 09/11/17  7:25 PM  Result Value Ref Range   Glucose-Capillary 130 (H) 65 - 99 mg/dL  Glucose, capillary     Status: Abnormal   Collection Time: 09/11/17 11:34 PM  Result Value Ref Range   Glucose-Capillary 114 (H) 65 - 99 mg/dL  Glucose, capillary     Status: Abnormal   Collection Time: 09/12/17  3:23 AM  Result Value Ref Range   Glucose-Capillary 150 (H) 65 - 99 mg/dL    Assessment & Plan: Present on Admission: . Multiple rib fractures    LOS: 12 days   Additional comments:I reviewed the patient's new clinical lab test results. . Fall from ladder 10 feet Multiple right rib fractures.-multimodal pain control, epidural Right pneumothorax-pigtail removed 11/27 - CXR today with decrease in small R PTX VDRF- failed extubation 11/30 due to stridor, solumedrol, wean today with hope to try extubation tomorrow  COPD and tobacco abuse - Continue Spiriva and Dulera; Duonebs. Guaifenesin prn for thick secretions FEN - free water VTE - lovenox, will need ot hold for 12h prior to removal of epidural ID - ceftriaxone (11/25>>) empiric for HCAP, resp CX so far just yeast plus GPC, MRSA swab neg Acute urinary retention - foley replaced, on urecholine, voiding trial Dispo: ICU Critical Care Total Time*: 30 Minutes  Georganna Skeans, MD, MPH, FACS Trauma: [phone] General Surgery: [phone]  09/12/2017  *Care during the described time interval was provided by me. I have reviewed this patient's available data, including medical history, events of note, physical examination and test results as part of my evaluation.

## 2017-09-12 NOTE — Progress Notes (Signed)
Physical Therapy Treatment Patient Details Name: Derrick Sheppard MRN: [id number] DOB: 1956/09/30 Today's Date: 09/12/2017    History of Present Illness 61 yo admitted after fall from ladder with right rib fx, PTX, intubated 11/23 and chest tube placed, epidural cath placed 11/27. PMHx: COPD, CAD, GERD    PT Comments    Pt with increased alertness and able to communicate via writing today, oriented to day and month with ability to follow commands. Pt on PRVC throughout session with VSS with plans to wean today. Pt with increased sitting and standing balance and able to actually step toward [facility] today with  2 person assist. Will continue to follow.     Follow Up Recommendations  CIR;Supervision/Assistance - 24 hour     Equipment Recommendations       Recommendations for Other Services       Precautions / Restrictions Precautions Precautions: Fall Precaution Comments: vent, ETT, epidural cath, Cortrak    Mobility  Bed Mobility Overal bed mobility: Needs Assistance Bed Mobility: Rolling;Sidelying to Sit;Supine to Sit Rolling: Mod assist Sidelying to sit: Mod assist Supine to sit: Mod assist     General bed mobility comments: cues for sequence with assist to roll bil and pt bending knee and reaching to assist with rolling. Assist to elevate trunk and bring legs off of surface as well as to return legs and trunk to surface  Transfers Overall transfer level: Needs assistance   Transfers: Sit to/from Stand Sit to Stand: +2 physical assistance;Mod assist         General transfer comment: max assist with bil knees blocked to provide anterior translation and rise from surface with ability to stand and side step toward [facility] 3' with bil UE support and maintained knee blocking due to buckling  Ambulation/Gait             General Gait Details: unable   Stairs            Wheelchair Mobility    Modified Rankin (Stroke Patients Only)       Balance Overall  balance assessment: Needs assistance Sitting-balance support: Bilateral upper extremity supported;Feet supported Sitting balance-Leahy Scale: Poor Sitting balance - Comments: EOB 5 min with minguard assist and cues for midline with bil UE support   Standing balance support: Bilateral upper extremity supported;During functional activity Standing balance-Leahy Scale: Zero                              Cognition Arousal/Alertness: Awake/alert Behavior During Therapy: WFL for tasks assessed/performed Overall Cognitive Status: Difficult to assess                                        Exercises      General Comments        Pertinent Vitals/Pain Pain Assessment: 0-10 Pain Location: pt reports upset stomach but unrated Pain Descriptors / Indicators: Aching Pain Intervention(s): Limited activity within patient's tolerance;Repositioned    Home Living                      Prior Function            PT Goals (current goals can now be found in the care plan section) Progress towards PT goals: Progressing toward goals    Frequency    Min 3X/week  PT Plan Current plan remains appropriate    Co-evaluation              AM-PAC PT "6 Clicks" Daily Activity  Outcome Measure  Difficulty turning over in bed (including adjusting bedclothes, sheets and blankets)?: Unable Difficulty moving from lying on back to sitting on the side of the bed? : Unable Difficulty sitting down on and standing up from a chair with arms (e.g., wheelchair, bedside commode, etc,.)?: Unable Help needed moving to and from a bed to chair (including a wheelchair)?: A Lot Help needed walking in hospital room?: A Lot Help needed climbing 3-5 steps with a railing? : Total 6 Click Score: 8    End of Session   Activity Tolerance: Patient tolerated treatment well Patient left: in bed;with call bell/phone within reach;with nursing/sitter in room Nurse  Communication: Mobility status;Precautions PT Visit Diagnosis: Other abnormalities of gait and mobility (R26.89);Difficulty in walking, not elsewhere classified (R26.2);Muscle weakness (generalized) (M62.81)     Time: 4847-2072 PT Time Calculation (min) (ACUTE ONLY): 23 min  Charges:  $Therapeutic Activity: 23-37 mins                    G Codes:       Elwyn Reach, PT [phone]    Syerra Abdelrahman B Tonique Mendonca 09/12/2017, 2:10 PM

## 2017-09-12 NOTE — Progress Notes (Signed)
Per Dr Ola Spurr with anesthesia, the epidural dressing is not to be changed.  Will remove dressing tomorrow when epidural catheter is removed. Derrick Sheppard C 3:57 PM

## 2017-09-12 NOTE — Addendum Note (Signed)
Addendum  created 09/12/17 1514 by Suzette Battiest, MD   Sign clinical note

## 2017-09-12 NOTE — Anesthesia Post-op Follow-up Note (Signed)
  Anesthesia Pain Follow-up Note  Patient: Derrick Sheppard  Day #: 7  Date of Follow-up: 09/12/2017 Time: 3:08 PM  Last Vitals:  Vitals:   09/12/17 1300 09/12/17 1400  BP: 127/71 122/67  Pulse: 77 87  Resp: (!) 21 16  Temp:    SpO2: 96% 96%    Level of Consciousness: alert  Pain: mild   Side Effects:None  Catheter Site Exam:clean, dry, no drainage  Anti-Coag Meds (From admission, onward)   Start     Dose/Rate Route Frequency Ordered Stop   09/07/17 1800  enoxaparin (LOVENOX) injection 40 mg     40 mg Subcutaneous Every 24 hours 09/07/17 1614      Epidural / Intrathecal (From admission, onward)   Start     Dose/Rate Route Frequency Ordered Stop   09/05/17 2015  ropivacaine (PF) 2 mg/mL (0.2%) (NAROPIN) injection     10 mL/hr 10 mL/hr  Epidural Continuous 09/05/17 2002 09/14/17 0915       Plan: Continue current therapy of postop epidural at surgeon's request. Pt reintubated on 11/30 for respiratory distress. Discussed with Grandville Silos, MD and wishes to keep in until extubation tomorrow. PPx lovenox dosing currently. Needs 12hrs from last dose prior to removal. Needs d/c tomorrow as infection risk increases beyond 7days.  Tiajuana Amass

## 2017-09-13 ENCOUNTER — Inpatient Hospital Stay (HOSPITAL_COMMUNITY): Payer: 59

## 2017-09-13 LAB — CBC
HCT: 28.7 % — ABNORMAL LOW (ref 39.0–52.0)
Hemoglobin: 9.1 g/dL — ABNORMAL LOW (ref 13.0–17.0)
MCH: 29.2 pg (ref 26.0–34.0)
MCHC: 31.7 g/dL (ref 30.0–36.0)
MCV: 92 fL (ref 78.0–100.0)
PLATELETS: 514 10*3/uL — AB (ref 150–400)
RBC: 3.12 MIL/uL — AB (ref 4.22–5.81)
RDW: 14.3 % (ref 11.5–15.5)
WBC: 10.7 10*3/uL — AB (ref 4.0–10.5)

## 2017-09-13 LAB — GLUCOSE, CAPILLARY
GLUCOSE-CAPILLARY: 100 mg/dL — AB (ref 65–99)
GLUCOSE-CAPILLARY: 155 mg/dL — AB (ref 65–99)
Glucose-Capillary: 124 mg/dL — ABNORMAL HIGH (ref 65–99)
Glucose-Capillary: 167 mg/dL — ABNORMAL HIGH (ref 65–99)
Glucose-Capillary: 100 mg/dL — ABNORMAL HIGH (ref 65–99)
Glucose-Capillary: 96 mg/dL (ref 65–99)

## 2017-09-13 LAB — BASIC METABOLIC PANEL
Anion gap: 9 (ref 5–15)
BUN: 32 mg/dL — AB (ref 6–20)
CALCIUM: 8.3 mg/dL — AB (ref 8.9–10.3)
CO2: 26 mmol/L (ref 22–32)
Chloride: 105 mmol/L (ref 101–111)
Creatinine, Ser: 0.7 mg/dL (ref 0.61–1.24)
GFR calc Af Amer: >60 mL/min (ref >60)
Glucose, Bld: 127 mg/dL — ABNORMAL HIGH (ref 65–99)
POTASSIUM: 4 mmol/L (ref 3.5–5.1)
SODIUM: 140 mmol/L (ref 135–145)

## 2017-09-13 LAB — TRIGLYCERIDES: TRIGLYCERIDES: 72 mg/dL (ref <150)

## 2017-09-13 MED ORDER — ORAL CARE MOUTH RINSE
15.0000 mL | Freq: Two times a day (BID) | OROMUCOSAL | Status: DC
  Administered 2017-09-13 – 2017-09-17 (×5): 15 mL via OROMUCOSAL

## 2017-09-13 NOTE — Addendum Note (Signed)
Addendum  created 09/13/17 1217 by Effie Berkshire, MD   LDA properties accepted

## 2017-09-13 NOTE — Procedures (Signed)
Extubation Procedure Note  Patient Details:   Name: Derrick Sheppard DOB: 02-Sep-1956 MRN: [id number]   Airway Documentation:     Evaluation  O2 sats: stable throughout Complications: No apparent complications Patient did tolerate procedure well. Bilateral Breath Sounds: Clear, Diminished   Yes   Patient extubated to 2L nasal cannula per MD order.  Positive cuff leak noted.  No evidence of stridor.  Patient able to speak post extubation.  Sats currently 97%.  Vitals are stable.  No complications noted.    [name], [name] 09/13/2017, 10:58 AM

## 2017-09-13 NOTE — Progress Notes (Addendum)
Follow up - Trauma Critical Care  Patient Details:    Derrick Sheppard is an 61 y.o. male.  Lines/tubes : Airway 8 mm (Active)  Secured at (cm) 25 cm 09/13/2017  3:10 AM  Measured From Lips 09/13/2017  3:10 AM  Secured Location Right 09/13/2017  3:10 AM  Secured By [employer] 09/13/2017  3:10 AM  Tube Holder Repositioned Yes 09/13/2017  3:10 AM  Cuff Pressure (cm H2O) 26 cm H2O 09/13/2017  3:10 AM  Site Condition Dry 09/13/2017  3:10 AM     CVC Triple Lumen 09/02/17 Right Subclavian (Active)  Indication for Insertion or Continuance of Line Prolonged intravenous therapies 09/12/2017  8:00 PM  Site Assessment Clean;Dry;Intact 09/12/2017  8:00 PM  Proximal Lumen Status Infusing 09/12/2017  8:00 PM  Medial Lumen Status Infusing 09/12/2017  8:00 PM  Distal Lumen Status In-line blood sampling system in place 09/12/2017  8:00 PM  Dressing Type Transparent;Occlusive 09/12/2017  8:00 PM  Dressing Status Clean;Dry;Intact 09/12/2017  8:00 PM  Line Care Distal tubing changed 09/12/2017  6:00 PM  Dressing Intervention Dressing changed;Antimicrobial disc changed 09/09/2017  4:00 AM  Dressing Change Due 09/16/17 09/12/2017  8:00 PM     Epidural Catheter 09/05/17 (Active)  Site Assessment Clean;Dry;Intact 09/12/2017  8:00 PM  Line Status Infusing 09/12/2017  8:00 PM  Dressing Type Transparent;Occlusive 09/12/2017  8:00 PM  Dressing Status Clean;Dry;Intact 09/12/2017  8:00 PM  Dressing Change Due 09/12/17 09/11/2017  8:00 PM     NG/OG Tube Orogastric Center mouth Xray (Active)  External Length of Tube (cm) - (if applicable) 44 cm 09/32/3557  8:00 AM  Site Assessment Clean;Intact 09/12/2017  8:00 PM  Ongoing Placement Verification No change in cm markings or external length of tube from initial placement;No change in respiratory status;No acute changes, not attributed to clinical condition 09/12/2017  8:00 PM  Status Infusing tube feed 09/12/2017  8:00 PM  Drainage Appearance Green 09/05/2017  8:00 PM   Output (mL) 200 mL 09/06/2017  2:00 PM     External Urinary Catheter (Active)  Collection Container Standard drainage bag 09/12/2017  8:00 PM  Output (mL) 200 mL 09/13/2017  5:00 AM    Microbiology/Sepsis markers: Results for orders placed or performed during the hospital encounter of 08/30/17  MRSA PCR Screening     Status: None   Collection Time: 08/31/17  9:05 PM  Result Value Ref Range Status   MRSA by PCR NEGATIVE NEGATIVE Final    Comment:        The GeneXpert MRSA Assay (FDA approved for NASAL specimens only), is one component of a comprehensive MRSA colonization surveillance program. It is not intended to diagnose MRSA infection nor to guide or monitor treatment for MRSA infections.   Culture, respiratory (NON-Expectorated)     Status: None   Collection Time: 09/02/17 11:14 AM  Result Value Ref Range Status   Specimen Description TRACHEAL ASPIRATE  Final   Special Requests NONE  Final   Gram Stain   Final    MODERATE WBC PRESENT, PREDOMINANTLY PMN RARE SQUAMOUS EPITHELIAL CELLS PRESENT ABUNDANT GRAM POSITIVE COCCI IN PAIRS IN CHAINS    Culture Consistent with normal respiratory flora.  Final   Report Status 09/05/2017 FINAL  Final  Culture, blood (Routine X 2) w Reflex to ID Panel     Status: None   Collection Time: 09/02/17 11:30 AM  Result Value Ref Range Status   Specimen Description BLOOD RIGHT ANTECUBITAL  Final   Special Requests IN  PEDIATRIC BOTTLE Blood Culture adequate volume  Final   Culture NO GROWTH 5 DAYS  Final   Report Status 09/07/2017 FINAL  Final  Culture, blood (Routine X 2) w Reflex to ID Panel     Status: None   Collection Time: 09/02/17 11:34 AM  Result Value Ref Range Status   Specimen Description BLOOD RIGHT ANTECUBITAL  Final   Special Requests IN PEDIATRIC BOTTLE Blood Culture adequate volume  Final   Culture NO GROWTH 5 DAYS  Final   Report Status 09/07/2017 FINAL  Final  Culture, respiratory (NON-Expectorated)     Status: None    Collection Time: 09/05/17 11:38 AM  Result Value Ref Range Status   Specimen Description TRACHEAL ASPIRATE  Final   Special Requests Normal  Final   Gram Stain   Final    FEW WBC PRESENT, PREDOMINANTLY MONONUCLEAR NO SQUAMOUS EPITHELIAL CELLS SEEN FEW YEAST WITH PSEUDOHYPHAE RARE GRAM POSITIVE COCCI IN PAIRS IN CLUSTERS    Culture FEW CANDIDA TROPICALIS  Final   Report Status 09/07/2017 FINAL  Final    Anti-infectives:  Anti-infectives (From admission, onward)   Start     Dose/Rate Route Frequency Ordered Stop   09/04/17 1400  cefTRIAXone (ROCEPHIN) 2 g in dextrose 5 % 50 mL IVPB     2 g 100 mL/hr over 30 Minutes Intravenous Every 24 hours 09/04/17 0857 09/13/17 2359   09/04/17 0900  cefTRIAXone (ROCEPHIN) 1 g in dextrose 5 % 50 mL IVPB  Status:  Discontinued     1 g 100 mL/hr over 30 Minutes Intravenous Every 24 hours 09/04/17 0851 09/04/17 0857   09/03/17 1400  cefTAZidime (FORTAZ) 2 g in dextrose 5 % 50 mL IVPB  Status:  Discontinued     2 g 100 mL/hr over 30 Minutes Intravenous Every 8 hours 09/03/17 1309 09/04/17 0851   09/02/17 1400  cefTAZidime (FORTAZ) 2 g in dextrose 5 % 50 mL IVPB  Status:  Discontinued     2 g 100 mL/hr over 30 Minutes Intravenous Every 12 hours 09/02/17 1122 09/03/17 1309   09/02/17 0430  vancomycin (VANCOCIN) IVPB 1000 mg/200 mL premix  Status:  Discontinued     1,000 mg 200 mL/hr over 60 Minutes Intravenous Every 12 hours 09/02/17 0418 09/04/17 0851   09/02/17 0430  ceFEPIme (MAXIPIME) 1 g in dextrose 5 % 50 mL IVPB  Status:  Discontinued     1 g 100 mL/hr over 30 Minutes Intravenous Every 8 hours 09/02/17 0418 09/02/17 1111      Best Practice/Protocols:  VTE Prophylaxis: Lovenox (prophylaxtic dose) Continous Sedation  Consults:     Studies:    Events:  Subjective:    Overnight Issues:   Objective:  Vital signs for last 24 hours: Temp:  [97.7 F (36.5 C)-98.7 F (37.1 C)] 97.8 F (36.6 C) (12/04 0400) Pulse Rate:  [61-99] 66  (12/04 0700) Resp:  [13-26] 23 (12/04 0700) BP: (102-141)/(54-77) 113/66 (12/04 0700) SpO2:  [90 %-100 %] 94 % (12/04 0700) FiO2 (%):  [40 %] 40 % (12/04 0400) Weight:  [76.2 kg (167 lb 15.9 oz)] 76.2 kg (167 lb 15.9 oz) (12/04 0500)  Hemodynamic parameters for last 24 hours: CVP:  [12 mmHg-13 mmHg] 13 mmHg  Intake/Output from previous day: 12/03 0701 - 12/04 0700 In: 2958.7 [I.V.:603.7; NG/GT:2065; IV Piggyback:50] Out: 1540 [Urine:1540]  Intake/Output this shift: No intake/output data recorded.  Vent settings for last 24 hours: Vent Mode: PRVC FiO2 (%):  [40 %] 40 % Set Rate:  [  26 bmp] 26 bmp Vt Set:  [600 mL] 600 mL PEEP:  [5 cmH20] 5 cmH20 Pressure Support:  [5 cmH20] 5 cmH20 Plateau Pressure:  [22 cmH20] 22 cmH20  Physical Exam:  General: alert Neuro: F/C on vent HEENT/Neck: ETT Resp: clear to auscultation bilaterally CVS: RRR GI: mild dist but soft, NT  Results for orders placed or performed during the hospital encounter of 08/30/17 (from the past 24 hour(s))  Glucose, capillary     Status: Abnormal   Collection Time: 09/12/17  8:16 AM  Result Value Ref Range   Glucose-Capillary 155 (H) 65 - 99 mg/dL  Glucose, capillary     Status: Abnormal   Collection Time: 09/12/17 11:51 AM  Result Value Ref Range   Glucose-Capillary 106 (H) 65 - 99 mg/dL   Comment 1 Notify RN    Comment 2 Document in Chart   Glucose, capillary     Status: Abnormal   Collection Time: 09/12/17  4:00 PM  Result Value Ref Range   Glucose-Capillary 158 (H) 65 - 99 mg/dL  Glucose, capillary     Status: Abnormal   Collection Time: 09/12/17  8:11 PM  Result Value Ref Range   Glucose-Capillary 102 (H) 65 - 99 mg/dL  Glucose, capillary     Status: Abnormal   Collection Time: 09/12/17 11:14 PM  Result Value Ref Range   Glucose-Capillary 114 (H) 65 - 99 mg/dL  Glucose, capillary     Status: Abnormal   Collection Time: 09/13/17  3:05 AM  Result Value Ref Range   Glucose-Capillary 124 (H) 65 -  99 mg/dL  CBC     Status: Abnormal   Collection Time: 09/13/17  5:15 AM  Result Value Ref Range   WBC 10.7 (H) 4.0 - 10.5 K/uL   RBC 3.12 (L) 4.22 - 5.81 MIL/uL   Hemoglobin 9.1 (L) 13.0 - 17.0 g/dL   HCT 28.7 (L) 39.0 - 52.0 %   MCV 92.0 78.0 - 100.0 fL   MCH 29.2 26.0 - 34.0 pg   MCHC 31.7 30.0 - 36.0 g/dL   RDW 14.3 11.5 - 15.5 %   Platelets 514 (H) 150 - 400 K/uL  Basic metabolic panel     Status: Abnormal   Collection Time: 09/13/17  5:15 AM  Result Value Ref Range   Sodium 140 135 - 145 mmol/L   Potassium 4.0 3.5 - 5.1 mmol/L   Chloride 105 101 - 111 mmol/L   CO2 26 22 - 32 mmol/L   Glucose, Bld 127 (H) 65 - 99 mg/dL   BUN 32 (H) 6 - 20 mg/dL   Creatinine, Ser 0.70 0.61 - 1.24 mg/dL   Calcium 8.3 (L) 8.9 - 10.3 mg/dL   GFR calc non Af Amer >60 >60 mL/min   GFR calc Af Amer >60 >60 mL/min   Anion gap 9 5 - 15  Triglycerides     Status: None   Collection Time: 09/13/17  5:15 AM  Result Value Ref Range   Triglycerides 72 <150 mg/dL    Assessment & Plan: Present on Admission: . Multiple rib fractures    LOS: 13 days   Additional comments:I reviewed the patient's new clinical lab test results. . Fall from ladder 10 feet Multiple right rib fractures.-multimodal pain control, epidural Right pneumothorax-pigtail removed 11/27 VDRF- failed extubation 11/30 due to stridor, solumedrol, wean today with hope to try extubation COPD and tobacco abuse - Continue Spiriva and Dulera; Duonebs. Guaifenesin prn for thick secretions FEN - free water  VTE - lovenox, will need to hold today for removal of epidural ID - ceftriaxone completing today Acute urinary retention - now condom cath Dispo: ICU Critical Care Total Time*: 30 Minutes  Georganna Skeans, MD, MPH, FACS Trauma: [phone] General Surgery: [phone]  09/13/2017  *Care during the described time interval was provided by me. I have reviewed this patient's available data, including medical history, events of  note, physical examination and test results as part of my evaluation.  Patient ID: ELIAZER HEMPHILL, male   DOB: 1956/08/20, 61 y.o.   MRN: [id number]

## 2017-09-13 NOTE — Addendum Note (Signed)
Addendum  created 09/13/17 1215 by Effie Berkshire, MD   Sign clinical note

## 2017-09-13 NOTE — Anesthesia Post-op Follow-up Note (Signed)
  Anesthesia Pain Follow-up Note  Patient: Derrick Sheppard  Day #: 8  Date of Follow-up: 09/13/2017 Time: 12:14 PM  Last Vitals:  Vitals:   09/13/17 1100 09/13/17 1200  BP: 126/71 127/64  Pulse: 68 74  Resp: 17 18  Temp:    SpO2: 97% 94%    Level of Consciousness: alert  Pain: mild   Side Effects:None  Catheter Site Exam:clean, dry, no drainage  Anti-Coag Meds (From admission, onward)   Start     Dose/Rate Route Frequency Ordered Stop   09/07/17 1800  enoxaparin (LOVENOX) injection 40 mg     40 mg Subcutaneous Every 24 hours 09/07/17 1614      Epidural / Intrathecal (From admission, onward)   Start     Dose/Rate Route Frequency Ordered Stop   09/05/17 2015  ropivacaine (PF) 2 mg/mL (0.2%) (NAROPIN) injection     10 mL/hr 10 mL/hr  Epidural Continuous 09/05/17 2002 09/14/17 0915     Epidural removed, tip intact.   Plan: D/C Infusion at surgeon's request and D/C from anesthesia care at surgeon's request  Effie Berkshire

## 2017-09-13 NOTE — Progress Notes (Signed)
Occupational Therapy Evaluation Patient Details Name: Derrick Sheppard MRN: [id number] DOB: 1955/11/25 Today's Date: 09/13/2017    History of Present Illness 61 yo admitted after fall from ladder with right rib fx, PTX, intubated 11/23 and chest tube placed, epidural cath placed 11/27. Extubated and epidural catheter removed 09/13/17. PMHx: COPD, CAD, GERD   Clinical Impression   PTA, pt lived at home with wife, was independent with ADL and mobility and enjoyed working on cars. Pt currently requires Mod A with stand pivot transfers and ADL tasks due to below listed deficits. Pt is oriented to situation (states he was hanging Christmas lights when his ladder gave out), but thinks it is 2019 and that he is at [facility]. At this time recommend CIR for rehab. Pt is very motivated to return to PLOF and wife is able to provide 24/7 S after DC. VSS during session (HR 73 - 102; O2 93 on 2L; RR 18-22; BP 134/80 sitting). Will follow acutely to address established goals and facilitate DC to next venue of care.      Follow Up Recommendations  CIR;Supervision/Assistance - 24 hour    Equipment Recommendations  3 in 1 bedside commode;Other (comment)(RW)    Recommendations for Other Services Rehab consult     Precautions / Restrictions Precautions Precautions: Fall Restrictions Weight Bearing Restrictions: No      Mobility Bed Mobility Overal bed mobility: Needs Assistance Bed Mobility: Supine to Sit Rolling: Mod assist           Transfers Overall transfer level: Needs assistance   Transfers: Sit to/from Stand Sit to Stand: Mod assist              Balance Overall balance assessment: Needs assistance   Sitting balance-Leahy Scale: Fair      Standing balance-Leahy Scale: Poor Standing balance comment: reliant on UE support                           ADL either performed or assessed with clinical judgement   ADL Overall ADL's : Needs assistance/impaired      Grooming: Minimal assistance;Sitting   Upper Body Bathing: Minimal assistance;Sitting   Lower Body Bathing: Moderate assistance;Sit to/from stand   Upper Body Dressing : Moderate assistance;Sitting   Lower Body Dressing: Moderate assistance;Sit to/from stand   Toilet Transfer: Moderate assistance;Stand-pivot   Toileting- Clothing Manipulation and Hygiene: Moderate assistance;Sit to/from stand       Functional mobility during ADLs: Moderate assistance(for stand pivot. Will need +2 for ambulation )       Vision Baseline Vision/History: Wears glasses Wears Glasses: Reading only       Perception     Praxis      Pertinent Vitals/Pain Pain Assessment: 0-10 Pain Score: 6  Pain Descriptors / Indicators: Aching;Discomfort Pain Intervention(s): Limited activity within patient's tolerance     Hand Dominance Right   Extremity/Trunk Assessment Upper Extremity Assessment Upper Extremity Assessment: Generalized weakness   Lower Extremity Assessment Lower Extremity Assessment: Generalized weakness   Cervical / Trunk Assessment Cervical / Trunk Assessment: Kyphotic   Communication Communication Communication: Other (comment)(soft voice)   Cognition Arousal/Alertness: Awake/alert Behavior During Therapy: WFL for tasks assessed/performed Overall Cognitive Status: Impaired/Different from baseline Area of Impairment: Orientation;Memory;Safety/judgement;Awareness                 Orientation Level: Disoriented to;Time;Place(thinks it is 2019 and is at [facility])   Memory: Decreased short-term memory   Safety/Judgement: Decreased awareness of safety;Decreased  awareness of deficits Awareness: Emergent   General Comments: "Guess I can't go home today"   General Comments       Exercises     Shoulder Instructions      Home Living Family/patient expects to be discharged to:: Private residence Living Arrangements: Spouse/significant other Available Help at Discharge:  Available 24 hours/day Type of Home: House Home Access: Stairs to enter [employer] of Steps: 4 Entrance Stairs-Rails: Right Home Layout: One level     Bathroom Shower/Tub: Corporate investment banker: Standard Bathroom Accessibility: Yes How Accessible: Accessible via walker Home Equipment: None          Prior Functioning/Environment Level of Independence: Independent        Comments: enjoys working on cars; "my hands have to be busy"        OT Problem List: Decreased strength;Decreased activity tolerance;Impaired balance (sitting and/or standing);Decreased cognition;Decreased safety awareness;Decreased knowledge of use of DME or AE;Decreased knowledge of precautions;Cardiopulmonary status limiting activity;Pain      OT Treatment/Interventions: Self-care/ADL training;Therapeutic exercise;Energy conservation;DME and/or AE instruction;Therapeutic activities;Cognitive remediation/compensation;Patient/family education;Balance training    OT Goals(Current goals can be found in the care plan section) Acute Rehab OT Goals Patient Stated Goal: to go home and get back to working on cars OT Goal Formulation: With patient Time For Goal Achievement: 09/27/17 Potential to Achieve Goals: Good  OT Frequency: Min 2X/week   Barriers to D/C:            Co-evaluation              AM-PAC PT "6 Clicks" Daily Activity     Outcome Measure Help from another person eating meals?: A Little Help from another person taking care of personal grooming?: A Little Help from another person toileting, which includes using toliet, bedpan, or urinal?: A Lot Help from another person bathing (including washing, rinsing, drying)?: A Lot Help from another person to put on and taking off regular upper body clothing?: A Lot Help from another person to put on and taking off regular lower body clothing?: A Lot 6 Click Score: 14   End of Session Equipment Utilized During  Treatment: Gait belt;Oxygen(2L) Nurse Communication: Mobility status  Activity Tolerance: Patient tolerated treatment well Patient left: in chair;with call bell/phone within reach;with chair alarm set;with family/visitor present  OT Visit Diagnosis: Unsteadiness on feet (R26.81);Muscle weakness (generalized) (M62.81);Other symptoms and signs involving cognitive function;Pain Pain - Right/Left: Right Pain - part of body: (ribs)                Time: 7517-0017 OT Time Calculation (min): 29 min Charges:  OT General Charges $OT Visit: 1 Visit OT Evaluation $OT Eval Moderate Complexity: 1 Mod OT Treatments $Self Care/Home Management : 8-22 mins G-Codes:     [facility], OT/L  [phone] 09/13/2017  Harli Engelken,HILLARY 09/13/2017, 4:31 PM

## 2017-09-13 NOTE — Progress Notes (Signed)
Patient ID: Derrick Sheppard, male   DOB: 06-10-1956, 61 y.o.   MRN: [id number] Extubated, doing well so far. Anesthesia will remove epidural. I spoke with his wife.  Georganna Skeans, MD, MPH, FACS Trauma: [phone] General Surgery: [phone]

## 2017-09-14 DIAGNOSIS — J939 Pneumothorax, unspecified: Secondary | ICD-10-CM

## 2017-09-14 DIAGNOSIS — S270XXD Traumatic pneumothorax, subsequent encounter: Secondary | ICD-10-CM

## 2017-09-14 DIAGNOSIS — D62 Acute posthemorrhagic anemia: Secondary | ICD-10-CM

## 2017-09-14 DIAGNOSIS — J189 Pneumonia, unspecified organism: Secondary | ICD-10-CM

## 2017-09-14 DIAGNOSIS — R52 Pain, unspecified: Secondary | ICD-10-CM

## 2017-09-14 DIAGNOSIS — S299XXA Unspecified injury of thorax, initial encounter: Secondary | ICD-10-CM

## 2017-09-14 DIAGNOSIS — R0682 Tachypnea, not elsewhere classified: Secondary | ICD-10-CM

## 2017-09-14 DIAGNOSIS — W11XXXD Fall on and from ladder, subsequent encounter: Secondary | ICD-10-CM

## 2017-09-14 DIAGNOSIS — R339 Retention of urine, unspecified: Secondary | ICD-10-CM

## 2017-09-14 DIAGNOSIS — Z720 Tobacco use: Secondary | ICD-10-CM

## 2017-09-14 DIAGNOSIS — J439 Emphysema, unspecified: Secondary | ICD-10-CM

## 2017-09-14 DIAGNOSIS — S299XXD Unspecified injury of thorax, subsequent encounter: Secondary | ICD-10-CM

## 2017-09-14 DIAGNOSIS — S2241XD Multiple fractures of ribs, right side, subsequent encounter for fracture with routine healing: Secondary | ICD-10-CM

## 2017-09-14 DIAGNOSIS — J449 Chronic obstructive pulmonary disease, unspecified: Secondary | ICD-10-CM

## 2017-09-14 LAB — GLUCOSE, CAPILLARY
GLUCOSE-CAPILLARY: 102 mg/dL — AB (ref 65–99)
GLUCOSE-CAPILLARY: 102 mg/dL — AB (ref 65–99)
GLUCOSE-CAPILLARY: 92 mg/dL (ref 65–99)
Glucose-Capillary: 116 mg/dL — ABNORMAL HIGH (ref 65–99)
Glucose-Capillary: 112 mg/dL — ABNORMAL HIGH (ref 65–99)
Glucose-Capillary: 117 mg/dL — ABNORMAL HIGH (ref 65–99)

## 2017-09-14 MED ORDER — OXYCODONE HCL 5 MG PO TABS
5.0000 mg | ORAL_TABLET | ORAL | Status: DC | PRN
  Administered 2017-09-14 – 2017-09-15 (×7): 10 mg via ORAL
  Administered 2017-09-16: 5 mg via ORAL
  Administered 2017-09-16 – 2017-09-17 (×5): 10 mg via ORAL
  Filled 2017-09-14: qty 2
  Filled 2017-09-14: qty 1
  Filled 2017-09-14 (×12): qty 2

## 2017-09-14 MED ORDER — TRAMADOL HCL 50 MG PO TABS
50.0000 mg | ORAL_TABLET | Freq: Four times a day (QID) | ORAL | Status: DC
  Administered 2017-09-14 – 2017-09-17 (×11): 50 mg via ORAL
  Filled 2017-09-14 (×12): qty 1

## 2017-09-14 MED ORDER — OXYCODONE HCL 5 MG PO TABS: 5.0000 mg | ORAL_TABLET | ORAL | Status: DC | PRN

## 2017-09-14 MED ORDER — ENSURE ENLIVE PO LIQD
237.0000 mL | Freq: Two times a day (BID) | ORAL | Status: DC
  Administered 2017-09-14 – 2017-09-17 (×4): 237 mL via ORAL

## 2017-09-14 MED ORDER — CHLORHEXIDINE GLUCONATE 0.12 % MT SOLN
OROMUCOSAL | Status: AC
  Filled 2017-09-14: qty 15

## 2017-09-14 MED ORDER — PANTOPRAZOLE SODIUM 40 MG PO TBEC
40.0000 mg | DELAYED_RELEASE_TABLET | Freq: Every day | ORAL | Status: DC
  Administered 2017-09-14 – 2017-09-17 (×4): 40 mg via ORAL
  Filled 2017-09-14 (×4): qty 1

## 2017-09-14 MED ORDER — CHLORHEXIDINE GLUCONATE CLOTH 2 % EX PADS: 6.0000 | MEDICATED_PAD | Freq: Every day | CUTANEOUS | Status: DC

## 2017-09-14 NOTE — Progress Notes (Signed)
Physical Therapy Treatment Patient Details Name: Derrick Sheppard MRN: [id number] DOB: 01-05-1956 Today's Date: 09/14/2017    History of Present Illness 61 yo admitted after fall from ladder with right rib fx, PTX, intubated 11/23 and chest tube placed, epidural cath placed 11/27. Extubated and epidural catheter removed 09/13/17. PMHx: COPD, CAD, GERD    PT Comments    Patient seen for activity progression. While patient continues to show deficits in functional mobility as indicated below patient does now show improved overall activity tolerance and was able to initiate ambulation during today's session.  Patient with noted instability requiring assist to prevent fall but was showing good receptivity to cues from therapist. Given patients prior level of independence, continue to feel patient will benefit from CIR to return to prior level of function. OF NOTE: ambulated patient on room air with saturations remaining stable today >90%. Patient is eager to progress and return home. Will see as indicated and progress as tolerated.     Follow Up Recommendations  CIR;Supervision/Assistance - 24 hour     Equipment Recommendations  Other (comment)(TBD with progression)    Recommendations for Other Services OT consult     Precautions / Restrictions Precautions Precautions: Fall    Mobility  Bed Mobility Overal bed mobility: Needs Assistance Bed Mobility: Rolling;Supine to Sit;Sit to Supine Rolling: Min guard   Supine to sit: Min guard Sit to supine: Min guard   General bed mobility comments: Min guard for bed mobility, increased time required but no physical assist needed.  Transfers Overall transfer level: Needs assistance Equipment used: Rolling walker (2 wheeled) Transfers: Sit to/from Stand Sit to Stand: Min assist         General transfer comment: min assist for stability, initial posterior LOB patient with difficulty establishing midline orientation over base of support.  Improved with bilateral UE support on RW  Ambulation/Gait Ambulation/Gait assistance: Min assist;Mod assist(increased assist with HHA, Min assist with RW) Ambulation Distance (Feet): 65 Feet(x2) Assistive device: Rolling walker (2 wheeled) Gait Pattern/deviations: Drifts right/left;Staggering right;Narrow base of support;Decreased stride length Gait velocity: decreased Gait velocity interpretation: Below normal speed for age/gender General Gait Details: patient with noted instability during ambulation requiring assist to maintain upright and use of bilateral UE support. Patient lists to the right with several noted LOB. some evidence of self correction but at other times required manual assist to correct. Attempted 47' without RW requiring increased physical assist.    Stairs            Wheelchair Mobility    Modified Rankin (Stroke Patients Only)       Balance Overall balance assessment: Needs assistance   Sitting balance-Leahy Scale: Fair Sitting balance - Comments: able to sit EOB without external physical assist   Standing balance support: Bilateral upper extremity supported Standing balance-Leahy Scale: Poor Standing balance comment: posterior and right list requires assist or UE support                            Cognition Arousal/Alertness: Awake/alert Behavior During Therapy: Flat affect Overall Cognitive Status: Impaired/Different from baseline                       Memory: Decreased short-term memory   Safety/Judgement: Decreased awareness of safety;Decreased awareness of deficits            Exercises      General Comments        Pertinent  Vitals/Pain Pain Assessment: Faces Faces Pain Scale: Hurts a little bit Pain Intervention(s): Monitored during session    Home Living                      Prior Function            PT Goals (current goals can now be found in the care plan section) Acute Rehab PT  Goals Patient Stated Goal: to go home PT Goal Formulation: With patient Time For Goal Achievement: 09/22/17 Potential to Achieve Goals: Good Progress towards PT goals: Progressing toward goals    Frequency    Min 3X/week      PT Plan Current plan remains appropriate    Co-evaluation              AM-PAC PT "6 Clicks" Daily Activity  Outcome Measure  Difficulty turning over in bed (including adjusting bedclothes, sheets and blankets)?: None Difficulty moving from lying on back to sitting on the side of the bed? : A Lot Difficulty sitting down on and standing up from a chair with arms (e.g., wheelchair, bedside commode, etc,.)?: Unable Help needed moving to and from a bed to chair (including a wheelchair)?: A Little Help needed walking in hospital room?: A Little Help needed climbing 3-5 steps with a railing? : A Lot 6 Click Score: 15    End of Session Equipment Utilized During Treatment: Gait belt Activity Tolerance: Patient tolerated treatment well Patient left: in bed;with call bell/phone within reach;with nursing/sitter in room Nurse Communication: Mobility status;Precautions PT Visit Diagnosis: Other abnormalities of gait and mobility (R26.89);Difficulty in walking, not elsewhere classified (R26.2);Muscle weakness (generalized) (M62.81)     Time: 6384-6659 PT Time Calculation (min) (ACUTE ONLY): 21 min  Charges:  $Gait Training: 8-22 mins                    G Codes:       Alben Deeds, PT DPT  Board Certified Neurologic Specialist [place] 09/14/2017, 5:18 PM

## 2017-09-14 NOTE — Progress Notes (Signed)
Inpatient Rehabilitation  Patient was re-screened by Gunnar Fusi for appropriateness for an Inpatient Acute Rehab consult.  At this time note patient is extubated and therapies are continuing to recommend CIR.  We are recommending an Inpatient Rehab consult.  Notified medical team; please order if you are agreeable.    Carmelia Roller., CCC/SLP Admission Coordinator  [facility]  Cell [phone]

## 2017-09-14 NOTE — Progress Notes (Signed)
Nutrition Follow-up  INTERVENTION:   Ensure Enlive po BID, each supplement provides 350 kcal and 20 grams of protein  NUTRITION DIAGNOSIS:   Increased nutrient needs related to wound healing as evidenced by estimated needs. Ongoing.   GOAL:   Patient will meet greater than or equal to 90% of their needs  Progressing.   MONITOR:   PO intake, Supplement acceptance, Weight trends  ASSESSMENT:   Pt with PMH of COPD, CAD, and GERD who was admitted to [facility] 11/20 after fall from 10 ft ladder. He was found to have multiple right sided rib fractures and a small pneumothorax. He was admitted to floor by trauma service for monitoring. He developed hypoxemia was unable to use IS due to pain. He was transferred to SDU for BiPAP. Then developed lethargy. CT scan of the chest demonstrated worsening PTX on 11/23 and chest tube was placed. He was transferred to ICU and intubated upon arrival.  Pt discussed during ICU rounds and with RN.   Pt up in chair, wife at bedside. Pt reports good appetite PTA but is decreased now. Per pt ate < 50% of his breakfast.  Pt is willing to drink chocolate ensure. Encouraged using a cup with a lid and straw.   Medications reviewed and include: colace, miralax Labs reviewed  CBG's: [id number]    Diet Order:  Diet Heart Room service appropriate? Yes; Fluid consistency: Thin  EDUCATION NEEDS:   No education needs have been identified at this time  Skin:  Skin Assessment: Reviewed RN Assessment  Last BM:  12/4  Height:   Ht Readings from Last 1 Encounters:  09/02/17 5\' 7"  (1.702 m)    Weight:   Wt Readings from Last 1 Encounters:  09/14/17 159 lb 13.3 oz (72.5 kg)    Ideal Body Weight:  67.2 kg  BMI:  Body mass index is 25.03 kg/m.  Estimated Nutritional Needs:   Kcal:  1800-2000  Protein:  90-115 grams  Fluid:  > 1.8 L/day  Maylon Peppers RD, LDN, CNSC [phone] Pager [phone] After Hours Pager

## 2017-09-14 NOTE — Progress Notes (Signed)
Subjective: Up in chair, did fine taking PO, RN reports was a little confused overnight but this resolved  Objective: Vital signs in last 24 hours: Temp:  [98.2 F (36.8 C)-98.8 F (37.1 C)] 98.8 F (37.1 C) (12/05 0400) Pulse Rate:  [65-78] 71 (12/05 0700) Resp:  [12-26] 19 (12/05 0700) BP: (116-172)/(59-96) 143/75 (12/05 0700) SpO2:  [87 %-97 %] 93 % (12/05 0807) Weight:  [72.5 kg (159 lb 13.3 oz)] 72.5 kg (159 lb 13.3 oz) (12/05 0500) Last BM Date: 09/13/17  Intake/Output from previous day: 12/04 0701 - 12/05 0700 In: 372.6 [P.O.:120; I.V.:143.8; NG/GT:68.8] Out: 2425 [Urine:2425] Intake/Output this shift: No intake/output data recorded.  General appearance: alert and cooperative Resp: clear to auscultation bilaterally Chest wall: right sided chest wall tenderness Cardio: regular rate and rhythm GI: soft, NT, mild dist Neuro: alert, F/C  Lab Results: CBC  Recent Labs    09/13/17 0515  WBC 10.7*  HGB 9.1*  HCT 28.7*  PLT 514*   BMET Recent Labs    09/13/17 0515  NA 140  K 4.0  CL 105  CO2 26  GLUCOSE 127*  BUN 32*  CREATININE 0.70  CALCIUM 8.3*   PT/INR No results for input(s): LABPROT, INR in the last 72 hours. ABG No results for input(s): PHART, HCO3 in the last 72 hours.  Invalid input(s): PCO2, PO2  Studies/Results: Dg Chest Port 1 View  Result Date: 09/13/2017 CLINICAL DATA:  62 year old male status post fall from 10 feet with multiple rib fractures, pneumothorax. Failed extubation on 09/09/2017. EXAM: PORTABLE CHEST 1 VIEW COMPARISON:  09/11/2017 and earlier. FINDINGS: Portable AP semi upright view at 0553 hours. Endotracheal tube tip is just below the clavicles. Enteric tube courses to the left upper quadrant, tip not included. Stable right subclavian central line. Trace if any residual right apical pneumothorax today. Possible small right pleural effusion. Coarse bilateral interstitial pulmonary opacity with basilar predominance persists  and appears chronic to a degree. Mildly increased retrocardiac opacity on the left since yesterday. No other areas of worsening ventilation. Stable cardiac size and mediastinal contours. Lateral right mid to lower rib fracture again noted. IMPRESSION: 1.  Stable lines and tubes. 2. Regressed small right apical pneumothorax since yesterday. Trace if any residual. 3. Increased opacity at the left lung base, favor atelectasis. Possible small right pleural effusion. Suspected underlying chronic interstitial lung disease. Electronically Signed   By: Genevie Ann M.D.   On: 09/13/2017 08:46    Anti-infectives: Anti-infectives (From admission, onward)   Start     Dose/Rate Route Frequency Ordered Stop   09/04/17 1400  cefTRIAXone (ROCEPHIN) 2 g in dextrose 5 % 50 mL IVPB     2 g 100 mL/hr over 30 Minutes Intravenous Every 24 hours 09/04/17 0857 09/13/17 1821   09/04/17 0900  cefTRIAXone (ROCEPHIN) 1 g in dextrose 5 % 50 mL IVPB  Status:  Discontinued     1 g 100 mL/hr over 30 Minutes Intravenous Every 24 hours 09/04/17 0851 09/04/17 0857   09/03/17 1400  cefTAZidime (FORTAZ) 2 g in dextrose 5 % 50 mL IVPB  Status:  Discontinued     2 g 100 mL/hr over 30 Minutes Intravenous Every 8 hours 09/03/17 1309 09/04/17 0851   09/02/17 1400  cefTAZidime (FORTAZ) 2 g in dextrose 5 % 50 mL IVPB  Status:  Discontinued     2 g 100 mL/hr over 30 Minutes Intravenous Every 12 hours 09/02/17 1122 09/03/17 1309   09/02/17 0430  vancomycin (VANCOCIN) IVPB  1000 mg/200 mL premix  Status:  Discontinued     1,000 mg 200 mL/hr over 60 Minutes Intravenous Every 12 hours 09/02/17 0418 09/04/17 0851   09/02/17 0430  ceFEPIme (MAXIPIME) 1 g in dextrose 5 % 50 mL IVPB  Status:  Discontinued     1 g 100 mL/hr over 30 Minutes Intravenous Every 8 hours 09/02/17 0418 09/02/17 1111      Assessment/Plan: Fall from ladder 10 feet Multiple right rib fractures.-multimodal pain control, epidural out Right pneumothorax-pigtail removed  11/27 Acute hypoxic resp failure - doing well since extubation COPD and tobacco abuse - Continue Spiriva and Dulera; Duonebs. Guaifenesin prn for thick secretions FEN - diet, D/C central line VTE - lovenox Dispo: to SDU, PT/OT    LOS: 14 days    Georganna Skeans, MD, MPH, FACS Trauma: [phone] General Surgery: [phone]  12/5/2018Patient ID: Derrick Sheppard, male   DOB: 1955/12/04, 61 y.o.   MRN: [id number]

## 2017-09-14 NOTE — Progress Notes (Signed)
I met with pt,his wife and his RN at bedside to discuss goals and expectations of an inpt rehab admission. Patient expressed wanting to get home as soon as possible. After much discussion, he agreed to let me pursue [facility] approval for a possible admission. I will initiate authorization and follow up tomorrow with their decision. [phone]

## 2017-09-14 NOTE — Consult Note (Signed)
Physical Medicine and Rehabilitation Consult Reason for Consult: Decreased functional mobility Referring Physician: Trauma   HPI: Derrick Sheppard is a 61 y.o. right handed male with history of COPD/tobacco abuse, CAD maintained on aspirin. Per chart review, patient, and wife, patient lives with spouse independent prior to admission. Wife can assist as needed. Presented 08/30/2017 after a fall from a ladder approximately 10 feet landing on his right side. Cranial CT reviewed, negative for acute process. CT cervical spine negative for fracture. CT of chest abdomen and pelvis showed fractures of the right eighth and ninth 10th and 11th ribs. Small right pneumothorax. Incidental findings of a 3 mm nonspecific right lung nodules recommendations of follow-up CT of the chest 3-6 months. Patient developed hypoxemia followed by critical care medicine required intubation as well as chest tube. He was extubated 09/13/2017. Hospital course pain management. Acute blood loss anemia 9.1. Presently remains on Rocephin for CAP. Diet has been advanced to regular consistency. Bouts of urinary retention currently on Urecholine.   Review of Systems  Constitutional: Negative for chills and fever.  HENT: Negative for hearing loss.   Eyes: Negative for blurred vision and double vision.  Respiratory: Positive for cough. Negative for shortness of breath.   Cardiovascular: Negative for chest pain.  Gastrointestinal: Positive for constipation. Negative for nausea and vomiting.       GERD  Genitourinary: Positive for urgency. Negative for dysuria, flank pain and hematuria.  Musculoskeletal: Positive for myalgias.  Skin: Negative for rash.  Neurological: Positive for headaches. Negative for seizures.  All other systems reviewed and are negative.  Past Medical History:  Diagnosis Date  . Allergic state   . Arthritis   . Asthma   . B12 deficiency   . COPD (chronic obstructive pulmonary disease) ([place])   .  Coronary artery disease   . Dysrhythmia   . Edema   . GERD (gastroesophageal reflux disease)   . Headache   . Palpitations   . Pulmonary nodules    Past Surgical History:  Procedure Laterality Date  . COLONOSCOPY    . COLONOSCOPY WITH PROPOFOL N/A 04/09/2016   Procedure: COLONOSCOPY WITH PROPOFOL;  Surgeon: Lollie Sails, MD;  Location: [facility] ENDOSCOPY;  Service: Endoscopy;  Laterality: N/A;   No pertinent family history of trauma. Social History:  reports that he has been smoking cigarettes.  He has a 42.00 pack-year smoking history. he has never used smokeless tobacco. He reports that he does not drink alcohol or use drugs. Allergies:  Allergies  Allergen Reactions  . Amlodipine Swelling    Makes his ankles and legs swell.  . Codeine Other (See Comments)    Gives him a Headache.   Medications Prior to Admission  Medication Sig Dispense Refill  . albuterol (PROVENTIL HFA;VENTOLIN HFA) 108 (90 Base) MCG/ACT inhaler Inhale 2 puffs into the lungs every 6 (six) hours as needed for wheezing or shortness of breath.    Marland Kitchen aspirin EC 81 MG tablet Take 81 mg by mouth daily.    . B Complex-C (B-COMPLEX WITH VITAMIN C) tablet Take 1 tablet by mouth daily.    . Cholecalciferol 1000 units TBDP Take 1,000 Units/day by mouth daily.    Marland Kitchen esomeprazole (NEXIUM) 40 MG capsule Take 40 mg by mouth daily at 12 noon.    . Fluticasone-Salmeterol (ADVAIR) 250-50 MCG/DOSE AEPB Inhale 1 puff into the lungs 2 (two) times daily.    Marland Kitchen ibuprofen (ADVIL,MOTRIN) 200 MG tablet Take 600 mg by mouth  every 4 (four) hours as needed.     Marland Kitchen losartan (COZAAR) 50 MG tablet Take 50 mg by mouth daily.    Marland Kitchen tiotropium (SPIRIVA) 18 MCG inhalation capsule Place 18 mcg into inhaler and inhale daily.      Home: Home Living Family/patient expects to be discharged to:: Private residence Living Arrangements: Spouse/significant other Available Help at Discharge: Available 24 hours/day Type of Home: House Home Access: Stairs  to enter [employer] of Steps: 4 Entrance Stairs-Rails: Right Home Layout: One level Bathroom Shower/Tub: Tub/shower unit, Architectural technologist: Standard Bathroom Accessibility: Yes Home Equipment: None Additional Comments: pt unable to provide PLOF or home setup but able to nod yes to basic questions  Functional History: Prior Function Level of Independence: Independent Comments: enjoys working on cars; "my hands have to be busy" Functional Status:  Mobility: Bed Mobility Overal bed mobility: Needs Assistance Bed Mobility: Supine to Sit Rolling: Mod assist Sidelying to sit: Min assist, HOB elevated Supine to sit: Mod assist General bed mobility comments: cues for sequence with assist to roll bil and pt bending knee and reaching to assist with rolling. Assist to elevate trunk and bring legs off of surface as well as to return legs and trunk to surface Transfers Overall transfer level: Needs assistance Transfers: Sit to/from Stand Sit to Stand: Mod assist General transfer comment: max assist with bil knees blocked to provide anterior translation and rise from surface with ability to stand and side step toward [facility] 3' with bil UE support and maintained knee blocking due to buckling Ambulation/Gait General Gait Details: unable    ADL: ADL Overall ADL's : Needs assistance/impaired Grooming: Minimal assistance, Sitting Upper Body Bathing: Minimal assistance, Sitting Lower Body Bathing: Moderate assistance, Sit to/from stand Upper Body Dressing : Moderate assistance, Sitting Lower Body Dressing: Moderate assistance, Sit to/from stand Toilet Transfer: Moderate assistance, Stand-pivot Toileting- Clothing Manipulation and Hygiene: Moderate assistance, Sit to/from stand Functional mobility during ADLs: Moderate assistance(for stand pivot. Will need +2 for ambulation )  Cognition: Cognition Overall Cognitive Status: Impaired/Different from baseline Orientation Level:  Oriented X4, Oriented to person, Oriented to place, Oriented to time Cognition Arousal/Alertness: Awake/alert Behavior During Therapy: WFL for tasks assessed/performed Overall Cognitive Status: Impaired/Different from baseline Area of Impairment: Orientation, Memory, Safety/judgement, Awareness Orientation Level: Disoriented to, Time, Place(thinks it is 2019 and is at [facility]) Memory: Decreased short-term memory Safety/Judgement: Decreased awareness of safety, Decreased awareness of deficits Awareness: Emergent General Comments: "Guess I can't go home today" Difficult to assess due to: Intubated  Blood pressure 128/69, pulse 73, temperature (!) 97.3 F (36.3 C), temperature source Oral, resp. rate (!) 23, height 5\' 7"  (1.702 m), weight 72.5 kg (159 lb 13.3 oz), SpO2 95 %. Physical Exam  Vitals reviewed. Constitutional: He is oriented to person, place, and time. He appears well-developed and well-nourished.  HENT:  Head: Normocephalic and atraumatic.  Weak cough  Eyes: EOM are normal. Right eye exhibits no discharge. Left eye exhibits no discharge.  Neck: Normal range of motion. Neck supple. No thyromegaly present.  Cardiovascular: Normal rate and regular rhythm.  Respiratory:  Limited inspiratory effort but clear to auscultation +[place]  GI: Soft. Bowel sounds are normal. He exhibits no distension.  Musculoskeletal: He exhibits no edema or tenderness.  Neurological: He is alert and oriented to person, place, and time.  Alert.  Provides his name and age follow simple commands Motor: 4/5 grossly throughout Sensation intact to light touch Dysphonia  Skin: Skin is warm and dry.  Chest  tube site is dressed.  Psychiatric: He has a normal mood and affect. His behavior is normal.    Results for orders placed or performed during the hospital encounter of 08/30/17 (from the past 24 hour(s))  Glucose, capillary     Status: Abnormal   Collection Time: 09/13/17 12:13 PM  Result Value Ref Range     Glucose-Capillary 100 (H) 65 - 99 mg/dL   Comment 1 Notify RN    Comment 2 Document in Chart   Glucose, capillary     Status: Abnormal   Collection Time: 09/13/17  3:51 PM  Result Value Ref Range   Glucose-Capillary 155 (H) 65 - 99 mg/dL  Glucose, capillary     Status: None   Collection Time: 09/13/17  8:29 PM  Result Value Ref Range   Glucose-Capillary 96 65 - 99 mg/dL  Glucose, capillary     Status: Abnormal   Collection Time: 09/13/17 11:33 PM  Result Value Ref Range   Glucose-Capillary 100 (H) 65 - 99 mg/dL  Glucose, capillary     Status: Abnormal   Collection Time: 09/14/17  3:57 AM  Result Value Ref Range   Glucose-Capillary 116 (H) 65 - 99 mg/dL  Glucose, capillary     Status: Abnormal   Collection Time: 09/14/17  8:37 AM  Result Value Ref Range   Glucose-Capillary 112 (H) 65 - 99 mg/dL   Comment 1 Notify RN    Comment 2 Document in Chart    Dg Chest Port 1 View  Result Date: 09/13/2017 CLINICAL DATA:  61 year old male status post fall from 10 feet with multiple rib fractures, pneumothorax. Failed extubation on 09/09/2017. EXAM: PORTABLE CHEST 1 VIEW COMPARISON:  09/11/2017 and earlier. FINDINGS: Portable AP semi upright view at 0553 hours. Endotracheal tube tip is just below the clavicles. Enteric tube courses to the left upper quadrant, tip not included. Stable right subclavian central line. Trace if any residual right apical pneumothorax today. Possible small right pleural effusion. Coarse bilateral interstitial pulmonary opacity with basilar predominance persists and appears chronic to a degree. Mildly increased retrocardiac opacity on the left since yesterday. No other areas of worsening ventilation. Stable cardiac size and mediastinal contours. Lateral right mid to lower rib fracture again noted. IMPRESSION: 1.  Stable lines and tubes. 2. Regressed small right apical pneumothorax since yesterday. Trace if any residual. 3. Increased opacity at the left lung base, favor  atelectasis. Possible small right pleural effusion. Suspected underlying chronic interstitial lung disease. Electronically Signed   By: Genevie Ann M.D.   On: 09/13/2017 08:46    Assessment/Plan: Diagnosis: Polytrauma Labs and images independently reviewed.  Records reviewed and summated above.  1. Does the need for close, 24 hr/day medical supervision in concert with the patient's rehab needs make it unreasonable for this patient to be served in a less intensive setting? Yes  2. Co-Morbidities requiring supervision/potential complications: COPD (monitor RR and O2 sats with increased mobility), tobacco abuse (counsel), CAD (cont meds), pain management (Biofeedback training with therapies to help reduce reliance on opiate pain medications, particularly IV fentanyl, monitor pain control during therapies, and sedation at rest and titrate to maximum efficacy to ensure participation and gains in therapies), acute blood loss anemia (transfuse if necessary to ensure appropriate perfusion for increased activity tolerance), tachypnea (monitor RR and O2 Sats with increased physical exertion), urinary retention (cont meds), CAP (cont meds) 3. Due to safety, skin/wound care, disease management, pain management and patient education, does the patient require 24 hr/day rehab  nursing? Yes 4. Does the patient require coordinated care of a physician, rehab nurse, PT (1-2 hrs/day, 5 days/week) and OT (1-2 hrs/day, 5 days/week) to address physical and functional deficits in the context of the above medical diagnosis(es)? Yes Addressing deficits in the following areas: balance, endurance, locomotion, strength, transferring, bathing, dressing, toileting, speech and psychosocial support 5. Can the patient actively participate in an intensive therapy program of at least 3 hrs of therapy per day at least 5 days per week? Yes 6. The potential for patient to make measurable gains while on inpatient rehab is excellent 7. Anticipated  functional outcomes upon discharge from inpatient rehab are modified independent and supervision  with PT, modified independent and supervision with OT, n/a with SLP. 8. Estimated rehab length of stay to reach the above functional goals is: 14-18 days. 9. Anticipated D/C setting: Home 10. Anticipated post D/C treatments: HH therapy and Home excercise program 11. Overall Rehab/Functional Prognosis: excellent  RECOMMENDATIONS: This patient's condition is appropriate for continued rehabilitative care in the following setting: CIR Patient has agreed to participate in recommended program. Yes Note that insurance prior authorization may be required for reimbursement for recommended care.  Comment: Rehab Admissions Coordinator to follow up.  Delice Lesch, MD, ABPMR [name] Angiulli, PA-C 09/14/2017

## 2017-09-15 ENCOUNTER — Inpatient Hospital Stay (HOSPITAL_COMMUNITY): Payer: 59

## 2017-09-15 ENCOUNTER — Inpatient Hospital Stay (HOSPITAL_COMMUNITY): Admission: RE | Admit: 2017-09-15 | Payer: 59 | Source: Intra-hospital | Admitting: Physical Medicine & Rehabilitation

## 2017-09-15 LAB — GLUCOSE, CAPILLARY
GLUCOSE-CAPILLARY: 94 mg/dL (ref 65–99)
GLUCOSE-CAPILLARY: 100 mg/dL — AB (ref 65–99)
Glucose-Capillary: 95 mg/dL (ref 65–99)
Glucose-Capillary: 100 mg/dL — ABNORMAL HIGH (ref 65–99)
Glucose-Capillary: 161 mg/dL — ABNORMAL HIGH (ref 65–99)

## 2017-09-15 MED ORDER — FENTANYL CITRATE (PF) 100 MCG/2ML IJ SOLN
50.0000 ug | Freq: Once | INTRAMUSCULAR | Status: AC
  Administered 2017-09-15: 50 ug via INTRAVENOUS
  Filled 2017-09-15: qty 2

## 2017-09-15 MED ORDER — HYDROMORPHONE HCL 1 MG/ML IJ SOLN
1.0000 mg | Freq: Once | INTRAMUSCULAR | Status: AC
  Administered 2017-09-15: 1 mg via INTRAVENOUS
  Filled 2017-09-15: qty 1

## 2017-09-15 MED ORDER — CHLORHEXIDINE GLUCONATE 0.12 % MT SOLN
OROMUCOSAL | Status: AC
  Administered 2017-09-15: 15 mL via OROMUCOSAL
  Filled 2017-09-15: qty 15

## 2017-09-15 MED ORDER — DEXTROSE 5 % IV SOLN
1000.0000 mg | Freq: Once | INTRAVENOUS | Status: DC
  Filled 2017-09-15: qty 10

## 2017-09-15 NOTE — Progress Notes (Signed)
OT Cancellation Note  Patient Details Name: Derrick Sheppard MRN: [id number] DOB: [birth year]   Cancelled Treatment:    Reason Eval/Treat Not Completed: Pain limiting ability to participate(headache). Pt resting. Will attempt tomorrow.   [place], OT/L  [phone] 09/15/2017 09/15/2017, 4:41 PM

## 2017-09-15 NOTE — Progress Notes (Signed)
I met with pt at bedside with his wife. Insurance decision pending for a possible inpt rehab admit. Wife and pt state their concern now is a severe headache that he has never had one like this before. They state a nurse came in to check on him and provided med with no relief. I contacted pt's nurse, Jonelle Sidle, to make her aware of pt and wife complaint of severe headache and request to assess pt at this time.Pt up in chair with blinds closed, lights off and wife at bedside. Alert and oriented with c/o of headache. [phone]

## 2017-09-15 NOTE — Progress Notes (Signed)
[facility] Progress Note     Subjective: CC: wants to get home Patient states pain is well controlled. Denies SOB but states he feels like his breathing is just a little weak. Using IS sometimes.   Objective: Vital signs in last 24 hours: Temp:  [97.2 F (36.2 C)-98.2 F (36.8 C)] 98.2 F (36.8 C) (12/06 0240) Pulse Rate:  [63-81] 64 (12/06 0240) Resp:  [16-24] 18 (12/06 0240) BP: (109-163)/(44-85) 151/85 (12/06 0240) SpO2:  [89 %-98 %] 94 % (12/06 0240) Weight:  [74 kg (163 lb 2.3 oz)] 74 kg (163 lb 2.3 oz) (12/06 0529) Last BM Date: 09/13/17  Intake/Output from previous day: 12/05 0701 - 12/06 0700 In: 640 [P.O.:480; I.V.:160] Out: 550 [Urine:550] Intake/Output this shift: No intake/output data recorded.  PE: Gen:  Alert, NAD, pleasant Card:  Regular rate and rhythm, pedal pulses 2+ BL Pulm:  Normal effort, clear to auscultation bilaterally, pulling 1250 on IS Abd: Soft, non-tender, non-distended, bowel sounds present in all 4 quadrants Skin: warm and dry, no rashes; central line removal site c/d/i  Psych: A&Ox3   Lab Results:  Recent Labs    09/13/17 0515  WBC 10.7*  HGB 9.1*  HCT 28.7*  PLT 514*   BMET Recent Labs    09/13/17 0515  NA 140  K 4.0  CL 105  CO2 26  GLUCOSE 127*  BUN 32*  CREATININE 0.70  CALCIUM 8.3*   PT/INR No results for input(s): LABPROT, INR in the last 72 hours. CMP     Component Value Date/Time   NA 140 09/13/2017 0515   NA 137 12/02/2013 0405   K 4.0 09/13/2017 0515   K 3.8 12/02/2013 0405   CL 105 09/13/2017 0515   CL 106 12/02/2013 0405   CO2 26 09/13/2017 0515   CO2 26 12/02/2013 0405   GLUCOSE 127 (H) 09/13/2017 0515   GLUCOSE 88 12/02/2013 0405   BUN 32 (H) 09/13/2017 0515   BUN 12 12/02/2013 0405   CREATININE 0.70 09/13/2017 0515   CREATININE 0.94 12/02/2013 0405   CALCIUM 8.3 (L) 09/13/2017 0515   CALCIUM 8.3 (L) 12/02/2013 0405   PROT 5.2 (L) 09/04/2017 0344   ALBUMIN 2.0 (L) 09/04/2017  0344   AST 41 09/04/2017 0344   ALT 20 09/04/2017 0344   ALKPHOS 189 (H) 09/04/2017 0344   BILITOT 1.2 09/04/2017 0344   GFRNONAA >60 09/13/2017 0515   GFRNONAA >60 12/02/2013 0405   GFRAA >60 09/13/2017 0515   GFRAA >60 12/02/2013 0405   Lipase     Component Value Date/Time   LIPASE 32 08/30/2017 1835       Studies/Results: No results found.  Anti-infectives: Anti-infectives (From admission, onward)   Start     Dose/Rate Route Frequency Ordered Stop   09/04/17 1400  cefTRIAXone (ROCEPHIN) 2 g in dextrose 5 % 50 mL IVPB     2 g 100 mL/hr over 30 Minutes Intravenous Every 24 hours 09/04/17 0857 09/13/17 1821   09/04/17 0900  cefTRIAXone (ROCEPHIN) 1 g in dextrose 5 % 50 mL IVPB  Status:  Discontinued     1 g 100 mL/hr over 30 Minutes Intravenous Every 24 hours 09/04/17 0851 09/04/17 0857   09/03/17 1400  cefTAZidime (FORTAZ) 2 g in dextrose 5 % 50 mL IVPB  Status:  Discontinued     2 g 100 mL/hr over 30 Minutes Intravenous Every 8 hours 09/03/17 1309 09/04/17 0851   09/02/17 1400  cefTAZidime (FORTAZ) 2 g in dextrose 5 %  50 mL IVPB  Status:  Discontinued     2 g 100 mL/hr over 30 Minutes Intravenous Every 12 hours 09/02/17 1122 09/03/17 1309   09/02/17 0430  vancomycin (VANCOCIN) IVPB 1000 mg/200 mL premix  Status:  Discontinued     1,000 mg 200 mL/hr over 60 Minutes Intravenous Every 12 hours 09/02/17 0418 09/04/17 0851   09/02/17 0430  ceFEPIme (MAXIPIME) 1 g in dextrose 5 % 50 mL IVPB  Status:  Discontinued     1 g 100 mL/hr over 30 Minutes Intravenous Every 8 hours 09/02/17 0418 09/02/17 1111       Assessment/Plan Fall from ladder 10 feet Multiple right rib fractures.-multimodal pain control, epidural out Right pneumothorax-pigtail removed 11/27 Acute hypoxic resp failure -doing well since extubation COPD and tobacco abuse - Continue Spiriva and Dulera; Duonebs. Guaifenesin prn for thick secretions  FEN- diet VTE- lovenox ID - cefepime 11/23; vanc  11/23>11/25; Tressie Ellis 11/23>11/25; Rocephin 11/25>12/4  Dispo: CIR approval in process. Continue therapies     LOS: 15 days    Brigid Re , [facility] 09/15/2017, 8:44 AM Pager: [phone] Trauma Pager: [phone] Mon-Fri 7:00 am-4:30 pm Sat-Sun 7:00 am-11:30 am

## 2017-09-15 NOTE — Progress Notes (Signed)
Pt transferred to 4N progressive room 13 with all belongings that were in the room.  Wife called and updated about transfer and new room number.

## 2017-09-15 NOTE — Care Management Note (Signed)
Case Management Note  Patient Details  Name: Derrick Sheppard MRN: [id number] Date of Birth: Apr 24, 1956  Subjective/Objective:  Pt admitted on 08/30/17 s/p fall approximately 10 feet from a ladder.  Pt sustained multiple RT rib fx and trace PTX.   Pt with worsening PTX on 09/02/17 with CT placement and intubation.  PTA, pt independent, lives at home with spouse.                Action/Plan: Pt remains intubated with epidural for pain control.  Will continue to follow for discharge planning as pt progresses.    Pt successfully extubated on 09/13/17.  Expected Discharge Date:                 Expected Discharge Plan:  IP Rehab Facility  In-House Referral:  Clinical Social Work  Discharge planning Services  CM Consult  Post Acute Care Choice:    Choice offered to:     DME Arranged:    DME Agency:     HH Arranged:    [place] Agency:     Status of Service:  In process, will continue to follow  If discussed at Long Length of Stay Meetings, dates discussed:    Additional Comments:  09/15/17 J. Tomorrow Dehaas, RN, [employer] authorization pending for admission to [facility].  Will follow.    Reinaldo Raddle, RN, BSN  Trauma/Neuro ICU Case Manager [phone]

## 2017-09-15 NOTE — Discharge Summary (Addendum)
Physician Discharge Summary  Patient ID: Derrick Sheppard MRN: [id number] DOB/AGE: May 30, 1956 61 y.o.  Admit date: 08/30/2017 Discharge date: 09/17/2017  Discharge Diagnoses Fall from ladder Multiple right rib fractures Right pneumothorax Acute hypoxic respiratory failure - resolved COPD and tobacco abuse  Consultants CCM Anesthesia PT OT  Procedures 1. Right chest pigtail catheter placement - 09/02/17 Dr. Kae Heller 2. Epidural  - 09/05/17 Dr. Glennon Mac  HPI: Patient is a 61 year-old male who fell off ladder 10 ft onto his right side.  Worked up in ED showed multiple right rib fractures and trace PTX. Complained of right sided chest pain with deep inspiration. Denied back, neck, abdominal or extremity pain. Patient admitted to the trauma service for monitoring of pulmonary status and pain control.   Hospital Course: Patient noted to be hypoxic with increased work of breathing and severe pain 11/21, was moved to SDU and put on BiPAP. Follow up CXR 11/22 showed no pneumothorax. Patient started on antibiotics for suspected pneumonia 11/23. CT chest 11/23 showed increase in right pneumothorax and chest tube was placed. Patient was transferred to ICU and CCM consulted for assistance in pulmonary management. Patient intubated 11/23. Patient started on tube feeds. Anesthesia consulted for epidural catheter placement to aid in pain control, placed 11/26. Right chest tube removed 11/27 and repeat CXR showed no pneumothorax. Foley placed 11/29 for urinary retention. CXR 11/30 showed small recurrent right pneumothorax. Patient extubated 11/30 and re-intubated due to stridor, started on IV steroids. CXR 12/1 showed small decrease in right pneumothorax. Foley removed 12/3 for voiding trial. Patient completed antibiotics for HCAP 12/4. Extubated 12/4 and tolerated well, epidural catheter removed 12/4. Central line removed 12/5 and patient transferred out of ICU. Inpatient rehab consulted based on therapy  recommendations. He progressed with therapies and qualified for home health services.   On day of discharge, he was doing well. Pain was controlled. Vitals stable. No n/v/sob. sats stable with oxy while ambulating. Able to pull 1250 on is  I discussed dc instructions with thim. Case management arranged home health oxygen, pt/ot; DME walker and BSC  BP 109/65 (BP Location: Right Arm)   Pulse 77   Temp 97.6 F (36.4 C) (Oral)   Resp 16   Ht 5\' 7"  (1.702 m)   Wt 73.1 kg (161 lb 2.5 oz)   SpO2 96%   BMI 25.24 kg/m   Gen: alert, NAD, non-toxic appearing Pupils: equal, no scleral icterus Pulm: Lungs clear to auscultation, symmetric chest rise; expected chest wall tenderness CV: regular rate and rhythm Abd: soft, nontender, nondistended.  Ext: no edema, no calf tenderness Skin: no rash, no jaundice   Allergies as of 09/17/2017      Reactions   Amlodipine Swelling   Makes his ankles and legs swell.   Codeine Other (See Comments)   Gives him a Headache.      Medication List    TAKE these medications   albuterol 108 (90 Base) MCG/ACT inhaler Commonly known as:  PROVENTIL HFA;VENTOLIN HFA Inhale 2 puffs into the lungs every 6 (six) hours as needed for wheezing or shortness of breath.   aspirin EC 81 MG tablet Take 81 mg by mouth daily.   B-complex with vitamin C tablet Take 1 tablet by mouth daily.   Cholecalciferol 1000 units Tbdp Take 1,000 Units/day by mouth daily.   esomeprazole 40 MG capsule Commonly known as:  NEXIUM Take 40 mg by mouth daily at 12 noon.   Fluticasone-Salmeterol 250-50 MCG/DOSE Aepb Commonly known as:  ADVAIR Inhale 1 puff into the lungs 2 (two) times daily.   ibuprofen 200 MG tablet Commonly known as:  ADVIL,MOTRIN Take 600 mg by mouth every 4 (four) hours as needed.   losartan 50 MG tablet Commonly known as:  COZAAR Take 50 mg by mouth daily.   oxyCODONE 5 MG immediate release tablet Commonly known as:  Oxy IR/ROXICODONE Take 1 tablet  (5 mg total) by mouth every 4 (four) hours as needed (5mg  for moderate pain, 10mg  for severe pain).   polyethylene glycol packet Commonly known as:  MIRALAX / GLYCOLAX Take 17 g by mouth daily. Start taking on:  09/18/2017   tiotropium 18 MCG inhalation capsule Commonly known as:  SPIRIVA Place 18 mcg into inhaler and inhale daily.   traMADol 50 MG tablet Commonly known as:  ULTRAM Take 1 tablet (50 mg total) by mouth every 6 (six) hours.            Durable Medical Equipment  (From admission, onward)        Start     Ordered   09/17/17 0000  DME Bedside commode    Question:  Patient needs a bedside commode to treat with the following condition  Answer:  Immobility   09/17/17 1010   09/17/17 0000  For home use only DME 4 wheeled rolling walker with seat    Question:  Patient needs a walker to treat with the following condition  Answer:  Rib fractures   09/17/17 1010   09/16/17 1650  For home use only DME oxygen  Once    Question Answer Comment  Mode or (Route) Nasal cannula   Liters per Minute 4   Frequency Continuous (stationary and portable oxygen unit needed)   Oxygen conserving device Yes   Oxygen delivery system Gas      09/16/17 1650       Follow-up Information    [facility]. Go on 10/07/2017.   Why:  Your appointment is at 9:00 AM. Please arrive 30 min prior to appointment time for check in. Bring photo ID and insurance information.  Contact information: [place] [phone]       Adin Hector, MD Follow up.   Specialty:  Internal Medicine Why:  Follow up with your PCP in 1-2 weeks for monitoring of pulmonary status  Contact information: [address] [facility] [place] [address] [phone]          Total time coordinating discharge - 30 min  Signed: Leighton Ruff. Redmond Pulling, MD, FACS General, Bariatric, & Minimally Invasive Surgery [facility]  Surgery, [place]

## 2017-09-15 NOTE — Progress Notes (Signed)
Pt states that he was laying in the bed and had an onset of a headache. He describes it as a non stop dull ache.  Patient states that a Nurse Vickki Muff) came in while I was on lunch and gave him oxy 10 mg. States that the pain is now a 7 or 8/10 but was a strong 10 before the medicine. Pain travels to the right eye but not below and not across the midline to the left side. Most current vitals aRE PULSE OF 81, SP02 IS 93% ON 3 LITERS RR OF 15 AND B/P OF 105/58 (87). Verbal order given by Dr. Grandville Silos for a 1 time dose of morphine 4 mg.

## 2017-09-16 ENCOUNTER — Inpatient Hospital Stay (HOSPITAL_COMMUNITY): Payer: 59

## 2017-09-16 LAB — BLOOD GAS, ARTERIAL
Acid-base deficit: 1.2 mmol/L (ref 0.0–2.0)
Bicarbonate: 22.3 mmol/L (ref 20.0–28.0)
Drawn by: 313941
O2 CONTENT: 4 L/min
O2 SAT: 92.4 %
PCO2 ART: 32.2 mmHg (ref 32.0–48.0)
Patient temperature: 98.1
pH, Arterial: 7.453 — ABNORMAL HIGH (ref 7.350–7.450)
pO2, Arterial: 63.7 mmHg — ABNORMAL LOW (ref 83.0–108.0)

## 2017-09-16 LAB — CBC
HCT: 35.5 % — ABNORMAL LOW (ref 39.0–52.0)
HEMOGLOBIN: 12.2 g/dL — AB (ref 13.0–17.0)
MCH: 29.7 pg (ref 26.0–34.0)
MCHC: 34.4 g/dL (ref 30.0–36.0)
MCV: 86.4 fL (ref 78.0–100.0)
Platelets: 628 10*3/uL — ABNORMAL HIGH (ref 150–400)
RBC: 4.11 MIL/uL — AB (ref 4.22–5.81)
RDW: 13.8 % (ref 11.5–15.5)
WBC: 12.4 10*3/uL — ABNORMAL HIGH (ref 4.0–10.5)

## 2017-09-16 LAB — GLUCOSE, CAPILLARY
GLUCOSE-CAPILLARY: 112 mg/dL — AB (ref 65–99)
GLUCOSE-CAPILLARY: 106 mg/dL — AB (ref 65–99)
Glucose-Capillary: 114 mg/dL — ABNORMAL HIGH (ref 65–99)
Glucose-Capillary: 114 mg/dL — ABNORMAL HIGH (ref 65–99)
Glucose-Capillary: 95 mg/dL (ref 65–99)
Glucose-Capillary: 99 mg/dL (ref 65–99)

## 2017-09-16 MED ORDER — IPRATROPIUM-ALBUTEROL 0.5-2.5 (3) MG/3ML IN SOLN
3.0000 mL | Freq: Three times a day (TID) | RESPIRATORY_TRACT | Status: DC
  Administered 2017-09-16 – 2017-09-17 (×4): 3 mL via RESPIRATORY_TRACT
  Filled 2017-09-16 (×4): qty 3

## 2017-09-16 NOTE — Progress Notes (Signed)
Physical Therapy Treatment Patient Details Name: Derrick Sheppard MRN: [id number] DOB: [birth year] Today's Date: 09/16/2017    History of Present Illness 61 yo admitted after fall from ladder with right rib fx, PTX, intubated 11/23 and chest tube placed, epidural cath placed 11/27. Extubated and epidural catheter removed 09/13/17. PMHx: COPD, CAD, GERD    PT Comments    Patient seen for mobility progression. Improvements noted in physical function and overall balance with mobility. Patient is demonstrating increased pain but reports pain on LEFT flank today. Additionally, patient mobilized with this therapist on Wednesday on ROOM AIR with saturations stable >90%, however, patient received on 5 liters today. When attempting to sit EOB on room air, saturations immediately dropped to 86% without activity and patient grimacing/guarding LEFT flank. Replaced patient on 4 liters [place] during mobility with saturations 89-91%. Patient returned to room post ambulation and encouraged to perform IS with draw of 800x2 and 1000. Notified nursing and trauma PA.   OF NOTE: discharge recommendations updated as patient demonstrates continued improvements in overall functional mobility and anticipate that patient will be more receptive to d/c home with HHPT and family at this time.   Follow Up Recommendations  Home health PT;Supervision/Assistance - 24 hour     Equipment Recommendations  Other (comment)(TBD with progression)    Recommendations for Other Services OT consult     Precautions / Restrictions Precautions Precautions: Fall Restrictions Weight Bearing Restrictions: No    Mobility  Bed Mobility Overal bed mobility: Needs Assistance Bed Mobility: Rolling;Supine to Sit Rolling: Supervision   Supine to sit: Supervision     General bed mobility comments: supervision for safety, no physical assist required, increased time to EOB. Patient reporting increased pain with guarding left flank. nsg  aware  Transfers Overall transfer level: Needs assistance Equipment used: Rolling walker (2 wheeled) Transfers: Sit to/from Stand Sit to Stand: Supervision         General transfer comment: No physical assist required  Ambulation/Gait Ambulation/Gait assistance: Supervision   Assistive device: Rolling walker (2 wheeled) Gait Pattern/deviations: Narrow base of support;Step-through pattern Gait velocity: decreased Gait velocity interpretation: Below normal speed for age/gender General Gait Details: patient cued for increased cadence and at times required cues for positioning with RW. patient appears with significant DOE as ambulation progressed. Saturations stable on 4 liters >91%.    Stairs            Wheelchair Mobility    Modified Rankin (Stroke Patients Only)       Balance Overall balance assessment: Needs assistance Sitting-balance support: Bilateral upper extremity supported;Feet supported Sitting balance-Leahy Scale: Fair Sitting balance - Comments: able to sit EOB without external physical assist   Standing balance support: Bilateral upper extremity supported Standing balance-Leahy Scale: Fair Standing balance comment: able to stand without upright support however, upright support more comfortable given pain in flank                            Cognition Arousal/Alertness: Awake/alert Behavior During Therapy: Flat affect Overall Cognitive Status: Within Functional Limits for tasks assessed                                 General Comments: patient very flat affect but otherwise apporpriate with responses throughout session today      Exercises      General Comments  Pertinent Vitals/Pain Pain Assessment: Faces Faces Pain Scale: Hurts even more Pain Location: patient reporting pain on left side Pain Descriptors / Indicators: Aching;Discomfort;Grimacing;Guarding Pain Intervention(s): Monitored during session     Home Living                      Prior Function            PT Goals (current goals can now be found in the care plan section) Acute Rehab PT Goals Patient Stated Goal: to go home PT Goal Formulation: With patient Time For Goal Achievement: 09/22/17 Potential to Achieve Goals: Good Progress towards PT goals: Progressing toward goals    Frequency    Min 3X/week      PT Plan Discharge plan needs to be updated    Co-evaluation              AM-PAC PT "6 Clicks" Daily Activity  Outcome Measure  Difficulty turning over in bed (including adjusting bedclothes, sheets and blankets)?: None Difficulty moving from lying on back to sitting on the side of the bed? : A Little Difficulty sitting down on and standing up from a chair with arms (e.g., wheelchair, bedside commode, etc,.)?: A Little Help needed moving to and from a bed to chair (including a wheelchair)?: A Little Help needed walking in hospital room?: A Little Help needed climbing 3-5 steps with a railing? : A Lot 6 Click Score: 18    End of Session Equipment Utilized During Treatment: Gait belt Activity Tolerance: Patient tolerated treatment well Patient left: in chair;with call bell/phone within reach;with chair alarm set;with nursing/sitter in room Nurse Communication: Mobility status;Precautions(Concerns re: resp status and pain compared to prior session) PT Visit Diagnosis: Other abnormalities of gait and mobility (R26.89);Difficulty in walking, not elsewhere classified (R26.2);Muscle weakness (generalized) (M62.81)     Time: 8416-6063 PT Time Calculation (min) (ACUTE ONLY): 25 min  Charges:  $Gait Training: 8-22 mins $Therapeutic Activity: 8-22 mins                    G Codes:       Alben Deeds, PT DPT  Board Certified Neurologic Specialist [place] 09/16/2017, 9:27 AM

## 2017-09-16 NOTE — Progress Notes (Signed)
OT Treatment Note  Pt making good progress. Able to complete simple ADL task and ambulate short loop around unit with VSS. Pt ambulated on 3L with O2 from 89-92. HR 89; BP 112/69. O2  On 2L end of session 94 seated. Recommend DC home when medically stable with HHOT. Will continue to follow.   09/16/17 1300  OT Visit Information  Last OT Received On 09/16/17  Assistance Needed +1  History of Present Illness 61 yo admitted after fall from ladder with right rib fx, PTX, intubated 11/23 and chest tube placed, epidural cath placed 11/27. Extubated and epidural catheter removed 09/13/17. PMHx: COPD, CAD, GERD  Precautions  Precautions Fall  Pain Assessment  Pain Assessment 0-10  Pain Score 5  Pain Location chest  Pain Descriptors / Indicators Aching;Discomfort;Grimacing;Guarding  Pain Intervention(s) Limited activity within patient's tolerance  Cognition  Arousal/Alertness Awake/alert  Behavior During Therapy Flat affect  Overall Cognitive Status Within Functional Limits for tasks assessed  Orientation Level (thinks it is 2019 and is at [facility])  Safety/Judgement Decreased awareness of safety  Awareness Emergent  General Comments Improving cognition; slow problem solving; unsafe at times - lettin ggo of RW when he was @ 5 steps from chair  ADL  Overall ADL's  Needs assistance/impaired  Grooming Sitting;Set up  Upper Body Bathing Sitting;Set up  Lower Body Bathing Moderate assistance;Sit to/from stand  Upper Body Dressing  Moderate assistance;Sitting  Lower Body Dressing Moderate assistance;Sit to/from Retail buyer Minimal assistance;Ambulation  Toileting- Clothing Manipulation and Hygiene Sit to/from stand;Minimal assistance  Functional mobility during ADLs Minimal assistance;Rolling walker;Cueing for safety (for stand pivot. Will need +2 for ambulation )  General ADL Comments Began education on compensatory technqiues for ADL to reduce pain and improve ability to complete tasks  independently  Bed Mobility  General bed mobility comments OOB in chair  Balance  Overall balance assessment Needs assistance  Sitting balance-Leahy Scale Good  Standing balance support Bilateral upper extremity supported  Standing balance-Leahy Scale Fair  Standing balance comment able to stand without upright support however, upright support more comfortable given pain in flank  Restrictions  Weight Bearing Restrictions No  Transfers  Overall transfer level Needs assistance  Equipment used Rolling walker (2 wheeled)  Transfers Sit to/from Stand  Sit to Stand Supervision  Exercises  Exercises Other exercises  Other Exercises  Other Exercises encouraged use of IS  OT - End of Session  Equipment Utilized During Treatment Gait belt;Oxygen (3L)  Activity Tolerance Patient tolerated treatment well  Patient left in chair;with call bell/phone within reach;with chair alarm set;with family/visitor present  Nurse Communication Mobility status;Other (comment) (O2)  OT Assessment/Plan  OT Plan Discharge plan needs to be updated  OT Visit Diagnosis Unsteadiness on feet (R26.81);Muscle weakness (generalized) (M62.81);Other symptoms and signs involving cognitive function;Pain  Pain - part of body (ribs)  OT Frequency (ACUTE ONLY) Min 2X/week  Follow Up Recommendations Home health OT;Supervision/Assistance - 24 hour (initially)  OT Equipment 3 in 1 bedside commode;Other (comment) (RW)  AM-PAC OT "6 Clicks" Daily Activity Outcome Measure  Help from another person eating meals? 4  Help from another person taking care of personal grooming? 3  Help from another person toileting, which includes using toliet, bedpan, or urinal? 3  Help from another person bathing (including washing, rinsing, drying)? 3  Help from another person to put on and taking off regular upper body clothing? 3  Help from another person to put on and taking off regular lower body clothing?  3  6 Click Score 19  ADL G Code  Conversion CK  OT Goal Progression  Progress towards OT goals Progressing toward goals  Acute Rehab OT Goals  Patient Stated Goal to go home  OT Goal Formulation With patient  Time For Goal Achievement 09/27/17  Potential to Achieve Goals Good  ADL Goals  Pt Will Perform Toileting - Clothing Manipulation and hygiene with supervision  Pt Will Perform Upper Body Bathing with modified independence  Pt Will Perform Lower Body Dressing with supervision;with set-up;sit to/from stand  Pt Will Transfer to Toilet with supervision;bedside commode;ambulating  Pt Will Perform Lower Body Bathing with set-up;with supervision;sit to/from stand  Additional ADL Goal #1 Pt will demonstrate anticipatory awareness during ADL tasks in minimally distracting environment  OT Time Calculation  OT Start Time (ACUTE ONLY) 1249  OT Stop Time (ACUTE ONLY) 1314  OT Time Calculation (min) 25 min  OT General Charges  $OT Visit 1 Visit  OT Treatments  $Self Care/Home Management  23-37 mins  [facility], OT/L  [phone] 09/16/2017

## 2017-09-16 NOTE — Progress Notes (Signed)
[facility] Progress Note     Subjective: CC: hypoxia Patient dropped sats while getting up with PT into the 80s. Now on 4 L O2. Does not wear O2 at home.  Objective: Vital signs in last 24 hours: Temp:  [97.2 F (36.2 C)-99.6 F (37.6 C)] 98.1 F (36.7 C) (12/07 0843) Pulse Rate:  [69-97] 69 (12/07 0600) Resp:  [17-32] 18 (12/07 0400) BP: (105-121)/(58-94) 109/76 (12/07 0315) SpO2:  [92 %-98 %] 95 % (12/07 0600) Weight:  [73 kg (160 lb 15 oz)] 73 kg (160 lb 15 oz) (12/07 0500) Last BM Date: 09/14/17  Intake/Output from previous day: 12/06 0701 - 12/07 0700 In: -  Out: 950 [Urine:950] Intake/Output this shift: No intake/output data recorded.  PE: Gen:  Alert, NAD, pleasant Card:  Regular rate and rhythm, pedal pulses 2+ BL Pulm:  Normal effort, diminished bilaterally, pulling 750 on IS, sats in low 90s on 4L via [place] Abd: Soft, non-tender, non-distended, bowel sounds present in all 4 quadrants Skin: warm and dry, no rashes; central line removal site c/d/i  Psych: A&Ox3   Lab Results:  No results for input(s): WBC, HGB, HCT, PLT in the last 72 hours. BMET No results for input(s): NA, K, CL, CO2, GLUCOSE, BUN, CREATININE, CALCIUM in the last 72 hours. PT/INR No results for input(s): LABPROT, INR in the last 72 hours. CMP     Component Value Date/Time   NA 140 09/13/2017 0515   NA 137 12/02/2013 0405   K 4.0 09/13/2017 0515   K 3.8 12/02/2013 0405   CL 105 09/13/2017 0515   CL 106 12/02/2013 0405   CO2 26 09/13/2017 0515   CO2 26 12/02/2013 0405   GLUCOSE 127 (H) 09/13/2017 0515   GLUCOSE 88 12/02/2013 0405   BUN 32 (H) 09/13/2017 0515   BUN 12 12/02/2013 0405   CREATININE 0.70 09/13/2017 0515   CREATININE 0.94 12/02/2013 0405   CALCIUM 8.3 (L) 09/13/2017 0515   CALCIUM 8.3 (L) 12/02/2013 0405   PROT 5.2 (L) 09/04/2017 0344   ALBUMIN 2.0 (L) 09/04/2017 0344   AST 41 09/04/2017 0344   ALT 20 09/04/2017 0344   ALKPHOS 189 (H) 09/04/2017 0344   BILITOT 1.2 09/04/2017 0344   GFRNONAA >60 09/13/2017 0515   GFRNONAA >60 12/02/2013 0405   GFRAA >60 09/13/2017 0515   GFRAA >60 12/02/2013 0405   Lipase     Component Value Date/Time   LIPASE 32 08/30/2017 1835       Studies/Results: Ct Head Wo Contrast  Result Date: 09/15/2017 CLINICAL DATA:  61 y/o M; nonstop headache traveling to the right eye. EXAM: CT HEAD WITHOUT CONTRAST TECHNIQUE: Contiguous axial images were obtained from the base of the skull through the vertex without intravenous contrast. COMPARISON:  08/30/2017 CT head FINDINGS: Brain: No evidence of acute infarction, hemorrhage, hydrocephalus, extra-axial collection or mass lesion/mass effect. Stable chronic microvascular ischemic changes and parenchymal volume loss of the brain. Vascular: Calcific atherosclerosis of carotid siphons. Skull: Normal. Negative for fracture or focal lesion. Sinuses/Orbits: Bilateral mastoid effusions. Right sphenoid sinus mucous retention cyst. Orbits are unremarkable. Other: None. IMPRESSION: 1. No acute intracranial abnormality. 2. Stable chronic microvascular ischemic changes and parenchymal volume loss of the brain. 3. New bilateral mastoid effusions. No findings of coalescent mastoiditis. Electronically Signed   By: Kristine Garbe M.D.   On: 09/15/2017 23:47    Anti-infectives: Anti-infectives (From admission, onward)   Start     Dose/Rate Route Frequency Ordered Stop   09/04/17  1400  cefTRIAXone (ROCEPHIN) 2 g in dextrose 5 % 50 mL IVPB     2 g 100 mL/hr over 30 Minutes Intravenous Every 24 hours 09/04/17 0857 09/13/17 1821   09/04/17 0900  cefTRIAXone (ROCEPHIN) 1 g in dextrose 5 % 50 mL IVPB  Status:  Discontinued     1 g 100 mL/hr over 30 Minutes Intravenous Every 24 hours 09/04/17 0851 09/04/17 0857   09/03/17 1400  cefTAZidime (FORTAZ) 2 g in dextrose 5 % 50 mL IVPB  Status:  Discontinued     2 g 100 mL/hr over 30 Minutes Intravenous Every 8 hours 09/03/17 1309  09/04/17 0851   09/02/17 1400  cefTAZidime (FORTAZ) 2 g in dextrose 5 % 50 mL IVPB  Status:  Discontinued     2 g 100 mL/hr over 30 Minutes Intravenous Every 12 hours 09/02/17 1122 09/03/17 1309   09/02/17 0430  vancomycin (VANCOCIN) IVPB 1000 mg/200 mL premix  Status:  Discontinued     1,000 mg 200 mL/hr over 60 Minutes Intravenous Every 12 hours 09/02/17 0418 09/04/17 0851   09/02/17 0430  ceFEPIme (MAXIPIME) 1 g in dextrose 5 % 50 mL IVPB  Status:  Discontinued     1 g 100 mL/hr over 30 Minutes Intravenous Every 8 hours 09/02/17 0418 09/02/17 1111       Assessment/Plan Fall from ladder 10 feet Multiple right rib fractures.-multimodal pain control, epiduralout Right pneumothorax-pigtail removed 11/27 Acute hypoxic resp failure-dropped sats this AM with therapies into the 80s, checking STAT CXR, may need CT to r/o PE COPD and tobacco abuse- Continue Spiriva and Dulera; Duonebs. Guaifenesin prn for thick secretions Headache - acute onset last night, CT head negative  FEN-diet VTE- lovenox ID - cefepime 11/23; vanc 11/23>11/25; Tressie Ellis 11/23>11/25; Rocephin 11/25>12/4  Dispo:PT now recommending home over CIR, will await OT recommendations as well. Stat CXR. Stat ABG   LOS: 16 days    Brigid Re , [facility] Surgery 09/16/2017, 9:13 AM Pager: [phone] Trauma Pager: [phone] Mon-Fri 7:00 am-4:30 pm Sat-Sun 7:00 am-11:30 am

## 2017-09-16 NOTE — Progress Notes (Signed)
SATURATION QUALIFICATIONS: (This note is used to comply with regulatory documentation for home oxygen)  Patient Saturations on Room Air at Rest = 86%  Please briefly explain why patient needs home oxygen: Patient drops 02 saturation below 88% on room air at rest.

## 2017-09-16 NOTE — Progress Notes (Signed)
1900: Handoff report received from RN. Pt resting comfortably in the chair. Family headed home for the evening, expecting pt to d/c home tomorrow. Much less anxious than previous shift.  2200: Pt sleeping in chair comfortably. Moved to bed after pm meds.  0000: Pt c/o heartburn. Dr. Hulen Skains (trauma) consulted, ordered Maalox. Pain well managed. Occasionally removes nasal cannula and sats drop to mid 80s. Easily reoriented and compliant with reapplication.  0400: Pt continues resting comfortably.  0700: Handoff report given to RN. No acute events overnight.

## 2017-09-16 NOTE — Progress Notes (Signed)
1900: Handoff report received from RN. Family anxious about uncontrolled pain from headache for most of the day. Looking forward to CT results. Pt restricted and not very interactive in assessment.  2100: Family left and pt seems to be able to get better rest without interruption.  0000: Pt resting comfortably. Much more interactive with assessments.   0400: Pt continues resting comfortably.  0700: Handoff report given to RN. No acute events overnight.

## 2017-09-16 NOTE — Progress Notes (Signed)
I met with pt and his wife at bedside. Patient has progressed with therapy to be able to d/c home with [place] when medically ready. We will sign off. RN CM and SW made aware. [phone]

## 2017-09-16 NOTE — Care Management Note (Signed)
Case Management Note  Patient Details  Name: Derrick Sheppard MRN: [id number] Date of Birth: [birth year]  Subjective/Objective:  Pt admitted on 08/30/17 s/p fall approximately 10 feet from a ladder.  Pt sustained multiple RT rib fx and trace PTX.   Pt with worsening PTX on 09/02/17 with CT placement and intubation.  PTA, pt independent, lives at home with spouse.                Action/Plan: Pt remains intubated with epidural for pain control.  Will continue to follow for discharge planning as pt progresses.    Pt successfully extubated on 09/13/17.  Expected Discharge Date:                 Expected Discharge Plan:  [place]  In-House Referral:  Clinical Social Work  Discharge planning Services  CM Consult  Post Acute Care Choice:  Durable Medical Equipment, Home Health Choice offered to:  Patient, Spouse  DME Arranged:  3-N-1, Oxygen, Walker rolling DME Agency:  [place]:  PT, OT [place] Agency:  [place]  Status of Service:  Completed, signed off  If discussed at [place] of Stay Meetings, dates discussed:    Additional Comments:  09/15/17 J. Faydra Korman, RN, [employer] authorization pending for admission to [facility].  Will follow.    09/16/17 J. Trayquan Kolakowski, RN, BSN Pt has progressed with therapies to home with [facility] services and wants to go home.  Met with pt and family members to discuss home arrangements.  Wife and family able to provide 24h care at dc.  Pt will need home oxygen at discharge, as well as 3 in 1 and RW.  Pt agreeable (reluctantly) to home therapies, and prefers [facility] for [facility] needs.  Start of care 24-48h post dc date.  Pt hopeful for dc home on 09/17/17.    Reinaldo Raddle, RN, BSN  Trauma/Neuro ICU Case Manager [phone]

## 2017-09-17 LAB — GLUCOSE, CAPILLARY
GLUCOSE-CAPILLARY: 100 mg/dL — AB (ref 65–99)
GLUCOSE-CAPILLARY: 93 mg/dL (ref 65–99)
GLUCOSE-CAPILLARY: 98 mg/dL (ref 65–99)

## 2017-09-17 LAB — BASIC METABOLIC PANEL
ANION GAP: 11 (ref 5–15)
BUN: 13 mg/dL (ref 6–20)
CALCIUM: 8.4 mg/dL — AB (ref 8.9–10.3)
CO2: 21 mmol/L — ABNORMAL LOW (ref 22–32)
Chloride: 99 mmol/L — ABNORMAL LOW (ref 101–111)
Creatinine, Ser: 0.73 mg/dL (ref 0.61–1.24)
Glucose, Bld: 98 mg/dL (ref 65–99)
POTASSIUM: 4 mmol/L (ref 3.5–5.1)
Sodium: 131 mmol/L — ABNORMAL LOW (ref 135–145)

## 2017-09-17 MED ORDER — OXYCODONE HCL 5 MG PO TABS: 5.0000 mg | ORAL_TABLET | ORAL | 0 refills | Status: DC | PRN

## 2017-09-17 MED ORDER — TRAMADOL HCL 50 MG PO TABS: 50.0000 mg | ORAL_TABLET | Freq: Four times a day (QID) | ORAL | 0 refills | Status: DC

## 2017-09-17 MED ORDER — ALUM & MAG HYDROXIDE-SIMETH 200-200-20 MG/5ML PO SUSP
30.0000 mL | Freq: Once | ORAL | Status: AC
  Administered 2017-09-17: 30 mL via ORAL
  Filled 2017-09-17: qty 30

## 2017-09-17 MED ORDER — CHLORHEXIDINE GLUCONATE 0.12 % MT SOLN
OROMUCOSAL | Status: AC
  Administered 2017-09-17: 15 mL via OROMUCOSAL
  Filled 2017-09-17: qty 15

## 2017-09-17 MED ORDER — POLYETHYLENE GLYCOL 3350 17 G PO PACK: 17.0000 g | PACK | Freq: Every day | ORAL | 0 refills | Status: DC

## 2017-09-17 NOTE — Progress Notes (Signed)
Physical Therapy Treatment Patient Details Name: Derrick Sheppard MRN: [id number] DOB: June 03, 1956 Today's Date: 09/17/2017    History of Present Illness 61 yo admitted after fall from ladder with right rib fx, PTX, intubated 11/23 and chest tube placed, epidural cath placed 11/27. Extubated and epidural catheter removed 09/13/17. PMHx: COPD, CAD, GERD    PT Comments    Plan is for pt to d/c home today. Pt to have home O2.    Follow Up Recommendations  Home health PT;Supervision/Assistance - 24 hour     Equipment Recommendations  Rolling walker with 5" wheels    Recommendations for Other Services       Precautions / Restrictions Precautions Precautions: Fall Restrictions Weight Bearing Restrictions: No    Mobility  Bed Mobility     Rolling: Modified independent (Device/Increase time)   Supine to sit: Supervision Sit to supine: Supervision   General bed mobility comments: supervision for safety  Transfers   Equipment used: Rolling walker (2 wheeled)   Sit to Stand: Supervision         General transfer comment: No physical assist required  Ambulation/Gait Ambulation/Gait assistance: Supervision Ambulation Distance (Feet): 150 Feet Assistive device: Rolling walker (2 wheeled) Gait Pattern/deviations: Step-through pattern Gait velocity: decreased Gait velocity interpretation: Below normal speed for age/gender General Gait Details: Pt on 1 L O2 in bed with SpO2 99%. During ambulation desat to 82% on 2 L. Upon return to EOB, immediate return to 100%. Unsure of accuracy of SpO2 reading during ambulation due to pt gripping RW with sensor on finger.  Pt assymptomatic. Only mild SOB upon return to room with no c/o.    Stairs            Wheelchair Mobility    Modified Rankin (Stroke Patients Only)       Balance   Sitting-balance support: No upper extremity supported;Feet supported Sitting balance-Leahy Scale: Good     Standing balance support:  Bilateral upper extremity supported;During functional activity Standing balance-Leahy Scale: Fair Standing balance comment: RW needed for ambulation                            Cognition Arousal/Alertness: Awake/alert Behavior During Therapy: Flat affect Overall Cognitive Status: Within Functional Limits for tasks assessed                                        Exercises      General Comments        Pertinent Vitals/Pain Pain Assessment: No/denies pain    Home Living                      Prior Function            PT Goals (current goals can now be found in the care plan section) Acute Rehab PT Goals Patient Stated Goal: to go home PT Goal Formulation: With patient Time For Goal Achievement: 09/22/17 Potential to Achieve Goals: Good Progress towards PT goals: Progressing toward goals    Frequency    Min 3X/week      PT Plan Current plan remains appropriate    Co-evaluation              AM-PAC PT "6 Clicks" Daily Activity  Outcome Measure  Difficulty turning over in bed (including adjusting bedclothes, sheets and blankets)?: None Difficulty moving  from lying on back to sitting on the side of the bed? : A Little Difficulty sitting down on and standing up from a chair with arms (e.g., wheelchair, bedside commode, etc,.)?: A Little Help needed moving to and from a bed to chair (including a wheelchair)?: None Help needed walking in hospital room?: A Little Help needed climbing 3-5 steps with a railing? : A Little 6 Click Score: 20    End of Session Equipment Utilized During Treatment: Gait belt Activity Tolerance: Patient tolerated treatment well Patient left: in bed;with call bell/phone within reach Nurse Communication: Mobility status PT Visit Diagnosis: Other abnormalities of gait and mobility (R26.89);Difficulty in walking, not elsewhere classified (R26.2);Muscle weakness (generalized) (M62.81)     Time:  6384-5364 PT Time Calculation (min) (ACUTE ONLY): 19 min  Charges:  $Gait Training: 8-22 mins                    G Codes:       [name], PT  Office # [phone] Pager [phone]    [name] 09/17/2017, 10:29 AM

## 2017-09-17 NOTE — Discharge Instructions (Signed)
Acute Respiratory Failure, Adult Acute respiratory failure occurs when there is not enough oxygen passing from your lungs to your body. When this happens, your lungs have trouble removing carbon dioxide from the blood. This causes your blood oxygen level to drop too low as carbon dioxide builds up. Acute respiratory failure is a medical emergency. It can develop quickly, but it is temporary if treated promptly. Your lung capacity, or how much air your lungs can hold, may improve with time, exercise, and treatment. What are the causes? There are many possible causes of acute respiratory failure, including:  Lung injury.  Chest injury or damage to the ribs or tissues near the lungs.  Lung conditions that affect the flow of air and blood into and out of the lungs, such as pneumonia, acute respiratory distress syndrome, and cystic fibrosis.  Medical conditions, such as strokes or spinal cord injuries, that affect the muscles and nerves that control breathing.  Blood infection (sepsis).  Inflammation of the pancreas (pancreatitis).  A blood clot in the lungs (pulmonary embolism).  A large-volume blood transfusion.  Burns.  Near-drowning.  Seizure.  Smoke inhalation.  Reaction to medicines.  Alcohol or drug overdose.  What increases the risk? This condition is more likely to develop in people who have:  A blocked airway.  Asthma.  A condition or disease that damages or weakens the muscles, nerves, bones, or tissues that are involved in breathing.  A serious infection.  A health problem that blocks the unconscious reflex that is involved in breathing, such as hypothyroidism or sleep apnea.  A lung injury or trauma.  What are the signs or symptoms? Trouble breathing is the main symptom of acute respiratory failure. Symptoms may also include:  Rapid breathing.  Restlessness or anxiety.  Skin, lips, or fingernails that appear blue (cyanosis).  Rapid heart  rate.  Abnormal heart rhythms (arrhythmias).  Confusion or changes in behavior.  Tiredness or loss of energy.  Feeling sleepy or having a loss of consciousness.  How is this diagnosed? Your health care provider can diagnose acute respiratory failure with a medical history and physical exam. During the exam, your health care provider will listen to your heart and check for crackling or wheezing sounds in your lungs. Your may also have tests to confirm the diagnosis and determine what is causing respiratory failure. These tests may include:  Measuring the amount of oxygen in your blood (pulse oximetry). The measurement comes from a small device that is placed on your finger, earlobe, or toe.  Other blood tests to measure blood gases and to look for signs of infection.  Sampling your cerebral spinal fluid or tracheal fluid to check for infections.  Chest X-ray to look for fluid in spaces that should be filled with air.  Electrocardiogram (ECG) to look at the heart's electrical activity.  How is this treated? Treatment for this condition usually takes places in a hospital intensive care unit (ICU). Treatment depends on what is causing the condition. It may include one or more treatments until your symptoms improve. Treatment may include:  Supplemental oxygen. Extra oxygen is given through a tube in the nose, a face mask, or a hood.  A device such as a continuous positive airway pressure (CPAP) or bi-level positive airway pressure (BiPAP or BPAP) machine. This treatment uses mild air pressure to keep the airways open. A mask or other device will be placed over your nose or mouth. A tube that is connected to a motor will  deliver oxygen through the mask.  Ventilator. This treatment helps move air into and out of the lungs. This may be done with a bag and mask or a machine. For this treatment, a tube is placed in your windpipe (trachea) so air and oxygen can flow to the lungs.  Extracorporeal  membrane oxygenation (ECMO). This treatment temporarily takes over the function of the heart and lungs, supplying oxygen and removing carbon dioxide. ECMO gives the lungs a chance to recover. It may be used if a ventilator is not effective.  Tracheostomy. This is a procedure that creates a hole in the neck to insert a breathing tube.  Receiving fluids and medicines.  Rocking the bed to help breathing.  Follow these instructions at home:  Take over-the-counter and prescription medicines only as told by your health care provider.  Return to normal activities as told by your health care provider. Ask your health care provider what activities are safe for you.  Keep all follow-up visits as told by your health care provider. This is important. How is this prevented? Treating infections and medical conditions that may lead to acute respiratory failure can help prevent the condition from developing. Contact a health care provider if:  You have a fever.  Your symptoms do not improve or they get worse. Get help right away if:  You are having trouble breathing.  You lose consciousness.  Your have cyanosis or turn blue.  You develop a rapid heart rate.  You are confused. These symptoms may represent a serious problem that is an emergency. Do not wait to see if the symptoms will go away. Get medical help right away. Call your local emergency services (911 in the [place] Do not drive yourself to the hospital. This information is not intended to replace advice given to you by your health care provider. Make sure you discuss any questions you have with your health care provider. Document Released: 10/02/2013 Document Revised: 04/24/2016 Document Reviewed: 04/14/2016 Elsevier Interactive Patient Education  2018 [employer]. Constipation    Acute Respiratory Failure, Adult Acute respiratory failure occurs when there is not enough oxygen passing from your lungs to your body. When this  happens, your lungs have trouble removing carbon dioxide from the blood. This causes your blood oxygen level to drop too low as carbon dioxide builds up. Acute respiratory failure is a medical emergency. It can develop quickly, but it is temporary if treated promptly. Your lung capacity, or how much air your lungs can hold, may improve with time, exercise, and treatment. What are the causes? There are many possible causes of acute respiratory failure, including:  Lung injury.  Chest injury or damage to the ribs or tissues near the lungs.  Lung conditions that affect the flow of air and blood into and out of the lungs, such as pneumonia, acute respiratory distress syndrome, and cystic fibrosis.  Medical conditions, such as strokes or spinal cord injuries, that affect the muscles and nerves that control breathing.  Blood infection (sepsis).  Inflammation of the pancreas (pancreatitis).  A blood clot in the lungs (pulmonary embolism).  A large-volume blood transfusion.  Burns.  Near-drowning.  Seizure.  Smoke inhalation.  Reaction to medicines.  Alcohol or drug overdose.  What increases the risk? This condition is more likely to develop in people who have:  A blocked airway.  Asthma.  A condition or disease that damages or weakens the muscles, nerves, bones, or tissues that are involved in breathing.  A serious infection.  A health problem that blocks the unconscious reflex that is involved in breathing, such as hypothyroidism or sleep apnea.  A lung injury or trauma.  What are the signs or symptoms? Trouble breathing is the main symptom of acute respiratory failure. Symptoms may also include:  Rapid breathing.  Restlessness or anxiety.  Skin, lips, or fingernails that appear blue (cyanosis).  Rapid heart rate.  Abnormal heart rhythms (arrhythmias).  Confusion or changes in behavior.  Tiredness or loss of energy.  Feeling sleepy or having a loss of  consciousness.  How is this diagnosed? Your health care provider can diagnose acute respiratory failure with a medical history and physical exam. During the exam, your health care provider will listen to your heart and check for crackling or wheezing sounds in your lungs. Your may also have tests to confirm the diagnosis and determine what is causing respiratory failure. These tests may include:  Measuring the amount of oxygen in your blood (pulse oximetry). The measurement comes from a small device that is placed on your finger, earlobe, or toe.  Other blood tests to measure blood gases and to look for signs of infection.  Sampling your cerebral spinal fluid or tracheal fluid to check for infections.  Chest X-ray to look for fluid in spaces that should be filled with air.  Electrocardiogram (ECG) to look at the heart's electrical activity.  How is this treated? Treatment for this condition usually takes places in a hospital intensive care unit (ICU). Treatment depends on what is causing the condition. It may include one or more treatments until your symptoms improve. Treatment may include:  Supplemental oxygen. Extra oxygen is given through a tube in the nose, a face mask, or a hood.  A device such as a continuous positive airway pressure (CPAP) or bi-level positive airway pressure (BiPAP or BPAP) machine. This treatment uses mild air pressure to keep the airways open. A mask or other device will be placed over your nose or mouth. A tube that is connected to a motor will deliver oxygen through the mask.  Ventilator. This treatment helps move air into and out of the lungs. This may be done with a bag and mask or a machine. For this treatment, a tube is placed in your windpipe (trachea) so air and oxygen can flow to the lungs.  Extracorporeal membrane oxygenation (ECMO). This treatment temporarily takes over the function of the heart and lungs, supplying oxygen and removing carbon dioxide.  ECMO gives the lungs a chance to recover. It may be used if a ventilator is not effective.  Tracheostomy. This is a procedure that creates a hole in the neck to insert a breathing tube.  Receiving fluids and medicines.  Rocking the bed to help breathing.  Follow these instructions at home:  Take over-the-counter and prescription medicines only as told by your health care provider.  Return to normal activities as told by your health care provider. Ask your health care provider what activities are safe for you.  Keep all follow-up visits as told by your health care provider. This is important. How is this prevented? Treating infections and medical conditions that may lead to acute respiratory failure can help prevent the condition from developing. Contact a health care provider if:  You have a fever.  Your symptoms do not improve or they get worse. Get help right away if:  You are having trouble breathing.  You lose consciousness.  Your have cyanosis or turn blue.  You develop a rapid  heart rate.  You are confused. These symptoms may represent a serious problem that is an emergency. Do not wait to see if the symptoms will go away. Get medical help right away. Call your local emergency services (911 in the [place] Do not drive yourself to the hospital. This information is not intended to replace advice given to you by your health care provider. Make sure you discuss any questions you have with your health care provider. Document Released: 10/02/2013 Document Revised: 04/24/2016 Document Reviewed: 04/14/2016 Elsevier Interactive Patient Education  2018 [employer].   Acute Respiratory Failure, Adult Acute respiratory failure occurs when there is not enough oxygen passing from your lungs to your body. When this happens, your lungs have trouble removing carbon dioxide from the blood. This causes your blood oxygen level to drop too low as carbon dioxide builds up. Acute respiratory  failure is a medical emergency. It can develop quickly, but it is temporary if treated promptly. Your lung capacity, or how much air your lungs can hold, may improve with time, exercise, and treatment. What are the causes? There are many possible causes of acute respiratory failure, including:  Lung injury.  Chest injury or damage to the ribs or tissues near the lungs.  Lung conditions that affect the flow of air and blood into and out of the lungs, such as pneumonia, acute respiratory distress syndrome, and cystic fibrosis.  Medical conditions, such as strokes or spinal cord injuries, that affect the muscles and nerves that control breathing.  Blood infection (sepsis).  Inflammation of the pancreas (pancreatitis).  A blood clot in the lungs (pulmonary embolism).  A large-volume blood transfusion.  Burns.  Near-drowning.  Seizure.  Smoke inhalation.  Reaction to medicines.  Alcohol or drug overdose.  What increases the risk? This condition is more likely to develop in people who have:  A blocked airway.  Asthma.  A condition or disease that damages or weakens the muscles, nerves, bones, or tissues that are involved in breathing.  A serious infection.  A health problem that blocks the unconscious reflex that is involved in breathing, such as hypothyroidism or sleep apnea.  A lung injury or trauma.  What are the signs or symptoms? Trouble breathing is the main symptom of acute respiratory failure. Symptoms may also include:  Rapid breathing.  Restlessness or anxiety.  Skin, lips, or fingernails that appear blue (cyanosis).  Rapid heart rate.  Abnormal heart rhythms (arrhythmias).  Confusion or changes in behavior.  Tiredness or loss of energy.  Feeling sleepy or having a loss of consciousness.  How is this diagnosed? Your health care provider can diagnose acute respiratory failure with a medical history and physical exam. During the exam, your health  care provider will listen to your heart and check for crackling or wheezing sounds in your lungs. Your may also have tests to confirm the diagnosis and determine what is causing respiratory failure. These tests may include:  Measuring the amount of oxygen in your blood (pulse oximetry). The measurement comes from a small device that is placed on your finger, earlobe, or toe.  Other blood tests to measure blood gases and to look for signs of infection.  Sampling your cerebral spinal fluid or tracheal fluid to check for infections.  Chest X-ray to look for fluid in spaces that should be filled with air.  Electrocardiogram (ECG) to look at the heart's electrical activity.  How is this treated? Treatment for this condition usually takes places in a hospital intensive  care unit (ICU). Treatment depends on what is causing the condition. It may include one or more treatments until your symptoms improve. Treatment may include:  Supplemental oxygen. Extra oxygen is given through a tube in the nose, a face mask, or a hood.  A device such as a continuous positive airway pressure (CPAP) or bi-level positive airway pressure (BiPAP or BPAP) machine. This treatment uses mild air pressure to keep the airways open. A mask or other device will be placed over your nose or mouth. A tube that is connected to a motor will deliver oxygen through the mask.  Ventilator. This treatment helps move air into and out of the lungs. This may be done with a bag and mask or a machine. For this treatment, a tube is placed in your windpipe (trachea) so air and oxygen can flow to the lungs.  Extracorporeal membrane oxygenation (ECMO). This treatment temporarily takes over the function of the heart and lungs, supplying oxygen and removing carbon dioxide. ECMO gives the lungs a chance to recover. It may be used if a ventilator is not effective.  Tracheostomy. This is a procedure that creates a hole in the neck to insert a breathing  tube.  Receiving fluids and medicines.  Rocking the bed to help breathing.  Follow these instructions at home:  Take over-the-counter and prescription medicines only as told by your health care provider.  Return to normal activities as told by your health care provider. Ask your health care provider what activities are safe for you.  Keep all follow-up visits as told by your health care provider. This is important. How is this prevented? Treating infections and medical conditions that may lead to acute respiratory failure can help prevent the condition from developing. Contact a health care provider if:  You have a fever.  Your symptoms do not improve or they get worse. Get help right away if:  You are having trouble breathing.  You lose consciousness.  Your have cyanosis or turn blue.  You develop a rapid heart rate.  You are confused. These symptoms may represent a serious problem that is an emergency. Do not wait to see if the symptoms will go away. Get medical help right away. Call your local emergency services (911 in the [place] Do not drive yourself to the hospital. This information is not intended to replace advice given to you by your health care provider. Make sure you discuss any questions you have with your health care provider. Document Released: 10/02/2013 Document Revised: 04/24/2016 Document Reviewed: 04/14/2016 Elsevier Interactive Patient Education  2018 [employer]. Constipation is very common after getting pain medication.  Drink plenty of water Take colace (stool softner) daily You may need to take a mild laxative (miralax) if you have not had a bowel movement in 2 days  Pain Control  Rib fractures hurt! First take tylenol (acetaminophen) - you can take 650 mg every 6 hours ( no more than 4000 mg per day) Alternate the tylenol with ibuprofen or motrin - follow package directions By alternating tylenol and ibuprofen, this will decrease how much  prescription narcotic you will need!!  If you still have pain, then you may take either your oxycodone or ultram - follow package directions      Rib Fracture A rib fracture is a break or crack in one of the bones of the ribs. The ribs are like a cage that goes around your upper chest. A broken or cracked rib is often painful, but most  do not cause other problems. Most rib fractures heal on their own in 1-3 months. Follow these instructions at home:  Avoid activities that cause pain to the injured area. Protect your injured area.  Slowly increase activity as told by your doctor.  Take medicine as told by your doctor.  Put ice on the injured area for the first 1-2 days after you have been treated or as told by your doctor. ? Put ice in a plastic bag. ? Place a towel between your skin and the bag. ? Leave the ice on for 15-20 minutes at a time, every 2 hours while you are awake.  Do deep breathing as told by your doctor. You may be told to: ? Take deep breaths many times a day. ? Cough many times a day while hugging a pillow. ? Use a device (incentive spirometer) to perform deep breathing many times a day.  Drink enough fluids to keep your pee (urine) clear or pale yellow.  Do not wear a rib belt or binder. These do not allow you to breathe deeply. Get help right away if:  You have a fever.  You have trouble breathing.  You cannot stop coughing.  You cough up thick or bloody spit (mucus).  You feel sick to your stomach (nauseous), throw up (vomit), or have belly (abdominal) pain.  Your pain gets worse and medicine does not help. This information is not intended to replace advice given to you by your health care provider. Make sure you discuss any questions you have with your health care provider. Document Released: 07/06/2008 Document Revised: 03/04/2016 Document Reviewed: 11/29/2012 Elsevier Interactive Patient Education  2018 [employer] An  incentive spirometer is a tool that measures how well you are filling your lungs with each breath. This tool can help keep your lungs clear and active. Taking long, deep breaths may help reverse or decrease the chance of developing breathing (pulmonary) problems, especially infection, following:  Surgery of the chest or abdomen.  Surgery if you have a history of smoking or a lung problem.  A long period of time when you are unable to move or be active.  If the spirometer includes an indicator to show your best effort, your health care provider or respiratory therapist will help you set a goal. Keep a log of your progress if directed by your health care provider. What are the risks?  Breathing too quickly may cause dizziness or cause you to pass out. Take your time so you do not get dizzy or lightheaded.  If you are in pain, you may need to take or ask for pain medicine before doing incentive spirometry. It is harder to take a deep breath if you are having pain. How to use your incentive spirometer 1. Sit on the edge of your bed if possible, or sit up as far as you can in bed or on a chair. 2. Hold the incentive spirometer in an upright position. 3. Breathe out normally. 4. Place the mouthpiece in your mouth and seal your lips tightly around it. 5. Breathe in slowly and as deeply as possible, raising the piston or the ball toward the top of the column. 6. Hold your breath for 3-5 seconds or for as long as possible. Allow the piston or ball to fall to the bottom of the column. 7. Remove the mouthpiece from your mouth and breathe out normally. 8. The spirometer may include an indicator to show your best effort. Use the  indicator as a goal to work toward during each repetition. 9. Rest for a few seconds and repeat this at least 10 times, every 1-2 hours when you are awake. Take your time and take a few normal breaths between deep breaths. Breathing too quickly may cause dizziness or cause you to  pass out. Take your time so you do not get dizzy or lightheaded. 10. After each set of 10 deep breaths, practice coughing to be sure your lungs are clear. If you had a surgical cut (incision) made during surgery, support your incision when coughing by placing a pillow or rolled-up towel firmly against it. Once you are able to get out of bed, walk around indoors and cough well. You may stop using the incentive spirometer when instructed by your health care provider. Contact a health care provider if:  You are having difficulty using the spirometer.  You have trouble using the spirometer as often as instructed.  Your pain medicine is not giving enough relief while using the spirometer.  You have a fever.  You develop shortness of breath. Get help right away if:  You develop a cough with bloody sputum.  You develop worsening pain, redness, or discharge at or near the incision site. This information is not intended to replace advice given to you by your health care provider. Make sure you discuss any questions you have with your health care provider. Document Released: 02/07/2007 Document Revised: 06/21/2016 Document Reviewed: 05/06/2014 Elsevier Interactive Patient Education  [employer].

## 2017-09-18 DIAGNOSIS — J449 Chronic obstructive pulmonary disease, unspecified: Secondary | ICD-10-CM | POA: Diagnosis not present

## 2017-09-20 DIAGNOSIS — S270XXD Traumatic pneumothorax, subsequent encounter: Secondary | ICD-10-CM | POA: Diagnosis not present

## 2017-09-20 DIAGNOSIS — S2241XD Multiple fractures of ribs, right side, subsequent encounter for fracture with routine healing: Secondary | ICD-10-CM | POA: Diagnosis not present

## 2017-09-20 DIAGNOSIS — J449 Chronic obstructive pulmonary disease, unspecified: Secondary | ICD-10-CM | POA: Diagnosis not present

## 2017-09-21 DIAGNOSIS — J438 Other emphysema: Secondary | ICD-10-CM | POA: Diagnosis not present

## 2017-09-21 DIAGNOSIS — S270XXD Traumatic pneumothorax, subsequent encounter: Secondary | ICD-10-CM | POA: Diagnosis not present

## 2017-09-21 DIAGNOSIS — J189 Pneumonia, unspecified organism: Secondary | ICD-10-CM | POA: Diagnosis not present

## 2017-09-21 DIAGNOSIS — S270XXS Traumatic pneumothorax, sequela: Secondary | ICD-10-CM | POA: Diagnosis not present

## 2017-09-21 DIAGNOSIS — W11XXXS Fall on and from ladder, sequela: Secondary | ICD-10-CM | POA: Diagnosis not present

## 2017-09-21 DIAGNOSIS — S2241XD Multiple fractures of ribs, right side, subsequent encounter for fracture with routine healing: Secondary | ICD-10-CM | POA: Diagnosis not present

## 2017-10-05 DIAGNOSIS — I2510 Atherosclerotic heart disease of native coronary artery without angina pectoris: Secondary | ICD-10-CM | POA: Diagnosis not present

## 2017-10-05 DIAGNOSIS — I10 Essential (primary) hypertension: Secondary | ICD-10-CM | POA: Diagnosis not present

## 2017-10-05 DIAGNOSIS — D692 Other nonthrombocytopenic purpura: Secondary | ICD-10-CM | POA: Diagnosis not present

## 2017-10-07 DIAGNOSIS — J939 Pneumothorax, unspecified: Secondary | ICD-10-CM | POA: Diagnosis not present

## 2017-10-07 DIAGNOSIS — S2241XD Multiple fractures of ribs, right side, subsequent encounter for fracture with routine healing: Secondary | ICD-10-CM | POA: Diagnosis not present

## 2017-10-17 DIAGNOSIS — J449 Chronic obstructive pulmonary disease, unspecified: Secondary | ICD-10-CM | POA: Diagnosis not present

## 2017-10-17 DIAGNOSIS — S270XXD Traumatic pneumothorax, subsequent encounter: Secondary | ICD-10-CM | POA: Diagnosis not present

## 2017-10-17 DIAGNOSIS — S2241XD Multiple fractures of ribs, right side, subsequent encounter for fracture with routine healing: Secondary | ICD-10-CM | POA: Diagnosis not present

## 2017-11-11 DIAGNOSIS — J441 Chronic obstructive pulmonary disease with (acute) exacerbation: Secondary | ICD-10-CM | POA: Diagnosis not present

## 2017-11-11 DIAGNOSIS — J181 Lobar pneumonia, unspecified organism: Secondary | ICD-10-CM | POA: Diagnosis not present

## 2017-11-11 DIAGNOSIS — R05 Cough: Secondary | ICD-10-CM | POA: Diagnosis not present

## 2017-11-18 ENCOUNTER — Other Ambulatory Visit
Admission: RE | Admit: 2017-11-18 | Discharge: 2017-11-18 | Disposition: A | Discharge status: Home / Self Care | Payer: 59 | Source: Ambulatory Visit | Attending: Internal Medicine | Admitting: Internal Medicine

## 2017-11-18 DIAGNOSIS — R0602 Shortness of breath: Secondary | ICD-10-CM | POA: Insufficient documentation

## 2017-11-18 DIAGNOSIS — R9389 Abnormal findings on diagnostic imaging of other specified body structures: Secondary | ICD-10-CM | POA: Diagnosis not present

## 2017-11-18 LAB — TROPONIN I: TROPONIN I: 0.03 ng/mL — AB (ref <0.03)

## 2017-11-21 ENCOUNTER — Other Ambulatory Visit: Payer: Self-pay | Admitting: Internal Medicine

## 2017-11-21 DIAGNOSIS — R0602 Shortness of breath: Secondary | ICD-10-CM

## 2017-11-21 DIAGNOSIS — R9389 Abnormal findings on diagnostic imaging of other specified body structures: Secondary | ICD-10-CM

## 2017-11-29 ENCOUNTER — Ambulatory Visit
Admission: RE | Admit: 2017-11-29 | Discharge: 2017-11-29 | Disposition: A | Discharge status: Home / Self Care | Payer: 59 | Source: Ambulatory Visit | Attending: Internal Medicine | Admitting: Internal Medicine

## 2017-11-29 DIAGNOSIS — J439 Emphysema, unspecified: Secondary | ICD-10-CM | POA: Insufficient documentation

## 2017-11-29 DIAGNOSIS — J90 Pleural effusion, not elsewhere classified: Secondary | ICD-10-CM | POA: Diagnosis not present

## 2017-11-29 DIAGNOSIS — J9811 Atelectasis: Secondary | ICD-10-CM | POA: Insufficient documentation

## 2017-11-29 DIAGNOSIS — I2510 Atherosclerotic heart disease of native coronary artery without angina pectoris: Secondary | ICD-10-CM | POA: Diagnosis not present

## 2017-11-29 DIAGNOSIS — R0602 Shortness of breath: Secondary | ICD-10-CM | POA: Diagnosis not present

## 2017-11-29 DIAGNOSIS — I700 Atherosclerosis of aorta: Secondary | ICD-10-CM | POA: Diagnosis not present

## 2017-11-29 DIAGNOSIS — S2241XD Multiple fractures of ribs, right side, subsequent encounter for fracture with routine healing: Secondary | ICD-10-CM | POA: Insufficient documentation

## 2017-11-29 DIAGNOSIS — X58XXXD Exposure to other specified factors, subsequent encounter: Secondary | ICD-10-CM | POA: Insufficient documentation

## 2017-11-29 DIAGNOSIS — R9389 Abnormal findings on diagnostic imaging of other specified body structures: Secondary | ICD-10-CM

## 2017-12-06 DIAGNOSIS — J9809 Other diseases of bronchus, not elsewhere classified: Secondary | ICD-10-CM | POA: Diagnosis not present

## 2017-12-06 DIAGNOSIS — R0602 Shortness of breath: Secondary | ICD-10-CM | POA: Diagnosis not present

## 2017-12-06 DIAGNOSIS — J90 Pleural effusion, not elsewhere classified: Secondary | ICD-10-CM | POA: Diagnosis not present

## 2017-12-06 DIAGNOSIS — J439 Emphysema, unspecified: Secondary | ICD-10-CM | POA: Diagnosis not present

## 2017-12-13 DIAGNOSIS — R0602 Shortness of breath: Secondary | ICD-10-CM | POA: Diagnosis not present

## 2017-12-13 DIAGNOSIS — J90 Pleural effusion, not elsewhere classified: Secondary | ICD-10-CM | POA: Diagnosis not present

## 2018-01-02 ENCOUNTER — Emergency Department: Payer: 59

## 2018-01-02 ENCOUNTER — Encounter: Payer: Self-pay | Admitting: Emergency Medicine

## 2018-01-02 ENCOUNTER — Emergency Department
Admission: EM | Admit: 2018-01-02 | Discharge: 2018-01-02 | Disposition: A | Discharge status: Home / Self Care | Payer: 59 | Attending: Emergency Medicine | Admitting: Emergency Medicine

## 2018-01-02 ENCOUNTER — Other Ambulatory Visit: Payer: Self-pay

## 2018-01-02 DIAGNOSIS — Z79899 Other long term (current) drug therapy: Secondary | ICD-10-CM | POA: Diagnosis not present

## 2018-01-02 DIAGNOSIS — Z7982 Long term (current) use of aspirin: Secondary | ICD-10-CM | POA: Insufficient documentation

## 2018-01-02 DIAGNOSIS — I2510 Atherosclerotic heart disease of native coronary artery without angina pectoris: Secondary | ICD-10-CM | POA: Insufficient documentation

## 2018-01-02 DIAGNOSIS — J449 Chronic obstructive pulmonary disease, unspecified: Secondary | ICD-10-CM | POA: Diagnosis not present

## 2018-01-02 DIAGNOSIS — R0602 Shortness of breath: Secondary | ICD-10-CM | POA: Diagnosis present

## 2018-01-02 DIAGNOSIS — F17210 Nicotine dependence, cigarettes, uncomplicated: Secondary | ICD-10-CM | POA: Diagnosis not present

## 2018-01-02 DIAGNOSIS — R0601 Orthopnea: Secondary | ICD-10-CM

## 2018-01-02 DIAGNOSIS — R0609 Other forms of dyspnea: Secondary | ICD-10-CM | POA: Diagnosis not present

## 2018-01-02 DIAGNOSIS — R0789 Other chest pain: Secondary | ICD-10-CM | POA: Insufficient documentation

## 2018-01-02 LAB — CBC
HCT: 40.3 % (ref 40.0–52.0)
Hemoglobin: 14.1 g/dL (ref 13.0–18.0)
MCH: 28.6 pg (ref 26.0–34.0)
MCHC: 34.9 g/dL (ref 32.0–36.0)
MCV: 82 fL (ref 80.0–100.0)
PLATELETS: 274 10*3/uL (ref 150–440)
RBC: 4.92 MIL/uL (ref 4.40–5.90)
RDW: 13.7 % (ref 11.5–14.5)
WBC: 8.6 10*3/uL (ref 3.8–10.6)

## 2018-01-02 LAB — BASIC METABOLIC PANEL
ANION GAP: 9 (ref 5–15)
BUN: 9 mg/dL (ref 6–20)
CALCIUM: 8.8 mg/dL — AB (ref 8.9–10.3)
CHLORIDE: 107 mmol/L (ref 101–111)
CO2: 22 mmol/L (ref 22–32)
Creatinine, Ser: 0.97 mg/dL (ref 0.61–1.24)
GFR calc non Af Amer: >60 mL/min (ref >60)
GLUCOSE: 117 mg/dL — AB (ref 65–99)
POTASSIUM: 3 mmol/L — AB (ref 3.5–5.1)
Sodium: 138 mmol/L (ref 135–145)

## 2018-01-02 LAB — TROPONIN I

## 2018-01-02 LAB — BRAIN NATRIURETIC PEPTIDE: B Natriuretic Peptide: 65 pg/mL (ref 0.0–100.0)

## 2018-01-02 MED ORDER — POTASSIUM CHLORIDE CRYS ER 20 MEQ PO TBCR
EXTENDED_RELEASE_TABLET | ORAL | Status: AC
  Filled 2018-01-02: qty 2

## 2018-01-02 MED ORDER — POTASSIUM CHLORIDE CRYS ER 20 MEQ PO TBCR
40.0000 meq | EXTENDED_RELEASE_TABLET | Freq: Once | ORAL | Status: AC
  Administered 2018-01-02: 40 meq via ORAL
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
## 2018-01-02 NOTE — ED Triage Notes (Signed)
Pt to ed with c/o sob that started this am.  Pt states he was not doing anything strenuous when the sob began and then became worse.  Pt reports sob is now better than when it started.  Pt states "it feels like I can't get enough air, especially when I lay down".    Pt denies chest pain at this time.

## 2018-01-02 NOTE — Discharge Instructions (Addendum)
Please return to the emergency department if you develop severe pain, lightheadedness or fainting, fever, palpitations, sweaty or clammy feeling, or any other symptoms concerning to you.

## 2018-01-03 DIAGNOSIS — E876 Hypokalemia: Secondary | ICD-10-CM | POA: Diagnosis not present

## 2018-01-03 DIAGNOSIS — R0602 Shortness of breath: Secondary | ICD-10-CM | POA: Diagnosis not present

## 2018-01-03 DIAGNOSIS — R918 Other nonspecific abnormal finding of lung field: Secondary | ICD-10-CM | POA: Diagnosis not present

## 2018-01-03 DIAGNOSIS — J449 Chronic obstructive pulmonary disease, unspecified: Secondary | ICD-10-CM | POA: Diagnosis not present

## 2018-01-11 DIAGNOSIS — I10 Essential (primary) hypertension: Secondary | ICD-10-CM | POA: Diagnosis not present

## 2018-01-11 DIAGNOSIS — E538 Deficiency of other specified B group vitamins: Secondary | ICD-10-CM | POA: Diagnosis not present

## 2018-01-18 DIAGNOSIS — J90 Pleural effusion, not elsewhere classified: Secondary | ICD-10-CM | POA: Diagnosis not present

## 2018-01-18 DIAGNOSIS — I10 Essential (primary) hypertension: Secondary | ICD-10-CM | POA: Diagnosis not present

## 2018-01-18 DIAGNOSIS — Z0000 Encounter for general adult medical examination without abnormal findings: Secondary | ICD-10-CM | POA: Diagnosis not present

## 2018-01-18 DIAGNOSIS — I2510 Atherosclerotic heart disease of native coronary artery without angina pectoris: Secondary | ICD-10-CM | POA: Diagnosis not present

## 2018-01-30 DIAGNOSIS — I2510 Atherosclerotic heart disease of native coronary artery without angina pectoris: Secondary | ICD-10-CM | POA: Diagnosis not present

## 2018-01-30 DIAGNOSIS — R0609 Other forms of dyspnea: Secondary | ICD-10-CM | POA: Diagnosis not present

## 2018-02-09 DIAGNOSIS — R972 Elevated prostate specific antigen [PSA]: Secondary | ICD-10-CM | POA: Diagnosis not present

## 2018-02-09 DIAGNOSIS — R748 Abnormal levels of other serum enzymes: Secondary | ICD-10-CM | POA: Diagnosis not present

## 2018-02-16 ENCOUNTER — Other Ambulatory Visit: Payer: Self-pay | Admitting: Internal Medicine

## 2018-02-16 DIAGNOSIS — I2510 Atherosclerotic heart disease of native coronary artery without angina pectoris: Secondary | ICD-10-CM | POA: Diagnosis not present

## 2018-02-16 DIAGNOSIS — I10 Essential (primary) hypertension: Secondary | ICD-10-CM | POA: Diagnosis not present

## 2018-02-16 DIAGNOSIS — D692 Other nonthrombocytopenic purpura: Secondary | ICD-10-CM | POA: Diagnosis not present

## 2018-02-16 DIAGNOSIS — R748 Abnormal levels of other serum enzymes: Secondary | ICD-10-CM

## 2018-02-22 ENCOUNTER — Ambulatory Visit
Admission: RE | Admit: 2018-02-22 | Discharge: 2018-02-22 | Disposition: A | Discharge status: Home / Self Care | Payer: 59 | Source: Ambulatory Visit | Attending: Internal Medicine | Admitting: Internal Medicine

## 2018-02-22 DIAGNOSIS — R748 Abnormal levels of other serum enzymes: Secondary | ICD-10-CM

## 2018-02-22 DIAGNOSIS — K8020 Calculus of gallbladder without cholecystitis without obstruction: Secondary | ICD-10-CM | POA: Insufficient documentation

## 2018-03-09 DIAGNOSIS — J90 Pleural effusion, not elsewhere classified: Secondary | ICD-10-CM | POA: Diagnosis not present

## 2018-03-09 DIAGNOSIS — R05 Cough: Secondary | ICD-10-CM | POA: Diagnosis not present

## 2018-03-09 DIAGNOSIS — R0609 Other forms of dyspnea: Secondary | ICD-10-CM | POA: Diagnosis not present

## 2018-03-14 ENCOUNTER — Other Ambulatory Visit: Payer: Self-pay

## 2018-03-14 ENCOUNTER — Encounter: Payer: 59 | Attending: Internal Medicine

## 2018-03-14 VITALS — Ht 66.5 in | Wt 179.1 lb

## 2018-03-14 DIAGNOSIS — Z79899 Other long term (current) drug therapy: Secondary | ICD-10-CM | POA: Diagnosis not present

## 2018-03-14 DIAGNOSIS — I2510 Atherosclerotic heart disease of native coronary artery without angina pectoris: Secondary | ICD-10-CM | POA: Insufficient documentation

## 2018-03-14 DIAGNOSIS — E538 Deficiency of other specified B group vitamins: Secondary | ICD-10-CM | POA: Diagnosis not present

## 2018-03-14 DIAGNOSIS — Z87891 Personal history of nicotine dependence: Secondary | ICD-10-CM | POA: Diagnosis not present

## 2018-03-14 DIAGNOSIS — Z79891 Long term (current) use of opiate analgesic: Secondary | ICD-10-CM | POA: Insufficient documentation

## 2018-03-14 DIAGNOSIS — J410 Simple chronic bronchitis: Secondary | ICD-10-CM | POA: Diagnosis not present

## 2018-03-14 DIAGNOSIS — M1990 Unspecified osteoarthritis, unspecified site: Secondary | ICD-10-CM | POA: Insufficient documentation

## 2018-03-14 DIAGNOSIS — K219 Gastro-esophageal reflux disease without esophagitis: Secondary | ICD-10-CM | POA: Diagnosis not present

## 2018-03-14 NOTE — Patient Instructions (Signed)
Patient Instructions  Patient Details  Name: Derrick Sheppard MRN: [id number] Date of Birth: [birth year] Referring Provider:  Adin Hector, MD  Below are your personal goals for exercise, nutrition, and risk factors. Our goal is to help you stay on track towards obtaining and maintaining these goals. We will be discussing your progress on these goals with you throughout the program.  Initial Exercise Prescription: Initial Exercise Prescription - 03/14/18 1300      Date of Initial Exercise RX and Referring Provider   Date  03/14/18      Treadmill   MPH  2.3    Grade  0.5    Minutes  15    METs  2.92      Recumbant Bike   Level  3    RPM  60    Watts  23    Minutes  15    METs  2.9      T5 Nustep   Level  2    SPM  80    Minutes  15    METs  2.9      Prescription Details   Frequency (times per week)  3    Duration  Progress to 45 minutes of aerobic exercise without signs/symptoms of physical distress      Intensity   THRR 40-80% of Max Heartrate  98-137    Ratings of Perceived Exertion  11-15    Perceived Dyspnea  0-4      Resistance Training   Training Prescription  Yes    Weight  4 lb    Reps  10-15       Exercise Goals: Frequency: Be able to perform aerobic exercise two to three times per week in program working toward 2-5 days per week of home exercise.  Intensity: Work with a perceived exertion of 11 (fairly light) - 15 (hard) while following your exercise prescription.  We will make changes to your prescription with you as you progress through the program.   Duration: Be able to do 30 to 45 minutes of continuous aerobic exercise in addition to a 5 minute warm-up and a 5 minute cool-down routine.   Nutrition Goals: Your personal nutrition goals will be established when you do your nutrition analysis with the dietician.  The following are general nutrition guidelines to follow: Cholesterol < 200mg /day Sodium < 1500mg /day Fiber: Men over 50 yrs - 30  grams per day  Personal Goals: Personal Goals and Risk Factors at Admission - 03/14/18 1149      Core Components/Risk Factors/Patient Goals on Admission    Weight Management  Yes;Weight Maintenance;Weight Loss    Intervention  Weight Management: Develop a combined nutrition and exercise program designed to reach desired caloric intake, while maintaining appropriate intake of nutrient and fiber, sodium and fats, and appropriate energy expenditure required for the weight goal.;Weight Management: Provide education and appropriate resources to help participant work on and attain dietary goals.;Weight Management/Obesity: Establish reasonable short term and long term weight goals.    Admit Weight  179 lb 1.6 oz (81.2 kg)    Goal Weight: Short Term  174 lb (78.9 kg)    Goal Weight: Long Term  170 lb (77.1 kg)    Expected Outcomes  Short Term: Continue to assess and modify interventions until short term weight is achieved;Long Term: Adherence to nutrition and physical activity/exercise program aimed toward attainment of established weight goal;Weight Maintenance: Understanding of the daily nutrition guidelines, which includes 25-35% calories from fat,  7% or less cal from saturated fats, less than 200mg  cholesterol, less than 1.5gm of sodium, & 5 or more servings of fruits and vegetables daily;Understanding recommendations for meals to include 15-35% energy as protein, 25-35% energy from fat, 35-60% energy from carbohydrates, less than 200mg  of dietary cholesterol, 20-35 gm of total fiber daily;Understanding of distribution of calorie intake throughout the day with the consumption of 4-5 meals/snacks    Improve shortness of breath with ADL's  Yes    Intervention  Provide education, individualized exercise plan and daily activity instruction to help decrease symptoms of SOB with activities of daily living.    Expected Outcomes  Short Term: Improve cardiorespiratory fitness to achieve a reduction of symptoms when  performing ADLs;Long Term: Be able to perform more ADLs without symptoms or delay the onset of symptoms    Hypertension  Yes    Intervention  Provide education on lifestyle modifcations including regular physical activity/exercise, weight management, moderate sodium restriction and increased consumption of fresh fruit, vegetables, and low fat dairy, alcohol moderation, and smoking cessation.;Monitor prescription use compliance.    Expected Outcomes  Short Term: Continued assessment and intervention until BP is < 140/78mm HG in hypertensive participants. < 130/29mm HG in hypertensive participants with diabetes, heart failure or chronic kidney disease.;Long Term: Maintenance of blood pressure at goal levels.       Tobacco Use Initial Evaluation: Social History   Tobacco Use  Smoking Status Former Smoker  . Packs/day: 1.50  . Years: 46.00  . Pack years: 69.00  . Types: Cigarettes  . Last attempt to quit: 09/01/2017  . Years since quitting: 0.5  Smokeless Tobacco Never Used    Exercise Goals and Review: Exercise Goals    Row Name 03/14/18 1320             Exercise Goals   Increase Physical Activity  Yes       Intervention  Provide advice, education, support and counseling about physical activity/exercise needs.;Develop an individualized exercise prescription for aerobic and resistive training based on initial evaluation findings, risk stratification, comorbidities and participant's personal goals.       Expected Outcomes  Short Term: Attend rehab on a regular basis to increase amount of physical activity.;Long Term: Add in home exercise to make exercise part of routine and to increase amount of physical activity.;Long Term: Exercising regularly at least 3-5 days a week.       Increase Strength and Stamina  Yes       Intervention  Provide advice, education, support and counseling about physical activity/exercise needs.;Develop an individualized exercise prescription for aerobic and  resistive training based on initial evaluation findings, risk stratification, comorbidities and participant's personal goals.       Expected Outcomes  Short Term: Increase workloads from initial exercise prescription for resistance, speed, and METs.;Short Term: Perform resistance training exercises routinely during rehab and add in resistance training at home;Long Term: Improve cardiorespiratory fitness, muscular endurance and strength as measured by increased METs and functional capacity (6MWT)       Able to understand and use rate of perceived exertion (RPE) scale  Yes       Intervention  Provide education and explanation on how to use RPE scale       Expected Outcomes  Short Term: Able to use RPE daily in rehab to express subjective intensity level;Long Term:  Able to use RPE to guide intensity level when exercising independently       Able to understand and use  Dyspnea scale  Yes       Intervention  Provide education and explanation on how to use Dyspnea scale       Expected Outcomes  Short Term: Able to use Dyspnea scale daily in rehab to express subjective sense of shortness of breath during exertion;Long Term: Able to use Dyspnea scale to guide intensity level when exercising independently       Knowledge and understanding of Target Heart Rate Range (THRR)  Yes       Intervention  Provide education and explanation of THRR including how the numbers were predicted and where they are located for reference       Expected Outcomes  Short Term: Able to state/look up THRR;Short Term: Able to use daily as guideline for intensity in rehab;Long Term: Able to use THRR to govern intensity when exercising independently       Able to check pulse independently  Yes       Intervention  Provide education and demonstration on how to check pulse in carotid and radial arteries.;Review the importance of being able to check your own pulse for safety during independent exercise       Expected Outcomes  Short Term: Able  to explain why pulse checking is important during independent exercise;Long Term: Able to check pulse independently and accurately       Understanding of Exercise Prescription  Yes       Intervention  Provide education, explanation, and written materials on patient's individual exercise prescription       Expected Outcomes  Short Term: Able to explain program exercise prescription;Long Term: Able to explain home exercise prescription to exercise independently          Copy of goals given to participant.

## 2018-03-14 NOTE — Progress Notes (Signed)
Pulmonary Individual Treatment Plan  Patient Details  Name: Derrick Sheppard MRN: [id number] Date of Birth: 1956-04-26 Referring Provider:    Initial Encounter Date:    Pulmonary Rehab from 03/14/2018 in [facility] Cardiac and Pulmonary Rehab  Date  03/14/18      Visit Diagnosis: Simple chronic bronchitis ([place])  Patient's Home Medications on Admission:  Current Outpatient Medications:  .  albuterol (PROVENTIL HFA;VENTOLIN HFA) 108 (90 Base) MCG/ACT inhaler, Inhale 2 puffs into the lungs every 6 (six) hours as needed for wheezing or shortness of breath., Disp: , Rfl:  .  aspirin EC 81 MG tablet, Take 81 mg by mouth daily., Disp: , Rfl:  .  B Complex-C (B-COMPLEX WITH VITAMIN C) tablet, Take 1 tablet by mouth daily., Disp: , Rfl:  .  Cholecalciferol 1000 units TBDP, Take 1,000 Units/day by mouth daily., Disp: , Rfl:  .  esomeprazole (NEXIUM) 40 MG capsule, Take 40 mg by mouth daily at 12 noon., Disp: , Rfl:  .  Fluticasone-Salmeterol (ADVAIR) 250-50 MCG/DOSE AEPB, Inhale 1 puff into the lungs 2 (two) times daily., Disp: , Rfl:  .  ibuprofen (ADVIL,MOTRIN) 200 MG tablet, Take 600 mg by mouth every 4 (four) hours as needed. , Disp: , Rfl:  .  losartan (COZAAR) 50 MG tablet, Take 50 mg by mouth daily., Disp: , Rfl:  .  oxyCODONE (OXY IR/ROXICODONE) 5 MG immediate release tablet, Take 1 tablet (5 mg total) by mouth every 4 (four) hours as needed (105m for moderate pain, 123mfor severe pain)., Disp: 30 tablet, Rfl: 0 .  polyethylene glycol (MIRALAX / GLYCOLAX) packet, Take 17 g by mouth daily., Disp: 14 each, Rfl: 0 .  tiotropium (SPIRIVA) 18 MCG inhalation capsule, Place 18 mcg into inhaler and inhale daily., Disp: , Rfl:  .  traMADol (ULTRAM) 50 MG tablet, Take 1 tablet (50 mg total) by mouth every 6 (six) hours., Disp: 30 tablet, Rfl: 0  Past Medical History: Past Medical History:  Diagnosis Date  . Allergic state   . Arthritis   . Asthma   . B12 deficiency   . COPD (chronic obstructive  pulmonary disease) ([place]  . Coronary artery disease   . Dysrhythmia   . Edema   . GERD (gastroesophageal reflux disease)   . Headache   . Palpitations   . Pulmonary nodules     Tobacco Use: Social History   Tobacco Use  Smoking Status Former Smoker  . Packs/day: 1.50  . Years: 46.00  . Pack years: 69.00  . Types: Cigarettes  . Last attempt to quit: 09/01/2017  . Years since quitting: 0.5  Smokeless Tobacco Never Used    Labs: Recent ReChemical engineer  Labs for ITP Cardiac and Pulmonary Rehab Latest Ref Rng & Units 09/07/2017 09/08/2017 09/10/2017 09/13/2017 09/16/2017   Trlycerides <150 mg/dL - - 73 72 -   PHART 7.350 - 7.450 7.479(H) 7.466(H) - - 7.453(H)   PCO2ART 32.0 - 48.0 mmHg 43.1 42.4 - - 32.2   HCO3 20.0 - 28.0 mmol/L 31.3(H) 30.2(H) - - 22.3   TCO2 22 - 32 mmol/L - - - - -   ACIDBASEDEF 0.0 - 2.0 mmol/L - - - - 1.2   O2SAT % 92.4 95.6 - - 92.4       Pulmonary Assessment Scores: Pulmonary Assessment Scores    Row Name 03/14/18 1110         ADL UCSD   ADL Phase  Entry     SOB  Score total  48     Rest  1     Walk  1     Stairs  3     Bath  3     Dress  1     Shop  2       CAT Score   CAT Score  15       mMRC Score   mMRC Score  1        Pulmonary Function Assessment: Pulmonary Function Assessment - 03/14/18 1135      Breath   Bilateral Breath Sounds  Clear    Shortness of Breath  Yes;Limiting activity       Exercise Target Goals: Date: 03/14/18  Exercise Program Goal: Individual exercise prescription set using results from initial 6 min walk test and THRR while considering  patient's activity barriers and safety.    Exercise Prescription Goal: Initial exercise prescription builds to 30-45 minutes a day of aerobic activity, 2-3 days per week.  Home exercise guidelines will be given to patient during program as part of exercise prescription that the participant will acknowledge.  Activity Barriers & Risk Stratification:   6  Minute Walk: 6 Minute Walk    Row Name 03/14/18 1320         6 Minute Walk   Distance  1214 feet     Walk Time  6 minutes     # of Rest Breaks  0     MPH  2.3     METS  3.15     RPE  11     Perceived Dyspnea   3     VO2 Peak  11.03     Symptoms  No     Resting HR  59 bpm     Resting BP  150/82     Resting Oxygen Saturation   96 %     Exercise Oxygen Saturation  during 6 min walk  91 %     Max Ex. HR  86 bpm     Max Ex. BP  166/84     2 Minute Post BP  152/84       Interval HR   1 Minute HR  71     2 Minute HR  84     4 Minute HR  86     5 Minute HR  84     6 Minute HR  86     2 Minute Post HR  65     Interval Heart Rate?  Yes       Interval Oxygen   Interval Oxygen?  Yes     Baseline Oxygen Saturation %  96 %     1 Minute Oxygen Saturation %  93 %     1 Minute Liters of Oxygen  0 L     2 Minute Oxygen Saturation %  91 %     2 Minute Liters of Oxygen  0 L     3 Minute Oxygen Saturation %  92 %     3 Minute Liters of Oxygen  0 L     4 Minute Oxygen Saturation %  92 %     4 Minute Liters of Oxygen  0 L     5 Minute Oxygen Saturation %  92 %     5 Minute Liters of Oxygen  0 L     6 Minute Oxygen Saturation %  93 %     6 Minute  Liters of Oxygen  0 L     2 Minute Post Oxygen Saturation %  97 %     2 Minute Post Liters of Oxygen  0 L       Oxygen Initial Assessment: Oxygen Initial Assessment - 03/14/18 1148      Home Oxygen   Home Oxygen Device  None    Sleep Oxygen Prescription  None    Home Exercise Oxygen Prescription  None    Home at Rest Exercise Oxygen Prescription  None      Initial 6 min Walk   Oxygen Used  None      Program Oxygen Prescription   Program Oxygen Prescription  None      Intervention   Short Term Goals  To learn and demonstrate proper use of respiratory medications;To learn and understand importance of maintaining oxygen saturations>88%;To learn and demonstrate proper pursed lip breathing techniques or other breathing techniques.;To  learn and understand importance of monitoring SPO2 with pulse oximeter and demonstrate accurate use of the pulse oximeter.    Long  Term Goals  Verbalizes importance of monitoring SPO2 with pulse oximeter and return demonstration;Maintenance of O2 saturations>88%;Exhibits proper breathing techniques, such as pursed lip breathing or other method taught during program session;Compliance with respiratory medication;Demonstrates proper use of MDI's       Oxygen Re-Evaluation:   Oxygen Discharge (Final Oxygen Re-Evaluation):   Initial Exercise Prescription: Initial Exercise Prescription - 03/14/18 1300      Date of Initial Exercise RX and Referring Provider   Date  03/14/18      Treadmill   MPH  2.3    Grade  0.5    Minutes  15    METs  2.92      Recumbant Bike   Level  3    RPM  60    Watts  23    Minutes  15    METs  2.9      T5 Nustep   Level  2    SPM  80    Minutes  15    METs  2.9      Prescription Details   Frequency (times per week)  3    Duration  Progress to 45 minutes of aerobic exercise without signs/symptoms of physical distress      Intensity   THRR 40-80% of Max Heartrate  98-137    Ratings of Perceived Exertion  11-15    Perceived Dyspnea  0-4      Resistance Training   Training Prescription  Yes    Weight  4 lb    Reps  10-15       Perform Capillary Blood Glucose checks as needed.  Exercise Prescription Changes:   Exercise Comments:   Exercise Goals and Review: Exercise Goals    Row Name 03/14/18 1320             Exercise Goals   Increase Physical Activity  Yes       Intervention  Provide advice, education, support and counseling about physical activity/exercise needs.;Develop an individualized exercise prescription for aerobic and resistive training based on initial evaluation findings, risk stratification, comorbidities and participant's personal goals.       Expected Outcomes  Short Term: Attend rehab on a regular basis to increase  amount of physical activity.;Long Term: Add in home exercise to make exercise part of routine and to increase amount of physical activity.;Long Term: Exercising regularly at least 3-5 days a week.  Increase Strength and Stamina  Yes       Intervention  Provide advice, education, support and counseling about physical activity/exercise needs.;Develop an individualized exercise prescription for aerobic and resistive training based on initial evaluation findings, risk stratification, comorbidities and participant's personal goals.       Expected Outcomes  Short Term: Increase workloads from initial exercise prescription for resistance, speed, and METs.;Short Term: Perform resistance training exercises routinely during rehab and add in resistance training at home;Long Term: Improve cardiorespiratory fitness, muscular endurance and strength as measured by increased METs and functional capacity (6MWT)       Able to understand and use rate of perceived exertion (RPE) scale  Yes       Intervention  Provide education and explanation on how to use RPE scale       Expected Outcomes  Short Term: Able to use RPE daily in rehab to express subjective intensity level;Long Term:  Able to use RPE to guide intensity level when exercising independently       Able to understand and use Dyspnea scale  Yes       Intervention  Provide education and explanation on how to use Dyspnea scale       Expected Outcomes  Short Term: Able to use Dyspnea scale daily in rehab to express subjective sense of shortness of breath during exertion;Long Term: Able to use Dyspnea scale to guide intensity level when exercising independently       Knowledge and understanding of Target Heart Rate Range (THRR)  Yes       Intervention  Provide education and explanation of THRR including how the numbers were predicted and where they are located for reference       Expected Outcomes  Short Term: Able to state/look up THRR;Short Term: Able to use  daily as guideline for intensity in rehab;Long Term: Able to use THRR to govern intensity when exercising independently       Able to check pulse independently  Yes       Intervention  Provide education and demonstration on how to check pulse in carotid and radial arteries.;Review the importance of being able to check your own pulse for safety during independent exercise       Expected Outcomes  Short Term: Able to explain why pulse checking is important during independent exercise;Long Term: Able to check pulse independently and accurately       Understanding of Exercise Prescription  Yes       Intervention  Provide education, explanation, and written materials on patient's individual exercise prescription       Expected Outcomes  Short Term: Able to explain program exercise prescription;Long Term: Able to explain home exercise prescription to exercise independently          Exercise Goals Re-Evaluation :   Discharge Exercise Prescription (Final Exercise Prescription Changes):   Nutrition:  Target Goals: Understanding of nutrition guidelines, daily intake of sodium <1565m, cholesterol <2031m calories 30% from fat and 7% or less from saturated fats, daily to have 5 or more servings of fruits and vegetables.  Biometrics: Pre Biometrics - 03/14/18 1319      Pre Biometrics   Height  5' 6.5" (1.689 m)    Weight  179 lb 1.6 oz (81.2 kg)    Waist Circumference  41 inches    Hip Circumference  39.5 inches    Waist to Hip Ratio  1.04 %    BMI (Calculated)  28.48  Nutrition Therapy Plan and Nutrition Goals: Nutrition Therapy & Goals - 03/14/18 1131      Personal Nutrition Goals   Comments  Lose some weight. Meet with the [place], educate and counsel regarding individualized specific dietary modifications aiming towards targeted core components such as weight, hypertension, lipid management, diabetes, heart failure and other  comorbidities.;Nutrition handout(s) given to patient.    Expected Outcomes  Short Term Goal: A plan has been developed with personal nutrition goals set during dietitian appointment.;Short Term Goal: Understand basic principles of dietary content, such as calories, fat, sodium, cholesterol and nutrients.;Long Term Goal: Adherence to prescribed nutrition plan.       Nutrition Assessments: Nutrition Assessments - 03/14/18 1111      MEDFICTS Scores   Pre Score  19       Nutrition Goals Re-Evaluation:   Nutrition Goals Discharge (Final Nutrition Goals Re-Evaluation):   Psychosocial: Target Goals: Acknowledge presence or absence of significant depression and/or stress, maximize coping skills, provide positive support system. Participant is able to verbalize types and ability to use techniques and skills needed for reducing stress and depression.   Initial Review & Psychosocial Screening: Initial Psych Review & Screening - 03/14/18 1128      Initial Review   Current issues with  Current Anxiety/Panic;Current Stress Concerns    Source of Stress Concerns  Chronic Illness      Family Dynamics   Good Support System?  Yes    Comments  His wife and Rozanna Boer Daughter are a good support system      Barriers   Psychosocial barriers to participate in program  The patient should benefit from training in stress management and relaxation.      Screening Interventions   Interventions  Encouraged to exercise;Provide feedback about the scores to participant;Program counselor consult;To provide support and resources with identified psychosocial needs    Expected Outcomes  Short Term goal: Utilizing psychosocial counselor, staff and physician to assist with identification of specific Stressors or current issues interfering with healing process. Setting desired goal for each stressor or current issue identified.;Long Term Goal: Stressors or current issues are controlled or eliminated.;Short Term goal:  Identification and review with participant of any Quality of Life or Depression concerns found by scoring the questionnaire.;Long Term goal: The participant improves quality of Life and PHQ9 Scores as seen by post scores and/or verbalization of changes       Quality of Life Scores:  Scores of 19 and below usually indicate a poorer quality of life in these areas.  A difference of  2-3 points is a clinically meaningful difference.  A difference of 2-3 points in the total score of the Quality of Life Index has been associated with significant improvement in overall quality of life, self-image, physical symptoms, and general health in studies assessing change in quality of life.  PHQ-9: Recent Review Flowsheet Data    Depression screen [facility] 2/9 03/14/2018   Decreased Interest 0   Down, Depressed, Hopeless 1   PHQ - 2 Score 1   Altered sleeping 0   Tired, decreased energy 1   Change in appetite 1   Feeling bad or failure about yourself  1   Trouble concentrating 0   Moving slowly or fidgety/restless 0   Suicidal thoughts 0   PHQ-9 Score 4   Difficult doing work/chores Not difficult at all     Interpretation of Total Score  Total Score  Depression Severity:  1-4 = Minimal depression, 5-9 = Mild depression, 10-14 = Moderate depression, 15-19 = Moderately severe depression, 20-27 = Severe depression   Psychosocial Evaluation and Intervention:   Psychosocial Re-Evaluation:   Psychosocial Discharge (Final Psychosocial Re-Evaluation):   Education: Education Goals: Education classes will be provided on a weekly basis, covering required topics. Participant will state understanding/return demonstration of topics presented.  Learning Barriers/Preferences: Learning Barriers/Preferences - 03/14/18 1137      Learning Barriers/Preferences   Learning Barriers  Sight wears reading glasses    Learning Preferences  None       Education Topics:  Initial Evaluation Education: - Verbal,  written and demonstration of respiratory meds, oximetry and breathing techniques. Instruction on use of nebulizers and MDIs and importance of monitoring MDI activations.   Pulmonary Rehab from 03/14/2018 in [facility] Cardiac and Pulmonary Rehab  Date  03/14/18  Educator  [facility]  Instruction Review Code  1- Verbalizes Understanding      General Nutrition Guidelines/Fats and Fiber: -Group instruction provided by verbal, written material, models and posters to present the general guidelines for heart healthy nutrition. Gives an explanation and review of dietary fats and fiber.   Controlling Sodium/Reading Food Labels: -Group verbal and written material supporting the discussion of sodium use in heart healthy nutrition. Review and explanation with models, verbal and written materials for utilization of the food label.   Exercise Physiology & General Exercise Guidelines: - Group verbal and written instruction with models to review the exercise physiology of the cardiovascular system and associated critical values. Provides general exercise guidelines with specific guidelines to those with heart or lung disease.    Aerobic Exercise & Resistance Training: - Gives group verbal and written instruction on the various components of exercise. Focuses on aerobic and resistive training programs and the benefits of this training and how to safely progress through these programs.   Flexibility, Balance, Mind/Body Relaxation: Provides group verbal/written instruction on the benefits of flexibility and balance training, including mind/body exercise modes such as yoga, pilates and tai chi.  Demonstration and skill practice provided.   Stress and Anxiety: - Provides group verbal and written instruction about the health risks of elevated stress and causes of high stress.  Discuss the correlation between heart/lung disease and anxiety and treatment options. Review healthy ways to manage with stress and  anxiety.   Depression: - Provides group verbal and written instruction on the correlation between heart/lung disease and depressed mood, treatment options, and the stigmas associated with seeking treatment.   Exercise & Equipment Safety: - Individual verbal instruction and demonstration of equipment use and safety with use of the equipment.   Pulmonary Rehab from 03/14/2018 in [facility] Cardiac and Pulmonary Rehab  Date  03/14/18  Educator  [facility]  Instruction Review Code  1- Verbalizes Understanding      Infection Prevention: - Provides verbal and written material to individual with discussion of infection control including proper hand washing and proper equipment cleaning during exercise session.   Pulmonary Rehab from 03/14/2018 in [facility] Cardiac and Pulmonary Rehab  Date  03/14/18  Educator  [facility]  Instruction Review Code  1- Verbalizes Understanding      Falls Prevention: - Provides verbal and written material to individual with discussion of falls prevention and safety.   Pulmonary Rehab from 03/14/2018 in [facility] Cardiac and Pulmonary Rehab  Date  03/14/18  Educator  [facility]  Instruction Review Code  1- Verbalizes Understanding      Diabetes: - Individual verbal  and written instruction to review signs/symptoms of diabetes, desired ranges of glucose level fasting, after meals and with exercise. Advice that pre and post exercise glucose checks will be done for 3 sessions at entry of program.   Chronic Lung Diseases: - Group verbal and written instruction to review updates, respiratory medications, advancements in procedures and treatments. Discuss use of supplemental oxygen including available portable oxygen systems, continuous and intermittent flow rates, concentrators, personal use and safety guidelines. Review proper use of inhaler and spacers. Provide informative websites for self-education.    Energy Conservation: - Provide group verbal and written instruction for methods to conserve  energy, plan and organize activities. Instruct on pacing techniques, use of adaptive equipment and posture/positioning to relieve shortness of breath.   Triggers and Exacerbations: - Group verbal and written instruction to review types of environmental triggers and ways to prevent exacerbations. Discuss weather changes, air quality and the benefits of nasal washing. Review warning signs and symptoms to help prevent infections. Discuss techniques for effective airway clearance, coughing, and vibrations.   AED/CPR: - Group verbal and written instruction with the use of models to demonstrate the basic use of the AED with the basic ABC's of resuscitation.   Anatomy and Physiology of the Lungs: - Group verbal and written instruction with the use of models to provide basic lung anatomy and physiology related to function, structure and complications of lung disease.   Anatomy & Physiology of the Heart: - Group verbal and written instruction and models provide basic cardiac anatomy and physiology, with the coronary electrical and arterial systems. Review of Valvular disease and Heart Failure   Cardiac Medications: - Group verbal and written instruction to review commonly prescribed medications for heart disease. Reviews the medication, class of the drug, and side effects.   Know Your Numbers and Risk Factors: -Group verbal and written instruction about important numbers in your health.  Discussion of what are risk factors and how they play a role in the disease process.  Review of Cholesterol, Blood Pressure, Diabetes, and BMI and the role they play in your overall health.   Sleep Hygiene: -Provides group verbal and written instruction about how sleep can affect your health.  Define sleep hygiene, discuss sleep cycles and impact of sleep habits. Review good sleep hygiene tips.    Other: -Provides group and verbal instruction on various topics (see comments)    Knowledge Questionnaire  Score: Knowledge Questionnaire Score - 03/14/18 1111      Knowledge Questionnaire Score   Pre Score  14/18 reviewed with patient        Core Components/Risk Factors/Patient Goals at Admission: Personal Goals and Risk Factors at Admission - 03/14/18 1149      Core Components/Risk Factors/Patient Goals on Admission    Weight Management  Yes;Weight Maintenance;Weight Loss    Intervention  Weight Management: Develop a combined nutrition and exercise program designed to reach desired caloric intake, while maintaining appropriate intake of nutrient and fiber, sodium and fats, and appropriate energy expenditure required for the weight goal.;Weight Management: Provide education and appropriate resources to help participant work on and attain dietary goals.;Weight Management/Obesity: Establish reasonable short term and long term weight goals.    Admit Weight  179 lb 1.6 oz (81.2 kg)    Goal Weight: Short Term  174 lb (78.9 kg)    Goal Weight: Long Term  170 lb (77.1 kg)    Expected Outcomes  Short Term: Continue to assess and modify interventions until short term weight  is achieved;Long Term: Adherence to nutrition and physical activity/exercise program aimed toward attainment of established weight goal;Weight Maintenance: Understanding of the daily nutrition guidelines, which includes 25-35% calories from fat, 7% or less cal from saturated fats, less than 256m cholesterol, less than 1.5gm of sodium, & 5 or more servings of fruits and vegetables daily;Understanding recommendations for meals to include 15-35% energy as protein, 25-35% energy from fat, 35-60% energy from carbohydrates, less than 2029mof dietary cholesterol, 20-35 gm of total fiber daily;Understanding of distribution of calorie intake throughout the day with the consumption of 4-5 meals/snacks    Improve shortness of breath with ADL's  Yes    Intervention  Provide education, individualized exercise plan and daily activity instruction to  help decrease symptoms of SOB with activities of daily living.    Expected Outcomes  Short Term: Improve cardiorespiratory fitness to achieve a reduction of symptoms when performing ADLs;Long Term: Be able to perform more ADLs without symptoms or delay the onset of symptoms    Hypertension  Yes    Intervention  Provide education on lifestyle modifcations including regular physical activity/exercise, weight management, moderate sodium restriction and increased consumption of fresh fruit, vegetables, and low fat dairy, alcohol moderation, and smoking cessation.;Monitor prescription use compliance.    Expected Outcomes  Short Term: Continued assessment and intervention until BP is < 140/903mG in hypertensive participants. < 130/45m30m in hypertensive participants with diabetes, heart failure or chronic kidney disease.;Long Term: Maintenance of blood pressure at goal levels.       Core Components/Risk Factors/Patient Goals Review:    Core Components/Risk Factors/Patient Goals at Discharge (Final Review):    ITP Comments: ITP Comments    Row Name 03/14/18 1051           ITP Comments  Medical Evaluation completed. Chart sent for review and changes to Dr. MarkEmily Filbertector of [facility]agn[facility] can be found [facility]          Comments: Initial ITP

## 2018-03-16 ENCOUNTER — Ambulatory Visit: Payer: Self-pay

## 2018-03-20 DIAGNOSIS — R748 Abnormal levels of other serum enzymes: Secondary | ICD-10-CM | POA: Diagnosis not present

## 2018-03-20 DIAGNOSIS — R972 Elevated prostate specific antigen [PSA]: Secondary | ICD-10-CM | POA: Diagnosis not present

## 2018-03-20 DIAGNOSIS — I2510 Atherosclerotic heart disease of native coronary artery without angina pectoris: Secondary | ICD-10-CM | POA: Diagnosis not present

## 2018-03-20 DIAGNOSIS — I10 Essential (primary) hypertension: Secondary | ICD-10-CM | POA: Diagnosis not present

## 2018-03-20 DIAGNOSIS — D692 Other nonthrombocytopenic purpura: Secondary | ICD-10-CM | POA: Diagnosis not present

## 2018-03-22 ENCOUNTER — Encounter: Payer: 59 | Admitting: *Deleted

## 2018-03-22 DIAGNOSIS — J410 Simple chronic bronchitis: Secondary | ICD-10-CM

## 2018-03-22 NOTE — Progress Notes (Signed)
Daily Session Note  Patient Details  Name: Derrick Sheppard MRN: [id number] Date of Birth: 04-26-1956 Referring Provider:    Encounter Date: 03/22/2018  Check In: Session Check In - 03/22/18 1138      Check-In   Location  [facility]    Staff Present  Justin Mend Lorre Nick, [place], RCEP, CCRP, Exercise Physiologist;Meredith Sherryll Burger, RN BSN    Supervising physician immediately available to respond to emergencies  [facility] immediately available ER MD    Physician(s)  Dr. Kerman Passey and Joni Fears    Medication changes reported      No    Fall or balance concerns reported     No    Tobacco Cessation  No Change    Warm-up and Cool-down  Performed as group-led instruction    Resistance Training Performed  Yes    VAD Patient?  No      Pain Assessment   Currently in Pain?  No/denies          Social History   Tobacco Use  Smoking Status Former Smoker  . Packs/day: 1.50  . Years: 46.00  . Pack years: 69.00  . Types: Cigarettes  . Last attempt to quit: 09/01/2017  . Years since quitting: 0.5  Smokeless Tobacco Never Used    Goals Met:  Proper associated with RPD/PD & O2 Sat Independence with exercise equipment Using PLB without cueing & demonstrates good technique Exercise tolerated well No report of cardiac concerns or symptoms Strength training completed today  Goals Unmet:  Not Applicable  Comments: First full day of exercise!  Patient was oriented to gym and equipment including functions, settings, policies, and procedures.  Patient's individual exercise prescription and treatment plan were reviewed.  All starting workloads were established based on the results of the 6 minute walk test done at initial orientation visit.  The plan for exercise progression was also introduced and progression will be customized based on patient's performance and goals.    Dr. Emily Filbert is Medical Director for [place] and  [facility].

## 2018-03-24 DIAGNOSIS — J410 Simple chronic bronchitis: Secondary | ICD-10-CM | POA: Diagnosis not present

## 2018-03-24 NOTE — Progress Notes (Signed)
Daily Session Note  Patient Details  Name: KAMERON BLETHEN MRN: [id number] Date of Birth: [birth year] Referring Provider:    Encounter Date: 03/24/2018  Check In: Session Check In - 03/24/18 1135      Check-In   Location  [facility]    Staff Present  Justin Mend Lorre Nick, [place], RCEP, CCRP, Exercise Physiologist;Meredith Sherryll Burger, RN BSN    Supervising physician immediately available to respond to emergencies  [facility] immediately available ER MD    Physician(s)  Dr. Corky Downs and Joni Fears    Medication changes reported      No    Fall or balance concerns reported     No    Tobacco Cessation  No Change    Warm-up and Cool-down  Performed as group-led instruction    Resistance Training Performed  Yes    VAD Patient?  No      Pain Assessment   Currently in Pain?  No/denies          Social History   Tobacco Use  Smoking Status Former Smoker  . Packs/day: 1.50  . Years: 46.00  . Pack years: 69.00  . Types: Cigarettes  . Last attempt to quit: 09/01/2017  . Years since quitting: 0.5  Smokeless Tobacco Never Used    Goals Met:  Independence with exercise equipment Exercise tolerated well No report of cardiac concerns or symptoms Strength training completed today  Goals Unmet:  Not Applicable  Comments: Pt able to follow exercise prescription today without complaint.  Will continue to monitor for progression.   Dr. Emily Filbert is Medical Director for [place] and [facility].

## 2018-03-27 DIAGNOSIS — J410 Simple chronic bronchitis: Secondary | ICD-10-CM

## 2018-03-27 NOTE — Progress Notes (Signed)
Daily Session Note  Patient Details  Name: Derrick Sheppard MRN: [id number] Date of Birth: 1956-07-15 Referring Provider:    Encounter Date: 03/27/2018  Check In: Session Check In - 03/27/18 1155      Check-In   Location  [facility]    Staff Present  Justin Mend RCP,RRT,BSRT;Amanda Oletta Darter, BA, ACSM CEP, Exercise Physiologist;Kelly Amedeo Plenty, BS, ACSM CEP, Exercise Physiologist    Supervising physician immediately available to respond to emergencies  [facility] immediately available ER MD    Physician(s)  Dr. Corky Downs and Clearnce Hasten    Medication changes reported      No    Fall or balance concerns reported     No    Tobacco Cessation  No Change    Warm-up and Cool-down  Performed as group-led instruction    Resistance Training Performed  Yes    VAD Patient?  No      Pain Assessment   Currently in Pain?  No/denies          Social History   Tobacco Use  Smoking Status Former Smoker  . Packs/day: 1.50  . Years: 46.00  . Pack years: 69.00  . Types: Cigarettes  . Last attempt to quit: 09/01/2017  . Years since quitting: 0.5  Smokeless Tobacco Never Used    Goals Met:  Independence with exercise equipment Exercise tolerated well No report of cardiac concerns or symptoms Strength training completed today  Goals Unmet:  Not Applicable  Comments: Pt able to follow exercise prescription today without complaint.  Will continue to monitor for progression.   Dr. Emily Filbert is Medical Director for [place] and [facility].

## 2018-03-29 ENCOUNTER — Encounter: Payer: 59 | Admitting: *Deleted

## 2018-03-29 DIAGNOSIS — J410 Simple chronic bronchitis: Secondary | ICD-10-CM

## 2018-03-29 NOTE — Progress Notes (Signed)
Daily Session Note  Patient Details  Name: Derrick Sheppard MRN: [id number] Date of Birth: 1955/12/05 Referring Provider:    Encounter Date: 03/29/2018  Check In: Session Check In - 03/29/18 1141      Check-In   Location  [facility]    Staff Present  Justin Mend Lorre Nick, [place], RCEP, CCRP, Exercise Physiologist;Donshay Lupinski Sherryll Burger, RN BSN    Supervising physician immediately available to respond to emergencies  [facility] immediately available ER MD    Physician(s)  Dr. Jimmye Norman and Corky Downs    Medication changes reported      No    Fall or balance concerns reported     No    Tobacco Cessation  No Change    Warm-up and Cool-down  Performed as group-led instruction    Resistance Training Performed  Yes    VAD Patient?  No      Pain Assessment   Currently in Pain?  No/denies        Exercise Prescription Changes - 03/28/18 1400      Response to Exercise   Blood Pressure (Admit)  144/80    Blood Pressure (Exercise)  178/82    Blood Pressure (Exit)  140/80    Heart Rate (Admit)  74 bpm    Heart Rate (Exercise)  112 bpm    Heart Rate (Exit)  91 bpm    Oxygen Saturation (Admit)  94 %    Oxygen Saturation (Exercise)  91 %    Oxygen Saturation (Exit)  93 %    Rating of Perceived Exertion (Exercise)  15    Perceived Dyspnea (Exercise)  3    Symptoms  none    Comments  third full day of exercise    Duration  Progress to 45 minutes of aerobic exercise without signs/symptoms of physical distress    Intensity  THRR unchanged      Progression   Progression  Continue to progress workloads to maintain intensity without signs/symptoms of physical distress.    Average METs  2.57      Resistance Training   Training Prescription  Yes    Weight  4 lbs    Reps  10-15      Interval Training   Interval Training  No      Treadmill   MPH  2.3    Grade  0.5    Minutes  15    METs  2.92      Recumbant Bike   Level  3    RPM  61    Watts  23    Minutes  15    METs  2.98      T5 Nustep   Level  2    SPM  75    Minutes  15    METs  1.8       Social History   Tobacco Use  Smoking Status Former Smoker  . Packs/day: 1.50  . Years: 46.00  . Pack years: 69.00  . Types: Cigarettes  . Last attempt to quit: 09/01/2017  . Years since quitting: 0.5  Smokeless Tobacco Never Used    Goals Met:  Proper associated with RPD/PD & O2 Sat Independence with exercise equipment Using PLB without cueing & demonstrates good technique Exercise tolerated well No report of cardiac concerns or symptoms Strength training completed today  Goals Unmet:  Not Applicable  Comments: Pt able to follow exercise prescription today without complaint.  Will continue to monitor for progression. Reviewed  home exercise with pt today.  Pt plans to walk and go to gym at church for exercise.  Reviewed THR, pulse, RPE, sign and symptoms, and when to call 911 or MD.  Also discussed weather considerations and indoor options.  Pt voiced understanding.   Dr. Emily Filbert is Medical Director for [place] and [facility].

## 2018-03-31 DIAGNOSIS — J410 Simple chronic bronchitis: Secondary | ICD-10-CM

## 2018-03-31 NOTE — Progress Notes (Signed)
Daily Session Note  Patient Details  Name: Derrick Sheppard MRN: [id number] Date of Birth: [birth year] Referring Provider:    Encounter Date: 03/31/2018  Check In: Session Check In - 03/31/18 1130      Check-In   Location  [facility]    Staff Present  Justin Mend Lorre Nick, [place], RCEP, CCRP, Exercise Physiologist;Meredith Sherryll Burger, RN BSN    Supervising physician immediately available to respond to emergencies  [facility] immediately available ER MD    Physician(s)  Dr. Corky Downs and Alfred Levins    Medication changes reported      No    Fall or balance concerns reported     No    Tobacco Cessation  No Change    Warm-up and Cool-down  Performed as group-led instruction    Resistance Training Performed  Yes    VAD Patient?  No      Pain Assessment   Currently in Pain?  No/denies          Social History   Tobacco Use  Smoking Status Former Smoker  . Packs/day: 1.50  . Years: 46.00  . Pack years: 69.00  . Types: Cigarettes  . Last attempt to quit: 09/01/2017  . Years since quitting: 0.5  Smokeless Tobacco Never Used    Goals Met:  Independence with exercise equipment Exercise tolerated well No report of cardiac concerns or symptoms Strength training completed today  Goals Unmet:  Not Applicable  Comments: Pt able to follow exercise prescription today without complaint.  Will continue to monitor for progression.   Dr. Emily Filbert is Medical Director for [place] and [facility].

## 2018-04-03 DIAGNOSIS — J410 Simple chronic bronchitis: Secondary | ICD-10-CM

## 2018-04-03 NOTE — Progress Notes (Signed)
Daily Session Note  Patient Details  Name: Derrick Sheppard MRN: [id number] Date of Birth: 03-Aug-1956 Referring Provider:    Encounter Date: 04/03/2018  Check In: Session Check In - 04/03/18 1124      Check-In   Location  [facility]    Staff Present  Justin Mend Jaci Carrel, BS, ACSM CEP, Exercise Physiologist;Amanda Oletta Darter, [place], ACSM CEP, Exercise Physiologist    Supervising physician immediately available to respond to emergencies  [facility] immediately available ER MD    Physician(s)  Dr. Jimmye Norman and Cinda Quest    Medication changes reported      No    Fall or balance concerns reported     No    Tobacco Cessation  No Change    Warm-up and Cool-down  Performed as group-led instruction    Resistance Training Performed  Yes    VAD Patient?  No      Pain Assessment   Currently in Pain?  No/denies          Social History   Tobacco Use  Smoking Status Former Smoker  . Packs/day: 1.50  . Years: 46.00  . Pack years: 69.00  . Types: Cigarettes  . Last attempt to quit: 09/01/2017  . Years since quitting: 0.5  Smokeless Tobacco Never Used    Goals Met:  Independence with exercise equipment Exercise tolerated well No report of cardiac concerns or symptoms Strength training completed today  Goals Unmet:  Not Applicable  Comments: Pt able to follow exercise prescription today without complaint.  Will continue to monitor for progression.   Dr. Emily Filbert is Medical Director for [place] and [facility].

## 2018-04-05 ENCOUNTER — Encounter: Payer: 59 | Admitting: *Deleted

## 2018-04-05 DIAGNOSIS — J410 Simple chronic bronchitis: Secondary | ICD-10-CM | POA: Diagnosis not present

## 2018-04-05 NOTE — Progress Notes (Signed)
Daily Session Note  Patient Details  Name: Derrick Sheppard MRN: [id number] Date of Birth: 1956/02/17 Referring Provider:    Encounter Date: 04/05/2018  Check In: Session Check In - 04/05/18 1127      Check-In   Location  [facility]    Staff Present  Renita Papa, RN BSN;Jessica Luan Pulling, MA, RCEP, CCRP, Exercise Physiologist;Joseph Flavia Shipper    Supervising physician immediately available to respond to emergencies  [facility] immediately available ER MD    Physician(s)  Dr. Jimmye Norman and Quentin Cornwall    Medication changes reported      No    Fall or balance concerns reported     No    Tobacco Cessation  No Change    Warm-up and Cool-down  Performed as group-led instruction    Resistance Training Performed  Yes    VAD Patient?  No    PAD/SET Patient?  No      Pain Assessment   Currently in Pain?  No/denies          Social History   Tobacco Use  Smoking Status Former Smoker  . Packs/day: 1.50  . Years: 46.00  . Pack years: 69.00  . Types: Cigarettes  . Last attempt to quit: 09/01/2017  . Years since quitting: 0.5  Smokeless Tobacco Never Used    Goals Met:  Proper associated with RPD/PD & O2 Sat Independence with exercise equipment Using PLB without cueing & demonstrates good technique Exercise tolerated well No report of cardiac concerns or symptoms Strength training completed today  Goals Unmet:  Not Applicable  Comments: Pt able to follow exercise prescription today without complaint.  Will continue to monitor for progression.    Dr. Emily Filbert is Medical Director for [place] and [facility].

## 2018-04-07 ENCOUNTER — Ambulatory Visit (INDEPENDENT_AMBULATORY_CARE_PROVIDER_SITE_OTHER): Payer: Self-pay | Admitting: Urology

## 2018-04-07 ENCOUNTER — Encounter: Payer: 59 | Admitting: *Deleted

## 2018-04-07 ENCOUNTER — Encounter: Payer: Self-pay | Admitting: Urology

## 2018-04-07 VITALS — BP 162/92 | HR 70 | Wt 176.8 lb

## 2018-04-07 DIAGNOSIS — J410 Simple chronic bronchitis: Secondary | ICD-10-CM

## 2018-04-07 DIAGNOSIS — R972 Elevated prostate specific antigen [PSA]: Secondary | ICD-10-CM | POA: Diagnosis not present

## 2018-04-07 DIAGNOSIS — N400 Enlarged prostate without lower urinary tract symptoms: Secondary | ICD-10-CM

## 2018-04-07 LAB — URINALYSIS, COMPLETE
BILIRUBIN UA: NEGATIVE
GLUCOSE, UA: NEGATIVE
KETONES UA: NEGATIVE
LEUKOCYTES UA: NEGATIVE
Nitrite, UA: NEGATIVE
PROTEIN UA: NEGATIVE
RBC UA: NEGATIVE
Urobilinogen, Ur: 0.2 mg/dL (ref 0.2–1.0)
pH, UA: 5.5 (ref 5.0–7.5)

## 2018-04-07 NOTE — Progress Notes (Signed)
Daily Session Note  Patient Details  Name: DARREK LEASURE MRN: [id number] Date of Birth: 1956-06-09 Referring Provider:    Encounter Date: 04/07/2018  Check In: Session Check In - 04/07/18 1125      Check-In   Location  [facility]    Staff Present  Justin Mend Lorre Nick, [place], RCEP, CCRP, Exercise Physiologist;Ameliah Baskins Sherryll Burger, RN BSN    Supervising physician immediately available to respond to emergencies  [facility] immediately available ER MD    Physician(s)  Dr. Cherylann Banas and Jacqualine Code     Medication changes reported      No    Fall or balance concerns reported     No    Tobacco Cessation  No Change    Warm-up and Cool-down  Performed as group-led instruction    Resistance Training Performed  Yes    VAD Patient?  No    PAD/SET Patient?  No      Pain Assessment   Currently in Pain?  No/denies          Social History   Tobacco Use  Smoking Status Former Smoker  . Packs/day: 1.50  . Years: 46.00  . Pack years: 69.00  . Types: Cigarettes  . Last attempt to quit: 09/01/2017  . Years since quitting: 0.5  Smokeless Tobacco Never Used    Goals Met:  Proper associated with RPD/PD & O2 Sat Independence with exercise equipment Using PLB without cueing & demonstrates good technique Exercise tolerated well No report of cardiac concerns or symptoms Strength training completed today  Goals Unmet:  Not Applicable  Comments: Pt able to follow exercise prescription today without complaint.  Will continue to monitor for progression.    Dr. Emily Filbert is Medical Director for [place] and [facility].

## 2018-04-07 NOTE — Progress Notes (Signed)
04/07/2018 9:14 AM   Derrick Sheppard [birth year] [id number]  Referring provider: Adin Hector, MD [facility] [place] [facility], [place] [address]  Chief Complaint  Patient presents with  . Elevated PSA    HPI:  Consultation for PSA elevation.  Patient underwent PSA screening in May 2019 which revealed a PSA of 5.6.  A follow-up PSA June 2019 was 4.76.  He underwent prior CT scan of the abdomen and pelvis November 2018 which revealed about a 70 g prostate. PSA history: March 2017 PSA 3.32 May 2019 PSA 5.16 March 2018 PSA 4.76  No Family history prostate cancer. AUASS = 4. He admits to ED. UA clear.    PMH: Past Medical History:  Diagnosis Date  . Allergic state   . Arthritis   . Asthma   . B12 deficiency   . COPD (chronic obstructive pulmonary disease) ([place])   . Coronary artery disease   . Dysrhythmia   . Edema   . GERD (gastroesophageal reflux disease)   . Headache   . Palpitations   . Pulmonary nodules     Surgical History: Past Surgical History:  Procedure Laterality Date  . COLONOSCOPY    . COLONOSCOPY WITH PROPOFOL N/A 04/09/2016   Procedure: COLONOSCOPY WITH PROPOFOL;  Surgeon: Lollie Sails, MD;  Location: [facility] ENDOSCOPY;  Service: Endoscopy;  Laterality: N/A;    Home Medications:  Allergies as of 04/07/2018      Reactions   Amlodipine Swelling   Makes his ankles and legs swell.   Codeine Other (See Comments)   Gives him a Headache.      Medication List        Accurate as of 04/07/18  9:14 AM. Always use your most recent med list.          albuterol 108 (90 Base) MCG/ACT inhaler Commonly known as:  PROVENTIL HFA;VENTOLIN HFA Inhale 2 puffs into the lungs every 6 (six) hours as needed for wheezing or shortness of breath.   aspirin EC 81 MG tablet Take 81 mg by mouth daily.   B-complex with vitamin C tablet Take 1 tablet by mouth daily.   Cholecalciferol 1000 units Tbdp Take 1,000 Units/day by mouth daily.     esomeprazole 40 MG capsule Commonly known as:  NEXIUM Take 40 mg by mouth daily at 12 noon.   ibuprofen 200 MG tablet Commonly known as:  ADVIL,MOTRIN Take 600 mg by mouth every 4 (four) hours as needed.   losartan 50 MG tablet Commonly known as:  COZAAR Take 50 mg by mouth daily.   TRELEGY ELLIPTA 100-62.5-25 MCG/INH Aepb Generic drug:  Fluticasone-Umeclidin-Vilant Inhale into the lungs.       Allergies:  Allergies  Allergen Reactions  . Amlodipine Swelling    Makes his ankles and legs swell.  . Codeine Other (See Comments)    Gives him a Headache.    Family History: History reviewed. No pertinent family history.  Social History:  reports that he quit smoking about 7 months ago. His smoking use included cigarettes. He has a 69.00 pack-year smoking history. He has never used smokeless tobacco. He reports that he does not drink alcohol or use drugs.  ROS:                                        Physical Exam: BP (!) 162/92 (BP Location: Right Arm,  Patient Position: Sitting, Cuff Size: Normal)   Pulse 70   Wt 80.2 kg (176 lb 12.8 oz)   BMI 28.11 kg/m   Constitutional:  Alert and oriented, No acute distress. HEENT: [place] AT, moist mucus membranes.  Trachea midline, no masses. Cardiovascular: No clubbing, cyanosis, or edema. Respiratory: Normal respiratory effort, no increased work of breathing. GI: Abdomen is soft, nontender, nondistended, no abdominal masses GU: No CVA tenderness DRE: prostate 50 g and benign. No hard area or nodule. Lymph: No cervical or inguinal lymphadenopathy. Skin: No rashes, bruises or suspicious lesions. Neurologic: Grossly intact, no focal deficits, moving all 4 extremities. Psychiatric: Normal mood and affect.  Laboratory Data: Lab Results  Component Value Date   WBC 8.6 01/02/2018   HGB 14.1 01/02/2018   HCT 40.3 01/02/2018   MCV 82.0 01/02/2018   PLT 274 01/02/2018    Lab Results  Component Value Date    CREATININE 0.97 01/02/2018    No results found for: PSA  No results found for: TESTOSTERONE  No results found for: HGBA1C  Urinalysis    Component Value Date/Time   COLORURINE AMBER (A) 09/02/2017 1823   APPEARANCEUR HAZY (A) 09/02/2017 1823   LABSPEC 1.020 09/02/2017 1823   PHURINE 5.0 09/02/2017 1823   GLUCOSEU NEGATIVE 09/02/2017 1823   HGBUR MODERATE (A) 09/02/2017 1823   BILIRUBINUR NEGATIVE 09/02/2017 [place] 09/02/2017 1823   PROTEINUR 30 (A) 09/02/2017 1823   NITRITE NEGATIVE 09/02/2017 1823   LEUKOCYTESUR NEGATIVE 09/02/2017 1823    Lab Results  Component Value Date   BACTERIA NONE SEEN 09/02/2017     No results found for this or any previous visit. No results found for this or any previous visit. No results found for this or any previous visit. No results found for this or any previous visit. No results found for this or any previous visit. No results found for this or any previous visit. No results found for this or any previous visit. No results found for this or any previous visit.  Assessment & Plan:    1. Elevated PSA .The natural history of prostate cancer and ongoing controversy regarding screening and potential treatment outcomes of prostate cancer has been discussed with the patient. The meaning of a false positive PSA and a false negative PSA has been discussed. We discussed the nature r/b of other lab tests, surveillance, biopsy and MRI. We will set up for another PSA and if rising consider biopsy. His PSAD is normal and PSA coming.   2. BPH - discussed surveillance, meds and procedures. AUASS low. Will continue surveillance.   - Urinalysis, Complete   No follow-ups on file.  Festus Aloe, MD  [facility] [address] [place], [place] [address] [phone]

## 2018-04-10 ENCOUNTER — Encounter: Payer: 59 | Attending: Internal Medicine

## 2018-04-10 DIAGNOSIS — K219 Gastro-esophageal reflux disease without esophagitis: Secondary | ICD-10-CM | POA: Diagnosis not present

## 2018-04-10 DIAGNOSIS — E538 Deficiency of other specified B group vitamins: Secondary | ICD-10-CM | POA: Insufficient documentation

## 2018-04-10 DIAGNOSIS — J410 Simple chronic bronchitis: Secondary | ICD-10-CM | POA: Insufficient documentation

## 2018-04-10 DIAGNOSIS — Z79899 Other long term (current) drug therapy: Secondary | ICD-10-CM | POA: Diagnosis not present

## 2018-04-10 DIAGNOSIS — I2510 Atherosclerotic heart disease of native coronary artery without angina pectoris: Secondary | ICD-10-CM | POA: Diagnosis not present

## 2018-04-10 DIAGNOSIS — Z87891 Personal history of nicotine dependence: Secondary | ICD-10-CM | POA: Diagnosis not present

## 2018-04-10 DIAGNOSIS — M1990 Unspecified osteoarthritis, unspecified site: Secondary | ICD-10-CM | POA: Diagnosis not present

## 2018-04-10 DIAGNOSIS — Z79891 Long term (current) use of opiate analgesic: Secondary | ICD-10-CM | POA: Insufficient documentation

## 2018-04-10 NOTE — Progress Notes (Signed)
Pulmonary Individual Treatment Plan  Patient Details  Name: Derrick Sheppard MRN: [id number] Date of Birth: [birth year] Referring Provider:    Initial Encounter Date:    Pulmonary Rehab from 03/14/2018 in [facility] Cardiac and Pulmonary Rehab  Date  03/14/18      Visit Diagnosis: Simple chronic bronchitis ([place])  Patient's Home Medications on Admission:  Current Outpatient Medications:  .  albuterol (PROVENTIL HFA;VENTOLIN HFA) 108 (90 Base) MCG/ACT inhaler, Inhale 2 puffs into the lungs every 6 (six) hours as needed for wheezing or shortness of breath., Disp: , Rfl:  .  aspirin EC 81 MG tablet, Take 81 mg by mouth daily., Disp: , Rfl:  .  B Complex-C (B-COMPLEX WITH VITAMIN C) tablet, Take 1 tablet by mouth daily., Disp: , Rfl:  .  Cholecalciferol 1000 units TBDP, Take 1,000 Units/day by mouth daily., Disp: , Rfl:  .  esomeprazole (NEXIUM) 40 MG capsule, Take 40 mg by mouth daily at 12 noon., Disp: , Rfl:  .  Fluticasone-Umeclidin-Vilant (TRELEGY ELLIPTA) 100-62.5-25 MCG/INH AEPB, Inhale into the lungs., Disp: , Rfl:  .  ibuprofen (ADVIL,MOTRIN) 200 MG tablet, Take 600 mg by mouth every 4 (four) hours as needed. , Disp: , Rfl:  .  losartan (COZAAR) 50 MG tablet, Take 50 mg by mouth daily., Disp: , Rfl:   Past Medical History: Past Medical History:  Diagnosis Date  . Allergic state   . Arthritis   . Asthma   . B12 deficiency   . COPD (chronic obstructive pulmonary disease) ([place])   . Coronary artery disease   . Dysrhythmia   . Edema   . GERD (gastroesophageal reflux disease)   . Headache   . Palpitations   . Pulmonary nodules     Tobacco Use: Social History   Tobacco Use  Smoking Status Former Smoker  . Packs/day: 1.50  . Years: 46.00  . Pack years: 69.00  . Types: Cigarettes  . Last attempt to quit: 09/01/2017  . Years since quitting: 0.6  Smokeless Tobacco Never Used    Labs: Recent Review Flowsheet Data    Labs for ITP Cardiac and Pulmonary Rehab Latest Ref Rng &  Units 09/07/2017 09/08/2017 09/10/2017 09/13/2017 09/16/2017   Trlycerides <150 mg/dL - - 73 72 -   PHART 7.350 - 7.450 7.479(H) 7.466(H) - - 7.453(H)   PCO2ART 32.0 - 48.0 mmHg 43.1 42.4 - - 32.2   HCO3 20.0 - 28.0 mmol/L 31.3(H) 30.2(H) - - 22.3   TCO2 22 - 32 mmol/L - - - - -   ACIDBASEDEF 0.0 - 2.0 mmol/L - - - - 1.2   O2SAT % 92.4 95.6 - - 92.4       Pulmonary Assessment Scores: Pulmonary Assessment Scores    Row Name 03/14/18 1110         ADL UCSD   ADL Phase  Entry     SOB Score total  48     Rest  1     Walk  1     Stairs  3     Bath  3     Dress  1     Shop  2       CAT Score   CAT Score  15       mMRC Score   mMRC Score  1        Pulmonary Function Assessment: Pulmonary Function Assessment - 03/14/18 1135      Breath   Bilateral Breath Sounds  Clear  Shortness of Breath  Yes;Limiting activity       Exercise Target Goals:    Exercise Program Goal: Individual exercise prescription set using results from initial 6 min walk test and THRR while considering  patient's activity barriers and safety.    Exercise Prescription Goal: Initial exercise prescription builds to 30-45 minutes a day of aerobic activity, 2-3 days per week.  Home exercise guidelines will be given to patient during program as part of exercise prescription that the participant will acknowledge.  Activity Barriers & Risk Stratification:   6 Minute Walk: 6 Minute Walk    Row Name 03/14/18 1320         6 Minute Walk   Distance  1214 feet     Walk Time  6 minutes     # of Rest Breaks  0     MPH  2.3     METS  3.15     RPE  11     Perceived Dyspnea   3     VO2 Peak  11.03     Symptoms  No     Resting HR  59 bpm     Resting BP  150/82     Resting Oxygen Saturation   96 %     Exercise Oxygen Saturation  during 6 min walk  91 %     Max Ex. HR  86 bpm     Max Ex. BP  166/84     2 Minute Post BP  152/84       Interval HR   1 Minute HR  71     2 Minute HR  84     4 Minute  HR  86     5 Minute HR  84     6 Minute HR  86     2 Minute Post HR  65     Interval Heart Rate?  Yes       Interval Oxygen   Interval Oxygen?  Yes     Baseline Oxygen Saturation %  96 %     1 Minute Oxygen Saturation %  93 %     1 Minute Liters of Oxygen  0 L     2 Minute Oxygen Saturation %  91 %     2 Minute Liters of Oxygen  0 L     3 Minute Oxygen Saturation %  92 %     3 Minute Liters of Oxygen  0 L     4 Minute Oxygen Saturation %  92 %     4 Minute Liters of Oxygen  0 L     5 Minute Oxygen Saturation %  92 %     5 Minute Liters of Oxygen  0 L     6 Minute Oxygen Saturation %  93 %     6 Minute Liters of Oxygen  0 L     2 Minute Post Oxygen Saturation %  97 %     2 Minute Post Liters of Oxygen  0 L       Oxygen Initial Assessment: Oxygen Initial Assessment - 03/14/18 1148      Home Oxygen   Home Oxygen Device  None    Sleep Oxygen Prescription  None    Home Exercise Oxygen Prescription  None    Home at Rest Exercise Oxygen Prescription  None      Initial 6 min Walk   Oxygen Used  None  Program Oxygen Prescription   Program Oxygen Prescription  None      Intervention   Short Term Goals  To learn and demonstrate proper use of respiratory medications;To learn and understand importance of maintaining oxygen saturations>88%;To learn and demonstrate proper pursed lip breathing techniques or other breathing techniques.;To learn and understand importance of monitoring SPO2 with pulse oximeter and demonstrate accurate use of the pulse oximeter.    Long  Term Goals  Verbalizes importance of monitoring SPO2 with pulse oximeter and return demonstration;Maintenance of O2 saturations>88%;Exhibits proper breathing techniques, such as pursed lip breathing or other method taught during program session;Compliance with respiratory medication;Demonstrates proper use of MDI's       Oxygen Re-Evaluation: Oxygen Re-Evaluation    Row Name 03/22/18 1140              Goals/Expected Outcomes   Short Term Goals  To learn and demonstrate proper use of respiratory medications;To learn and understand importance of maintaining oxygen saturations>88%;To learn and demonstrate proper pursed lip breathing techniques or other breathing techniques.;To learn and understand importance of monitoring SPO2 with pulse oximeter and demonstrate accurate use of the pulse oximeter.       Long  Term Goals  Verbalizes importance of monitoring SPO2 with pulse oximeter and return demonstration;Maintenance of O2 saturations>88%;Exhibits proper breathing techniques, such as pursed lip breathing or other method taught during program session;Compliance with respiratory medication;Demonstrates proper use of MDI's       Comments  Reviewed PLB technique with pt.  Talked about how it work and it's important to maintaining his exercise saturations.         Goals/Expected Outcomes  Short: Become more profiecient at using PLB.   Long: Become independent at using PLB.          Oxygen Discharge (Final Oxygen Re-Evaluation): Oxygen Re-Evaluation - 03/22/18 1140      Goals/Expected Outcomes   Short Term Goals  To learn and demonstrate proper use of respiratory medications;To learn and understand importance of maintaining oxygen saturations>88%;To learn and demonstrate proper pursed lip breathing techniques or other breathing techniques.;To learn and understand importance of monitoring SPO2 with pulse oximeter and demonstrate accurate use of the pulse oximeter.    Long  Term Goals  Verbalizes importance of monitoring SPO2 with pulse oximeter and return demonstration;Maintenance of O2 saturations>88%;Exhibits proper breathing techniques, such as pursed lip breathing or other method taught during program session;Compliance with respiratory medication;Demonstrates proper use of MDI's    Comments  Reviewed PLB technique with pt.  Talked about how it work and it's important to maintaining his exercise  saturations.      Goals/Expected Outcomes  Short: Become more profiecient at using PLB.   Long: Become independent at using PLB.       Initial Exercise Prescription: Initial Exercise Prescription - 03/14/18 1300      Date of Initial Exercise RX and Referring Provider   Date  03/14/18      Treadmill   MPH  2.3    Grade  0.5    Minutes  15    METs  2.92      Recumbant Bike   Level  3    RPM  60    Watts  23    Minutes  15    METs  2.9      T5 Nustep   Level  2    SPM  80    Minutes  15    METs  2.9  Prescription Details   Frequency (times per week)  3    Duration  Progress to 45 minutes of aerobic exercise without signs/symptoms of physical distress      Intensity   THRR 40-80% of Max Heartrate  98-137    Ratings of Perceived Exertion  11-15    Perceived Dyspnea  0-4      Resistance Training   Training Prescription  Yes    Weight  4 lb    Reps  10-15       Perform Capillary Blood Glucose checks as needed.  Exercise Prescription Changes: Exercise Prescription Changes    Row Name 03/28/18 1400 03/29/18 1200           Response to Exercise   Blood Pressure (Admit)  144/80  -      Blood Pressure (Exercise)  178/82  -      Blood Pressure (Exit)  140/80  -      Heart Rate (Admit)  74 bpm  -      Heart Rate (Exercise)  112 bpm  -      Heart Rate (Exit)  91 bpm  -      Oxygen Saturation (Admit)  94 %  -      Oxygen Saturation (Exercise)  91 %  -      Oxygen Saturation (Exit)  93 %  -      Rating of Perceived Exertion (Exercise)  15  -      Perceived Dyspnea (Exercise)  3  -      Symptoms  none  -      Comments  third full day of exercise  -      Duration  Progress to 45 minutes of aerobic exercise without signs/symptoms of physical distress  -      Intensity  THRR unchanged  -        Progression   Progression  Continue to progress workloads to maintain intensity without signs/symptoms of physical distress.  -      Average METs  2.57  -         Resistance Training   Training Prescription  Yes  -      Weight  4 lbs  -      Reps  10-15  -        Interval Training   Interval Training  No  -        Treadmill   MPH  2.3  -      Grade  0.5  -      Minutes  15  -      METs  2.92  -        Recumbant Bike   Level  3  -      RPM  61  -      Watts  23  -      Minutes  15  -      METs  2.98  -        T5 Nustep   Level  2  -      SPM  75  -      Minutes  15  -      METs  1.8  -        Home Exercise Plan   Plans to continue exercise at  -  [employer] (comment) church gym      Frequency  -  Add 1 additional day to program exercise  sessions.      Initial Home Exercises Provided  -  03/29/18         Exercise Comments: Exercise Comments    Row Name 03/22/18 1139           Exercise Comments  First full day of exercise!  Patient was oriented to gym and equipment including functions, settings, policies, and procedures.  Patient's individual exercise prescription and treatment plan were reviewed.  All starting workloads were established based on the results of the 6 minute walk test done at initial orientation visit.  The plan for exercise progression was also introduced and progression will be customized based on patient's performance and goals          Exercise Goals and Review: Exercise Goals    Row Name 03/14/18 1320             Exercise Goals   Increase Physical Activity  Yes       Intervention  Provide advice, education, support and counseling about physical activity/exercise needs.;Develop an individualized exercise prescription for aerobic and resistive training based on initial evaluation findings, risk stratification, comorbidities and participant's personal goals.       Expected Outcomes  Short Term: Attend rehab on a regular basis to increase amount of physical activity.;Long Term: Add in home exercise to make exercise part of routine and to increase amount of physical activity.;Long Term: Exercising  regularly at least 3-5 days a week.       Increase Strength and Stamina  Yes       Intervention  Provide advice, education, support and counseling about physical activity/exercise needs.;Develop an individualized exercise prescription for aerobic and resistive training based on initial evaluation findings, risk stratification, comorbidities and participant's personal goals.       Expected Outcomes  Short Term: Increase workloads from initial exercise prescription for resistance, speed, and METs.;Short Term: Perform resistance training exercises routinely during rehab and add in resistance training at home;Long Term: Improve cardiorespiratory fitness, muscular endurance and strength as measured by increased METs and functional capacity (6MWT)       Able to understand and use rate of perceived exertion (RPE) scale  Yes       Intervention  Provide education and explanation on how to use RPE scale       Expected Outcomes  Short Term: Able to use RPE daily in rehab to express subjective intensity level;Long Term:  Able to use RPE to guide intensity level when exercising independently       Able to understand and use Dyspnea scale  Yes       Intervention  Provide education and explanation on how to use Dyspnea scale       Expected Outcomes  Short Term: Able to use Dyspnea scale daily in rehab to express subjective sense of shortness of breath during exertion;Long Term: Able to use Dyspnea scale to guide intensity level when exercising independently       Knowledge and understanding of Target Heart Rate Range (THRR)  Yes       Intervention  Provide education and explanation of THRR including how the numbers were predicted and where they are located for reference       Expected Outcomes  Short Term: Able to state/look up THRR;Short Term: Able to use daily as guideline for intensity in rehab;Long Term: Able to use THRR to govern intensity when exercising independently       Able to check pulse independently  Yes  Intervention  Provide education and demonstration on how to check pulse in carotid and radial arteries.;Review the importance of being able to check your own pulse for safety during independent exercise       Expected Outcomes  Short Term: Able to explain why pulse checking is important during independent exercise;Long Term: Able to check pulse independently and accurately       Understanding of Exercise Prescription  Yes       Intervention  Provide education, explanation, and written materials on patient's individual exercise prescription       Expected Outcomes  Short Term: Able to explain program exercise prescription;Long Term: Able to explain home exercise prescription to exercise independently          Exercise Goals Re-Evaluation : Exercise Goals Re-Evaluation    Row Name 03/22/18 1139 03/28/18 1410 03/29/18 1249         Exercise Goal Re-Evaluation   Exercise Goals Review  Able to understand and use rate of perceived exertion (RPE) scale;Able to understand and use Dyspnea scale;Knowledge and understanding of Target Heart Rate Range (THRR);Understanding of Exercise Prescription  Increase Physical Activity;Increase Strength and Stamina;Understanding of Exercise Prescription  Increase Physical Activity;Able to understand and use rate of perceived exertion (RPE) scale;Knowledge and understanding of Target Heart Rate Range (THRR);Understanding of Exercise Prescription;Increase Strength and Stamina;Able to understand and use Dyspnea scale;Able to check pulse independently     Comments  Reviewed RPE scale, THR and program prescription with pt today.  Pt voiced understanding and was given a copy of goals to take home.   Jemari is off to a good start in rehab. He is already up to 2.3 mph consistently for 15 min each day.  We will continue to work on his progress.   Reviewed home exercise with pt today.  Pt plans to walk and go to gym at church for exercise.  Reviewed THR, pulse, RPE, sign and  symptoms, and when to call 911 or MD.  Also discussed weather considerations and indoor options.  Pt voiced understanding.     Expected Outcomes  Short: Use RPE daily to regulate intensity.  Long: Follow program prescription in THR.  Short: Start to increase workloads.  Long: Continue to attend rehab regularly.   Short: Add in at least one extra day a week next week.  Long: Continue to exercise more at home.         Discharge Exercise Prescription (Final Exercise Prescription Changes): Exercise Prescription Changes - 03/29/18 1200      Home Exercise Plan   Plans to continue exercise at  [employer] (comment) church gym    Frequency  Add 1 additional day to program exercise sessions.    Initial Home Exercises Provided  03/29/18       Nutrition:  Target Goals: Understanding of nutrition guidelines, daily intake of sodium <1530m, cholesterol <2089m calories 30% from fat and 7% or less from saturated fats, daily to have 5 or more servings of fruits and vegetables.  Biometrics: Pre Biometrics - 03/14/18 1319      Pre Biometrics   Height  5' 6.5" (1.689 m)    Weight  179 lb 1.6 oz (81.2 kg)    Waist Circumference  41 inches    Hip Circumference  39.5 inches    Waist to Hip Ratio  1.04 %    BMI (Calculated)  28.48        Nutrition Therapy Plan and Nutrition Goals: Nutrition Therapy & Goals - 03/14/18 1131  Personal Nutrition Goals   Comments  Lose some weight. Meet with the [place], educate and counsel regarding individualized specific dietary modifications aiming towards targeted core components such as weight, hypertension, lipid management, diabetes, heart failure and other comorbidities.;Nutrition handout(s) given to patient.    Expected Outcomes  Short Term Goal: A plan has been developed with personal nutrition goals set during dietitian appointment.;Short Term Goal: Understand basic principles of dietary content,  such as calories, fat, sodium, cholesterol and nutrients.;Long Term Goal: Adherence to prescribed nutrition plan.       Nutrition Assessments: Nutrition Assessments - 03/14/18 1111      MEDFICTS Scores   Pre Score  19       Nutrition Goals Re-Evaluation:   Nutrition Goals Discharge (Final Nutrition Goals Re-Evaluation):   Psychosocial: Target Goals: Acknowledge presence or absence of significant depression and/or stress, maximize coping skills, provide positive support system. Participant is able to verbalize types and ability to use techniques and skills needed for reducing stress and depression.   Initial Review & Psychosocial Screening: Initial Psych Review & Screening - 03/14/18 1128      Initial Review   Current issues with  Current Anxiety/Panic;Current Stress Concerns    Source of Stress Concerns  Chronic Illness      Family Dynamics   Good Support System?  Yes    Comments  His wife and Rozanna Boer Daughter are a good support system      Barriers   Psychosocial barriers to participate in program  The patient should benefit from training in stress management and relaxation.      Screening Interventions   Interventions  Encouraged to exercise;Provide feedback about the scores to participant;Program counselor consult;To provide support and resources with identified psychosocial needs    Expected Outcomes  Short Term goal: Utilizing psychosocial counselor, staff and physician to assist with identification of specific Stressors or current issues interfering with healing process. Setting desired goal for each stressor or current issue identified.;Long Term Goal: Stressors or current issues are controlled or eliminated.;Short Term goal: Identification and review with participant of any Quality of Life or Depression concerns found by scoring the questionnaire.;Long Term goal: The participant improves quality of Life and PHQ9 Scores as seen by post scores and/or verbalization of changes        Quality of Life Scores:  Scores of 19 and below usually indicate a poorer quality of life in these areas.  A difference of  2-3 points is a clinically meaningful difference.  A difference of 2-3 points in the total score of the Quality of Life Index has been associated with significant improvement in overall quality of life, self-image, physical symptoms, and general health in studies assessing change in quality of life.  PHQ-9: Recent Review Flowsheet Data    Depression screen [facility] 2/9 03/14/2018   Decreased Interest 0   Down, Depressed, Hopeless 1   PHQ - 2 Score 1   Altered sleeping 0   Tired, decreased energy 1   Change in appetite 1   Feeling bad or failure about yourself  1   Trouble concentrating 0   Moving slowly or fidgety/restless 0   Suicidal thoughts 0   PHQ-9 Score 4   Difficult doing work/chores Not difficult at all     Interpretation of Total Score  Total Score Depression Severity:  1-4 = Minimal depression, 5-9 = Mild depression, 10-14 = Moderate depression, 15-19 = Moderately  severe depression, 20-27 = Severe depression   Psychosocial Evaluation and Intervention:   Psychosocial Re-Evaluation:   Psychosocial Discharge (Final Psychosocial Re-Evaluation):   Education: Education Goals: Education classes will be provided on a weekly basis, covering required topics. Participant will state understanding/return demonstration of topics presented.  Learning Barriers/Preferences: Learning Barriers/Preferences - 03/14/18 1137      Learning Barriers/Preferences   Learning Barriers  Sight wears reading glasses    Learning Preferences  None       Education Topics:  Initial Evaluation Education: - Verbal, written and demonstration of respiratory meds, oximetry and breathing techniques. Instruction on use of nebulizers and MDIs and importance of monitoring MDI activations.   Pulmonary Rehab from 04/05/2018 in [facility] Cardiac and Pulmonary Rehab  Date  03/14/18   Educator  [facility]  Instruction Review Code  1- Verbalizes Understanding      General Nutrition Guidelines/Fats and Fiber: -Group instruction provided by verbal, written material, models and posters to present the general guidelines for heart healthy nutrition. Gives an explanation and review of dietary fats and fiber.   Controlling Sodium/Reading Food Labels: -Group verbal and written material supporting the discussion of sodium use in heart healthy nutrition. Review and explanation with models, verbal and written materials for utilization of the food label.   Exercise Physiology & General Exercise Guidelines: - Group verbal and written instruction with models to review the exercise physiology of the cardiovascular system and associated critical values. Provides general exercise guidelines with specific guidelines to those with heart or lung disease.    Pulmonary Rehab from 04/05/2018 in [facility] Cardiac and Pulmonary Rehab  Date  03/31/18  Educator  [facility]  Instruction Review Code  1- Verbalizes Understanding      Aerobic Exercise & Resistance Training: - Gives group verbal and written instruction on the various components of exercise. Focuses on aerobic and resistive training programs and the benefits of this training and how to safely progress through these programs.   Flexibility, Balance, Mind/Body Relaxation: Provides group verbal/written instruction on the benefits of flexibility and balance training, including mind/body exercise modes such as yoga, pilates and tai chi.  Demonstration and skill practice provided.   Stress and Anxiety: - Provides group verbal and written instruction about the health risks of elevated stress and causes of high stress.  Discuss the correlation between heart/lung disease and anxiety and treatment options. Review healthy ways to manage with stress and anxiety.   Depression: - Provides group verbal and written instruction on the correlation between heart/lung  disease and depressed mood, treatment options, and the stigmas associated with seeking treatment.   Exercise & Equipment Safety: - Individual verbal instruction and demonstration of equipment use and safety with use of the equipment.   Pulmonary Rehab from 04/05/2018 in [facility] Cardiac and Pulmonary Rehab  Date  03/14/18  Educator  [facility]  Instruction Review Code  1- Verbalizes Understanding      Infection Prevention: - Provides verbal and written material to individual with discussion of infection control including proper hand washing and proper equipment cleaning during exercise session.   Pulmonary Rehab from 04/05/2018 in [facility] Cardiac and Pulmonary Rehab  Date  03/14/18  Educator  [facility]  Instruction Review Code  1- Verbalizes Understanding      Falls Prevention: - Provides verbal and written material to individual with discussion of falls prevention and safety.   Pulmonary Rehab from 04/05/2018 in [facility] Cardiac and Pulmonary Rehab  Date  03/14/18  Educator  [facility]  Instruction Review Code  1- Verbalizes Understanding      Diabetes: - Individual verbal and written instruction to review signs/symptoms of diabetes, desired ranges of glucose level fasting, after meals and with exercise. Advice that pre and post exercise glucose checks will be done for 3 sessions at entry of program.   Chronic Lung Diseases: - Group verbal and written instruction to review updates, respiratory medications, advancements in procedures and treatments. Discuss use of supplemental oxygen including available portable oxygen systems, continuous and intermittent flow rates, concentrators, personal use and safety guidelines. Review proper use of inhaler and spacers. Provide informative websites for self-education.    Pulmonary Rehab from 04/05/2018 in [facility] Cardiac and Pulmonary Rehab  Date  03/29/18  Educator  [facility]  Instruction Review Code  1- Verbalizes Understanding      Energy Conservation: - Provide group verbal and  written instruction for methods to conserve energy, plan and organize activities. Instruct on pacing techniques, use of adaptive equipment and posture/positioning to relieve shortness of breath.   Triggers and Exacerbations: - Group verbal and written instruction to review types of environmental triggers and ways to prevent exacerbations. Discuss weather changes, air quality and the benefits of nasal washing. Review warning signs and symptoms to help prevent infections. Discuss techniques for effective airway clearance, coughing, and vibrations.   AED/CPR: - Group verbal and written instruction with the use of models to demonstrate the basic use of the AED with the basic ABC's of resuscitation.   Anatomy and Physiology of the Lungs: - Group verbal and written instruction with the use of models to provide basic lung anatomy and physiology related to function, structure and complications of lung disease.   Anatomy & Physiology of the Heart: - Group verbal and written instruction and models provide basic cardiac anatomy and physiology, with the coronary electrical and arterial systems. Review of Valvular disease and Heart Failure   Cardiac Medications: - Group verbal and written instruction to review commonly prescribed medications for heart disease. Reviews the medication, class of the drug, and side effects.   Know Your Numbers and Risk Factors: -Group verbal and written instruction about important numbers in your health.  Discussion of what are risk factors and how they play a role in the disease process.  Review of Cholesterol, Blood Pressure, Diabetes, and BMI and the role they play in your overall health.   Sleep Hygiene: -Provides group verbal and written instruction about how sleep can affect your health.  Define sleep hygiene, discuss sleep cycles and impact of sleep habits. Review good sleep hygiene tips.    Pulmonary Rehab from 04/05/2018 in [facility] Cardiac and Pulmonary Rehab  Date   04/05/18  Educator  [facility]  Instruction Review Code  1- Verbalizes Understanding      Other: -Provides group and verbal instruction on various topics (see comments)    Knowledge Questionnaire Score: Knowledge Questionnaire Score - 03/14/18 1111      Knowledge Questionnaire Score   Pre Score  14/18 reviewed with patient        Core Components/Risk Factors/Patient Goals at Admission: Personal Goals and Risk Factors at Admission - 03/14/18 1149      Core Components/Risk Factors/Patient Goals on Admission    Weight Management  Yes;Weight Maintenance;Weight Loss    Intervention  Weight Management: Develop a combined nutrition and exercise program designed to reach desired caloric intake, while maintaining appropriate intake of nutrient and fiber, sodium and fats, and appropriate energy expenditure required for the weight goal.;Weight Management: Provide education and appropriate  resources to help participant work on and attain dietary goals.;Weight Management/Obesity: Establish reasonable short term and long term weight goals.    Admit Weight  179 lb 1.6 oz (81.2 kg)    Goal Weight: Short Term  174 lb (78.9 kg)    Goal Weight: Long Term  170 lb (77.1 kg)    Expected Outcomes  Short Term: Continue to assess and modify interventions until short term weight is achieved;Long Term: Adherence to nutrition and physical activity/exercise program aimed toward attainment of established weight goal;Weight Maintenance: Understanding of the daily nutrition guidelines, which includes 25-35% calories from fat, 7% or less cal from saturated fats, less than 214m cholesterol, less than 1.5gm of sodium, & 5 or more servings of fruits and vegetables daily;Understanding recommendations for meals to include 15-35% energy as protein, 25-35% energy from fat, 35-60% energy from carbohydrates, less than 2048mof dietary cholesterol, 20-35 gm of total fiber daily;Understanding of distribution of calorie intake throughout  the day with the consumption of 4-5 meals/snacks    Improve shortness of breath with ADL's  Yes    Intervention  Provide education, individualized exercise plan and daily activity instruction to help decrease symptoms of SOB with activities of daily living.    Expected Outcomes  Short Term: Improve cardiorespiratory fitness to achieve a reduction of symptoms when performing ADLs;Long Term: Be able to perform more ADLs without symptoms or delay the onset of symptoms    Hypertension  Yes    Intervention  Provide education on lifestyle modifcations including regular physical activity/exercise, weight management, moderate sodium restriction and increased consumption of fresh fruit, vegetables, and low fat dairy, alcohol moderation, and smoking cessation.;Monitor prescription use compliance.    Expected Outcomes  Short Term: Continued assessment and intervention until BP is < 140/9096mG in hypertensive participants. < 130/12m63m in hypertensive participants with diabetes, heart failure or chronic kidney disease.;Long Term: Maintenance of blood pressure at goal levels.       Core Components/Risk Factors/Patient Goals Review:    Core Components/Risk Factors/Patient Goals at Discharge (Final Review):    ITP Comments: ITP Comments    Row Name 03/14/18 1051 04/10/18 1523         ITP Comments  Medical Evaluation completed. Chart sent for review and changes to Dr. MarkEmily Filbertector of [facility] can be found in [facility] encounter 02/16/18   30 day review completed. ITP sent to Dr. MarkEmily Filbertector of [facility] with ITP unless changes are made by physician         Comments: 30 day review

## 2018-04-10 NOTE — Progress Notes (Signed)
Daily Session Note  Patient Details  Name: Derrick Sheppard MRN: [id number] Date of Birth: 08/08/1956 Referring Provider:    Encounter Date: 04/10/2018  Check In: Session Check In - 04/10/18 1221      Check-In   Location  [facility]-Cardiac & Pulmonary Rehab    Staff Present  Justin Mend RCP,RRT,BSRT;Laureen Owens Shark, BS, RRT, Respiratory Therapist;Nana Addai, RN BSN    Supervising physician immediately available to respond to emergencies  [facility] immediately available ER MD    Physician(s)  Dr. Corky Downs and Jimmye Norman    Medication changes reported      No    Fall or balance concerns reported     No    Warm-up and Cool-down  Performed as group-led instruction    Resistance Training Performed  Yes    VAD Patient?  No      Pain Assessment   Currently in Pain?  No/denies          Social History   Tobacco Use  Smoking Status Former Smoker  . Packs/day: 1.50  . Years: 46.00  . Pack years: 69.00  . Types: Cigarettes  . Last attempt to quit: 09/01/2017  . Years since quitting: 0.6  Smokeless Tobacco Never Used    Goals Met:  Independence with exercise equipment Exercise tolerated well No report of cardiac concerns or symptoms Strength training completed today  Goals Unmet:  Not Applicable  Comments: Pt able to follow exercise prescription today without complaint.  Will continue to monitor for progression.   Dr. Emily Filbert is Medical Director for [place] and [facility].

## 2018-04-12 ENCOUNTER — Encounter: Payer: 59 | Admitting: *Deleted

## 2018-04-12 DIAGNOSIS — J410 Simple chronic bronchitis: Secondary | ICD-10-CM | POA: Diagnosis not present

## 2018-04-12 NOTE — Progress Notes (Signed)
Daily Session Note  Patient Details  Name: Derrick Sheppard MRN: [id number] Date of Birth: 1955-10-16 Referring Provider:    Encounter Date: 04/12/2018  Check In: Session Check In - 04/12/18 1135      Check-In   Location  [facility]-Cardiac & Pulmonary Rehab    Staff Present  Heath Lark, RN, BSN, CCRP;Meredith Sherryll Burger, RN BSN;Jannae Fagerstrom Luan Pulling, MA, RCEP, CCRP, Exercise Physiologist    Supervising physician immediately available to respond to emergencies  [facility] immediately available ER MD    Physician(s)  Drs. Jimmye Norman and [place]    Medication changes reported      No    Fall or balance concerns reported     No    Warm-up and Cool-down  Performed as group-led Higher education careers adviser Performed  Yes    VAD Patient?  No    PAD/SET Patient?  No      Pain Assessment   Currently in Pain?  No/denies        Exercise Prescription Changes - 04/11/18 1500      Response to Exercise   Blood Pressure (Admit)  124/70    Blood Pressure (Exit)  136/80    Heart Rate (Admit)  82 bpm    Heart Rate (Exercise)  113 bpm    Heart Rate (Exit)  89 bpm    Oxygen Saturation (Admit)  95 %    Oxygen Saturation (Exercise)  90 %    Oxygen Saturation (Exit)  93 %    Rating of Perceived Exertion (Exercise)  11    Perceived Dyspnea (Exercise)  3    Symptoms  none    Duration  Continue with 45 min of aerobic exercise without signs/symptoms of physical distress.    Intensity  THRR unchanged      Progression   Progression  Continue to progress workloads to maintain intensity without signs/symptoms of physical distress.    Average METs  2.72      Resistance Training   Training Prescription  Yes    Weight  4 lbs    Reps  10-15      Interval Training   Interval Training  No      Treadmill   MPH  2.2    Grade  0    Minutes  15    METs  2.84      Recumbant Bike   Level  5    RPM  55    Watts  25    Minutes  15    METs  2.98      T5 Nustep   Level  5    SPM  74    Minutes  15     METs  2.5      Home Exercise Plan   Plans to continue exercise at  [employer] (comment) church gym    Frequency  Add 1 additional day to program exercise sessions.    Initial Home Exercises Provided  03/29/18       Social History   Tobacco Use  Smoking Status Former Smoker  . Packs/day: 1.50  . Years: 46.00  . Pack years: 69.00  . Types: Cigarettes  . Last attempt to quit: 09/01/2017  . Years since quitting: 0.6  Smokeless Tobacco Never Used    Goals Met:  Independence with exercise equipment Exercise tolerated well No report of cardiac concerns or symptoms Strength training completed today  Goals Unmet:  Not Applicable  Comments: Pt able to follow exercise  prescription today without complaint.  Will continue to monitor for progression.    Dr. Emily Filbert is Medical Director for [place] and [facility].

## 2018-04-14 ENCOUNTER — Encounter: Payer: 59 | Admitting: *Deleted

## 2018-04-14 DIAGNOSIS — J410 Simple chronic bronchitis: Secondary | ICD-10-CM | POA: Diagnosis not present

## 2018-04-14 NOTE — Progress Notes (Signed)
Daily Session Note  Patient Details  Name: Derrick Sheppard MRN: [id number] Date of Birth: 05-22-1956 Referring Provider:    Encounter Date: 04/14/2018  Check In: Session Check In - 04/14/18 1128      Check-In   Location  [facility]-Cardiac & Pulmonary Rehab    Staff Present  Justin Mend Lorre Nick, [place], RCEP, CCRP, Exercise Physiologist;Danyele Smejkal Sherryll Burger, RN BSN    Supervising physician immediately available to respond to emergencies  [facility] immediately available ER MD    Physician(s)  Dr. Cherylann Banas and Alfred Levins     Medication changes reported      No    Fall or balance concerns reported     No    Tobacco Cessation  No Change    Warm-up and Cool-down  Performed as group-led instruction    Resistance Training Performed  Yes    VAD Patient?  No      Pain Assessment   Currently in Pain?  No/denies          Social History   Tobacco Use  Smoking Status Former Smoker  . Packs/day: 1.50  . Years: 46.00  . Pack years: 69.00  . Types: Cigarettes  . Last attempt to quit: 09/01/2017  . Years since quitting: 0.6  Smokeless Tobacco Never Used    Goals Met:  Proper associated with RPD/PD & O2 Sat Independence with exercise equipment Using PLB without cueing & demonstrates good technique Exercise tolerated well No report of cardiac concerns or symptoms Strength training completed today  Goals Unmet:  Not Applicable  Comments: Pt able to follow exercise prescription today without complaint.  Will continue to monitor for progression.    Dr. Emily Filbert is Medical Director for [place] and [facility].

## 2018-04-17 DIAGNOSIS — J410 Simple chronic bronchitis: Secondary | ICD-10-CM

## 2018-04-17 NOTE — Progress Notes (Signed)
Daily Session Note  Patient Details  Name: Derrick Sheppard MRN: [id number] Date of Birth: May 27, 1956 Referring Provider:    Encounter Date: 04/17/2018  Check In: Session Check In - 04/17/18 1141      Check-In   Location  [facility]-Cardiac & Pulmonary Rehab    Staff Present  Justin Mend RCP,RRT,BSRT;Laureen Owens Shark, BS, RRT, Respiratory Bertis Ruddy, BS, ACSM CEP, Exercise Physiologist    Supervising physician immediately available to respond to emergencies  [facility] immediately available ER MD    Physician(s)  Dr. Wynona Neat and Cinda Quest    Medication changes reported      No    Fall or balance concerns reported     No    Tobacco Cessation  No Change    Warm-up and Cool-down  Performed as group-led instruction    Resistance Training Performed  No    VAD Patient?  No    PAD/SET Patient?  No      Pain Assessment   Currently in Pain?  No/denies    Multiple Pain Sites  No          Social History   Tobacco Use  Smoking Status Former Smoker  . Packs/day: 1.50  . Years: 46.00  . Pack years: 69.00  . Types: Cigarettes  . Last attempt to quit: 09/01/2017  . Years since quitting: 0.6  Smokeless Tobacco Never Used    Goals Met:  Proper associated with RPD/PD & O2 Sat Independence with exercise equipment Using PLB without cueing & demonstrates good technique Exercise tolerated well No report of cardiac concerns or symptoms Strength training completed today  Goals Unmet:  Not Applicable  Comments: Pt able to follow exercise prescription today without complaint.  Will continue to monitor for progression.    Dr. Emily Filbert is Medical Director for [place] and [facility].

## 2018-04-19 DIAGNOSIS — J410 Simple chronic bronchitis: Secondary | ICD-10-CM

## 2018-04-19 NOTE — Progress Notes (Signed)
Daily Session Note  Patient Details  Name: Derrick Sheppard MRN: [id number] Date of Birth: Feb 16, 1956 Referring Provider:    Encounter Date: 04/19/2018  Check In: Session Check In - 04/19/18 1133      Check-In   Location  [facility]-Cardiac & Pulmonary Rehab    Staff Present  Justin Mend RCP,RRT,BSRT;Nana Addai, RN BSN;Meredith Sherryll Burger, RN BSN    Supervising physician immediately available to respond to emergencies  [facility] immediately available ER MD    Physician(s)  Dr. Archie Balboa and Joni Fears    Medication changes reported      No    Fall or balance concerns reported     No    Warm-up and Cool-down  Performed as group-led instruction    Resistance Training Performed  Yes    VAD Patient?  No      Pain Assessment   Currently in Pain?  No/denies          Social History   Tobacco Use  Smoking Status Former Smoker  . Packs/day: 1.50  . Years: 46.00  . Pack years: 69.00  . Types: Cigarettes  . Last attempt to quit: 09/01/2017  . Years since quitting: 0.6  Smokeless Tobacco Never Used    Goals Met:  Independence with exercise equipment Exercise tolerated well No report of cardiac concerns or symptoms Strength training completed today  Goals Unmet:  Not Applicable  Comments: Pt able to follow exercise prescription today without complaint.  Will continue to monitor for progression.   Dr. Emily Filbert is Medical Director for [place] and [facility].

## 2018-04-21 DIAGNOSIS — J410 Simple chronic bronchitis: Secondary | ICD-10-CM | POA: Diagnosis not present

## 2018-04-21 NOTE — Progress Notes (Signed)
Daily Session Note  Patient Details  Name: Derrick Sheppard MRN: [id number] Date of Birth: 1956-09-02 Referring Provider:    Encounter Date: 04/21/2018  Check In: Session Check In - 04/21/18 1143      Check-In   Location  [facility]-Cardiac & Pulmonary Rehab    Staff Present  Justin Mend RCP,RRT,BSRT;Meredith Sherryll Burger, RN Vickki Hearing, BA, ACSM CEP, Exercise Physiologist    Supervising physician immediately available to respond to emergencies  [facility] immediately available ER MD    Physician(s)  Dr. Joni Fears and Corky Downs    Medication changes reported      No    Fall or balance concerns reported     No    Tobacco Cessation  No Change    Warm-up and Cool-down  Performed as group-led instruction    Resistance Training Performed  Yes    VAD Patient?  No      Pain Assessment   Currently in Pain?  No/denies          Social History   Tobacco Use  Smoking Status Former Smoker  . Packs/day: 1.50  . Years: 46.00  . Pack years: 69.00  . Types: Cigarettes  . Last attempt to quit: 09/01/2017  . Years since quitting: 0.6  Smokeless Tobacco Never Used    Goals Met:  Proper associated with RPD/PD & O2 Sat Independence with exercise equipment Using PLB without cueing & demonstrates good technique Exercise tolerated well No report of cardiac concerns or symptoms Strength training completed today  Goals Unmet:  Not Applicable  Comments: Pt able to follow exercise prescription today without complaint.  Will continue to monitor for progression.    Dr. Emily Filbert is Medical Director for [place] and [facility].

## 2018-04-24 VITALS — Ht 66.5 in | Wt 180.0 lb

## 2018-04-24 DIAGNOSIS — J410 Simple chronic bronchitis: Secondary | ICD-10-CM

## 2018-04-24 NOTE — Progress Notes (Signed)
Daily Session Note  Patient Details  Name: Derrick Sheppard MRN: [id number] Date of Birth: 30-Dec-1955 Referring Provider:    Encounter Date: 04/24/2018  Check In: Session Check In - 04/24/18 1134      Check-In   Location  [facility]-Cardiac & Pulmonary Rehab    Staff Present  Justin Mend RCP,RRT,BSRT;Amanda Oletta Darter, BA, ACSM CEP, Exercise Physiologist;Kelly Amedeo Plenty, BS, ACSM CEP, Exercise Physiologist    Supervising physician immediately available to respond to emergencies  [facility] immediately available ER MD    Physician(s)  Dr. Jimmye Norman and Corky Downs    Medication changes reported      No    Fall or balance concerns reported     No    Warm-up and Cool-down  Performed as group-led instruction    Resistance Training Performed  Yes    VAD Patient?  No      Pain Assessment   Currently in Pain?  No/denies          Social History   Tobacco Use  Smoking Status Former Smoker  . Packs/day: 1.50  . Years: 46.00  . Pack years: 69.00  . Types: Cigarettes  . Last attempt to quit: 09/01/2017  . Years since quitting: 0.6  Smokeless Tobacco Never Used    Goals Met:  Independence with exercise equipment Exercise tolerated well No report of cardiac concerns or symptoms Strength training completed today  Goals Unmet:  Not Applicable  Comments: Pt able to follow exercise prescription today without complaint.  Will continue to monitor for progression.  [place] Name 03/14/18 1320 04/24/18 1245       6 Minute Walk   Phase  -  Discharge    Distance  1214 feet  1230 feet    Distance % Change  -  1.3 %    Distance Feet Change  -  16 ft    Walk Time  6 minutes  6 minutes    # of Rest Breaks  0  0    MPH  2.3  2.33    METS  3.15  3.15    RPE  11  11    Perceived Dyspnea   3  1    VO2 Peak  11.03  11.01    Symptoms  No  No    Resting HR  59 bpm  73 bpm    Resting BP  150/82  130/70    Resting Oxygen Saturation   96 %  95 %    Exercise Oxygen Saturation  during 6 min  walk  91 %  92 %    Max Ex. HR  86 bpm  98 bpm    Max Ex. BP  166/84  142/80    2 Minute Post BP  152/84  138/80      Interval HR   1 Minute HR  71  97    2 Minute HR  84  98    3 Minute HR  -  98    4 Minute HR  86  90    5 Minute HR  84  87    6 Minute HR  86  94    2 Minute Post HR  65  64    Interval Heart Rate?  Yes  Yes      Interval Oxygen   Interval Oxygen?  Yes  Yes    Baseline Oxygen Saturation %  96 %  95 %    1  Minute Oxygen Saturation %  93 %  95 %    1 Minute Liters of Oxygen  0 L  0 L    2 Minute Oxygen Saturation %  91 %  94 %    2 Minute Liters of Oxygen  0 L  0 L    3 Minute Oxygen Saturation %  92 %  93 %    3 Minute Liters of Oxygen  0 L  0 L    4 Minute Oxygen Saturation %  92 %  94 %    4 Minute Liters of Oxygen  0 L  0 L    5 Minute Oxygen Saturation %  92 %  93 %    5 Minute Liters of Oxygen  0 L  93 L    6 Minute Oxygen Saturation %  93 %  92 %    6 Minute Liters of Oxygen  0 L  0 L    2 Minute Post Oxygen Saturation %  97 %  98 %    2 Minute Post Liters of Oxygen  0 L  0 L         Dr. Emily Filbert is Medical Director for [place] and [facility].

## 2018-04-26 ENCOUNTER — Encounter: Payer: 59 | Admitting: *Deleted

## 2018-04-26 DIAGNOSIS — J410 Simple chronic bronchitis: Secondary | ICD-10-CM

## 2018-04-26 NOTE — Progress Notes (Signed)
Daily Session Note  Patient Details  Name: Derrick Sheppard MRN: [id number] Date of Birth: [birth year] Referring Provider:    Encounter Date: 04/26/2018  Check In: Session Check In - 04/26/18 1132      Check-In   Location  [facility]-Cardiac [facility] Pulmonary Rehab    Staff Present  Justin Mend Lorre Nick, [place], RCEP, CCRP, Exercise Physiologist;Olga Seyler Sherryll Burger, RN BSN    Supervising physician immediately available to respond to emergencies  [facility] immediately available ER MD    Physician(s)  Dr. Jacqualine Code and Jimmye Norman     Medication changes reported      No    Fall or balance concerns reported     No    Tobacco Cessation  No Change    Warm-up and Cool-down  Performed as group-led instruction    Resistance Training Performed  Yes    VAD Patient?  No      Pain Assessment   Currently in Pain?  No/denies        Exercise Prescription Changes - 04/25/18 1400      Response to Exercise   Blood Pressure (Admit)  130/70    Blood Pressure (Exit)  138/80    Heart Rate (Admit)  73 bpm    Heart Rate (Exercise)  96 bpm    Heart Rate (Exit)  78 bpm    Oxygen Saturation (Admit)  95 %    Oxygen Saturation (Exercise)  93 %    Oxygen Saturation (Exit)  94 %    Rating of Perceived Exertion (Exercise)  11    Perceived Dyspnea (Exercise)  1    Symptoms  none    Duration  Continue with 45 min of aerobic exercise without signs/symptoms of physical distress.    Intensity  THRR unchanged      Progression   Progression  Continue to progress workloads to maintain intensity without signs/symptoms of physical distress.    Average METs  3      Resistance Training   Training Prescription  Yes    Weight  5 lbs    Reps  10-15      Interval Training   Interval Training  No      Treadmill   MPH  2.2    Grade  0.5    Minutes  15    METs  2.84      Recumbant Bike   Level  6    Watts  41    Minutes  15    METs  3.57      T5 Nustep   Level  5    Minutes  15    METs  2.6      Home  Exercise Plan   Plans to continue exercise at  [employer] (comment) church gym    Frequency  Add 1 additional day to program exercise sessions.    Initial Home Exercises Provided  03/29/18       Social History   Tobacco Use  Smoking Status Former Smoker  . Packs/day: 1.50  . Years: 46.00  . Pack years: 69.00  . Types: Cigarettes  . Last attempt to quit: 09/01/2017  . Years since quitting: 0.6  Smokeless Tobacco Never Used    Goals Met:  Proper associated with RPD/PD & O2 Sat Independence with exercise equipment Using PLB without cueing & demonstrates good technique Exercise tolerated well No report of cardiac concerns or symptoms Strength training completed today  Goals Unmet:  Not Applicable  Comments: Pt able  to follow exercise prescription today without complaint.  Will continue to monitor for progression.    Dr. Emily Filbert is Medical Director for [place] and [facility].

## 2018-04-28 ENCOUNTER — Encounter: Payer: 59 | Admitting: *Deleted

## 2018-04-28 DIAGNOSIS — J410 Simple chronic bronchitis: Secondary | ICD-10-CM | POA: Diagnosis not present

## 2018-04-28 NOTE — Progress Notes (Signed)
Daily Session Note  Patient Details  Name: SAYID MOLL MRN: [id number] Date of Birth: 1956/06/23 Referring Provider:    Encounter Date: 04/28/2018  Check In: Session Check In - 04/28/18 1138      Check-In   Location  [facility]    Staff Present  Justin Mend Lorre Nick, [place], RCEP, CCRP, Exercise Physiologist;Abdulloh Ullom Sherryll Burger, RN BSN    Supervising physician immediately available to respond to emergencies  [facility] immediately available ER MD    Physician(s)  Dr. Mariea Clonts and Burlene Arnt    Medication changes reported      No    Fall or balance concerns reported     No    Tobacco Cessation  No Change    Warm-up and Cool-down  Performed as group-led instruction    Resistance Training Performed  Yes    VAD Patient?  No      Pain Assessment   Currently in Pain?  No/denies          Social History   Tobacco Use  Smoking Status Former Smoker  . Packs/day: 1.50  . Years: 46.00  . Pack years: 69.00  . Types: Cigarettes  . Last attempt to quit: 09/01/2017  . Years since quitting: 0.6  Smokeless Tobacco Never Used    Goals Met:  Proper associated with RPD/PD & O2 Sat Independence with exercise equipment Using PLB without cueing & demonstrates good technique Exercise tolerated well No report of cardiac concerns or symptoms Strength training completed today  Goals Unmet:  Not Applicable  Comments: Pt able to follow exercise prescription today without complaint.  Will continue to monitor for progression.    Dr. Emily Filbert is Medical Director for [place] and [facility].

## 2018-05-01 DIAGNOSIS — J410 Simple chronic bronchitis: Secondary | ICD-10-CM

## 2018-05-01 NOTE — Progress Notes (Signed)
Daily Session Note  Patient Details  Name: Derrick Sheppard MRN: [id number] Date of Birth: 06-01-1956 Referring Provider:    Encounter Date: 05/01/2018  Check In: Session Check In - 05/01/18 1142      Check-In   Location  [facility]    Staff Present  Justin Mend Jaci Carrel, BS, ACSM CEP, Exercise Physiologist;Dayyan Krist Oletta Darter, [place], ACSM CEP, Exercise Physiologist    Supervising physician immediately available to respond to emergencies  [facility] immediately available ER MD    Physician(s)  Cinda Quest and Archie Balboa    Medication changes reported      No    Fall or balance concerns reported     No    Warm-up and Cool-down  Performed as group-led instruction    Resistance Training Performed  Yes    VAD Patient?  No    PAD/SET Patient?  No      Pain Assessment   Currently in Pain?  No/denies    Multiple Pain Sites  No          Social History   Tobacco Use  Smoking Status Former Smoker  . Packs/day: 1.50  . Years: 46.00  . Pack years: 69.00  . Types: Cigarettes  . Last attempt to quit: 09/01/2017  . Years since quitting: 0.6  Smokeless Tobacco Never Used    Goals Met:  Proper associated with RPD/PD & O2 Sat Independence with exercise equipment Exercise tolerated well Strength training completed today  Goals Unmet:  Not Applicable  Comments: Pt able to follow exercise prescription today without complaint.  Will continue to monitor for progression.    Dr. Emily Filbert is Medical Director for [place] and [facility].

## 2018-05-03 ENCOUNTER — Encounter: Payer: 59 | Admitting: *Deleted

## 2018-05-03 DIAGNOSIS — J410 Simple chronic bronchitis: Secondary | ICD-10-CM

## 2018-05-03 NOTE — Progress Notes (Signed)
Pulmonary Individual Treatment Plan  Patient Details  Name: Derrick Sheppard MRN: [id number] Date of Birth: 1956/09/07 Referring Provider:    Initial Encounter Date:    Pulmonary Rehab from 03/14/2018 in [facility]  Date  03/14/18      Visit Diagnosis: Simple chronic bronchitis ([place])  Patient's Home Medications on Admission:  Current Outpatient Medications:  .  albuterol (PROVENTIL HFA;VENTOLIN HFA) 108 (90 Base) MCG/ACT inhaler, Inhale 2 puffs into the lungs every 6 (six) hours as needed for wheezing or shortness of breath., Disp: , Rfl:  .  aspirin EC 81 MG tablet, Take 81 mg by mouth daily., Disp: , Rfl:  .  B Complex-C (B-COMPLEX WITH VITAMIN C) tablet, Take 1 tablet by mouth daily., Disp: , Rfl:  .  Cholecalciferol 1000 units TBDP, Take 1,000 Units/day by mouth daily., Disp: , Rfl:  .  esomeprazole (NEXIUM) 40 MG capsule, Take 40 mg by mouth daily at 12 noon., Disp: , Rfl:  .  Fluticasone-Umeclidin-Vilant (TRELEGY ELLIPTA) 100-62.5-25 MCG/INH AEPB, Inhale into the lungs., Disp: , Rfl:  .  ibuprofen (ADVIL,MOTRIN) 200 MG tablet, Take 600 mg by mouth every 4 (four) hours as needed. , Disp: , Rfl:  .  losartan (COZAAR) 50 MG tablet, Take 50 mg by mouth daily., Disp: , Rfl:   Past Medical History: Past Medical History:  Diagnosis Date  . Allergic state   . Arthritis   . Asthma   . B12 deficiency   . COPD (chronic obstructive pulmonary disease) ([place])   . Coronary artery disease   . Dysrhythmia   . Edema   . GERD (gastroesophageal reflux disease)   . Headache   . Palpitations   . Pulmonary nodules     Tobacco Use: Social History   Tobacco Use  Smoking Status Former Smoker  . Packs/day: 1.50  . Years: 46.00  . Pack years: 69.00  . Types: Cigarettes  . Last attempt to quit: 09/01/2017  . Years since quitting: 0.6  Smokeless Tobacco Never Used    Labs: Recent Review Flowsheet Data    Labs for ITP Cardiac and Pulmonary Rehab Latest Ref Rng &  Units 09/07/2017 09/08/2017 09/10/2017 09/13/2017 09/16/2017   Trlycerides <150 mg/dL - - 73 72 -   PHART 7.350 - 7.450 7.479(H) 7.466(H) - - 7.453(H)   PCO2ART 32.0 - 48.0 mmHg 43.1 42.4 - - 32.2   HCO3 20.0 - 28.0 mmol/L 31.3(H) 30.2(H) - - 22.3   TCO2 22 - 32 mmol/L - - - - -   ACIDBASEDEF 0.0 - 2.0 mmol/L - - - - 1.2   O2SAT % 92.4 95.6 - - 92.4       Pulmonary Assessment Scores: Pulmonary Assessment Scores    Row Name 03/14/18 1110 04/21/18 1225 04/24/18 1250     ADL UCSD   ADL Phase  Entry  Exit  Exit   SOB Score total  48  9  -   Rest  1  1  -   Walk  1  0  -   Stairs  3  1  -   Bath  3  1  -   Dress  1  0  -   Shop  2  0  -     CAT Score   CAT Score  15  6  -     mMRC Score   mMRC Score  1  -  1      Pulmonary Function Assessment: Pulmonary  Function Assessment - 03/14/18 1135      Breath   Bilateral Breath Sounds  Clear    Shortness of Breath  Yes;Limiting activity       Exercise Target Goals:    Exercise Program Goal: Individual exercise prescription set using results from initial 6 min walk test and THRR while considering  patient's activity barriers and safety.    Exercise Prescription Goal: Initial exercise prescription builds to 30-45 minutes a day of aerobic activity, 2-3 days per week.  Home exercise guidelines will be given to patient during program as part of exercise prescription that the participant will acknowledge.  Activity Barriers & Risk Stratification:   6 Minute Walk: 6 Minute Walk    Row Name 03/14/18 1320 04/24/18 1245 04/28/18 1150     6 Minute Walk   Phase  -  Discharge  Discharge redo before weights as it would fatigue him   Distance  1214 feet  1230 feet  1488 feet   Distance % Change  -  1.3 %  18.4 %   Distance Feet Change  -  16 ft  274 ft   Walk Time  6 minutes  6 minutes  6 minutes   # of Rest Breaks  0  0  0   MPH  2.3  2.33  2.81   METS  3.15  3.15  3.61   RPE  _0 Perceived Dyspnea   _1 VO2  Peak  11.03  11.01  12.63   Symptoms  No  No  Yes (comment)   Comments  -  -  SOB at end   Resting HR  59 bpm  73 bpm  71 bpm   Resting BP  150/82  130/70  126/74   Resting Oxygen Saturation   96 %  95 %  92 %   Exercise Oxygen Saturation  during 6 min walk  91 %  92 %  89 %   Max Ex. HR  86 bpm  98 bpm  103 bpm   Max Ex. BP  166/84  142/80  136/64   2 Minute Post BP  152/84  138/80  134/60     Interval HR   1 Minute HR  71  97  -   2 Minute HR  84  98  -   3 Minute HR  -  98  -   4 Minute HR  86  90  -   5 Minute HR  84  87  -   6 Minute HR  86  94  -   2 Minute Post HR  65  64  -   Interval Heart Rate?  Yes  Yes  -     Interval Oxygen   Interval Oxygen?  Yes  Yes  Yes   Baseline Oxygen Saturation %  96 %  95 %  92 %   1 Minute Oxygen Saturation %  93 %  95 %  94 %   1 Minute Liters of Oxygen  0 L  0 L  0 L Room Air   2 Minute Oxygen Saturation %  91 %  94 %  92 %   2 Minute Liters of Oxygen  0 L  0 L  0 L   3 Minute Oxygen Saturation %  92 %  93 %  91 %   3 Minute Liters of Oxygen  0  L  0 L  0 L   4 Minute Oxygen Saturation %  92 %  94 %  91 %   4 Minute Liters of Oxygen  0 L  0 L  0 L   5 Minute Oxygen Saturation %  92 %  93 %  90 %   5 Minute Liters of Oxygen  0 L  93 L  0 L   6 Minute Oxygen Saturation %  93 %  92 %  89 %   6 Minute Liters of Oxygen  0 L  0 L  0 L   2 Minute Post Oxygen Saturation %  97 %  98 %  94 %   2 Minute Post Liters of Oxygen  0 L  0 L  0 L     Oxygen Initial Assessment: Oxygen Initial Assessment - 03/14/18 1148      Home Oxygen   Home Oxygen Device  None    Sleep Oxygen Prescription  None    Home Exercise Oxygen Prescription  None    Home at Rest Exercise Oxygen Prescription  None      Initial 6 min Walk   Oxygen Used  None      Program Oxygen Prescription   Program Oxygen Prescription  None      Intervention   Short Term Goals  To learn and demonstrate proper use of respiratory medications;To learn and understand importance of  maintaining oxygen saturations>88%;To learn and demonstrate proper pursed lip breathing techniques or other breathing techniques.;To learn and understand importance of monitoring SPO2 with pulse oximeter and demonstrate accurate use of the pulse oximeter.    Long  Term Goals  Verbalizes importance of monitoring SPO2 with pulse oximeter and return demonstration;Maintenance of O2 saturations>88%;Exhibits proper breathing techniques, such as pursed lip breathing or other method taught during program session;Compliance with respiratory medication;Demonstrates proper use of MDI's       Oxygen Re-Evaluation: Oxygen Re-Evaluation    Row Name 03/22/18 1140             Goals/Expected Outcomes   Short Term Goals  To learn and demonstrate proper use of respiratory medications;To learn and understand importance of maintaining oxygen saturations>88%;To learn and demonstrate proper pursed lip breathing techniques or other breathing techniques.;To learn and understand importance of monitoring SPO2 with pulse oximeter and demonstrate accurate use of the pulse oximeter.       Long  Term Goals  Verbalizes importance of monitoring SPO2 with pulse oximeter and return demonstration;Maintenance of O2 saturations>88%;Exhibits proper breathing techniques, such as pursed lip breathing or other method taught during program session;Compliance with respiratory medication;Demonstrates proper use of MDI's       Comments  Reviewed PLB technique with pt.  Talked about how it work and it's important to maintaining his exercise saturations.         Goals/Expected Outcomes  Short: Become more profiecient at using PLB.   Long: Become independent at using PLB.          Oxygen Discharge (Final Oxygen Re-Evaluation): Oxygen Re-Evaluation - 03/22/18 1140      Goals/Expected Outcomes   Short Term Goals  To learn and demonstrate proper use of respiratory medications;To learn and understand importance of maintaining oxygen  saturations>88%;To learn and demonstrate proper pursed lip breathing techniques or other breathing techniques.;To learn and understand importance of monitoring SPO2 with pulse oximeter and demonstrate accurate use of the pulse oximeter.    Long  Term Goals  Verbalizes importance  of monitoring SPO2 with pulse oximeter and return demonstration;Maintenance of O2 saturations>88%;Exhibits proper breathing techniques, such as pursed lip breathing or other method taught during program session;Compliance with respiratory medication;Demonstrates proper use of MDI's    Comments  Reviewed PLB technique with pt.  Talked about how it work and it's important to maintaining his exercise saturations.      Goals/Expected Outcomes  Short: Become more profiecient at using PLB.   Long: Become independent at using PLB.       Initial Exercise Prescription: Initial Exercise Prescription - 03/14/18 1300      Date of Initial Exercise RX and Referring Provider   Date  03/14/18      Treadmill   MPH  2.3    Grade  0.5    Minutes  15    METs  2.92      Recumbant Bike   Level  3    RPM  60    Watts  23    Minutes  15    METs  2.9      T5 Nustep   Level  2    SPM  80    Minutes  15    METs  2.9      Prescription Details   Frequency (times per week)  3    Duration  Progress to 45 minutes of aerobic exercise without signs/symptoms of physical distress      Intensity   THRR 40-80% of Max Heartrate  98-137    Ratings of Perceived Exertion  11-15    Perceived Dyspnea  0-4      Resistance Training   Training Prescription  Yes    Weight  4 lb    Reps  10-15       Perform Capillary Blood Glucose checks as needed.  Exercise Prescription Changes: Exercise Prescription Changes    Row Name 03/28/18 1400 03/29/18 1200 04/11/18 1500 04/25/18 1400       Response to Exercise   Blood Pressure (Admit)  144/80  -  124/70  130/70    Blood Pressure (Exercise)  178/82  -  -  -    Blood Pressure (Exit)  140/80   -  136/80  138/80    Heart Rate (Admit)  74 bpm  -  82 bpm  73 bpm    Heart Rate (Exercise)  112 bpm  -  113 bpm  96 bpm    Heart Rate (Exit)  91 bpm  -  89 bpm  78 bpm    Oxygen Saturation (Admit)  94 %  -  95 %  95 %    Oxygen Saturation (Exercise)  91 %  -  90 %  93 %    Oxygen Saturation (Exit)  93 %  -  93 %  94 %    Rating of Perceived Exertion (Exercise)  15  -  11  11    Perceived Dyspnea (Exercise)  3  -  3  1    Symptoms  none  -  none  none    Comments  third full day of exercise  -  -  -    Duration  Progress to 45 minutes of aerobic exercise without signs/symptoms of physical distress  -  Continue with 45 min of aerobic exercise without signs/symptoms of physical distress.  Continue with 45 min of aerobic exercise without signs/symptoms of physical distress.    Intensity  THRR unchanged  -  THRR unchanged  THRR unchanged      Progression   Progression  Continue to progress workloads to maintain intensity without signs/symptoms of physical distress.  -  Continue to progress workloads to maintain intensity without signs/symptoms of physical distress.  Continue to progress workloads to maintain intensity without signs/symptoms of physical distress.    Average METs  2.57  -  2.72  3      Resistance Training   Training Prescription  Yes  -  Yes  Yes    Weight  4 lbs  -  4 lbs  5 lbs    Reps  10-15  -  10-15  10-15      Interval Training   Interval Training  No  -  No  No      Treadmill   MPH  2.3  -  2.2  2.2    Grade  0.5  -  0  0.5    Minutes  15  -  15  15    METs  2.92  -  2.84  2.84      Recumbant Bike   Level  3  -  5  6    RPM  61  -  55  -    Watts  23  -  25  41    Minutes  15  -  15  15    METs  2.98  -  2.98  3.57      T5 Nustep   Level  2  -  5  5    SPM  75  -  74  -    Minutes  15  -  15  15    METs  1.8  -  2.5  2.6      Home Exercise Plan   Plans to continue exercise at  -  [employer] (comment) [place] (comment)  church gym  [employer] (comment) church gym    Frequency  -  Add 1 additional day to program exercise sessions.  Add 1 additional day to program exercise sessions.  Add 1 additional day to program exercise sessions.    Initial Home Exercises Provided  -  03/29/18  03/29/18  03/29/18       Exercise Comments: Exercise Comments    Row Name 03/22/18 1139 05/03/18 1152         Exercise Comments  First full day of exercise!  Patient was oriented to gym and equipment including functions, settings, policies, and procedures.  Patient's individual exercise prescription and treatment plan were reviewed.  All starting workloads were established based on the results of the 6 minute walk test done at initial orientation visit.  The plan for exercise progression was also introduced and progression will be customized based on patient's performance and goals  Ardian graduated today from  rehab with 20 sessions completed.  Details of the patient's exercise prescription and what He needs to do in order to continue the prescription and progress were discussed with patient.  Patient was given a copy of prescription and goals.  Patient verbalized understanding.  Darcey plans to continue to exercise by walking at the park.         Exercise Goals and Review: Exercise Goals    Row Name 03/14/18 1320             Exercise Goals   Increase Physical Activity  Yes       Intervention  Provide  advice, education, support and counseling about physical activity/exercise needs.;Develop an individualized exercise prescription for aerobic and resistive training based on initial evaluation findings, risk stratification, comorbidities and participant's personal goals.       Expected Outcomes  Short Term: Attend rehab on a regular basis to increase amount of physical activity.;Long Term: Add in home exercise to make exercise part of routine and to increase amount of physical activity.;Long Term: Exercising regularly at  least 3-5 days a week.       Increase Strength and Stamina  Yes       Intervention  Provide advice, education, support and counseling about physical activity/exercise needs.;Develop an individualized exercise prescription for aerobic and resistive training based on initial evaluation findings, risk stratification, comorbidities and participant's personal goals.       Expected Outcomes  Short Term: Increase workloads from initial exercise prescription for resistance, speed, and METs.;Short Term: Perform resistance training exercises routinely during rehab and add in resistance training at home;Long Term: Improve cardiorespiratory fitness, muscular endurance and strength as measured by increased METs and functional capacity (6MWT)       Able to understand and use rate of perceived exertion (RPE) scale  Yes       Intervention  Provide education and explanation on how to use RPE scale       Expected Outcomes  Short Term: Able to use RPE daily in rehab to express subjective intensity level;Long Term:  Able to use RPE to guide intensity level when exercising independently       Able to understand and use Dyspnea scale  Yes       Intervention  Provide education and explanation on how to use Dyspnea scale       Expected Outcomes  Short Term: Able to use Dyspnea scale daily in rehab to express subjective sense of shortness of breath during exertion;Long Term: Able to use Dyspnea scale to guide intensity level when exercising independently       Knowledge and understanding of Target Heart Rate Range (THRR)  Yes       Intervention  Provide education and explanation of THRR including how the numbers were predicted and where they are located for reference       Expected Outcomes  Short Term: Able to state/look up THRR;Short Term: Able to use daily as guideline for intensity in rehab;Long Term: Able to use THRR to govern intensity when exercising independently       Able to check pulse independently  Yes        Intervention  Provide education and demonstration on how to check pulse in carotid and radial arteries.;Review the importance of being able to check your own pulse for safety during independent exercise       Expected Outcomes  Short Term: Able to explain why pulse checking is important during independent exercise;Long Term: Able to check pulse independently and accurately       Understanding of Exercise Prescription  Yes       Intervention  Provide education, explanation, and written materials on patient's individual exercise prescription       Expected Outcomes  Short Term: Able to explain program exercise prescription;Long Term: Able to explain home exercise prescription to exercise independently          Exercise Goals Re-Evaluation : Exercise Goals Re-Evaluation    Row Name 03/22/18 1139 03/28/18 1410 03/29/18 1249 04/11/18 1532 04/25/18 1440     Exercise Goal Re-Evaluation   Exercise Goals Review  Able  to understand and use rate of perceived exertion (RPE) scale;Able to understand and use Dyspnea scale;Knowledge and understanding of Target Heart Rate Range (THRR);Understanding of Exercise Prescription  Increase Physical Activity;Increase Strength and Stamina;Understanding of Exercise Prescription  Increase Physical Activity;Able to understand and use rate of perceived exertion (RPE) scale;Knowledge and understanding of Target Heart Rate Range (THRR);Understanding of Exercise Prescription;Increase Strength and Stamina;Able to understand and use Dyspnea scale;Able to check pulse independently  Increase Physical Activity;Increase Strength and Stamina;Understanding of Exercise Prescription  Increase Physical Activity;Increase Strength and Stamina;Understanding of Exercise Prescription   Comments  Reviewed RPE scale, THR and program prescription with pt today.  Pt voiced understanding and was given a copy of goals to take home.   Theodore is off to a good start in rehab. He is already up to 2.3 mph  consistently for 15 min each day.  We will continue to work on his progress.   Reviewed home exercise with pt today.  Pt plans to walk and go to gym at church for exercise.  Reviewed THR, pulse, RPE, sign and symptoms, and when to call 911 or MD.  Also discussed weather considerations and indoor options.  Pt voiced understanding.  Dejon has continued to do well in rehab.  He is now up to level 6 on the recumbent bike.  We will continue to monitor his progression.   Bryndon has been doing well in rehab.  He is already nearing graduation as he will finsih on visit 2.  He only improved his post 6MWT by 67f.  We will try to have him redo his test on a good day. We will continue to monitor his progress.    Expected Outcomes  Short: Use RPE daily to regulate intensity.  Long: Follow program prescription in THR.  Short: Start to increase workloads.  Long: Continue to attend rehab regularly.   Short: Add in at least one extra day a week next week.  Long: Continue to exercise more at home.   Short: Add incline to treadmill. Long: Continue to increase exercise at home.   Short: Redo post 6MWT and graduate!!  Long: Continue to exercise independently.      Discharge Exercise Prescription (Final Exercise Prescription Changes): Exercise Prescription Changes - 04/25/18 1400      Response to Exercise   Blood Pressure (Admit)  130/70    Blood Pressure (Exit)  138/80    Heart Rate (Admit)  73 bpm    Heart Rate (Exercise)  96 bpm    Heart Rate (Exit)  78 bpm    Oxygen Saturation (Admit)  95 %    Oxygen Saturation (Exercise)  93 %    Oxygen Saturation (Exit)  94 %    Rating of Perceived Exertion (Exercise)  11    Perceived Dyspnea (Exercise)  1    Symptoms  none    Duration  Continue with 45 min of aerobic exercise without signs/symptoms of physical distress.    Intensity  THRR unchanged      Progression   Progression  Continue to progress workloads to maintain intensity without signs/symptoms of physical  distress.    Average METs  3      Resistance Training   Training Prescription  Yes    Weight  5 lbs    Reps  10-15      Interval Training   Interval Training  No      Treadmill   MPH  2.2    Grade  0.5  Minutes  15    METs  2.84      Recumbant Bike   Level  6    Watts  41    Minutes  15    METs  3.57      T5 Nustep   Level  5    Minutes  15    METs  2.6      Home Exercise Plan   Plans to continue exercise at  [employer] (comment) church gym    Frequency  Add 1 additional day to program exercise sessions.    Initial Home Exercises Provided  03/29/18       Nutrition:  Target Goals: Understanding of nutrition guidelines, daily intake of sodium <1559m, cholesterol <2059m calories 30% from fat and 7% or less from saturated fats, daily to have 5 or more servings of fruits and vegetables.  Biometrics: Pre Biometrics - 03/14/18 1319      Pre Biometrics   Height  5' 6.5" (1.689 m)    Weight  179 lb 1.6 oz (81.2 kg)    Waist Circumference  41 inches    Hip Circumference  39.5 inches    Waist to Hip Ratio  1.04 %    BMI (Calculated)  28.48      Post Biometrics - 04/24/18 1250       Post  Biometrics   Height  5' 6.5" (1.689 m)    Weight  180 lb (81.6 kg)    Waist Circumference  40 inches    Hip Circumference  39 inches    Waist to Hip Ratio  1.03 %    BMI (Calculated)  28.62       Nutrition Therapy Plan and Nutrition Goals: Nutrition Therapy & Goals - 03/14/18 1131      Personal Nutrition Goals   Comments  Lose some weight. Meet with the diWheelereducate and counsel regarding individualized specific dietary modifications aiming towards targeted core components such as weight, hypertension, lipid management, diabetes, heart failure and other comorbidities.;Nutrition handout(s) given to patient.    Expected Outcomes  Short Term Goal: A plan has been developed with personal nutrition goals set during  dietitian appointment.;Short Term Goal: Understand basic principles of dietary content, such as calories, fat, sodium, cholesterol and nutrients.;Long Term Goal: Adherence to prescribed nutrition plan.       Nutrition Assessments: Nutrition Assessments - 04/21/18 1229      MEDFICTS Scores   Pre Score  19    Post Score  50    Score Difference  31       Nutrition Goals Re-Evaluation:   Nutrition Goals Discharge (Final Nutrition Goals Re-Evaluation):   Psychosocial: Target Goals: Acknowledge presence or absence of significant depression and/or stress, maximize coping skills, provide positive support system. Participant is able to verbalize types and ability to use techniques and skills needed for reducing stress and depression.   Initial Review & Psychosocial Screening: Initial Psych Review & Screening - 03/14/18 1128      Initial Review   Current issues with  Current Anxiety/Panic;Current Stress Concerns    Source of Stress Concerns  Chronic Illness      Family Dynamics   Good Support System?  Yes    Comments  His wife and GrRozanna Boeraughter are a good support system      Barriers   Psychosocial barriers to participate in program  The patient should benefit from training in  stress management and relaxation.      Screening Interventions   Interventions  Encouraged to exercise;Provide feedback about the scores to participant;Program counselor consult;To provide support and resources with identified psychosocial needs    Expected Outcomes  Short Term goal: Utilizing psychosocial counselor, staff and physician to assist with identification of specific Stressors or current issues interfering with healing process. Setting desired goal for each stressor or current issue identified.;Long Term Goal: Stressors or current issues are controlled or eliminated.;Short Term goal: Identification and review with participant of any Quality of Life or Depression concerns found by scoring the  questionnaire.;Long Term goal: The participant improves quality of Life and PHQ9 Scores as seen by post scores and/or verbalization of changes       Quality of Life Scores:  Scores of 19 and below usually indicate a poorer quality of life in these areas.  A difference of  2-3 points is a clinically meaningful difference.  A difference of 2-3 points in the total score of the Quality of Life Index has been associated with significant improvement in overall quality of life, self-image, physical symptoms, and general health in studies assessing change in quality of life.  PHQ-9: Recent Review Flowsheet Data    Depression screen [facility] 2/9 04/21/2018 03/14/2018   Decreased Interest 0 0   Down, Depressed, Hopeless 0 1   PHQ - 2 Score 0 1   Altered sleeping 0 0   Tired, decreased energy 0 1   Change in appetite 1 1   Feeling bad or failure about yourself  0 1   Trouble concentrating 0 0   Moving slowly or fidgety/restless 0 0   Suicidal thoughts 0 0   PHQ-9 Score 1 4   Difficult doing work/chores Not difficult at all Not difficult at all     Interpretation of Total Score  Total Score Depression Severity:  1-4 = Minimal depression, 5-9 = Mild depression, 10-14 = Moderate depression, 15-19 = Moderately severe depression, 20-27 = Severe depression   Psychosocial Evaluation and Intervention: Psychosocial Evaluation - 04/19/18 1237      Psychosocial Evaluation & Interventions   Interventions  Encouraged to exercise with the program and follow exercise prescription;Relaxation education    Comments  Counselor met with Mr. Hanrahan Gaumer) today for initial psychosocial evaluation.  He is a 62 year old who has a history of COPD and fell; puncturing his lung in November of 2018 which he reports has made it more difficult for him to breathe since that time.  Shakil has a strong support system with a spouse of 64 years; several adult children locally; a granddaughter; and Antrone is actively involved in his  local church.  He reports not sleeping well his entire life - with ~ 5 hours per night.  His wife states he snores and he has never had a sleep study.  Ivory has a good appetite and denies a history of depression or anxiety other than following his most recent hospitalization.  He states he is typically in a positive mood and has minimal stress other than his health.  Aamir has goals to improve his breathing and maybe decrease the soreness in his diaphragm area with the practice of consistent exercise.  Counselor encouraged Vollie to speak with his Dr. about a possible sleep study due to his chronic history of less than 5 hours of sleep.  Staff will follow with him.     Expected Outcomes  Short:  Geo will check with his Dr.  about a possible sleep study due to his chronic sleep patterns.   Long:  [place] will exercise consistently to improve his breathing and for positive self-care.      Continue Psychosocial Services   Follow up required by staff       Psychosocial Re-Evaluation:   Psychosocial Discharge (Final Psychosocial Re-Evaluation):   Education: Education Goals: Education classes will be provided on a weekly basis, covering required topics. Participant will state understanding/return demonstration of topics presented.  Learning Barriers/Preferences: Learning Barriers/Preferences - 03/14/18 1137      Learning Barriers/Preferences   Learning Barriers  Sight wears reading glasses    Learning Preferences  None       Education Topics:  Initial Evaluation Education: - Verbal, written and demonstration of respiratory meds, oximetry and breathing techniques. Instruction on use of nebulizers and MDIs and importance of monitoring MDI activations.   Pulmonary Rehab from 05/01/2018 in [facility] Cardiac and Pulmonary Rehab  Date  03/14/18  Educator  [facility]  Instruction Review Code  1- Verbalizes Understanding      General Nutrition Guidelines/Fats and Fiber: -Group instruction provided by verbal,  written material, models and posters to present the general guidelines for heart healthy nutrition. Gives an explanation and review of dietary fats and fiber.   Pulmonary Rehab from 05/01/2018 in [facility] Cardiac and Pulmonary Rehab  Date  04/24/18  Educator  [facility]  Instruction Review Code  1- Verbalizes Understanding      Controlling Sodium/Reading Food Labels: -Group verbal and written material supporting the discussion of sodium use in heart healthy nutrition. Review and explanation with models, verbal and written materials for utilization of the food label.   Pulmonary Rehab from 05/01/2018 in [facility] Cardiac and Pulmonary Rehab  Date  05/01/18  Educator  CR  Instruction Review Code  1- Verbalizes Understanding      Exercise Physiology & General Exercise Guidelines: - Group verbal and written instruction with models to review the exercise physiology of the cardiovascular system and associated critical values. Provides general exercise guidelines with specific guidelines to those with heart or lung disease.    Pulmonary Rehab from 05/01/2018 in [facility] Cardiac and Pulmonary Rehab  Date  03/31/18  Educator  [facility]  Instruction Review Code  1- Verbalizes Understanding      Aerobic Exercise & Resistance Training: - Gives group verbal and written instruction on the various components of exercise. Focuses on aerobic and resistive training programs and the benefits of this training and how to safely progress through these programs.   Pulmonary Rehab from 05/01/2018 in [facility] Cardiac and Pulmonary Rehab  Date  04/26/18  Educator  [facility]  Instruction Review Code  1- Verbalizes Understanding      Flexibility, Balance, Mind/Body Relaxation: Provides group verbal/written instruction on the benefits of flexibility and balance training, including mind/body exercise modes such as yoga, pilates and tai chi.  Demonstration and skill practice provided.   Stress and Anxiety: - Provides group verbal and written  instruction about the health risks of elevated stress and causes of high stress.  Discuss the correlation between heart/lung disease and anxiety and treatment options. Review healthy ways to manage with stress and anxiety.   Depression: - Provides group verbal and written instruction on the correlation between heart/lung disease and depressed mood, treatment options, and the stigmas associated with seeking treatment.   Pulmonary Rehab from 05/01/2018 in [facility] Cardiac and Pulmonary Rehab  Date  04/19/18  Educator  [facility]  Instruction Review Code  1- Verbalizes Understanding  Exercise & Equipment Safety: - Individual verbal instruction and demonstration of equipment use and safety with use of the equipment.   Pulmonary Rehab from 05/01/2018 in [facility] Cardiac and Pulmonary Rehab  Date  03/14/18  Educator  [facility]  Instruction Review Code  1- Verbalizes Understanding      Infection Prevention: - Provides verbal and written material to individual with discussion of infection control including proper hand washing and proper equipment cleaning during exercise session.   Pulmonary Rehab from 05/01/2018 in [facility] Cardiac and Pulmonary Rehab  Date  03/14/18  Educator  [facility]  Instruction Review Code  1- Verbalizes Understanding      Falls Prevention: - Provides verbal and written material to individual with discussion of falls prevention and safety.   Pulmonary Rehab from 05/01/2018 in [facility] Cardiac and Pulmonary Rehab  Date  03/14/18  Educator  [facility]  Instruction Review Code  1- Verbalizes Understanding      Diabetes: - Individual verbal and written instruction to review signs/symptoms of diabetes, desired ranges of glucose level fasting, after meals and with exercise. Advice that pre and post exercise glucose checks will be done for 3 sessions at entry of program.   Chronic Lung Diseases: - Group verbal and written instruction to review updates, respiratory medications, advancements in procedures and  treatments. Discuss use of supplemental oxygen including available portable oxygen systems, continuous and intermittent flow rates, concentrators, personal use and safety guidelines. Review proper use of inhaler and spacers. Provide informative websites for self-education.    Pulmonary Rehab from 05/01/2018 in [facility] Cardiac and Pulmonary Rehab  Date  03/29/18  Educator  [facility]  Instruction Review Code  1- Verbalizes Understanding      Energy Conservation: - Provide group verbal and written instruction for methods to conserve energy, plan and organize activities. Instruct on pacing techniques, use of adaptive equipment and posture/positioning to relieve shortness of breath.   Triggers and Exacerbations: - Group verbal and written instruction to review types of environmental triggers and ways to prevent exacerbations. Discuss weather changes, air quality and the benefits of nasal washing. Review warning signs and symptoms to help prevent infections. Discuss techniques for effective airway clearance, coughing, and vibrations.   Pulmonary Rehab from 05/01/2018 in [facility] Cardiac and Pulmonary Rehab  Date  04/14/18  Educator  [facility]  Instruction Review Code  1- Verbalizes Understanding      AED/CPR: - Group verbal and written instruction with the use of models to demonstrate the basic use of the AED with the basic ABC's of resuscitation.   Anatomy and Physiology of the Lungs: - Group verbal and written instruction with the use of models to provide basic lung anatomy and physiology related to function, structure and complications of lung disease.   Anatomy & Physiology of the Heart: - Group verbal and written instruction and models provide basic cardiac anatomy and physiology, with the coronary electrical and arterial systems. Review of Valvular disease and Heart Failure   Cardiac Medications: - Group verbal and written instruction to review commonly prescribed medications for heart disease. Reviews  the medication, class of the drug, and side effects.   Know Your Numbers and Risk Factors: -Group verbal and written instruction about important numbers in your health.  Discussion of what are risk factors and how they play a role in the disease process.  Review of Cholesterol, Blood Pressure, Diabetes, and BMI and the role they play in your overall health.   Pulmonary Rehab from 05/01/2018 in [facility] Cardiac and Pulmonary  Rehab  Date  04/12/18  Educator  [place]  Instruction Review Code  1- Verbalizes Understanding      Sleep Hygiene: -Provides group verbal and written instruction about how sleep can affect your health.  Define sleep hygiene, discuss sleep cycles and impact of sleep habits. Review good sleep hygiene tips.    Pulmonary Rehab from 05/01/2018 in [facility] Cardiac and Pulmonary Rehab  Date  04/05/18  Educator  [facility]  Instruction Review Code  1- Verbalizes Understanding      Other: -Provides group and verbal instruction on various topics (see comments)    Knowledge Questionnaire Score: Knowledge Questionnaire Score - 04/21/18 1224      Knowledge Questionnaire Score   Pre Score  14/18    Post Score  17/18 reviewed with patient        Core Components/Risk Factors/Patient Goals at Admission: Personal Goals and Risk Factors at Admission - 03/14/18 1149      Core Components/Risk Factors/Patient Goals on Admission    Weight Management  Yes;Weight Maintenance;Weight Loss    Intervention  Weight Management: Develop a combined nutrition and exercise program designed to reach desired caloric intake, while maintaining appropriate intake of nutrient and fiber, sodium and fats, and appropriate energy expenditure required for the weight goal.;Weight Management: Provide education and appropriate resources to help participant work on and attain dietary goals.;Weight Management/Obesity: Establish reasonable short term and long term weight goals.    Admit Weight  179 lb 1.6 oz (81.2 kg)    Goal  Weight: Short Term  174 lb (78.9 kg)    Goal Weight: Long Term  170 lb (77.1 kg)    Expected Outcomes  Short Term: Continue to assess and modify interventions until short term weight is achieved;Long Term: Adherence to nutrition and physical activity/exercise program aimed toward attainment of established weight goal;Weight Maintenance: Understanding of the daily nutrition guidelines, which includes 25-35% calories from fat, 7% or less cal from saturated fats, less than 264m cholesterol, less than 1.5gm of sodium, & 5 or more servings of fruits and vegetables daily;Understanding recommendations for meals to include 15-35% energy as protein, 25-35% energy from fat, 35-60% energy from carbohydrates, less than 2071mof dietary cholesterol, 20-35 gm of total fiber daily;Understanding of distribution of calorie intake throughout the day with the consumption of 4-5 meals/snacks    Improve shortness of breath with ADL's  Yes    Intervention  Provide education, individualized exercise plan and daily activity instruction to help decrease symptoms of SOB with activities of daily living.    Expected Outcomes  Short Term: Improve cardiorespiratory fitness to achieve a reduction of symptoms when performing ADLs;Long Term: Be able to perform more ADLs without symptoms or delay the onset of symptoms    Hypertension  Yes    Intervention  Provide education on lifestyle modifcations including regular physical activity/exercise, weight management, moderate sodium restriction and increased consumption of fresh fruit, vegetables, and low fat dairy, alcohol moderation, and smoking cessation.;Monitor prescription use compliance.    Expected Outcomes  Short Term: Continued assessment and intervention until BP is < 140/9042mG in hypertensive participants. < 130/39m57m in hypertensive participants with diabetes, heart failure or chronic kidney disease.;Long Term: Maintenance of blood pressure at goal levels.       Core  Components/Risk Factors/Patient Goals Review:    Core Components/Risk Factors/Patient Goals at Discharge (Final Review):    ITP Comments: ITP Comments    Row Name 03/14/18 1051 04/10/18 1523 05/03/18 1152  ITP Comments  Medical Evaluation completed. Chart sent for review and changes to Dr. Emily Filbert Director of [place]. Diagnosis can be found in [facility] encounter 02/16/18   30 day review completed. ITP sent to Dr. Emily Filbert Director of [place]. Continue with ITP unless changes are made by physician  Discharge ITP sent to Dr. Emily Filbert director of [place]. Discharge summery routed to PCP and Pulmonologist.        Comments: Discharge ITP

## 2018-05-03 NOTE — Patient Instructions (Signed)
Discharge Patient Instructions  Patient Details  Name: Derrick Sheppard MRN: [id number] Date of Birth: [birth year] Referring Provider:  Adin Hector, MD   Number of Visits: 20/20  Reason for Discharge:  Patient reached a stable level of exercise. Patient independent in their exercise. Patient has met program and personal goals.  Smoking History:  Social History   Tobacco Use  Smoking Status Former Smoker  . Packs/day: 1.50  . Years: 46.00  . Pack years: 69.00  . Types: Cigarettes  . Last attempt to quit: 09/01/2017  . Years since quitting: 0.6  Smokeless Tobacco Never Used    Diagnosis:  Simple chronic bronchitis (HCC)  Initial Exercise Prescription: Initial Exercise Prescription - 03/14/18 1300      Date of Initial Exercise RX and Referring Provider   Date  03/14/18      Treadmill   MPH  2.3    Grade  0.5    Minutes  15    METs  2.92      Recumbant Bike   Level  3    RPM  60    Watts  23    Minutes  15    METs  2.9      T5 Nustep   Level  2    SPM  80    Minutes  15    METs  2.9      Prescription Details   Frequency (times per week)  3    Duration  Progress to 45 minutes of aerobic exercise without signs/symptoms of physical distress      Intensity   THRR 40-80% of Max Heartrate  98-137    Ratings of Perceived Exertion  11-15    Perceived Dyspnea  0-4      Resistance Training   Training Prescription  Yes    Weight  4 lb    Reps  10-15       Discharge Exercise Prescription (Final Exercise Prescription Changes): Exercise Prescription Changes - 04/25/18 1400      Response to Exercise   Blood Pressure (Admit)  130/70    Blood Pressure (Exit)  138/80    Heart Rate (Admit)  73 bpm    Heart Rate (Exercise)  96 bpm    Heart Rate (Exit)  78 bpm    Oxygen Saturation (Admit)  95 %    Oxygen Saturation (Exercise)  93 %    Oxygen Saturation (Exit)  94 %    Rating of Perceived Exertion (Exercise)  11    Perceived Dyspnea (Exercise)  1    Symptoms  none    Duration  Continue with 45 min of aerobic exercise without signs/symptoms of physical distress.    Intensity  THRR unchanged      Progression   Progression  Continue to progress workloads to maintain intensity without signs/symptoms of physical distress.    Average METs  3      Resistance Training   Training Prescription  Yes    Weight  5 lbs    Reps  10-15      Interval Training   Interval Training  No      Treadmill   MPH  2.2    Grade  0.5    Minutes  15    METs  2.84      Recumbant Bike   Level  6    Watts  41    Minutes  15    METs  3.57  T5 Nustep   Level  5    Minutes  15    METs  2.6      Home Exercise Plan   Plans to continue exercise at  [employer] (comment) church gym    Frequency  Add 1 additional day to program exercise sessions.    Initial Home Exercises Provided  03/29/18       Functional Capacity: 6 Minute Walk    Row Name 03/14/18 1320 04/24/18 1245 04/28/18 1150     6 Minute Walk   Phase  -  Discharge  Discharge redo before weights as it would fatigue him   Distance  1214 feet  1230 feet  1488 feet   Distance % Change  -  1.3 %  18.4 %   Distance Feet Change  -  16 ft  274 ft   Walk Time  6 minutes  6 minutes  6 minutes   # of Rest Breaks  0  0  0   MPH  2.3  2.33  2.81   METS  3.15  3.15  3.61   RPE  11  11  11    Perceived Dyspnea   3  1  3    VO2 Peak  11.03  11.01  12.63   Symptoms  No  No  Yes (comment)   Comments  -  -  SOB at end   Resting HR  59 bpm  73 bpm  71 bpm   Resting BP  150/82  130/70  126/74   Resting Oxygen Saturation   96 %  95 %  92 %   Exercise Oxygen Saturation  during 6 min walk  91 %  92 %  89 %   Max Ex. HR  86 bpm  98 bpm  103 bpm   Max Ex. BP  166/84  142/80  136/64   2 Minute Post BP  152/84  138/80  134/60     Interval HR   1 Minute HR  71  97  -   2 Minute HR  84  98  -   3 Minute HR  -  98  -   4 Minute HR  86  90  -   5 Minute HR  84  87  -   6 Minute HR  86  94  -    2 Minute Post HR  65  64  -   Interval Heart Rate?  Yes  Yes  -     Interval Oxygen   Interval Oxygen?  Yes  Yes  Yes   Baseline Oxygen Saturation %  96 %  95 %  92 %   1 Minute Oxygen Saturation %  93 %  95 %  94 %   1 Minute Liters of Oxygen  0 L  0 L  0 L Room Air   2 Minute Oxygen Saturation %  91 %  94 %  92 %   2 Minute Liters of Oxygen  0 L  0 L  0 L   3 Minute Oxygen Saturation %  92 %  93 %  91 %   3 Minute Liters of Oxygen  0 L  0 L  0 L   4 Minute Oxygen Saturation %  92 %  94 %  91 %   4 Minute Liters of Oxygen  0 L  0 L  0 L   5 Minute Oxygen Saturation %  92 %  93 %  90 %   5 Minute Liters of Oxygen  0 L  93 L  0 L   6 Minute Oxygen Saturation %  93 %  92 %  89 %   6 Minute Liters of Oxygen  0 L  0 L  0 L   2 Minute Post Oxygen Saturation %  97 %  98 %  94 %   2 Minute Post Liters of Oxygen  0 L  0 L  0 L      Quality of Life:   Personal Goals: Goals established at orientation with interventions provided to work toward goal. Personal Goals and Risk Factors at Admission - 03/14/18 1149      Core Components/Risk Factors/Patient Goals on Admission    Weight Management  Yes;Weight Maintenance;Weight Loss    Intervention  Weight Management: Develop a combined nutrition and exercise program designed to reach desired caloric intake, while maintaining appropriate intake of nutrient and fiber, sodium and fats, and appropriate energy expenditure required for the weight goal.;Weight Management: Provide education and appropriate resources to help participant work on and attain dietary goals.;Weight Management/Obesity: Establish reasonable short term and long term weight goals.    Admit Weight  179 lb 1.6 oz (81.2 kg)    Goal Weight: Short Term  174 lb (78.9 kg)    Goal Weight: Long Term  170 lb (77.1 kg)    Expected Outcomes  Short Term: Continue to assess and modify interventions until short term weight is achieved;Long Term: Adherence to nutrition and physical  activity/exercise program aimed toward attainment of established weight goal;Weight Maintenance: Understanding of the daily nutrition guidelines, which includes 25-35% calories from fat, 7% or less cal from saturated fats, less than 267m cholesterol, less than 1.5gm of sodium, & 5 or more servings of fruits and vegetables daily;Understanding recommendations for meals to include 15-35% energy as protein, 25-35% energy from fat, 35-60% energy from carbohydrates, less than 202mof dietary cholesterol, 20-35 gm of total fiber daily;Understanding of distribution of calorie intake throughout the day with the consumption of 4-5 meals/snacks    Improve shortness of breath with ADL's  Yes    Intervention  Provide education, individualized exercise plan and daily activity instruction to help decrease symptoms of SOB with activities of daily living.    Expected Outcomes  Short Term: Improve cardiorespiratory fitness to achieve a reduction of symptoms when performing ADLs;Long Term: Be able to perform more ADLs without symptoms or delay the onset of symptoms    Hypertension  Yes    Intervention  Provide education on lifestyle modifcations including regular physical activity/exercise, weight management, moderate sodium restriction and increased consumption of fresh fruit, vegetables, and low fat dairy, alcohol moderation, and smoking cessation.;Monitor prescription use compliance.    Expected Outcomes  Short Term: Continued assessment and intervention until BP is < 140/9029mG in hypertensive participants. < 130/12m14m in hypertensive participants with diabetes, heart failure or chronic kidney disease.;Long Term: Maintenance of blood pressure at goal levels.        Personal Goals Discharge:   Exercise Goals and Review: Exercise Goals    Row Name 03/14/18 1320             Exercise Goals   Increase Physical Activity  Yes       Intervention  Provide advice, education, support and counseling about physical  activity/exercise needs.;Develop an individualized exercise prescription for aerobic and resistive training based on initial evaluation findings, risk stratification, comorbidities and  participant's personal goals.       Expected Outcomes  Short Term: Attend rehab on a regular basis to increase amount of physical activity.;Long Term: Add in home exercise to make exercise part of routine and to increase amount of physical activity.;Long Term: Exercising regularly at least 3-5 days a week.       Increase Strength and Stamina  Yes       Intervention  Provide advice, education, support and counseling about physical activity/exercise needs.;Develop an individualized exercise prescription for aerobic and resistive training based on initial evaluation findings, risk stratification, comorbidities and participant's personal goals.       Expected Outcomes  Short Term: Increase workloads from initial exercise prescription for resistance, speed, and METs.;Short Term: Perform resistance training exercises routinely during rehab and add in resistance training at home;Long Term: Improve cardiorespiratory fitness, muscular endurance and strength as measured by increased METs and functional capacity (6MWT)       Able to understand and use rate of perceived exertion (RPE) scale  Yes       Intervention  Provide education and explanation on how to use RPE scale       Expected Outcomes  Short Term: Able to use RPE daily in rehab to express subjective intensity level;Long Term:  Able to use RPE to guide intensity level when exercising independently       Able to understand and use Dyspnea scale  Yes       Intervention  Provide education and explanation on how to use Dyspnea scale       Expected Outcomes  Short Term: Able to use Dyspnea scale daily in rehab to express subjective sense of shortness of breath during exertion;Long Term: Able to use Dyspnea scale to guide intensity level when exercising independently        Knowledge and understanding of Target Heart Rate Range (THRR)  Yes       Intervention  Provide education and explanation of THRR including how the numbers were predicted and where they are located for reference       Expected Outcomes  Short Term: Able to state/look up THRR;Short Term: Able to use daily as guideline for intensity in rehab;Long Term: Able to use THRR to govern intensity when exercising independently       Able to check pulse independently  Yes       Intervention  Provide education and demonstration on how to check pulse in carotid and radial arteries.;Review the importance of being able to check your own pulse for safety during independent exercise       Expected Outcomes  Short Term: Able to explain why pulse checking is important during independent exercise;Long Term: Able to check pulse independently and accurately       Understanding of Exercise Prescription  Yes       Intervention  Provide education, explanation, and written materials on patient's individual exercise prescription       Expected Outcomes  Short Term: Able to explain program exercise prescription;Long Term: Able to explain home exercise prescription to exercise independently          Nutrition & Weight - Outcomes: Pre Biometrics - 03/14/18 1319      Pre Biometrics   Height  5' 6.5" (1.689 m)    Weight  179 lb 1.6 oz (81.2 kg)    Waist Circumference  41 inches    Hip Circumference  39.5 inches    Waist to Hip Ratio  1.04 %  BMI (Calculated)  28.48      Post Biometrics - 04/24/18 1250       Post  Biometrics   Height  5' 6.5" (1.689 m)    Weight  180 lb (81.6 kg)    Waist Circumference  40 inches    Hip Circumference  39 inches    Waist to Hip Ratio  1.03 %    BMI (Calculated)  28.62       Nutrition: Nutrition Therapy & Goals - 03/14/18 1131      Personal Nutrition Goals   Comments  Lose some weight. Meet with the [facility], educate and  counsel regarding individualized specific dietary modifications aiming towards targeted core components such as weight, hypertension, lipid management, diabetes, heart failure and other comorbidities.;Nutrition handout(s) given to patient.    Expected Outcomes  Short Term Goal: A plan has been developed with personal nutrition goals set during dietitian appointment.;Short Term Goal: Understand basic principles of dietary content, such as calories, fat, sodium, cholesterol and nutrients.;Long Term Goal: Adherence to prescribed nutrition plan.       Nutrition Discharge: Nutrition Assessments - 04/21/18 1229      MEDFICTS Scores   Pre Score  19    Post Score  50    Score Difference  31       Education Questionnaire Score: Knowledge Questionnaire Score - 04/21/18 1224      Knowledge Questionnaire Score   Pre Score  14/18    Post Score  17/18 reviewed with patient       Goals reviewed with patient; copy given to patient.

## 2018-05-03 NOTE — Progress Notes (Signed)
Daily Session Note  Patient Details  Name: Derrick Sheppard MRN: [id number] Date of Birth: Nov 06, 1955 Referring Provider:    Encounter Date: 05/03/2018  Check In: Session Check In - 05/03/18 1145      Check-In   Location  [facility]    Staff Present  Justin Mend RCP,RRT,BSRT;Meredith Sherryll Burger, RN BSN;Jessica Luan Pulling, MA, RCEP, CCRP, Exercise Physiologist    Supervising physician immediately available to respond to emergencies  [facility] immediately available ER MD    Physician(s)  Dr. Burlene Arnt and [facility]    Medication changes reported      No    Fall or balance concerns reported     No    Warm-up and Cool-down  Performed as group-led instruction    Resistance Training Performed  Yes    VAD Patient?  No      Pain Assessment   Currently in Pain?  No/denies          Social History   Tobacco Use  Smoking Status Former Smoker  . Packs/day: 1.50  . Years: 46.00  . Pack years: 69.00  . Types: Cigarettes  . Last attempt to quit: 09/01/2017  . Years since quitting: 0.6  Smokeless Tobacco Never Used    Goals Met:  Proper associated with RPD/PD & O2 Sat Independence with exercise equipment Improved SOB with ADL's Exercise tolerated well No report of cardiac concerns or symptoms Strength training completed today  Goals Unmet:  Not Applicable  Comments:  Derrick Sheppard graduated today from  rehab with 20 sessions completed.  Details of the patient's exercise prescription and what He needs to do in order to continue the prescription and progress were discussed with patient.  Patient was given a copy of prescription and goals.  Patient verbalized understanding.  Derrick Sheppard plans to continue to exercise by walking at the park.   Dr. Emily Filbert is Medical Director for [facility] and [facility].

## 2018-05-03 NOTE — Progress Notes (Signed)
Discharge Progress Report  Patient Details  Name: Derrick Sheppard MRN: [id number] Date of Birth: 1956/03/02 Referring Provider:     Number of Visits: 20/20  Reason for Discharge:  Patient reached a stable level of exercise. Patient independent in their exercise. Patient has met program and personal goals.  Smoking History:  Social History   Tobacco Use  Smoking Status Former Smoker  . Packs/day: 1.50  . Years: 46.00  . Pack years: 69.00  . Types: Cigarettes  . Last attempt to quit: 09/01/2017  . Years since quitting: 0.6  Smokeless Tobacco Never Used    Diagnosis:  Simple chronic bronchitis ([place])  ADL [facility]: Pulmonary Assessment Scores    Row Name 03/14/18 1110 04/21/18 1225 04/24/18 1250     ADL [facility]   ADL Phase  Entry  Exit  Exit   SOB Score total  48  9  -   Rest  1  1  -   Walk  1  0  -   Stairs  3  1  -   Bath  3  1  -   Dress  1  0  -   Shop  2  0  -     CAT Score   CAT Score  15  6  -     mMRC Score   mMRC Score  1  -  1      Initial Exercise Prescription: Initial Exercise Prescription - 03/14/18 1300      Date of Initial Exercise RX and Referring Provider   Date  03/14/18      Treadmill   MPH  2.3    Grade  0.5    Minutes  15    METs  2.92      Recumbant Bike   Level  3    RPM  60    Watts  23    Minutes  15    METs  2.9      T5 Nustep   Level  2    SPM  80    Minutes  15    METs  2.9      Prescription Details   Frequency (times per week)  3    Duration  Progress to 45 minutes of aerobic exercise without signs/symptoms of physical distress      Intensity   THRR 40-80% of Max Heartrate  98-137    Ratings of Perceived Exertion  11-15    Perceived Dyspnea  0-4      Resistance Training   Training Prescription  Yes    Weight  4 lb    Reps  10-15       Discharge Exercise Prescription (Final Exercise Prescription Changes): Exercise Prescription Changes - 04/25/18 1400      Response to Exercise   Blood Pressure (Admit)   130/70    Blood Pressure (Exit)  138/80    Heart Rate (Admit)  73 bpm    Heart Rate (Exercise)  96 bpm    Heart Rate (Exit)  78 bpm    Oxygen Saturation (Admit)  95 %    Oxygen Saturation (Exercise)  93 %    Oxygen Saturation (Exit)  94 %    Rating of Perceived Exertion (Exercise)  11    Perceived Dyspnea (Exercise)  1    Symptoms  none    Duration  Continue with 45 min of aerobic exercise without signs/symptoms of physical distress.    Intensity  THRR  unchanged      Progression   Progression  Continue to progress workloads to maintain intensity without signs/symptoms of physical distress.    Average METs  3      Resistance Training   Training Prescription  Yes    Weight  5 lbs    Reps  10-15      Interval Training   Interval Training  No      Treadmill   MPH  2.2    Grade  0.5    Minutes  15    METs  2.84      Recumbant Bike   Level  6    Watts  41    Minutes  15    METs  3.57      T5 Nustep   Level  5    Minutes  15    METs  2.6      Home Exercise Plan   Plans to continue exercise at  [employer] (comment) church gym    Frequency  Add 1 additional day to program exercise sessions.    Initial Home Exercises Provided  03/29/18       Functional Capacity: 6 Minute Walk    Row Name 03/14/18 1320 04/24/18 1245 04/28/18 1150     6 Minute Walk   Phase  -  Discharge  Discharge redo before weights as it would fatigue him   Distance  1214 feet  1230 feet  1488 feet   Distance % Change  -  1.3 %  18.4 %   Distance Feet Change  -  16 ft  274 ft   Walk Time  6 minutes  6 minutes  6 minutes   # of Rest Breaks  0  0  0   MPH  2.3  2.33  2.81   METS  3.15  3.15  3.61   RPE  _0 Perceived Dyspnea   _1 VO2 Peak  11.03  11.01  12.63   Symptoms  No  No  Yes (comment)   Comments  -  -  SOB at end   Resting HR  59 bpm  73 bpm  71 bpm   Resting BP  150/82  130/70  126/74   Resting Oxygen Saturation   96 %  95 %  92 %   Exercise Oxygen  Saturation  during 6 min walk  91 %  92 %  89 %   Max Ex. HR  86 bpm  98 bpm  103 bpm   Max Ex. BP  166/84  142/80  136/64   2 Minute Post BP  152/84  138/80  134/60     Interval HR   1 Minute HR  71  97  -   2 Minute HR  84  98  -   3 Minute HR  -  98  -   4 Minute HR  86  90  -   5 Minute HR  84  87  -   6 Minute HR  86  94  -   2 Minute Post HR  65  64  -   Interval Heart Rate?  Yes  Yes  -     Interval Oxygen   Interval Oxygen?  Yes  Yes  Yes   Baseline Oxygen Saturation %  96 %  95 %  92 %   1 Minute Oxygen Saturation %  93 %  95 %  94 %   1 Minute Liters of Oxygen  0 L  0 L  0 L Room Air   2 Minute Oxygen Saturation %  91 %  94 %  92 %   2 Minute Liters of Oxygen  0 L  0 L  0 L   3 Minute Oxygen Saturation %  92 %  93 %  91 %   3 Minute Liters of Oxygen  0 L  0 L  0 L   4 Minute Oxygen Saturation %  92 %  94 %  91 %   4 Minute Liters of Oxygen  0 L  0 L  0 L   5 Minute Oxygen Saturation %  92 %  93 %  90 %   5 Minute Liters of Oxygen  0 L  93 L  0 L   6 Minute Oxygen Saturation %  93 %  92 %  89 %   6 Minute Liters of Oxygen  0 L  0 L  0 L   2 Minute Post Oxygen Saturation %  97 %  98 %  94 %   2 Minute Post Liters of Oxygen  0 L  0 L  0 L      Psychological, QOL, Others - Outcomes: PHQ 2/9: Depression screen [facility] 2/9 04/21/2018 03/14/2018  Decreased Interest 0 0  Down, Depressed, Hopeless 0 1  PHQ - 2 Score 0 1  Altered sleeping 0 0  Tired, decreased energy 0 1  Change in appetite 1 1  Feeling bad or failure about yourself  0 1  Trouble concentrating 0 0  Moving slowly or fidgety/restless 0 0  Suicidal thoughts 0 0  PHQ-9 Score 1 4  Difficult doing work/chores Not difficult at all Not difficult at all    Quality of Life:   Personal Goals: Goals established at orientation with interventions provided to work toward goal. Personal Goals and Risk Factors at Admission - 03/14/18 1149      Core Components/Risk Factors/Patient Goals on Admission    Weight  Management  Yes;Weight Maintenance;Weight Loss    Intervention  Weight Management: Develop a combined nutrition and exercise program designed to reach desired caloric intake, while maintaining appropriate intake of nutrient and fiber, sodium and fats, and appropriate energy expenditure required for the weight goal.;Weight Management: Provide education and appropriate resources to help participant work on and attain dietary goals.;Weight Management/Obesity: Establish reasonable short term and long term weight goals.    Admit Weight  179 lb 1.6 oz (81.2 kg)    Goal Weight: Short Term  174 lb (78.9 kg)    Goal Weight: Long Term  170 lb (77.1 kg)    Expected Outcomes  Short Term: Continue to assess and modify interventions until short term weight is achieved;Long Term: Adherence to nutrition and physical activity/exercise program aimed toward attainment of established weight goal;Weight Maintenance: Understanding of the daily nutrition guidelines, which includes 25-35% calories from fat, 7% or less cal from saturated fats, less than 239m cholesterol, less than 1.5gm of sodium, & 5 or more servings of fruits and vegetables daily;Understanding recommendations for meals to include 15-35% energy as protein, 25-35% energy from fat, 35-60% energy from carbohydrates, less than 2034mof dietary cholesterol, 20-35 gm of total fiber daily;Understanding of distribution of calorie intake throughout the day with the consumption of 4-5 meals/snacks    Improve shortness of breath with ADL's  Yes  Intervention  Provide education, individualized exercise plan and daily activity instruction to help decrease symptoms of SOB with activities of daily living.    Expected Outcomes  Short Term: Improve cardiorespiratory fitness to achieve a reduction of symptoms when performing ADLs;Long Term: Be able to perform more ADLs without symptoms or delay the onset of symptoms    Hypertension  Yes    Intervention  Provide education on  lifestyle modifcations including regular physical activity/exercise, weight management, moderate sodium restriction and increased consumption of fresh fruit, vegetables, and low fat dairy, alcohol moderation, and smoking cessation.;Monitor prescription use compliance.    Expected Outcomes  Short Term: Continued assessment and intervention until BP is < 140/36m HG in hypertensive participants. < 130/83mHG in hypertensive participants with diabetes, heart failure or chronic kidney disease.;Long Term: Maintenance of blood pressure at goal levels.        Personal Goals Discharge:   Exercise Goals and Review: Exercise Goals    Row Name 03/14/18 1320             Exercise Goals   Increase Physical Activity  Yes       Intervention  Provide advice, education, support and counseling about physical activity/exercise needs.;Develop an individualized exercise prescription for aerobic and resistive training based on initial evaluation findings, risk stratification, comorbidities and participant's personal goals.       Expected Outcomes  Short Term: Attend rehab on a regular basis to increase amount of physical activity.;Long Term: Add in home exercise to make exercise part of routine and to increase amount of physical activity.;Long Term: Exercising regularly at least 3-5 days a week.       Increase Strength and Stamina  Yes       Intervention  Provide advice, education, support and counseling about physical activity/exercise needs.;Develop an individualized exercise prescription for aerobic and resistive training based on initial evaluation findings, risk stratification, comorbidities and participant's personal goals.       Expected Outcomes  Short Term: Increase workloads from initial exercise prescription for resistance, speed, and METs.;Short Term: Perform resistance training exercises routinely during rehab and add in resistance training at home;Long Term: Improve cardiorespiratory fitness, muscular  endurance and strength as measured by increased METs and functional capacity (6MWT)       Able to understand and use rate of perceived exertion (RPE) scale  Yes       Intervention  Provide education and explanation on how to use RPE scale       Expected Outcomes  Short Term: Able to use RPE daily in rehab to express subjective intensity level;Long Term:  Able to use RPE to guide intensity level when exercising independently       Able to understand and use Dyspnea scale  Yes       Intervention  Provide education and explanation on how to use Dyspnea scale       Expected Outcomes  Short Term: Able to use Dyspnea scale daily in rehab to express subjective sense of shortness of breath during exertion;Long Term: Able to use Dyspnea scale to guide intensity level when exercising independently       Knowledge and understanding of Target Heart Rate Range (THRR)  Yes       Intervention  Provide education and explanation of THRR including how the numbers were predicted and where they are located for reference       Expected Outcomes  Short Term: Able to state/look up THRR;Short Term: Able to use daily as  guideline for intensity in rehab;Long Term: Able to use THRR to govern intensity when exercising independently       Able to check pulse independently  Yes       Intervention  Provide education and demonstration on how to check pulse in carotid and radial arteries.;Review the importance of being able to check your own pulse for safety during independent exercise       Expected Outcomes  Short Term: Able to explain why pulse checking is important during independent exercise;Long Term: Able to check pulse independently and accurately       Understanding of Exercise Prescription  Yes       Intervention  Provide education, explanation, and written materials on patient's individual exercise prescription       Expected Outcomes  Short Term: Able to explain program exercise prescription;Long Term: Able to explain  home exercise prescription to exercise independently          Nutrition & Weight - Outcomes: Pre Biometrics - 03/14/18 1319      Pre Biometrics   Height  5' 6.5" (1.689 m)    Weight  179 lb 1.6 oz (81.2 kg)    Waist Circumference  41 inches    Hip Circumference  39.5 inches    Waist to Hip Ratio  1.04 %    BMI (Calculated)  28.48      Post Biometrics - 04/24/18 1250       Post  Biometrics   Height  5' 6.5" (1.689 m)    Weight  180 lb (81.6 kg)    Waist Circumference  40 inches    Hip Circumference  39 inches    Waist to Hip Ratio  1.03 %    BMI (Calculated)  28.62       Nutrition: Nutrition Therapy & Goals - 03/14/18 1131      Personal Nutrition Goals   Comments  Lose some weight. Meet with the [place], educate and counsel regarding individualized specific dietary modifications aiming towards targeted core components such as weight, hypertension, lipid management, diabetes, heart failure and other comorbidities.;Nutrition handout(s) given to patient.    Expected Outcomes  Short Term Goal: A plan has been developed with personal nutrition goals set during dietitian appointment.;Short Term Goal: Understand basic principles of dietary content, such as calories, fat, sodium, cholesterol and nutrients.;Long Term Goal: Adherence to prescribed nutrition plan.       Nutrition Discharge: Nutrition Assessments - 04/21/18 1229      MEDFICTS Scores   Pre Score  19    Post Score  50    Score Difference  31       Education Questionnaire Score: Knowledge Questionnaire Score - 04/21/18 1224      Knowledge Questionnaire Score   Pre Score  14/18    Post Score  17/18 reviewed with patient       Goals reviewed with patient; copy given to patient.

## 2018-06-09 ENCOUNTER — Other Ambulatory Visit: Payer: Self-pay | Admitting: Specialist

## 2018-06-09 DIAGNOSIS — J90 Pleural effusion, not elsewhere classified: Secondary | ICD-10-CM | POA: Diagnosis not present

## 2018-06-09 DIAGNOSIS — R0789 Other chest pain: Secondary | ICD-10-CM

## 2018-06-09 DIAGNOSIS — J439 Emphysema, unspecified: Secondary | ICD-10-CM | POA: Diagnosis not present

## 2018-06-15 DIAGNOSIS — E538 Deficiency of other specified B group vitamins: Secondary | ICD-10-CM | POA: Diagnosis not present

## 2018-06-15 DIAGNOSIS — I10 Essential (primary) hypertension: Secondary | ICD-10-CM | POA: Diagnosis not present

## 2018-06-15 DIAGNOSIS — E785 Hyperlipidemia, unspecified: Secondary | ICD-10-CM | POA: Diagnosis not present

## 2018-06-19 ENCOUNTER — Ambulatory Visit
Admission: RE | Admit: 2018-06-19 | Discharge: 2018-06-19 | Disposition: A | Discharge status: Home / Self Care | Payer: 59 | Source: Ambulatory Visit | Attending: Specialist | Admitting: Specialist

## 2018-06-19 DIAGNOSIS — I700 Atherosclerosis of aorta: Secondary | ICD-10-CM | POA: Insufficient documentation

## 2018-06-19 DIAGNOSIS — J439 Emphysema, unspecified: Secondary | ICD-10-CM | POA: Diagnosis not present

## 2018-06-19 DIAGNOSIS — R0789 Other chest pain: Secondary | ICD-10-CM | POA: Insufficient documentation

## 2018-06-19 DIAGNOSIS — K8020 Calculus of gallbladder without cholecystitis without obstruction: Secondary | ICD-10-CM | POA: Insufficient documentation

## 2018-06-19 DIAGNOSIS — J90 Pleural effusion, not elsewhere classified: Secondary | ICD-10-CM | POA: Diagnosis not present

## 2018-06-19 DIAGNOSIS — R0602 Shortness of breath: Secondary | ICD-10-CM | POA: Diagnosis not present

## 2018-06-19 DIAGNOSIS — I2510 Atherosclerotic heart disease of native coronary artery without angina pectoris: Secondary | ICD-10-CM | POA: Insufficient documentation

## 2018-06-22 DIAGNOSIS — I2510 Atherosclerotic heart disease of native coronary artery without angina pectoris: Secondary | ICD-10-CM | POA: Diagnosis not present

## 2018-06-22 DIAGNOSIS — D692 Other nonthrombocytopenic purpura: Secondary | ICD-10-CM | POA: Diagnosis not present

## 2018-06-22 DIAGNOSIS — I10 Essential (primary) hypertension: Secondary | ICD-10-CM | POA: Diagnosis not present

## 2018-07-07 ENCOUNTER — Ambulatory Visit: Payer: Self-pay

## 2018-07-07 DIAGNOSIS — I10 Essential (primary) hypertension: Secondary | ICD-10-CM | POA: Diagnosis not present

## 2018-07-07 DIAGNOSIS — R748 Abnormal levels of other serum enzymes: Secondary | ICD-10-CM | POA: Diagnosis not present

## 2018-07-11 DIAGNOSIS — J449 Chronic obstructive pulmonary disease, unspecified: Secondary | ICD-10-CM | POA: Diagnosis not present

## 2018-07-11 DIAGNOSIS — R0902 Hypoxemia: Secondary | ICD-10-CM | POA: Diagnosis not present

## 2018-07-12 ENCOUNTER — Encounter: Payer: Self-pay | Admitting: Urology

## 2018-07-12 ENCOUNTER — Ambulatory Visit (INDEPENDENT_AMBULATORY_CARE_PROVIDER_SITE_OTHER): Payer: 59 | Admitting: Urology

## 2018-07-12 ENCOUNTER — Other Ambulatory Visit: Payer: Self-pay

## 2018-07-12 VITALS — BP 135/78 | HR 82 | Ht 66.0 in | Wt 181.0 lb

## 2018-07-12 DIAGNOSIS — R972 Elevated prostate specific antigen [PSA]: Secondary | ICD-10-CM

## 2018-07-12 NOTE — Progress Notes (Addendum)
   07/12/2018 2:17 PM   Nilda Simmer [place] [birth year] [id number]  Reason for visit: Follow up elevated PSA  HPI: I saw Mr. Thall in urology clinic today for a long history of mildly elevated PSA.  He was previously followed by Dr. Junious Silk.  On chart review his PSA was 4 in 2015.  He has had multiple PSAs over the last 4 months including 4.32 in April 2019, 5.6 in May 2019, and 4.76 in June 2016.  He had a normal DRE with Dr. Junious Silk in June 2019.  He has a 70 g prostate on recent CT scan, which equals a PSA density of 0.07.  He denies any lower urinary tract symptoms or gross hematuria.  No family history of prostate cancer.   ROS: Please see flowsheet from today's date for complete review of systems.  Physical Exam: BP 135/78   Pulse 82   Ht 5\' 6"  (1.676 m)   Wt 181 lb (82.1 kg)   BMI 29.21 kg/m    Constitutional:  Alert and oriented, No acute distress. Respiratory: Normal respiratory effort, no increased work of breathing. GI: Abdomen is soft, nontender, nondistended, no abdominal masses GU: No CVA tenderness Skin: No rashes, bruises or suspicious lesions. Neurologic: Grossly intact, no focal deficits, moving all 4 extremities. Psychiatric: Normal mood and affect  Laboratory Data: PSA: 03/2018: 4.76 02/2018: 5.6 01/2018: 4.3 02/2015: 4 08/2014: 4   Assessment & Plan:   In summary, Mr. Rahimi is a 62 year old male with no family history of prostate cancer and chronically elevated PSA around 4 over the last 4 years.  He has never undergone prostate biopsy.  We reviewed the implications of an elevated PSA and the uncertainty surrounding it. In general, a man's PSA increases with age and is produced by both normal and cancerous prostate tissue. The differential diagnosis for elevated PSA includes BPH, prostate cancer, infection, recent intercourse/ejaculation, recent urethroscopic manipulation (foley placement/cystoscopy) or trauma, and prostatitis.   Management of an  elevated PSA can include observation or prostate biopsy and we discussed this in detail. Our goal is to detect clinically significant prostate cancers, and manage with either active surveillance, surgery, or radiation for localized disease. Risks of prostate biopsy include bleeding, infection (including life threatening sepsis), pain, and lower urinary symptoms. Hematuria, hematospermia, and blood in the stool are all common after biopsy and can persist up to 4 weeks.   His low PSA density and stability of PSA over the last 4 years is reassuring and suggest a benign process.  I recommended repeating a PSA with a free to total ratio to help better stratify his risk.  I suspect the percentage free will be elevated and reassuring, and we can return to yearly PSA screening to age 44.  However if the percentage free is concerning, will consider biopsy moving forward.  I will call him with the PSA results.  ADDENDUM: PSA 4.3, stable from 4 in 2015. %free reassuring at 17.5%. RTC one year for PSA/DRE.   Billey Co, [facility] [address], [place], [place] [address] [phone]

## 2018-07-18 DIAGNOSIS — G4734 Idiopathic sleep related nonobstructive alveolar hypoventilation: Secondary | ICD-10-CM | POA: Insufficient documentation

## 2018-07-19 ENCOUNTER — Other Ambulatory Visit: Payer: 59

## 2018-07-19 DIAGNOSIS — R972 Elevated prostate specific antigen [PSA]: Secondary | ICD-10-CM | POA: Diagnosis not present

## 2018-07-20 ENCOUNTER — Telehealth: Payer: Self-pay | Admitting: Family Medicine

## 2018-07-20 LAB — PSA, TOTAL AND FREE
PSA FREE PCT: 17.4 %
PSA FREE: 0.75 ng/mL
Prostate Specific Ag, Serum: 4.3 ng/mL — ABNORMAL HIGH (ref 0.0–4.0)

## 2018-07-20 NOTE — Telephone Encounter (Signed)
-----   Message from Billey Co, MD sent at 07/20/2018 11:23 AM EDT ----- Regarding: PSA I was unable to reach this patient today to discuss his PSA results.  Please let him know his PSA is 4.3 which is essentially stable from 4 back in 2015. We do not need to do a biopsy. He can follow up in one year for PSA/DRE.  Thanks Nickolas Madrid, MD 07/20/2018

## 2018-07-20 NOTE — Telephone Encounter (Signed)
LMOM for patient to return call.

## 2018-07-21 NOTE — Telephone Encounter (Signed)
-----   Message from Billey Co, MD sent at 07/20/2018 11:23 AM EDT ----- Regarding: PSA I was unable to reach this patient today to discuss his PSA results.  Please let him know his PSA is 4.3 which is essentially stable from 4 back in 2015. We do not need to do a biopsy. He can follow up in one year for PSA/DRE.  Thanks Nickolas Madrid, MD 07/20/2018

## 2018-07-21 NOTE — Telephone Encounter (Signed)
Patient has been notified and I will mail his apps to him.   Sharyn Lull

## 2018-07-21 NOTE — Telephone Encounter (Signed)
I spoke with the patient's wife today but was not able to tell her anything because there is not a DPR on file. Asked her to have him call the office back. His follow up appts have already been made.   Sharyn Lull

## 2018-08-16 DIAGNOSIS — G4734 Idiopathic sleep related nonobstructive alveolar hypoventilation: Secondary | ICD-10-CM | POA: Diagnosis not present

## 2018-08-19 DIAGNOSIS — G4733 Obstructive sleep apnea (adult) (pediatric): Secondary | ICD-10-CM | POA: Diagnosis not present

## 2018-09-11 DIAGNOSIS — J439 Emphysema, unspecified: Secondary | ICD-10-CM | POA: Diagnosis not present

## 2018-09-11 DIAGNOSIS — R0609 Other forms of dyspnea: Secondary | ICD-10-CM | POA: Diagnosis not present

## 2018-09-21 DIAGNOSIS — J449 Chronic obstructive pulmonary disease, unspecified: Secondary | ICD-10-CM | POA: Diagnosis not present

## 2018-09-21 DIAGNOSIS — G4733 Obstructive sleep apnea (adult) (pediatric): Secondary | ICD-10-CM | POA: Diagnosis not present

## 2018-09-30 ENCOUNTER — Other Ambulatory Visit: Payer: Self-pay

## 2018-09-30 DIAGNOSIS — R05 Cough: Secondary | ICD-10-CM | POA: Insufficient documentation

## 2018-09-30 DIAGNOSIS — J189 Pneumonia, unspecified organism: Secondary | ICD-10-CM | POA: Insufficient documentation

## 2018-09-30 DIAGNOSIS — J449 Chronic obstructive pulmonary disease, unspecified: Secondary | ICD-10-CM | POA: Insufficient documentation

## 2018-09-30 DIAGNOSIS — Z79899 Other long term (current) drug therapy: Secondary | ICD-10-CM | POA: Insufficient documentation

## 2018-09-30 DIAGNOSIS — Z87891 Personal history of nicotine dependence: Secondary | ICD-10-CM | POA: Insufficient documentation

## 2018-09-30 DIAGNOSIS — Z7982 Long term (current) use of aspirin: Secondary | ICD-10-CM | POA: Insufficient documentation

## 2018-09-30 NOTE — ED Triage Notes (Signed)
Sore throat for 3 days.

## 2018-10-01 ENCOUNTER — Emergency Department: Payer: 59

## 2018-10-01 ENCOUNTER — Emergency Department
Admission: EM | Admit: 2018-10-01 | Discharge: 2018-10-01 | Disposition: A | Discharge status: Home / Self Care | Payer: 59 | Attending: Student in an Organized Health Care Education/Training Program | Admitting: Student in an Organized Health Care Education/Training Program

## 2018-10-01 DIAGNOSIS — R059 Cough, unspecified: Secondary | ICD-10-CM

## 2018-10-01 DIAGNOSIS — R05 Cough: Secondary | ICD-10-CM

## 2018-10-01 DIAGNOSIS — J189 Pneumonia, unspecified organism: Secondary | ICD-10-CM

## 2018-10-01 LAB — BASIC METABOLIC PANEL WITH GFR
Anion gap: 10 (ref 5–15)
BUN: 11 mg/dL (ref 8–23)
CO2: 23 mmol/L (ref 22–32)
Calcium: 8.6 mg/dL — ABNORMAL LOW (ref 8.9–10.3)
Chloride: 101 mmol/L (ref 98–111)
Creatinine, Ser: 0.89 mg/dL (ref 0.61–1.24)
GFR calc Af Amer: >60 mL/min
GFR calc non Af Amer: >60 mL/min
Glucose, Bld: 110 mg/dL — ABNORMAL HIGH (ref 70–99)
Potassium: 3.3 mmol/L — ABNORMAL LOW (ref 3.5–5.1)
Sodium: 134 mmol/L — ABNORMAL LOW (ref 135–145)

## 2018-10-01 LAB — GROUP A STREP BY PCR: Group A Strep by PCR: NOT DETECTED

## 2018-10-01 LAB — CBC WITH DIFFERENTIAL/PLATELET
ABS IMMATURE GRANULOCYTES: 0.04 10*3/uL (ref 0.00–0.07)
Basophils Absolute: 0.1 10*3/uL (ref 0.0–0.1)
Basophils Relative: 1 %
Eosinophils Absolute: 0.5 10*3/uL (ref 0.0–0.5)
Eosinophils Relative: 5 %
HCT: 37.4 % — ABNORMAL LOW (ref 39.0–52.0)
HEMOGLOBIN: 13.3 g/dL (ref 13.0–17.0)
Immature Granulocytes: 0 %
LYMPHS ABS: 2.3 10*3/uL (ref 0.7–4.0)
LYMPHS PCT: 25 %
MCH: 29 pg (ref 26.0–34.0)
MCHC: 35.6 g/dL (ref 30.0–36.0)
MCV: 81.5 fL (ref 80.0–100.0)
MONO ABS: 0.8 10*3/uL (ref 0.1–1.0)
MONOS PCT: 9 %
NEUTROS ABS: 5.6 10*3/uL (ref 1.7–7.7)
Neutrophils Relative %: 60 %
Platelets: 226 10*3/uL (ref 150–400)
RBC: 4.59 MIL/uL (ref 4.22–5.81)
RDW: 12.1 % (ref 11.5–15.5)
WBC: 9.3 10*3/uL (ref 4.0–10.5)
nRBC: 0 % (ref 0.0–0.2)

## 2018-10-01 MED ORDER — DEXAMETHASONE 4 MG PO TABS
10.0000 mg | ORAL_TABLET | Freq: Once | ORAL | Status: AC
  Administered 2018-10-01: 10 mg via ORAL
  Filled 2018-10-01: qty 2.5

## 2018-10-01 MED ORDER — LEVOFLOXACIN 500 MG PO TABS: 500.0000 mg | ORAL_TABLET | Freq: Every day | ORAL | 0 refills | Status: AC

## 2018-10-01 MED ORDER — LEVOFLOXACIN IN D5W 750 MG/150ML IV SOLN
750.0000 mg | Freq: Once | INTRAVENOUS | Status: AC
  Administered 2018-10-01: 750 mg via INTRAVENOUS
  Filled 2018-10-01: qty 150

## 2018-10-01 MED ORDER — AMOXICILLIN 500 MG PO CAPS
500.0000 mg | ORAL_CAPSULE | Freq: Once | ORAL | Status: DC
  Filled 2018-10-01: qty 1

## 2018-10-01 MED ORDER — DOXYCYCLINE HYCLATE 100 MG PO TABS: 100.0000 mg | ORAL_TABLET | Freq: Once | ORAL | Status: DC

## 2018-10-01 NOTE — ED Notes (Signed)
Pharmacy call to request Decadron

## 2018-10-01 NOTE — ED Notes (Addendum)
Patient ambulated while monitoring pulse oximetry per MD order. Patient's heart rate initially 88 BPM; Patient's oxygen saturation level initially 96%. Patient's heart rate increased to 98 BPM; Patient's oxygen saturation ranged from 89-91% on RA. MD informed. Patient had increased respiratory rate and work of breathing.

## 2018-10-01 NOTE — Discharge Instructions (Signed)
Please follow up with PCP.  Return for worsening symptoms including shortness of breath, fevers, decreased appetite or for any additional questions or concerns.

## 2018-10-01 NOTE — ED Provider Notes (Signed)
[facility] Emergency Department Provider Note    First MD Initiated Contact with Patient 10/01/18 0118     (approximate)  I have reviewed the triage vital signs and the nursing notes.   HISTORY  Chief Complaint Sore Throat    HPI Derrick Sheppard is a 62 y.o. male presents the ER for evaluation of sore throat for the past 3 days.  States it is scratchy and aching in nature.  States his been taking Advil with some improvement.  He is able to swallow.  Has been having thick cough.  No measured fevers at home.  States he took amoxicillin today and does feel some improvement.  States he is also having a lot of upper nasal congestion as well.    Past Medical History:  Diagnosis Date  . Allergic state   . Arthritis   . Asthma   . B12 deficiency   . COPD (chronic obstructive pulmonary disease) ([place])   . Coronary artery disease   . Dysrhythmia   . Edema   . GERD (gastroesophageal reflux disease)   . Headache   . Palpitations   . Pulmonary nodules    Family History  Problem Relation Age of Onset  . Prostate cancer Neg Hx   . Bladder Cancer Neg Hx   . Kidney cancer Neg Hx    Past Surgical History:  Procedure Laterality Date  . COLONOSCOPY    . COLONOSCOPY WITH PROPOFOL N/A 04/09/2016   Procedure: COLONOSCOPY WITH PROPOFOL;  Surgeon: Lollie Sails, MD;  Location: [facility] ENDOSCOPY;  Service: Endoscopy;  Laterality: N/A;   Patient Active Problem List   Diagnosis Date Noted  . Elevated PSA 07/12/2018  . Chest trauma   . Pneumothorax   . Chronic obstructive pulmonary disease ([place])   . Tobacco abuse   . Pain   . Acute blood loss anemia   . Tachypnea   . Urinary retention   . Community acquired pneumonia   . Accidental fall from ladder   . Lung nodules   . Closed fracture of one rib of right side   . Closed fracture of four ribs of right side   . Pneumothorax, traumatic   . Respiratory distress   . Multiple rib fractures 08/30/2017       Prior to Admission medications   Medication Sig Start Date End Date Taking? Authorizing Provider  albuterol (PROVENTIL HFA;VENTOLIN HFA) 108 (90 Base) MCG/ACT inhaler Inhale 2 puffs into the lungs every 6 (six) hours as needed for wheezing or shortness of breath.    [provider]  aspirin EC 81 MG tablet Take 81 mg by mouth daily.    [provider]  B Complex-C (B-COMPLEX WITH VITAMIN C) tablet Take 1 tablet by mouth daily.    [provider]  Cholecalciferol 1000 units TBDP Take 1,000 Units/day by mouth daily.    [provider]  esomeprazole (NEXIUM) 40 MG capsule Take 40 mg by mouth daily at 12 noon.    [provider]  Fluticasone-Umeclidin-Vilant (TRELEGY ELLIPTA) 100-62.5-25 MCG/INH AEPB Inhale into the lungs.    [provider]  ibuprofen (ADVIL,MOTRIN) 200 MG tablet Take 600 mg by mouth every 4 (four) hours as needed.     [provider]  levofloxacin (LEVAQUIN) 500 MG tablet Take 1 tablet (500 mg total) by mouth daily for 10 days. 10/01/18 10/11/18  Merlyn Lot, MD  losartan (COZAAR) 50 MG tablet Take 50 mg by mouth daily.  [provider]    Allergies Amlodipine and Codeine    Social History Social History   Tobacco Use  . Smoking status: Former Smoker    Packs/day: 1.50    Years: 46.00    Pack years: 69.00    Types: Cigarettes    Last attempt to quit: 09/01/2017    Years since quitting: 1.0  . Smokeless tobacco: Never Used  Substance Use Topics  . Alcohol use: No  . Drug use: No    Review of Systems Patient denies headaches, rhinorrhea, blurry vision, numbness, shortness of breath, chest pain, edema, cough, abdominal pain, nausea, vomiting, diarrhea, dysuria, fevers, rashes or hallucinations unless otherwise stated above in HPI. ____________________________________________   PHYSICAL EXAM:  VITAL SIGNS: Vitals:   10/01/18 0430 10/01/18 0500  BP: (!) 150/79 (!) 154/91   Pulse: 65 69  Resp:  15  Temp:  98.2 F (36.8 C)  SpO2: 92% 94%    Constitutional: Alert and oriented.  Eyes: Conjunctivae are normal.  Head: Atraumatic. Nose: No congestion/rhinnorhea. Mouth/Throat: Mucous membranes are moist.  No evidence of PTA.  Uvula is midline.  There is erythema in the posterior pharynx.  Normal phonation. Neck: No stridor. Painless ROM.  Carotid bruits.  There is tender lymphadenopathy in the left anterior cervical chain. Cardiovascular: Normal rate, regular rhythm. Grossly normal heart sounds.  Good peripheral circulation. Respiratory: Normal respiratory effort.  No retractions. Lungs CTAB. Gastrointestinal: Soft and nontender. No distention. No abdominal bruits. No CVA tenderness. Genitourinary:  Musculoskeletal: No lower extremity tenderness nor edema.  No joint effusions. Neurologic:  Normal speech and language. No gross focal neurologic deficits are appreciated. No facial droop Skin:  Skin is warm, dry and intact. No rash noted. Psychiatric: Mood and affect are normal. Speech and behavior are normal.  ____________________________________________   LABS (all labs ordered are listed, but only abnormal results are displayed)  Results for orders placed or performed during the hospital encounter of 10/01/18 (from the past 24 hour(s))  Group A Strep by PCR     Status: None   Collection Time: 09/30/18 11:58 PM  Result Value Ref Range   Group A Strep by PCR NOT DETECTED NOT DETECTED  Blood culture (routine x 2)     Status: None (Preliminary result)   Collection Time: 10/01/18  2:58 AM  Result Value Ref Range   Specimen Description BLOOD LEFT FOREARM    Special Requests      BOTTLES DRAWN AEROBIC AND ANAEROBIC Blood Culture results may not be optimal due to an excessive volume of blood received in culture bottles   Culture      NO GROWTH < 12 HOURS Performed at [facility], [address] [place], [place] [address]    Report Status PENDING    CBC with Differential/Platelet     Status: Abnormal   Collection Time: 10/01/18  3:15 AM  Result Value Ref Range   WBC 9.3 4.0 - 10.5 K/uL   RBC 4.59 4.22 - 5.81 MIL/uL   Hemoglobin 13.3 13.0 - 17.0 g/dL   HCT 37.4 (L) 39.0 - 52.0 %   MCV 81.5 80.0 - 100.0 fL   MCH 29.0 26.0 - 34.0 pg   MCHC 35.6 30.0 - 36.0 g/dL   RDW 12.1 11.5 - 15.5 %   Platelets 226 150 - 400 K/uL   nRBC 0.0 0.0 - 0.2 %   Neutrophils Relative % 60 %   Neutro Abs 5.6 1.7 - 7.7 K/uL   Lymphocytes Relative  25 %   Lymphs Abs 2.3 0.7 - 4.0 K/uL   Monocytes Relative 9 %   Monocytes Absolute 0.8 0.1 - 1.0 K/uL   Eosinophils Relative 5 %   Eosinophils Absolute 0.5 0.0 - 0.5 K/uL   Basophils Relative 1 %   Basophils Absolute 0.1 0.0 - 0.1 K/uL   Immature Granulocytes 0 %   Abs Immature Granulocytes 0.04 0.00 - 0.07 K/uL  Basic metabolic panel     Status: Abnormal   Collection Time: 10/01/18  3:15 AM  Result Value Ref Range   Sodium 134 (L) 135 - 145 mmol/L   Potassium 3.3 (L) 3.5 - 5.1 mmol/L   Chloride 101 98 - 111 mmol/L   CO2 23 22 - 32 mmol/L   Glucose, Bld 110 (H) 70 - 99 mg/dL   BUN 11 8 - 23 mg/dL   Creatinine, Ser 0.89 0.61 - 1.24 mg/dL   Calcium 8.6 (L) 8.9 - 10.3 mg/dL   GFR calc non Af Amer >60 >60 mL/min   GFR calc Af Amer >60 >60 mL/min   Anion gap 10 5 - 15  Blood culture (routine x 2)     Status: None (Preliminary result)   Collection Time: 10/01/18  3:15 AM  Result Value Ref Range   Specimen Description BLOOD RIGHT ANTECUBITAL    Special Requests      BOTTLES DRAWN AEROBIC AND ANAEROBIC Blood Culture results may not be optimal due to an excessive volume of blood received in culture bottles   Culture      NO GROWTH < 12 HOURS Performed at [facility], [address] [place], [place] [address]    Report Status PENDING    ____________________________________________ ____________________________________________  RADIOLOGY  I personally reviewed all radiographic images  ordered to evaluate for the above acute complaints and reviewed radiology reports and findings.  These findings were personally discussed with the patient.  Please see medical record for radiology report.  ____________________________________________   PROCEDURES  Procedure(s) performed:  Procedures    Critical Care performed: no ____________________________________________   INITIAL IMPRESSION / ASSESSMENT AND PLAN / ED COURSE  Pertinent labs & imaging results that were available during my care of the patient were reviewed by me and considered in my medical decision making (see chart for details).   DDX: pharyngitis, adenitis, uri, flum, strep  Latwan Luchsinger is a 62 y.o. who presents to the ED with symptoms as described above.  Patient nontoxic-appearing.  Does have erythematous pharynx but is also been having cough.  Will order chest x-ray to evaluate for consolidation or mass.  Does not have any bruits on exam.  Is not consistent with aneurysm or dissection.  No evidence of strep.  Clinical Course as of Oct 02 723  Nancy Fetter Oct 01, 2018  0306 X-ray does show evidence of possible pneumonia.  Will check blood work and give dose of Levaquin.   [PR]  0423 Patient able to ambulate without any hypoxia.   [PR]  2952 Letter is reassuring.  Patient low risk by curb 65 and port score.  At this point believe he stable and appropriate for trial of outpatient management.  Discussed signs and symptoms for which the patient should return immediately to the hospital.   [PR]    Clinical Course User Index [PR] Merlyn Lot, MD     As part of my medical decision making, I reviewed the following data within the [facility] notes reviewed and incorporated, Labs  reviewed, notes from prior ED visits.   ____________________________________________   FINAL CLINICAL IMPRESSION(S) / ED DIAGNOSES  Final diagnoses:  Cough  Community acquired pneumonia, unspecified  laterality      NEW MEDICATIONS STARTED DURING THIS VISIT:  Discharge Medication List as of 10/01/2018  4:55 AM    START taking these medications   Details  levofloxacin (LEVAQUIN) 500 MG tablet Take 1 tablet (500 mg total) by mouth daily for 10 days., Starting Sun 10/01/2018, Until Wed 10/11/2018, Normal         Note:  This document was prepared using Dragon voice recognition software and may include unintentional dictation errors.    Merlyn Lot, MD 10/01/18 [phone]

## 2018-10-01 NOTE — ED Notes (Signed)
ED provider at bedside. Medication restarted per MD order. RN will continue to monitor.

## 2018-10-01 NOTE — ED Notes (Signed)
Reviewed discharge instructions, follow-up care, and prescriptions with patient. Patient verbalized understanding of all information reviewed. Patient stable, with no distress noted at this time.    

## 2018-10-01 NOTE — ED Notes (Addendum)
Beverages provided. Family at bedside.

## 2018-10-01 NOTE — ED Notes (Signed)
Patient reports that he took 2 amoxicillin tablets yesterday, last dose at 2100, left over from previous prescription.  MD informed. Medication not administered.

## 2018-10-01 NOTE — ED Notes (Signed)
Patient c/o itching to left arm. Medication paused. MD informed. MD ordered to watch patient for 5-10 minutes and restart medication if symptoms resolve/no further complaints. RN will continue to monitor.

## 2018-10-06 LAB — CULTURE, BLOOD (ROUTINE X 2)
Culture: NO GROWTH
Culture: NO GROWTH

## 2018-12-16 IMAGING — DX DG CHEST 1V PORT
1 series · 1 of 1 positions shown · non-contrast
Comparison: 08/31/2017

CLINICAL DATA: Respiratory distress. New fever. Diagnosed with
small right apical pneumothorax this morning. Multiple right rib
fractures. Basilar atelectasis and small pleural effusion.

EXAM:
PORTABLE CHEST 1 VIEW

[chest ap]
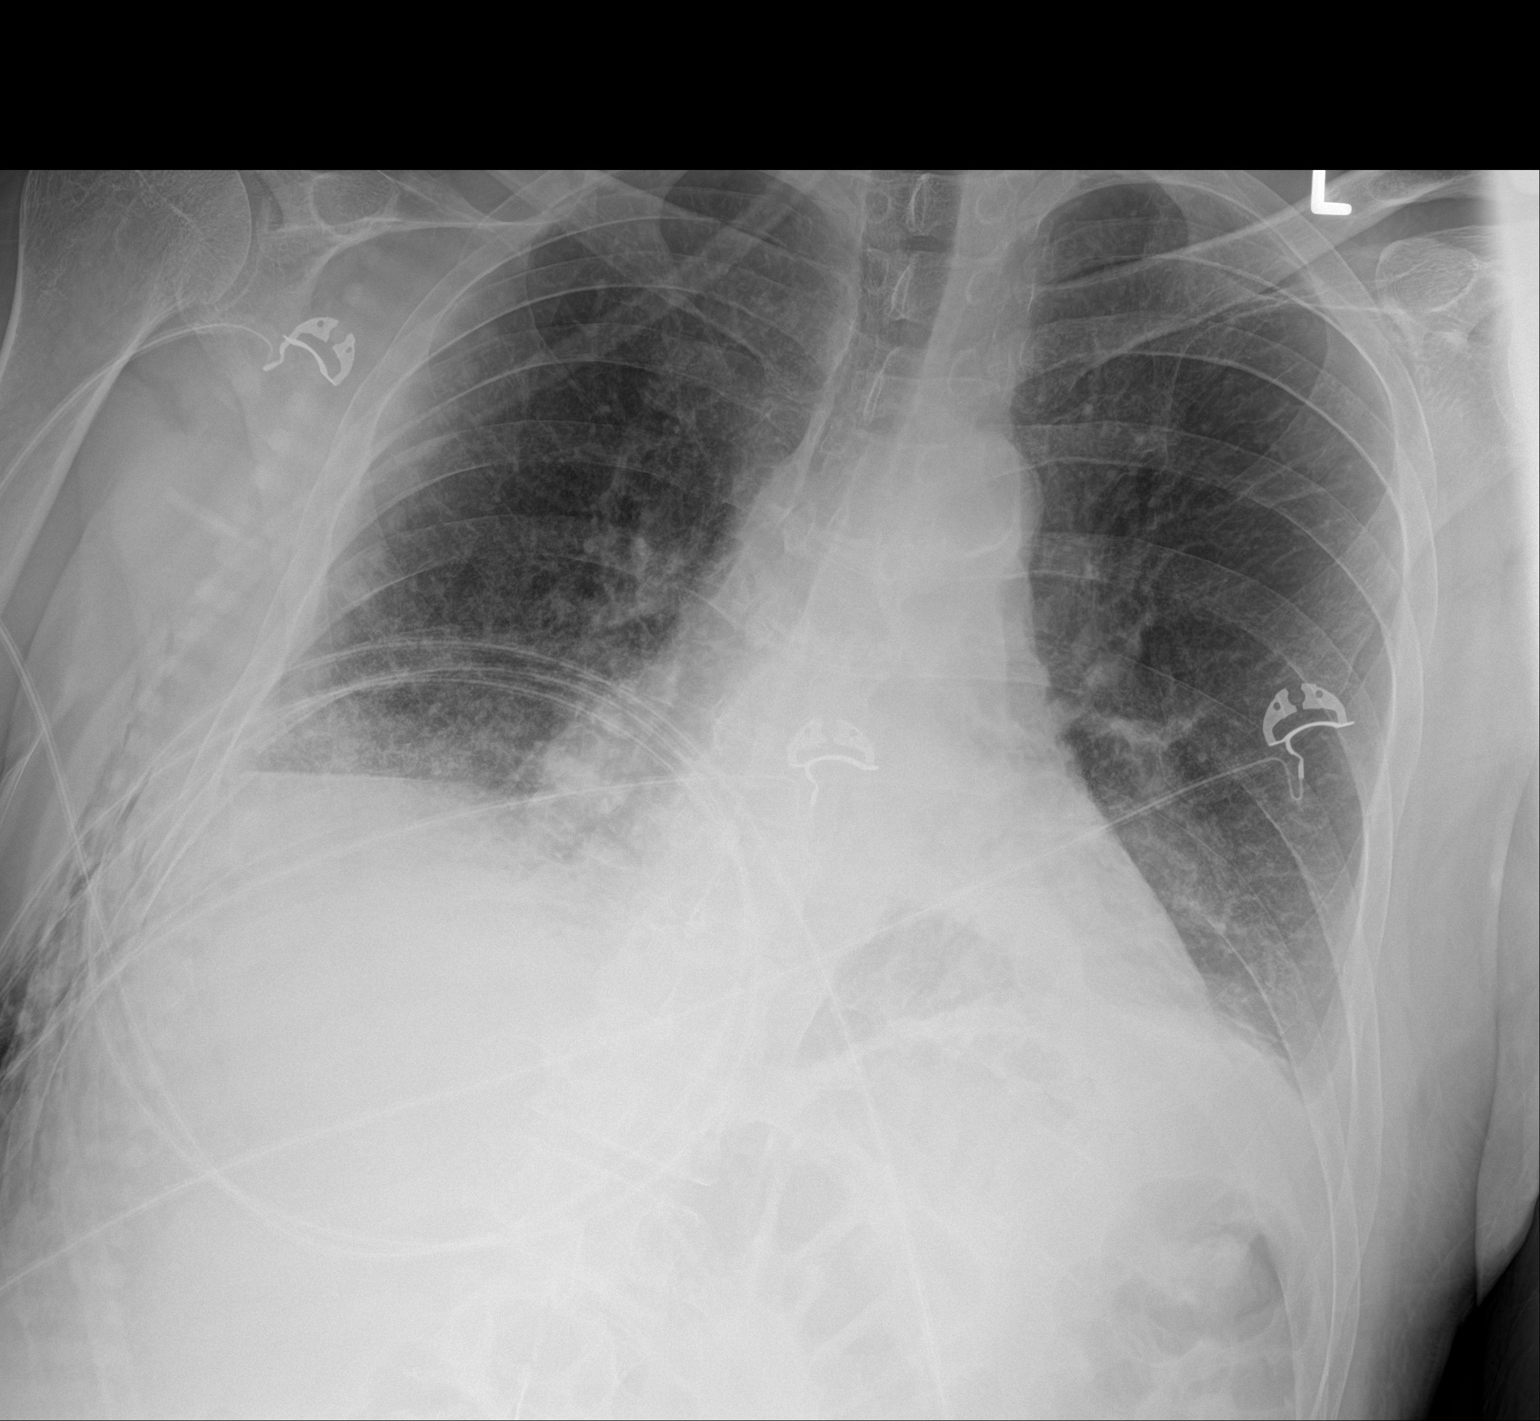

[1 of 1 positions shown; findings below may reference images not displayed]

FINDINGS: Shallow inspiration with linear atelectasis in the lung bases.
Cardiac enlargement without vascular congestion. No edema. Fluid or
thickened pleura in the right costophrenic angle. The right
pneumothorax seen previously is not demonstrated on the current
examination. Subcutaneous emphysema in the right lateral chest wall.
Right rib fractures. Calcification of the aorta.
IMPRESSION: Shallow inspiration with atelectasis in the lung bases. Fluid or
thickened pleura in the right costophrenic angle. Pneumothorax seen
previously is not well demonstrated, likely due to technique.
Subcutaneous emphysema in the right chest wall. Right rib fractures.
Aortic atherosclerosis. No significant progression since the
previous study.

## 2018-12-17 ENCOUNTER — Emergency Department: Payer: BLUE CROSS/BLUE SHIELD

## 2018-12-17 ENCOUNTER — Other Ambulatory Visit: Payer: Self-pay

## 2018-12-17 ENCOUNTER — Observation Stay
Admission: EM | Admit: 2018-12-17 | Discharge: 2018-12-18 | Disposition: A | Discharge status: Home / Self Care | Payer: BLUE CROSS/BLUE SHIELD | Attending: Specialist | Admitting: Specialist

## 2018-12-17 DIAGNOSIS — I10 Essential (primary) hypertension: Secondary | ICD-10-CM | POA: Diagnosis not present

## 2018-12-17 DIAGNOSIS — E876 Hypokalemia: Secondary | ICD-10-CM | POA: Insufficient documentation

## 2018-12-17 DIAGNOSIS — Z7982 Long term (current) use of aspirin: Secondary | ICD-10-CM | POA: Insufficient documentation

## 2018-12-17 DIAGNOSIS — K219 Gastro-esophageal reflux disease without esophagitis: Secondary | ICD-10-CM | POA: Diagnosis not present

## 2018-12-17 DIAGNOSIS — G43909 Migraine, unspecified, not intractable, without status migrainosus: Secondary | ICD-10-CM | POA: Insufficient documentation

## 2018-12-17 DIAGNOSIS — I639 Cerebral infarction, unspecified: Secondary | ICD-10-CM | POA: Insufficient documentation

## 2018-12-17 DIAGNOSIS — Z79899 Other long term (current) drug therapy: Secondary | ICD-10-CM | POA: Insufficient documentation

## 2018-12-17 DIAGNOSIS — M1990 Unspecified osteoarthritis, unspecified site: Secondary | ICD-10-CM | POA: Diagnosis not present

## 2018-12-17 DIAGNOSIS — I2510 Atherosclerotic heart disease of native coronary artery without angina pectoris: Secondary | ICD-10-CM | POA: Insufficient documentation

## 2018-12-17 DIAGNOSIS — Z87891 Personal history of nicotine dependence: Secondary | ICD-10-CM | POA: Diagnosis not present

## 2018-12-17 DIAGNOSIS — J449 Chronic obstructive pulmonary disease, unspecified: Secondary | ICD-10-CM | POA: Insufficient documentation

## 2018-12-17 DIAGNOSIS — E538 Deficiency of other specified B group vitamins: Secondary | ICD-10-CM | POA: Insufficient documentation

## 2018-12-17 DIAGNOSIS — Z791 Long term (current) use of non-steroidal anti-inflammatories (NSAID): Secondary | ICD-10-CM | POA: Diagnosis not present

## 2018-12-17 DIAGNOSIS — I674 Hypertensive encephalopathy: Principal | ICD-10-CM | POA: Insufficient documentation

## 2018-12-17 LAB — URINALYSIS, COMPLETE (UACMP) WITH MICROSCOPIC
BILIRUBIN URINE: NEGATIVE
Bacteria, UA: NONE SEEN
Glucose, UA: NEGATIVE mg/dL
Hgb urine dipstick: NEGATIVE
Ketones, ur: NEGATIVE mg/dL
Leukocytes,Ua: NEGATIVE
Nitrite: NEGATIVE
Protein, ur: NEGATIVE mg/dL
Specific Gravity, Urine: 1.01 (ref 1.005–1.030)
Squamous Epithelial / HPF: NONE SEEN (ref 0–5)
pH: 6 (ref 5.0–8.0)

## 2018-12-17 LAB — CBC
HCT: 39.9 % (ref 39.0–52.0)
Hemoglobin: 14.2 g/dL (ref 13.0–17.0)
MCH: 28.9 pg (ref 26.0–34.0)
MCHC: 35.6 g/dL (ref 30.0–36.0)
MCV: 81.3 fL (ref 80.0–100.0)
NRBC: 0 % (ref 0.0–0.2)
Platelets: 241 10*3/uL (ref 150–400)
RBC: 4.91 MIL/uL (ref 4.22–5.81)
RDW: 12.3 % (ref 11.5–15.5)
WBC: 7.9 10*3/uL (ref 4.0–10.5)

## 2018-12-17 LAB — DIFFERENTIAL
Abs Immature Granulocytes: 0.02 10*3/uL (ref 0.00–0.07)
Basophils Absolute: 0.1 10*3/uL (ref 0.0–0.1)
Basophils Relative: 1 %
EOS PCT: 5 %
Eosinophils Absolute: 0.4 10*3/uL (ref 0.0–0.5)
Immature Granulocytes: 0 %
Lymphocytes Relative: 29 %
Lymphs Abs: 2.3 10*3/uL (ref 0.7–4.0)
Monocytes Absolute: 0.6 10*3/uL (ref 0.1–1.0)
Monocytes Relative: 7 %
Neutro Abs: 4.6 10*3/uL (ref 1.7–7.7)
Neutrophils Relative %: 58 %

## 2018-12-17 LAB — URINE DRUG SCREEN, QUALITATIVE (ARMC ONLY)
Amphetamines, Ur Screen: NOT DETECTED
BARBITURATES, UR SCREEN: NOT DETECTED
Benzodiazepine, Ur Scrn: NOT DETECTED
COCAINE METABOLITE, UR ~~LOC~~: NOT DETECTED
Cannabinoid 50 Ng, Ur ~~LOC~~: NOT DETECTED
MDMA (Ecstasy)Ur Screen: NOT DETECTED
Methadone Scn, Ur: NOT DETECTED
Opiate, Ur Screen: NOT DETECTED
Phencyclidine (PCP) Ur S: NOT DETECTED
Tricyclic, Ur Screen: NOT DETECTED

## 2018-12-17 LAB — COMPREHENSIVE METABOLIC PANEL
ALT: 15 U/L (ref 0–44)
ANION GAP: 8 (ref 5–15)
AST: 18 U/L (ref 15–41)
Albumin: 4.1 g/dL (ref 3.5–5.0)
Alkaline Phosphatase: 153 U/L — ABNORMAL HIGH (ref 38–126)
BUN: 10 mg/dL (ref 8–23)
CO2: 25 mmol/L (ref 22–32)
Calcium: 8.5 mg/dL — ABNORMAL LOW (ref 8.9–10.3)
Chloride: 107 mmol/L (ref 98–111)
Creatinine, Ser: 1.15 mg/dL (ref 0.61–1.24)
GFR calc Af Amer: >60 mL/min (ref >60)
GFR calc non Af Amer: >60 mL/min (ref >60)
GLUCOSE: 107 mg/dL — AB (ref 70–99)
Potassium: 3.2 mmol/L — ABNORMAL LOW (ref 3.5–5.1)
Sodium: 140 mmol/L (ref 135–145)
Total Bilirubin: 1.1 mg/dL (ref 0.3–1.2)
Total Protein: 7.3 g/dL (ref 6.5–8.1)

## 2018-12-17 LAB — GLUCOSE, CAPILLARY: GLUCOSE-CAPILLARY: 102 mg/dL — AB (ref 70–99)

## 2018-12-17 LAB — APTT: aPTT: 30 seconds (ref 24–36)

## 2018-12-17 LAB — ETHANOL: Alcohol, Ethyl (B): <10 mg/dL (ref <10)

## 2018-12-17 LAB — PROTIME-INR
INR: 1 (ref 0.8–1.2)
Prothrombin Time: 13.4 seconds (ref 11.4–15.2)

## 2018-12-17 MED ORDER — ASPIRIN 81 MG PO CHEW
324.0000 mg | CHEWABLE_TABLET | Freq: Once | ORAL | Status: AC
  Administered 2018-12-18: 324 mg via ORAL
  Filled 2018-12-17: qty 4

## 2018-12-17 NOTE — ED Notes (Signed)
Code stroke called from triage

## 2018-12-17 NOTE — ED Triage Notes (Signed)
Patient last known well at 7pm. Had just left church and started to become confused. Didn't know anybody, sentences not making any sense, unbalanced and has a headache.

## 2018-12-17 NOTE — ED Provider Notes (Signed)
[facility] Emergency Department Provider Note  ____________________________________________  Time seen: Approximately 10:20 PM  I have reviewed the triage vital signs and the nursing notes.   HISTORY  Chief Complaint Aphasia    HPI Derrick Sheppard is a 63 y.o. male with a history of GERD, CAD, COPD, migraines is brought to the ED due to confusion and aphasia that started suddenly at 7 PM.  Wife states that he could not recognize her, was speaking incoherently and garbled, was on balance, had a left-sided headache, and did not know the date or who the current president is.  She states that he has had episodes like this in the past related to migraine headaches but had negative stroke work-ups, but today's episode seems to be more prolonged and so she came to the ED for evaluation.  Symptoms have been constant since onset, severe, no aggravating or alleviating factors.      Past Medical History:  Diagnosis Date  . Allergic state   . Arthritis   . Asthma   . B12 deficiency   . COPD (chronic obstructive pulmonary disease) ([place])   . Coronary artery disease   . Dysrhythmia   . Edema   . GERD (gastroesophageal reflux disease)   . Headache   . Palpitations   . Pulmonary nodules      Patient Active Problem List   Diagnosis Date Noted  . Elevated PSA 07/12/2018  . Chest trauma   . Pneumothorax   . Chronic obstructive pulmonary disease ([place])   . Tobacco abuse   . Pain   . Acute blood loss anemia   . Tachypnea   . Urinary retention   . Community acquired pneumonia   . Accidental fall from ladder   . Lung nodules   . Closed fracture of one rib of right side   . Closed fracture of four ribs of right side   . Pneumothorax, traumatic   . Respiratory distress   . Multiple rib fractures 08/30/2017     Past Surgical History:  Procedure Laterality Date  . COLONOSCOPY    . COLONOSCOPY WITH PROPOFOL N/A 04/09/2016   Procedure: COLONOSCOPY WITH  PROPOFOL;  Surgeon: Lollie Sails, MD;  Location: [facility] ENDOSCOPY;  Service: Endoscopy;  Laterality: N/A;     Prior to Admission medications   Medication Sig Start Date End Date Taking? Authorizing Provider  albuterol (PROVENTIL HFA;VENTOLIN HFA) 108 (90 Base) MCG/ACT inhaler Inhale 2 puffs into the lungs every 6 (six) hours as needed for wheezing or shortness of breath.    [provider]  aspirin EC 81 MG tablet Take 81 mg by mouth daily.    [provider]  B Complex-C (B-COMPLEX WITH VITAMIN C) tablet Take 1 tablet by mouth daily.    [provider]  Cholecalciferol 1000 units TBDP Take 1,000 Units/day by mouth daily.    [provider]  esomeprazole (NEXIUM) 40 MG capsule Take 40 mg by mouth daily at 12 noon.    [provider]  Fluticasone-Umeclidin-Vilant (TRELEGY ELLIPTA) 100-62.5-25 MCG/INH AEPB Inhale into the lungs.    [provider]  ibuprofen (ADVIL,MOTRIN) 200 MG tablet Take 600 mg by mouth every 4 (four) hours as needed.     [provider]  losartan (COZAAR) 50 MG tablet Take 50 mg by mouth daily.    [provider]     Allergies Amlodipine and Codeine   Family History  Problem Relation Age of Onset  . Prostate cancer  Neg Hx   . Bladder Cancer Neg Hx   . Kidney cancer Neg Hx     Social History Social History   Tobacco Use  . Smoking status: Former Smoker    Packs/day: 1.50    Years: 46.00    Pack years: 69.00    Types: Cigarettes    Last attempt to quit: 09/01/2017    Years since quitting: 1.2  . Smokeless tobacco: Never Used  Substance Use Topics  . Alcohol use: No  . Drug use: No    Review of Systems  Constitutional:   No fever or chills.  ENT:   No sore throat. No rhinorrhea. Cardiovascular:   No chest pain or syncope. Respiratory:   No dyspnea or cough. Gastrointestinal:   Negative for abdominal pain, vomiting and diarrhea.  Musculoskeletal:   Negative for focal pain or  swelling All other systems reviewed and are negative except as documented above in ROS and HPI.  ____________________________________________   PHYSICAL EXAM:  VITAL SIGNS: ED Triage Vitals  Enc Vitals Group     BP 12/17/18 2054 (!) 178/99     Pulse Rate 12/17/18 2054 64     Resp 12/17/18 2054 15     Temp 12/17/18 2054 97.6 F (36.4 C)     Temp Source 12/17/18 2054 Oral     SpO2 12/17/18 2054 96 %     Weight 12/17/18 2107 185 lb (83.9 kg)     Height 12/17/18 2107 5\' 6"  (1.676 m)     Head Circumference --      Peak Flow --      Pain Score 12/17/18 2107 0     Pain Loc --      Pain Edu? --      Excl. in [place]? --     Vital signs reviewed, nursing assessments reviewed.   Constitutional:   Alert and oriented. Non-toxic appearance. Eyes:   Conjunctivae are normal. EOMI. PERRL. ENT      Head:   Normocephalic and atraumatic.      Nose:   No congestion/rhinnorhea.       Mouth/Throat:   MMM, no pharyngeal erythema. No peritonsillar mass.       Neck:   No meningismus. Full ROM. Hematological/Lymphatic/Immunilogical:   No cervical lymphadenopathy. Cardiovascular:   RRR. Symmetric bilateral radial and DP pulses.  No murmurs. Cap refill less than 2 seconds. Respiratory:   Normal respiratory effort without tachypnea/retractions. Breath sounds are clear and equal bilaterally. No wheezes/rales/rhonchi. Gastrointestinal:   Soft and nontender. Non distended. There is no CVA tenderness.  No rebound, rigidity, or guarding.  Musculoskeletal:   Normal range of motion in all extremities. No joint effusions.  No lower extremity tenderness.  No edema. Neurologic:   Dysarthria, mild aphasia.  Motor grossly intact. NIH stroke scale 2 Skin:    Skin is warm, dry and intact. No rash noted.  No petechiae, purpura, or bullae.  ____________________________________________    LABS (pertinent positives/negatives) (all labs ordered are listed, but only abnormal results are displayed) Labs Reviewed   GLUCOSE, CAPILLARY - Abnormal; Notable for the following components:      Result Value   Glucose-Capillary 102 (*)    All other components within normal limits  COMPREHENSIVE METABOLIC PANEL - Abnormal; Notable for the following components:   Potassium 3.2 (*)    Glucose, Bld 107 (*)    Calcium 8.5 (*)    Alkaline Phosphatase 153 (*)    All other components within normal limits  URINALYSIS, COMPLETE (UACMP) WITH MICROSCOPIC - Abnormal; Notable for the following components:   Color, Urine YELLOW (*)    APPearance CLEAR (*)    All other components within normal limits  ETHANOL  PROTIME-INR  APTT  CBC  DIFFERENTIAL  URINE DRUG SCREEN, QUALITATIVE ([facility] ONLY)   ____________________________________________   EKG  Interpreted by me Sinus rhythm rate of 71, normal axis and intervals.  Normal QRS ST segments and T waves.  2 PVCs on the strip.  ____________________________________________    RADIOLOGY  Ct Head Code Stroke Wo Contrast  Result Date: 12/17/2018 CLINICAL DATA:  Code stroke. Focal neuro deficit for less than 6 hours. Stroke suspected. Confusion beginning 2 hours ago. EXAM: CT HEAD WITHOUT CONTRAST TECHNIQUE: Contiguous axial images were obtained from the base of the skull through the vertex without intravenous contrast. COMPARISON:  CT head without contrast 09/15/2017 FINDINGS: Brain: Moderate atrophy and white matter disease is again seen bilaterally. A lacunar infarct involving the right centrum semi ovale is new since the prior study. This does not appear acute. Basal ganglia are otherwise intact. Insular ribbon is normal. No acute or focal cortical abnormality is present. Pineal calcification is again noted. The brainstem and cerebellum are within normal limits. Vascular: Atherosclerotic calcifications are present within the cavernous internal carotid arteries. There is no hyperdense vessel. Skull: Calvarium is intact. No focal lytic or blastic lesions are present.  Sinuses/Orbits: The paranasal sinuses and mastoid air cells are clear. The globes and orbits are within normal limits. ASPECTS [facility] Stroke Program Early CT Score) - Ganglionic level infarction (caudate, lentiform nuclei, internal capsule, insula, M1-M3 cortex): 7/7 - Supraganglionic infarction (M4-M6 cortex): 3/3 Total score (0-10 with 10 being normal): 10/10 IMPRESSION: 1. No acute intracranial abnormality. 2. 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 3. Stable atrophy and diffuse white matter disease 4. ASPECTS is 10/10 These results were called by telephone at the time of interpretation on 12/17/2018 at 9:38 pm to Dr. Carrie Mew , who verbally acknowledged these results. Electronically Signed   By: San Morelle M.D.   On: 12/17/2018 21:38    ____________________________________________   PROCEDURES .Critical Care Performed by: Carrie Mew, MD Authorized by: Carrie Mew, MD   Critical care provider statement:    Critical care time (minutes):  35   Critical care time was exclusive of:  Separately billable procedures and treating other patients   Critical care was time spent personally by me on the following activities:  Development of treatment plan with patient or surrogate, discussions with consultants, evaluation of patient's response to treatment, examination of patient, obtaining history from patient or surrogate, ordering and performing treatments and interventions, ordering and review of laboratory studies, ordering and review of radiographic studies, pulse oximetry, re-evaluation of patient's condition and review of old charts    ____________________________________________  DIFFERENTIAL DIAGNOSIS   Acute ischemic stroke, hemorrhagic stroke, complicated migraine  CLINICAL IMPRESSION / ASSESSMENT AND PLAN / ED COURSE  Medications ordered in the ED: Medications  aspirin chewable tablet  324 mg (has no administration in time range)    Pertinent labs & imaging results that were available during my care of the patient were reviewed by me and considered in my medical decision making (see chart for details).      Clinical Course as of Dec 16 2356  Nancy Fetter Dec 17, 2018  2122 Presents with aphasia and confusion that started suddenly at 7 PM.  Family reports this is similar to previous episodes of what appear to be complicated migraines.  On my evaluation now his confusion and memory are improving.  Now knows date, president, wife's name.  Motor exam is nonfocal.  No neglect.  Will send for stat CT head and obtain neurology consult.   [PS]  2138 Discussed with radiology, CT head nonacute.   [PS]    Clinical Course User Index [PS] Carrie Mew, MD    ----------------------------------------- 11:57 PM on 12/17/2018 -----------------------------------------  Neurology consult note reviewed, impression of hypertensive encephalopathy and recommending hospitalization for further stroke work-up.  Aspirin ordered, case discussed with hospitalist.  At the time of neurology evaluation NIH stroke scale had improved to 0.  Not consistent with a large vessel occlusion, not a candidate for thrombolysis or endovascular intervention.   ____________________________________________   FINAL CLINICAL IMPRESSION(S) / ED DIAGNOSES    Final diagnoses:  Hypertensive encephalopathy     ED Discharge Orders    None      Portions of this note were generated with dragon dictation software. Dictation errors may occur despite best attempts at proofreading.   Carrie Mew, MD 12/17/18 2358

## 2018-12-17 NOTE — Progress Notes (Signed)
Pastoral Care Visit    12/17/18 2110  Clinical Encounter Type  Visited With Patient and family together (wife and daughter)  Visit Type Initial;Spiritual support;ED;Code  Referral From Nurse  Consult/Referral To Chaplain  Spiritual Encounters  Spiritual Needs Prayer  Stress Factors  Patient Stress Factors Health changes  Family Stress Factors Health changes;Loss of control   Pt was sitting up in bed and disoriented by the medical team and all that was going on in the room.  Pt was accompanied by wife and daughter who are concerned for his health. They shared concerns about his health since his fall 2 years ago. Also mentioned concern over possible dementia since pt's mom had alzheimer's. Chap listened to family story while pt was at CAT scan.  Chap prayed w/ fam.  Darcey Nora, Chaplain

## 2018-12-17 NOTE — ED Notes (Signed)
Called Code Stroke to X333 to Room 11.

## 2018-12-17 NOTE — Consult Note (Signed)
[facility] Tele[facility]urology Consult Services   Date of Service:   12/17/2018 21:38:15  Impression:     .  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 Symptoms improved.  Metrics: Last Known Well: 12/17/2018 19:00:00 [facility] Notification Time: 12/17/2018 21:38:15 Arrival Time: 12/17/2018 20:44:00 Stamp Time: 12/17/2018 21:38:15 Time First Login Attempt: 12/17/2018 21:43:48 Video Start Time: 12/17/2018 21:43:48  Symptoms: difficulty with the memory. NIHSS Start Assessment Time: 12/17/2018 21:46:27 Patient is not a candidate for tPA. Patient was not deemed candidate for tPA thrombolytics because of No disabling deficits. Likely hypertensive encephalopathy. Risks outweigh the benefits.. Video End Time: 12/17/2018 21:53:49  CT head was reviewed and results were: 1. No acute intracranial abnormality. 2. 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 3. Stable atrophy and diffuse white matter disease 4. ASPECTS is 10/10  Clinical Presentation is not Suggestive of Large Vessel Occlusive Disease, Patient is not a Candidate for Thrombectomy  ED Physician notified of diagnostic impression and management plan on 12/17/2018 21:53:52  Our recommendations are outlined below.  Recommendations:     .  Activate Stroke Protocol Admission/Order Set     .  Stroke/Telemetry Floor     .  Neuro Checks     .  Bedside Swallow Eval     .  DVT Prophylaxis     .  IV Fluids, Normal Saline     .  Head of Bed Below 30 Degrees     .  Euglycemia and Avoid Hyperthermia (PRN Acetaminophen)     .  Initiate Aspirin 81 MG Daily     .  Infectious/metabolic workup  Routine Consultation with [facility] for Follow up Care  Sign Out:     .  Discussed with Emergency Department  Provider    ------------------------------------------------------------------------------  History of Present Illness: Patient is a 63 year old Male.  Patient was brought by private transportation with symptoms of difficulty with the memory.  63 YO M with h/o COPD and HTN presented with confusion. He started having a headache around 19:00 and had trouble recalling his grand children's names. No focal deficits. No visual deficits, numbness or tingling.  CT head was reviewed.    Examination: BP(180/94), Blood Glucose(102) 1A: Level of Consciousness - Alert; keenly responsive + 0 1B: Ask Month and Age - Both Questions Right + 0 1C: Blink Eyes & Squeeze Hands - Performs Both Tasks + 0 2: Test Horizontal Extraocular Movements - Normal + 0 3: Test Visual Fields - No Visual Loss + 0 4: Test Facial Palsy (Use Grimace if Obtunded) - Normal symmetry + 0 5A: Test Left Arm Motor Drift - No Drift for 10 Seconds + 0 5B: Test Right Arm Motor Drift - No Drift for 10 Seconds + 0 6A: Test Left Leg Motor Drift - No Drift for 5 Seconds + 0 6B: Test Right Leg Motor Drift - No Drift for 5 Seconds + 0 7: Test Limb Ataxia (FNF/Heel-Shin) - No Ataxia + 0 8: Test Sensation - Normal; No sensory loss + 0 9: Test Language/Aphasia - Normal; No aphasia + 0 10: Test Dysarthria - Normal + 0 11: Test Extinction/Inattention - No abnormality + 0  NIHSS Score: 0  Patient was informed the Neurology Consult would happen via TeleHealth consult by way of interactive audio and video telecommunications and consented to receiving  care in this manner.  Due to the immediate potential for life-threatening deterioration due to underlying acute neurologic illness, I spent 35 minutes providing critical care. This time includes time for face to face visit via telemedicine, review of medical records, imaging studies and discussion of findings with providers, the patient and/or family.   Dr Beau Fanny   [facility] [phone]  Case [id number]

## 2018-12-18 ENCOUNTER — Observation Stay
Admit: 2018-12-18 | Discharge: 2018-12-18 | Disposition: A | Discharge status: Home / Self Care | Payer: BLUE CROSS/BLUE SHIELD | Attending: Internal Medicine | Admitting: Internal Medicine

## 2018-12-18 ENCOUNTER — Emergency Department: Payer: BLUE CROSS/BLUE SHIELD

## 2018-12-18 ENCOUNTER — Observation Stay: Payer: BLUE CROSS/BLUE SHIELD

## 2018-12-18 ENCOUNTER — Other Ambulatory Visit: Payer: Self-pay

## 2018-12-18 DIAGNOSIS — I639 Cerebral infarction, unspecified: Secondary | ICD-10-CM

## 2018-12-18 LAB — HEMOGLOBIN A1C
Hgb A1c MFr Bld: 5 % (ref 4.8–5.6)
Mean Plasma Glucose: 96.8 mg/dL

## 2018-12-18 LAB — ECHOCARDIOGRAM COMPLETE
Height: 66 in
Weight: 2960 oz

## 2018-12-18 LAB — LIPID PANEL
Cholesterol: 139 mg/dL (ref 0–200)
HDL: 36 mg/dL — ABNORMAL LOW (ref >40)
LDL Cholesterol: 87 mg/dL (ref 0–99)
Total CHOL/HDL Ratio: 3.9 RATIO
Triglycerides: 78 mg/dL (ref <150)
VLDL: 16 mg/dL (ref 0–40)

## 2018-12-18 LAB — TSH: TSH: 1.911 u[IU]/mL (ref 0.350–4.500)

## 2018-12-18 IMAGING — CT CT CHEST W/O CM
2 of 4 series · 15 of 36 positions shown, 18 images · non-contrast
Comparison: 08/30/2017

CLINICAL DATA: Fell from ladder 3 days ago. Multiple right rib
fractures with small pneumothorax and pulmonary contusion.
Increasing subcutaneous air in pneumothorax on the right side.
Fever.

EXAM:
CT CHEST WITHOUT CONTRAST
TECHNIQUE: Multidetector CT imaging of the chest was performed following the
standard protocol without IV contrast.

[Series 3: chest wo · axial · 0.73mm/px · z∈[+1033,+1341]mm · 12 of 183 slices shown, 15 images]
[im 15/183  mediastinal]
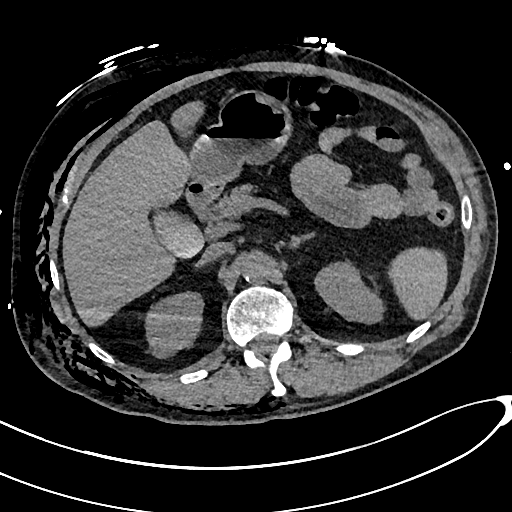
[im 15/183  lung]
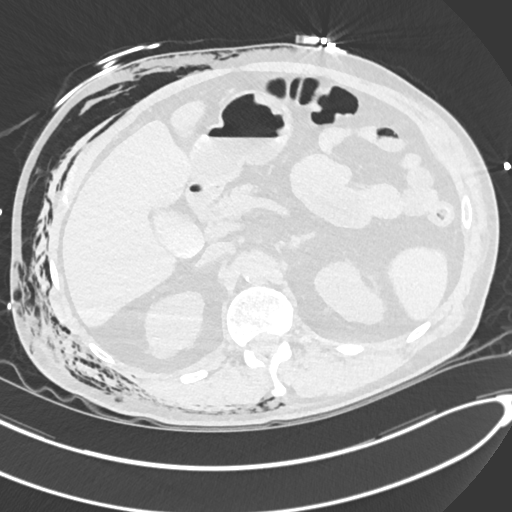
[im 29/183  lung]
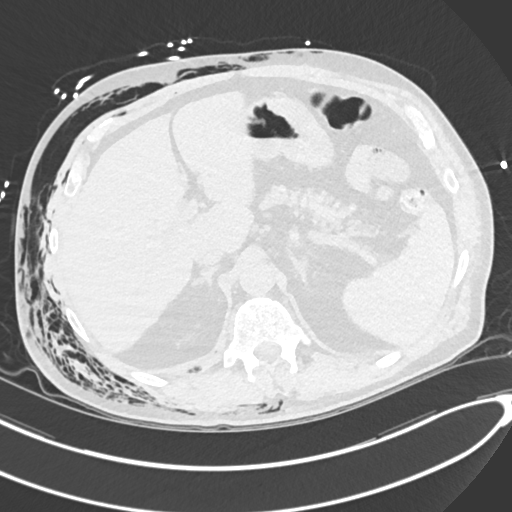
[im 43/183  lung]
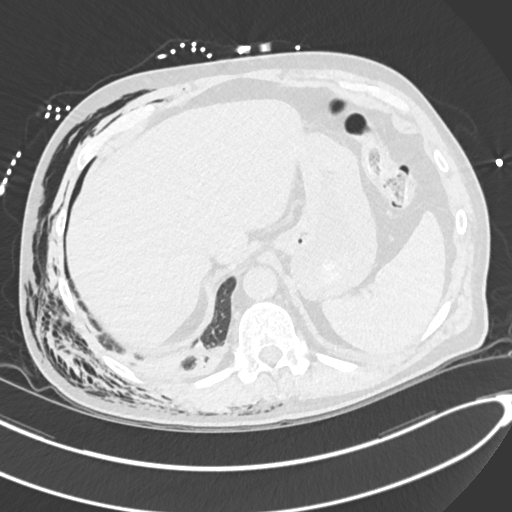
[im 57/183  lung]
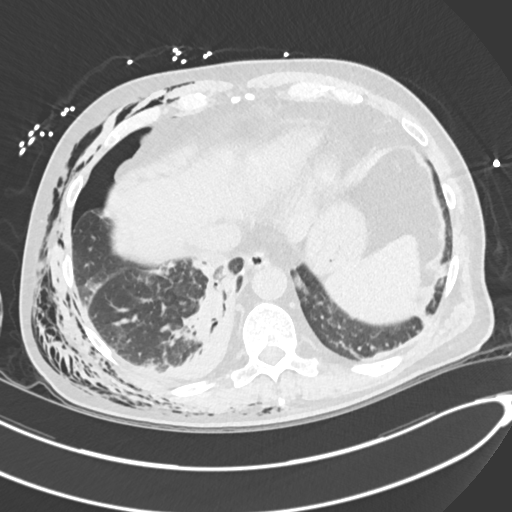
[im 71/183  mediastinal]
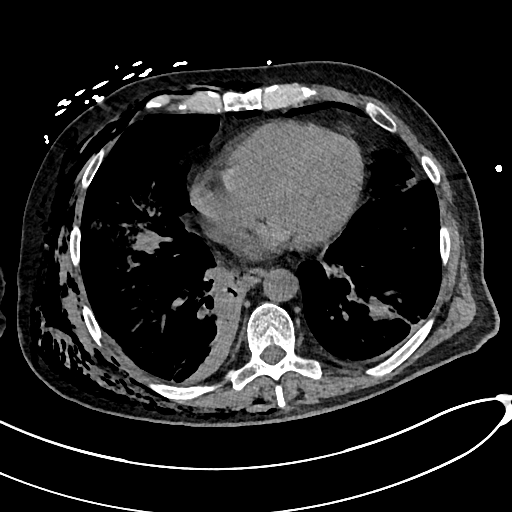
[im 71/183  lung]
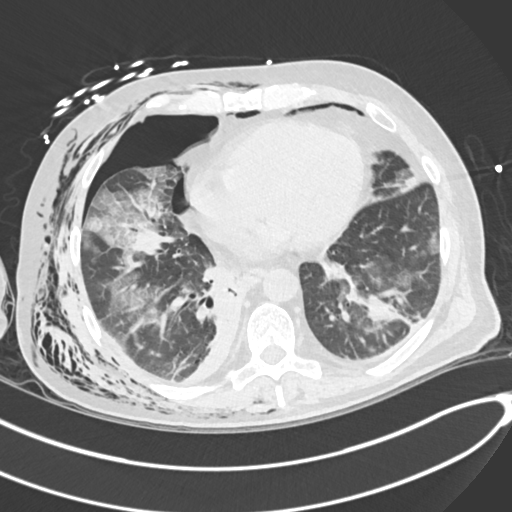
[im 85/183  lung]
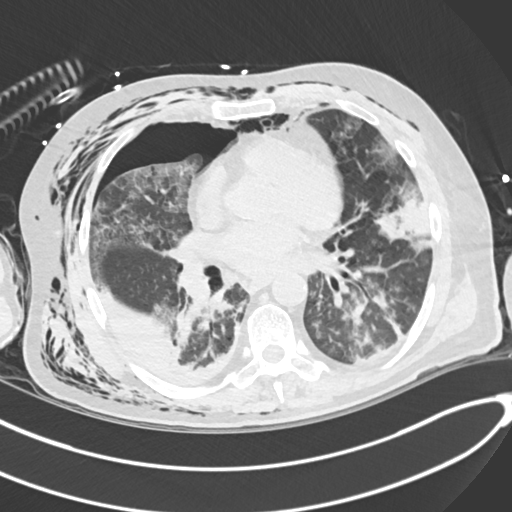
[im 99/183  lung]
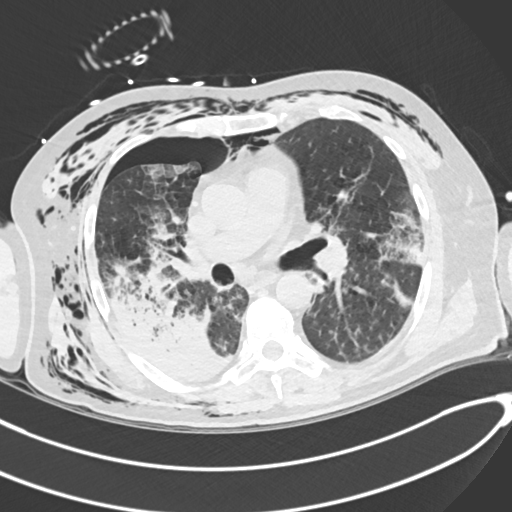
[im 113/183  lung]
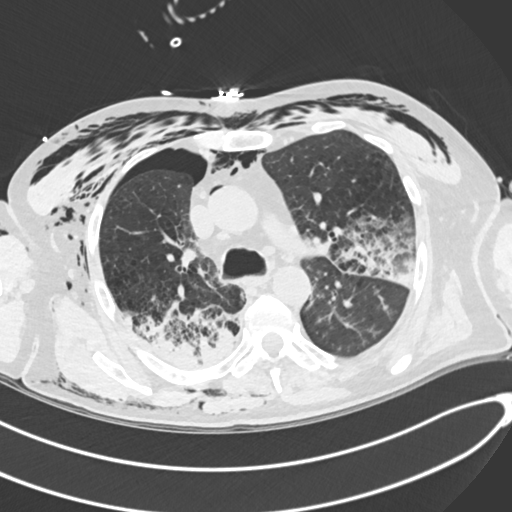
[im 127/183  mediastinal]
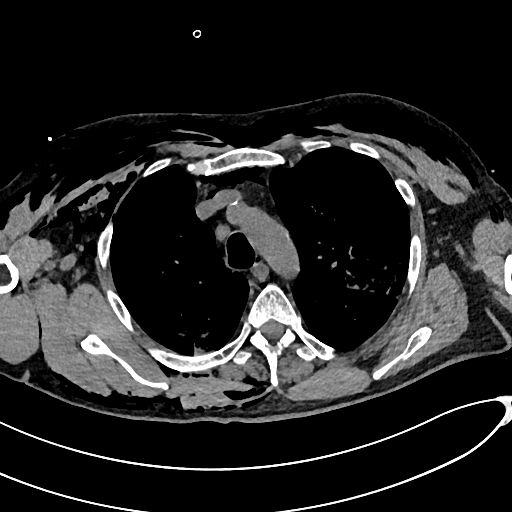
[im 127/183  lung]
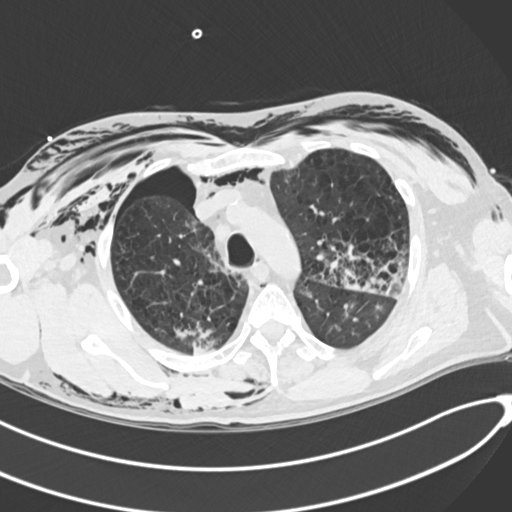
[im 141/183  lung]
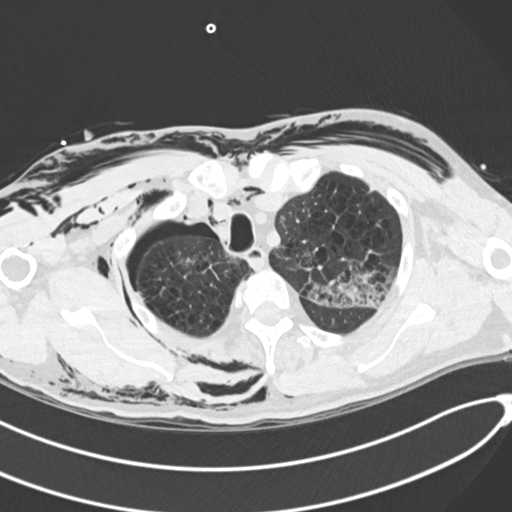
[im 155/183  lung]
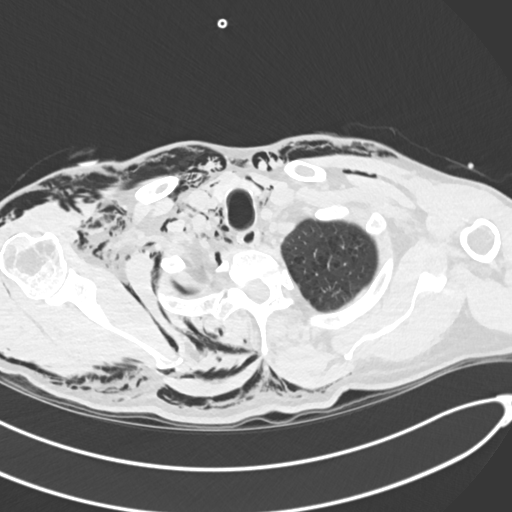
[im 169/183  lung]
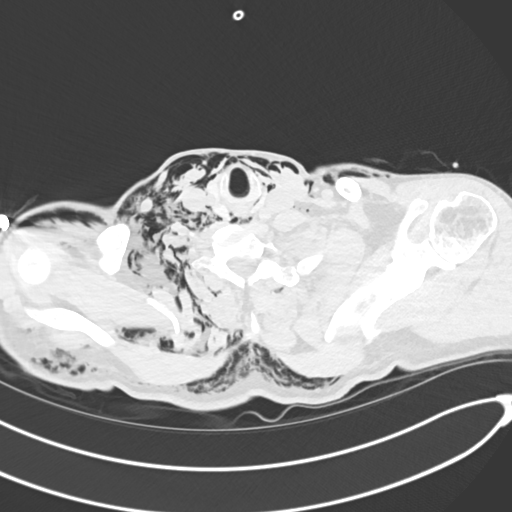

[Series 6: cor · coronal · 0.71mm/px · 3 of 151 slices shown]
[im 31/151  lung]
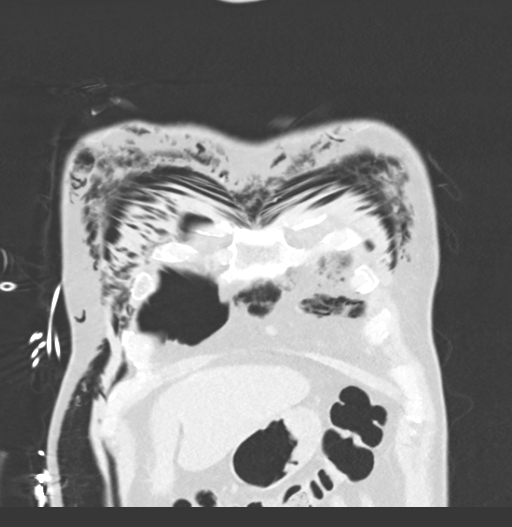
[im 61/151  lung]
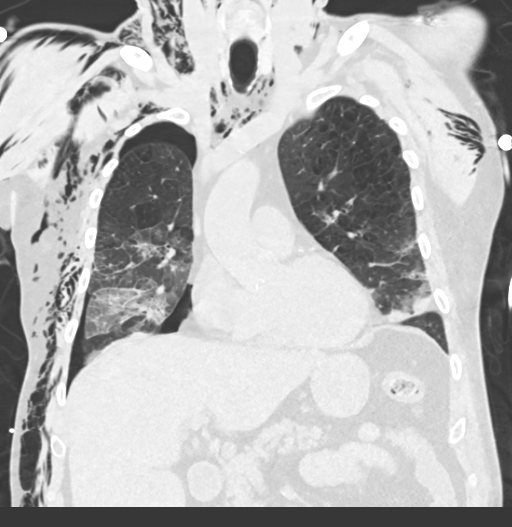
[im 91/151  lung]
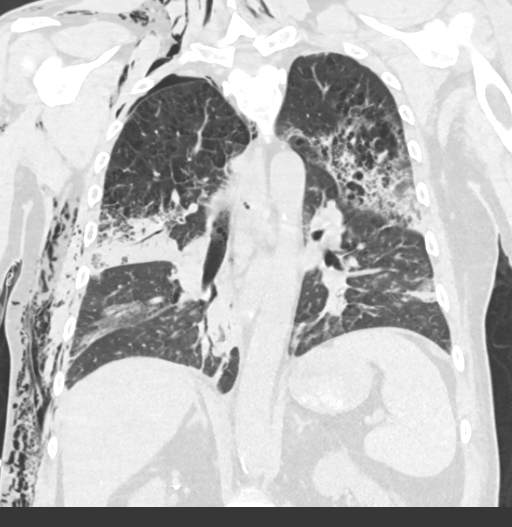

[15 of 36 positions shown; findings below may reference images not displayed]

FINDINGS: Cardiovascular: Normal heart size. No pericardial effusion. Normal
caliber thoracic aorta. Aortic and coronary artery calcifications.

Mediastinum/Nodes: Pneumomediastinum extending down from the neck
and involving the anterior mediastinum. Scattered lymph nodes in the
mediastinum are not pathologically enlarged and likely reactive.
Esophagus is decompressed.

Lungs/Pleura: Small right pneumothorax is increasing since previous
study. There is new collapse or consolidation of the right lung
base. Patchy multifocal areas of airspace and interstitial
infiltration in both lungs, demonstrating progression since previous
study. This may represent pneumonia or edema. No pleural effusions.
There is evidence of layering material within the trachea and main
bronchi. This could represent aspiration.

Upper Abdomen: Increased density in the gallbladder likely
representing vicarious excretion of previous contrast material. No
acute process otherwise demonstrated in the upper abdomen.

Musculoskeletal: There is extensive subcutaneous emphysema in the
right side of the chest with extension to the left upper chest and
neck.Multiple right rib fractures again demonstrated. Mild
displacement.
IMPRESSION: 1. Increasing right pneumothorax since previous study.
2. Extensive subcutaneous emphysema and pneumomediastinum, also
increased since previous studies.
3. Increasing consolidation or volume loss in the right lung with
patchy multifocal areas of airspace and interstitial infiltration in
both lungs, demonstrating progression. This could represent
pneumonia or edema. Layering material within the trachea could
indicate aspiration pneumonia.
4. Increased density in the gallbladder likely represents vicarious
excretion of previous contrast material.

## 2018-12-18 IMAGING — DX DG CHEST 1V PORT
1 series · 1 of 1 positions shown · non-contrast
Comparison: Chest x-ray from earlier same day.

CLINICAL DATA: Post right LAIREITER placement.

EXAM:
PORTABLE CHEST 1 VIEW

[chest ap]
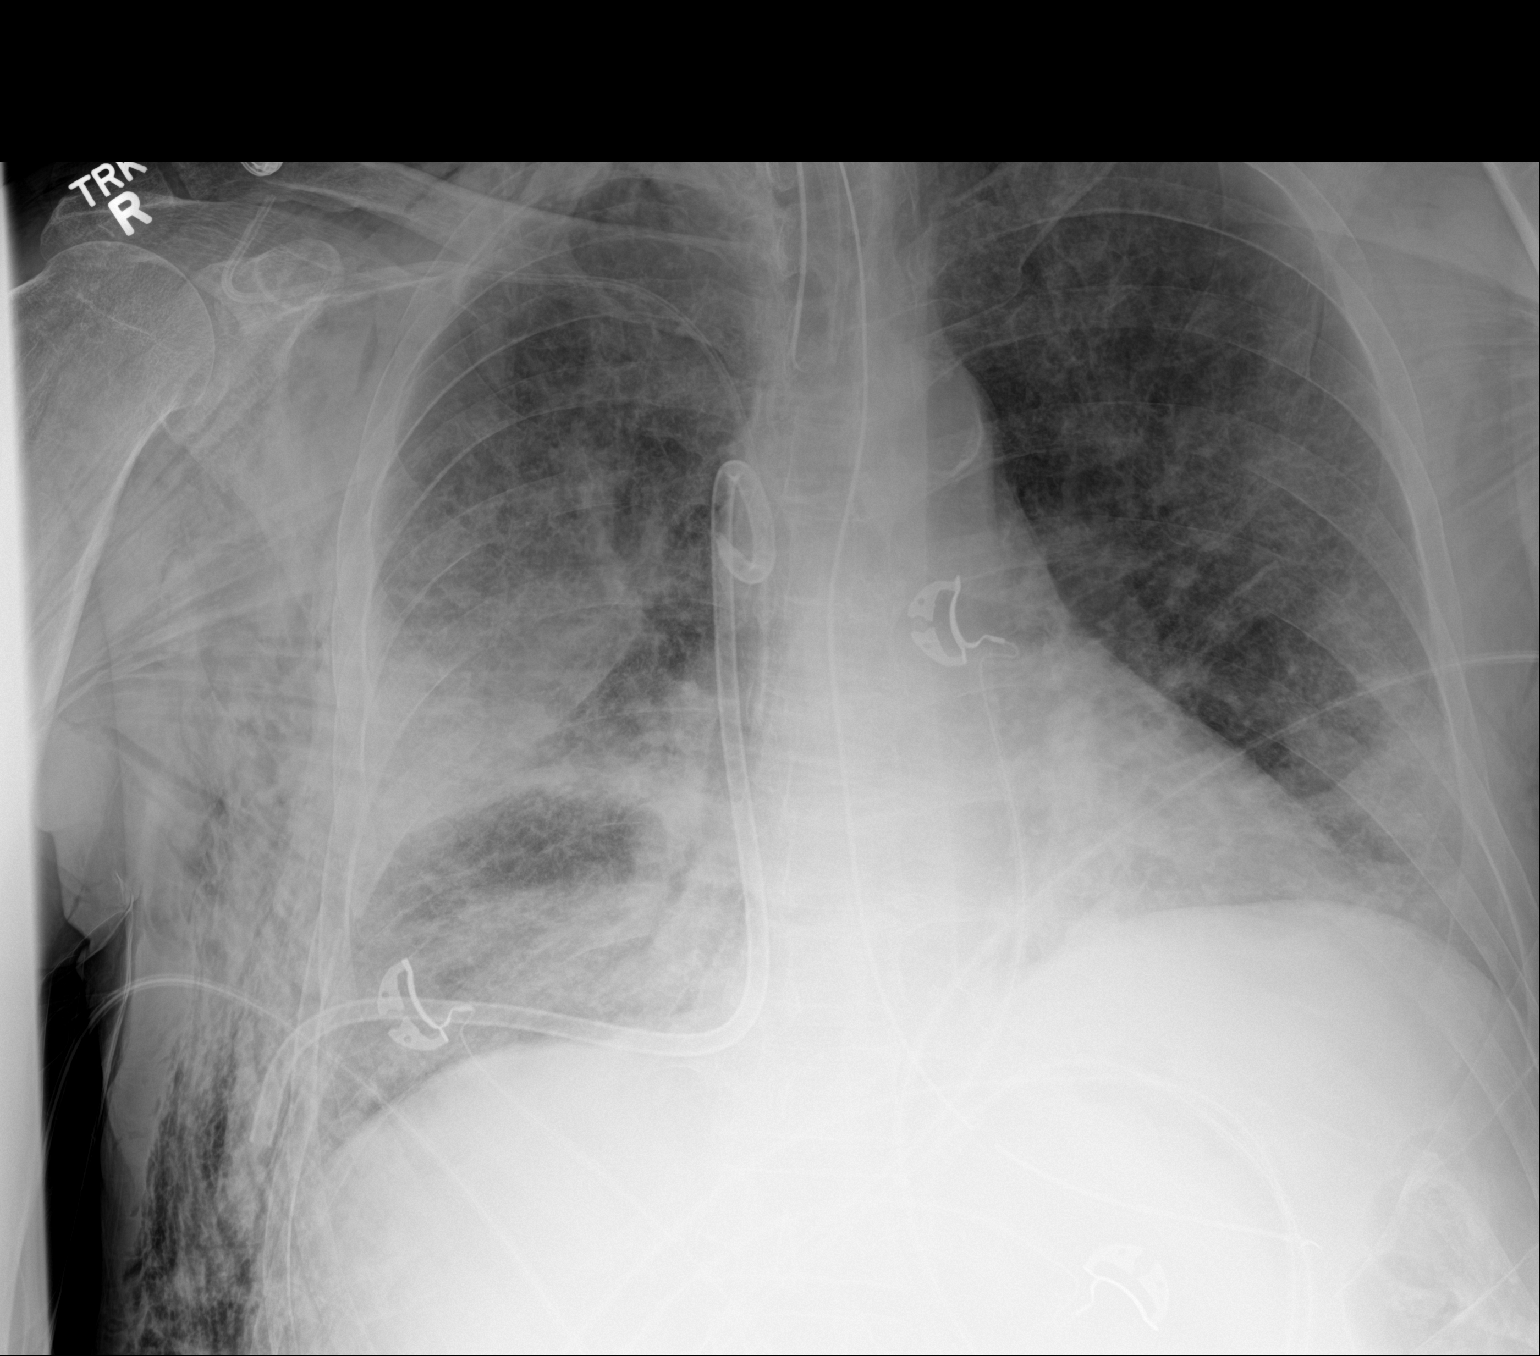

[1 of 1 positions shown; findings below may reference images not displayed]

FINDINGS: The newly placed right-sided central line appears well positioned at
the level of the lower SVC.

Endotracheal tube is stable in position with tip approximately 4 cm
above the carina. Enteric tube passes below the diaphragm.
Right-sided chest tube is stable in position with tip near the right
hilum. Atherosclerotic changes noted at the aortic arch.

Subcutaneous emphysema again noted along the right lateral chest
wall and within the right lower neck. Stable bibasilar opacities,
right greater than left, with differential considerations of edema,
contusion and aspiration. No new lung findings. No pneumothorax seen
although characterization is limited by the overlying subcutaneous
emphysema.
IMPRESSION: 1. Right-sided central line appears well positioned with tip at the
level of the lower SVC.
2. Otherwise stable chest x-ray, as detailed above.
3. Aortic atherosclerosis.

## 2018-12-18 MED ORDER — ONDANSETRON HCL 4 MG PO TABS: 4.0000 mg | ORAL_TABLET | Freq: Four times a day (QID) | ORAL | Status: DC | PRN

## 2018-12-18 MED ORDER — ASPIRIN EC 81 MG PO TBEC
81.0000 mg | DELAYED_RELEASE_TABLET | Freq: Every day | ORAL | Status: DC
  Administered 2018-12-18: 11:00:00 81 mg via ORAL
  Filled 2018-12-18: qty 1

## 2018-12-18 MED ORDER — ENOXAPARIN SODIUM 40 MG/0.4ML ~~LOC~~ SOLN: 40.0000 mg | SUBCUTANEOUS | Status: DC

## 2018-12-18 MED ORDER — STROKE: EARLY STAGES OF RECOVERY BOOK
Freq: Once | Status: AC
  Administered 2018-12-18: 11:00:00

## 2018-12-18 MED ORDER — ACETAMINOPHEN 650 MG RE SUPP: 650.0000 mg | Freq: Four times a day (QID) | RECTAL | Status: DC | PRN

## 2018-12-18 MED ORDER — ONDANSETRON HCL 4 MG/2ML IJ SOLN: 4.0000 mg | Freq: Four times a day (QID) | INTRAMUSCULAR | Status: DC | PRN

## 2018-12-18 MED ORDER — ALBUTEROL SULFATE (2.5 MG/3ML) 0.083% IN NEBU: 2.5000 mg | INHALATION_SOLUTION | RESPIRATORY_TRACT | Status: DC | PRN

## 2018-12-18 MED ORDER — POTASSIUM CHLORIDE CRYS ER 20 MEQ PO TBCR
40.0000 meq | EXTENDED_RELEASE_TABLET | Freq: Once | ORAL | Status: AC
  Administered 2018-12-18: 40 meq via ORAL
  Filled 2018-12-18: qty 4

## 2018-12-18 MED ORDER — DOCUSATE SODIUM 100 MG PO CAPS
100.0000 mg | ORAL_CAPSULE | Freq: Two times a day (BID) | ORAL | Status: DC
  Administered 2018-12-18: 100 mg via ORAL
  Filled 2018-12-18: qty 1

## 2018-12-18 MED ORDER — ATORVASTATIN CALCIUM 40 MG PO TABS: 40.0000 mg | ORAL_TABLET | Freq: Every day | ORAL | 1 refills | Status: DC

## 2018-12-18 MED ORDER — CLOPIDOGREL BISULFATE 75 MG PO TABS: 75.0000 mg | ORAL_TABLET | Freq: Every day | ORAL | 1 refills | Status: AC

## 2018-12-18 MED ORDER — ATORVASTATIN CALCIUM 20 MG PO TABS: 40.0000 mg | ORAL_TABLET | Freq: Every day | ORAL | Status: DC

## 2018-12-18 MED ORDER — POTASSIUM CHLORIDE IN NACL 40-0.9 MEQ/L-% IV SOLN
INTRAVENOUS | Status: DC
  Administered 2018-12-18: 11:00:00 100 mL/h via INTRAVENOUS
  Filled 2018-12-18 (×2): qty 1000

## 2018-12-18 MED ORDER — PANTOPRAZOLE SODIUM 40 MG PO TBEC
80.0000 mg | DELAYED_RELEASE_TABLET | Freq: Every day | ORAL | Status: DC
  Administered 2018-12-18: 11:00:00 80 mg via ORAL
  Filled 2018-12-18: qty 2

## 2018-12-18 MED ORDER — ACETAMINOPHEN 325 MG PO TABS
650.0000 mg | ORAL_TABLET | Freq: Four times a day (QID) | ORAL | Status: DC | PRN
  Administered 2018-12-18 (×2): 650 mg via ORAL
  Filled 2018-12-18 (×2): qty 2

## 2018-12-18 MED ORDER — LOSARTAN POTASSIUM 50 MG PO TABS
50.0000 mg | ORAL_TABLET | Freq: Every day | ORAL | Status: DC
  Administered 2018-12-18: 50 mg via ORAL
  Filled 2018-12-18: qty 1

## 2018-12-18 MED ORDER — UMECLIDINIUM-VILANTEROL 62.5-25 MCG/INH IN AEPB
1.0000 | INHALATION_SPRAY | Freq: Every day | RESPIRATORY_TRACT | Status: DC
  Administered 2018-12-18: 1 via RESPIRATORY_TRACT
  Filled 2018-12-18: qty 14

## 2018-12-18 NOTE — H&P (Signed)
Derrick Sheppard is an 63 y.o. male.   Chief Complaint: Altered mental status HPI: The patient with past medical history of hypertension and COPD presents to the emergency department with confusion and ataxia.  The patient's family reports that the patient's blood pressure was very elevated earlier this evening and he began speaking nonsensically.  CT of his head in the emergency department was unremarkable but MRI showed a subcentimeter left occipital lobe infarct with questionable flow artifact.  The patient also has right thalamic and corona radiata infarct with mild to moderate small vessel ischemia.  Due to his risk factors for stroke the emergency department staff called the hospitalist service for admission.  Past Medical History:  Diagnosis Date  . Allergic state   . Arthritis   . Asthma   . B12 deficiency   . COPD (chronic obstructive pulmonary disease) ([place])   . Coronary artery disease   . Dysrhythmia   . Edema   . GERD (gastroesophageal reflux disease)   . Headache   . Palpitations   . Pulmonary nodules     Past Surgical History:  Procedure Laterality Date  . COLONOSCOPY    . COLONOSCOPY WITH PROPOFOL N/A 04/09/2016   Procedure: COLONOSCOPY WITH PROPOFOL;  Surgeon: Lollie Sails, MD;  Location: [facility] ENDOSCOPY;  Service: Endoscopy;  Laterality: N/A;    Family History  Problem Relation Age of Onset  . Prostate cancer Neg Hx   . Bladder Cancer Neg Hx   . Kidney cancer Neg Hx    Social History:  reports that he quit smoking about 15 months ago. His smoking use included cigarettes. He has a 69.00 pack-year smoking history. He has never used smokeless tobacco. He reports that he does not drink alcohol or use drugs.  Allergies:  Allergies  Allergen Reactions  . Amlodipine Swelling    Makes his ankles and legs swell.  . Codeine Other (See Comments)    Gives him a Headache.    Prior to Admission medications   Medication Sig Start Date End Date Taking? Authorizing  Provider  albuterol (PROVENTIL HFA;VENTOLIN HFA) 108 (90 Base) MCG/ACT inhaler Inhale 2 puffs into the lungs every 6 (six) hours as needed for wheezing or shortness of breath.    [provider]  aspirin EC 81 MG tablet Take 81 mg by mouth daily.    [provider]  B Complex-C (B-COMPLEX WITH VITAMIN C) tablet Take 1 tablet by mouth daily.    [provider]  Cholecalciferol 1000 units TBDP Take 1,000 Units/day by mouth daily.    [provider]  esomeprazole (NEXIUM) 40 MG capsule Take 40 mg by mouth daily at 12 noon.    [provider]  Fluticasone-Umeclidin-Vilant (TRELEGY ELLIPTA) 100-62.5-25 MCG/INH AEPB Inhale into the lungs.    [provider]  ibuprofen (ADVIL,MOTRIN) 200 MG tablet Take 600 mg by mouth every 4 (four) hours as needed.     [provider]  losartan (COZAAR) 50 MG tablet Take 50 mg by mouth daily.    [provider]     Results for orders placed or performed during the hospital encounter of 12/17/18 (from the past 48 hour(s))  Ethanol     Status: None   Collection Time: 12/17/18  9:11 PM  Result Value Ref Range   Alcohol, Ethyl (B) <10 <10 mg/dL    Comment: (NOTE) Lowest detectable limit for serum alcohol is 10 mg/dL. For medical purposes only. Performed at [facility], [address] [place], [place] [address]   Protime-INR     Status: None   Collection Time: 12/17/18  9:11 PM  Result Value Ref Range   Prothrombin Time 13.4 11.4 - 15.2 seconds   INR 1.0 0.8 - 1.2    Comment: (NOTE) INR goal varies based on device and disease states. Performed at [facility], [address] [place], [place] [address]   APTT     Status: None   Collection Time: 12/17/18  9:11 PM  Result Value Ref Range   aPTT 30 24 - 36 seconds    Comment: Performed at [facility], [address] [place], [place] [address]  CBC     Status: None   Collection Time: 12/17/18  9:11 PM   Result Value Ref Range   WBC 7.9 4.0 - 10.5 K/uL   RBC 4.91 4.22 - 5.81 MIL/uL   Hemoglobin 14.2 13.0 - 17.0 g/dL   HCT 39.9 39.0 - 52.0 %   MCV 81.3 80.0 - 100.0 fL   MCH 28.9 26.0 - 34.0 pg   MCHC 35.6 30.0 - 36.0 g/dL   RDW 12.3 11.5 - 15.5 %   Platelets 241 150 - 400 K/uL   nRBC 0.0 0.0 - 0.2 %    Comment: Performed at [facility], [address] [place], [place] [address]  Differential     Status: None   Collection Time: 12/17/18  9:11 PM  Result Value Ref Range   Neutrophils Relative % 58 %   Neutro Abs 4.6 1.7 - 7.7 K/uL   Lymphocytes Relative 29 %   Lymphs Abs 2.3 0.7 - 4.0 K/uL   Monocytes Relative 7 %   Monocytes Absolute 0.6 0.1 - 1.0 K/uL   Eosinophils Relative 5 %   Eosinophils Absolute 0.4 0.0 - 0.5 K/uL   Basophils Relative 1 %   Basophils Absolute 0.1 0.0 - 0.1 K/uL   Immature Granulocytes 0 %   Abs Immature Granulocytes 0.02 0.00 - 0.07 K/uL    Comment: Performed at [facility], [address] [place], [place] [address]  Comprehensive metabolic panel     Status: Abnormal   Collection Time: 12/17/18  9:11 PM  Result Value Ref Range   Sodium 140 135 - 145 mmol/L   Potassium 3.2 (L) 3.5 - 5.1 mmol/L   Chloride 107 98 - 111 mmol/L   CO2 25 22 - 32 mmol/L   Glucose, Bld 107 (H) 70 - 99 mg/dL   BUN 10 8 - 23 mg/dL   Creatinine, Ser 1.15 0.61 - 1.24 mg/dL   Calcium 8.5 (L) 8.9 - 10.3 mg/dL   Total Protein 7.3 6.5 - 8.1 g/dL   Albumin 4.1 3.5 - 5.0 g/dL   AST 18 15 - 41 U/L   ALT 15 0 - 44 U/L   Alkaline Phosphatase 153 (H) 38 - 126 U/L   Total Bilirubin 1.1 0.3 - 1.2 mg/dL   GFR calc non Af Amer >60 >60 mL/min   GFR calc Af Amer >60 >60 mL/min   Anion gap 8 5 - 15    Comment: Performed at [facility], [address] [place], [place] [address]  Glucose, capillary     Status: Abnormal   Collection Time: 12/17/18  9:18 PM  Result Value Ref Range   Glucose-Capillary 102 (H) 70 - 99 mg/dL  Urine Drug Screen, Qualitative      Status: None   Collection Time: 12/17/18  9:36 PM  Result Value Ref Range   Tricyclic, Ur Screen NONE DETECTED NONE DETECTED   Amphetamines, Ur Screen NONE DETECTED NONE DETECTED   MDMA (Ecstasy)Ur Screen NONE DETECTED NONE DETECTED   Cocaine Metabolite,Ur [place] NONE DETECTED NONE DETECTED   Opiate, Ur Screen NONE DETECTED NONE DETECTED   Phencyclidine (PCP) Ur S NONE DETECTED NONE DETECTED   Cannabinoid 50 Ng, Ur  NONE DETECTED NONE DETECTED   Barbiturates, Ur Screen NONE DETECTED NONE DETECTED   Benzodiazepine, Ur Scrn NONE DETECTED NONE DETECTED   Methadone Scn, Ur NONE DETECTED NONE DETECTED    Comment: (NOTE) Tricyclics + metabolites, urine    Cutoff 1000 ng/mL Amphetamines + metabolites, urine  Cutoff 1000 ng/mL MDMA (Ecstasy), urine              Cutoff 500 ng/mL Cocaine Metabolite, urine          Cutoff 300 ng/mL Opiate + metabolites, urine        Cutoff 300 ng/mL Phencyclidine (PCP), urine         Cutoff 25 ng/mL Cannabinoid, urine                 Cutoff 50 ng/mL Barbiturates + metabolites, urine  Cutoff 200 ng/mL Benzodiazepine, urine              Cutoff 200 ng/mL Methadone, urine                   Cutoff 300 ng/mL The urine drug screen provides only a preliminary, unconfirmed analytical test result and should not be used for non-medical purposes. Clinical consideration and professional judgment should be applied to any positive drug screen result due to possible interfering substances. A more specific alternate chemical method must be used in order to obtain a confirmed analytical result. Gas chromatography / mass spectrometry (GC/MS) is the preferred confirmat ory method. Performed at [facility], [address] [place], [place] [address]   Urinalysis, Complete w Microscopic     Status: Abnormal   Collection Time: 12/17/18  9:36 PM  Result Value Ref Range   Color, Urine YELLOW (A) YELLOW   APPearance CLEAR (A) CLEAR   Specific Gravity, Urine 1.010  1.005 - 1.030   pH 6.0 5.0 - 8.0   Glucose, UA NEGATIVE NEGATIVE mg/dL   Hgb urine dipstick NEGATIVE NEGATIVE   Bilirubin Urine NEGATIVE NEGATIVE   Ketones, ur NEGATIVE NEGATIVE mg/dL   Protein, ur NEGATIVE NEGATIVE mg/dL   Nitrite NEGATIVE NEGATIVE   Leukocytes,Ua NEGATIVE NEGATIVE   RBC / HPF 0-5 0 - 5 RBC/hpf   WBC, UA 0-5 0 - 5 WBC/hpf   Bacteria, UA NONE SEEN NONE SEEN   Squamous Epithelial / LPF NONE SEEN 0 - 5    Comment: Performed at [facility], [address], [place], [place] [address]   Mr Brain Wo Contrast  Result Date: 12/18/2018 CLINICAL DATA:  Confusion for 2 hours. Hypertensive encephalopathy, assess for stroke. History of hypertension, headache. EXAM: MRI HEAD WITHOUT CONTRAST TECHNIQUE: Multiplanar, multiecho pulse sequences of the brain and surrounding structures were obtained without intravenous contrast. COMPARISON:  CT HEAD December 17, 2018 FINDINGS: INTRACRANIAL CONTENTS: Subcentimeter reduced diffusion LEFT mesial occipital lobe, low ADC values and possibly extra-axial. No susceptibility artifact to suggest hemorrhage. Mild-to-moderate parenchymal brain volume loss. No hydrocephalus. Patchy pontine, RIGHT brachium pontis and supratentorial white matter FLAIR T2 hyperintensities. Old RIGHT thalamus/corona radiata lacunar infarct. VASCULAR: Normal major intracranial vascular flow voids present at skull base. SKULL AND UPPER  CERVICAL SPINE: No abnormal sellar expansion. No suspicious calvarial bone marrow signal. Craniocervical junction maintained. SINUSES/ORBITS: Minimal maxillary sinus mucosal thickening.The included ocular globes and orbital contents are non-suspicious. OTHER: None. IMPRESSION: 1. Acute subcentimeter LEFT occipital lobe/PCA territory infarct versus flow artifact. 2. Old RIGHT thalamus/corona radiata small infarct. Moderate chronic small vessel ischemic changes. 3. Mild-to-moderate parenchymal brain volume loss, advanced for age. Electronically Signed    By: Elon Alas M.D.   On: 12/18/2018 03:10   Ct Head Code Stroke Wo Contrast  Result Date: 12/17/2018 CLINICAL DATA:  Code stroke. Focal neuro deficit for less than 6 hours. Stroke suspected. Confusion beginning 2 hours ago. EXAM: CT HEAD WITHOUT CONTRAST TECHNIQUE: Contiguous axial images were obtained from the base of the skull through the vertex without intravenous contrast. COMPARISON:  CT head without contrast 09/15/2017 FINDINGS: Brain: Moderate atrophy and white matter disease is again seen bilaterally. A lacunar infarct involving the right centrum semi ovale is new since the prior study. This does not appear acute. Basal ganglia are otherwise intact. Insular ribbon is normal. No acute or focal cortical abnormality is present. Pineal calcification is again noted. The brainstem and cerebellum are within normal limits. Vascular: Atherosclerotic calcifications are present within the cavernous internal carotid arteries. There is no hyperdense vessel. Skull: Calvarium is intact. No focal lytic or blastic lesions are present. Sinuses/Orbits: The paranasal sinuses and mastoid air cells are clear. The globes and orbits are within normal limits. ASPECTS [facility] Stroke Program Early CT Score) - Ganglionic level infarction (caudate, lentiform nuclei, internal capsule, insula, M1-M3 cortex): 7/7 - Supraganglionic infarction (M4-M6 cortex): 3/3 Total score (0-10 with 10 being normal): 10/10 IMPRESSION: 1. No acute intracranial abnormality. 2. 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 3. Stable atrophy and diffuse white matter disease 4. ASPECTS is 10/10 These results were called by telephone at the time of interpretation on 12/17/2018 at 9:38 pm to Dr. Carrie Mew , who verbally acknowledged these results. Electronically Signed   By: San Morelle M.D.   On: 12/17/2018 21:38    Review of Systems  Unable to perform ROS: Mental  status change    Blood pressure (!) 167/99, pulse (!) 57, temperature 98.6 F (37 C), temperature source Oral, resp. rate 16, height 5\' 6"  (1.676 m), weight 83.9 kg, SpO2 90 %. Physical Exam  Vitals reviewed. Constitutional: He is oriented to person, place, and time. He appears well-developed and well-nourished. No distress.  HENT:  Head: Normocephalic and atraumatic.  Mouth/Throat: Oropharynx is clear and moist.  Eyes: Pupils are equal, round, and reactive to light. Conjunctivae and EOM are normal. No scleral icterus.  Neck: Normal range of motion. Neck supple. No JVD present. No tracheal deviation present. No thyromegaly present.  Cardiovascular: Normal rate, regular rhythm and normal heart sounds. Exam reveals no gallop and no friction rub.  No murmur heard. Respiratory: Effort normal and breath sounds normal. No respiratory distress.  GI: Soft. Bowel sounds are normal. He exhibits no distension. There is no abdominal tenderness.  Genitourinary:    Genitourinary Comments: Deferred   Musculoskeletal: Normal range of motion.        General: No edema.  Lymphadenopathy:    He has no cervical adenopathy.  Neurological: He is alert and oriented to person, place, and time. He has normal reflexes. No cranial nerve deficit.  Skin: Skin is warm and dry. No rash noted. No erythema.  Psychiatric: He has a  normal mood and affect. His behavior is normal. Judgment and thought content normal.     Assessment/Plan This is a 63 year old male admitted for stroke. 1.  Stroke: Possible infarct in PCA territory left parieto-occipital lobe.  Symptoms have resolved.  Consult neurology.  Continue aspirin 2.  Hypertension: Uncontrolled; tinea losartan.  Labetalol as needed. 3.  Hypokalemia: Replete potassium 4.  COPD: Stable continue inhaled corticosteroid long-acting bronchial agonist.  Albuterol as needed 5.  DVT prophylaxis: SCDs only 6.  GI prophylaxis: PPI per home regimen The patient is a full  code.  Time spent on admission orders and patient care approximately 45 minutes  Harrie Foreman, MD 12/18/2018, 4:29 AM

## 2018-12-18 NOTE — Progress Notes (Signed)
OT Cancellation Note  Patient Details Name: Romond Pipkins MRN: [id number] DOB: 1956-09-03   Cancelled Treatment:    Reason Eval/Treat Not Completed: Other (comment). Per RN, pt preparing for discharge. Unavailable for OT evaluation at this time. Will re-attempt as appropriate should pt not discharge today.   Jeni Salles, MPH, MS, OTR/L ascom [phone] 12/18/18, 2:58 PM

## 2018-12-18 NOTE — ED Notes (Signed)
Patient transported to MRI.AS 

## 2018-12-18 NOTE — ED Notes (Signed)
Pt being transported to MRI at this time.

## 2018-12-18 NOTE — ED Notes (Signed)
Pt back in room from MRI. Pt placed in hospital bed for comfort at this time.

## 2018-12-18 NOTE — Progress Notes (Signed)
[facility] [place] NAME: Derrick Sheppard    MR#:  [id number]  DATE OF BIRTH:  1956/06/14  SUBJECTIVE:   Patient presented with some aphasia which has now resolved.  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  Patient denies any headache, nausea, vomiting or any focal weakness.  Family is at bedside.  REVIEW OF SYSTEMS:    Review of Systems  Constitutional: Negative for chills and fever.  HENT: Negative for congestion and tinnitus.   Eyes: Negative for blurred vision and double vision.  Respiratory: Negative for cough, shortness of breath and wheezing.   Cardiovascular: Negative for chest pain, orthopnea and PND.  Gastrointestinal: Negative for abdominal pain, diarrhea, nausea and vomiting.  Genitourinary: Negative for dysuria and hematuria.  Neurological: Negative for dizziness, sensory change and focal weakness.  All other systems reviewed and are negative.   Nutrition: Heart Healthy  Tolerating Diet: Yes Tolerating PT: Await Eval.    DRUG ALLERGIES:   Allergies  Allergen Reactions  . Amlodipine Swelling    Makes his ankles and legs swell.  . Codeine Other (See Comments)    Gives him a Headache.    VITALS:  Blood pressure (!) 161/97, pulse 62, temperature (!) 97.4 F (36.3 C), temperature source Oral, resp. rate 18, height 5\' 6"  (1.676 m), weight 83.9 kg, SpO2 96 %.  PHYSICAL EXAMINATION:   Physical Exam  GENERAL:  63 y.o.-year-old patient lying in bed in no acute distress.  EYES: Pupils equal, round, reactive to light and accommodation. No scleral icterus. Extraocular muscles intact.  HEENT: Head atraumatic, normocephalic. Oropharynx and nasopharynx clear.  NECK:  Supple, no jugular venous distention. No thyroid enlargement, no tenderness.  LUNGS: Normal breath sounds bilaterally, no wheezing, rales, rhonchi. No use of accessory muscles of respiration.  CARDIOVASCULAR: S1, S2 normal. No murmurs,  rubs, or gallops.  ABDOMEN: Soft, nontender, nondistended. Bowel sounds present. No organomegaly or mass.  EXTREMITIES: No cyanosis, clubbing or edema b/l.    NEUROLOGIC: Cranial nerves II through XII are intact. No focal Motor or sensory deficits b/l.   PSYCHIATRIC: The patient is alert and oriented x 3.  SKIN: No obvious rash, lesion, or ulcer.    LABORATORY PANEL:   CBC Recent Labs  Lab 12/17/18 2111  WBC 7.9  HGB 14.2  HCT 39.9  PLT 241   ------------------------------------------------------------------------------------------------------------------  Chemistries  Recent Labs  Lab 12/17/18 2111  NA 140  K 3.2*  CL 107  CO2 25  GLUCOSE 107*  BUN 10  CREATININE 1.15  CALCIUM 8.5*  AST 18  ALT 15  ALKPHOS 153*  BILITOT 1.1   ------------------------------------------------------------------------------------------------------------------  Cardiac Enzymes No results for input(s): TROPONINI in the last 168 hours. ------------------------------------------------------------------------------------------------------------------  RADIOLOGY:  Mr Brain Wo Contrast  Result Date: 12/18/2018 CLINICAL DATA:  Confusion for 2 hours. Hypertensive encephalopathy, assess for stroke. History of hypertension, headache. EXAM: MRI HEAD WITHOUT CONTRAST TECHNIQUE: Multiplanar, multiecho pulse sequences of the brain and surrounding structures were obtained without intravenous contrast. COMPARISON:  CT HEAD December 17, 2018 FINDINGS: INTRACRANIAL CONTENTS: Subcentimeter reduced diffusion LEFT mesial occipital lobe, low ADC values and possibly extra-axial. No susceptibility artifact to suggest hemorrhage. Mild-to-moderate parenchymal brain volume loss. No hydrocephalus. Patchy pontine, RIGHT brachium pontis and supratentorial white matter FLAIR T2 hyperintensities. Old RIGHT thalamus/corona radiata lacunar infarct. VASCULAR: Normal major intracranial vascular flow voids present at skull base.  SKULL AND UPPER CERVICAL SPINE: No abnormal sellar expansion. No suspicious calvarial  bone marrow signal. Craniocervical junction maintained. SINUSES/ORBITS: Minimal maxillary sinus mucosal thickening.The included ocular globes and orbital contents are non-suspicious. OTHER: None. IMPRESSION: 1. Acute subcentimeter LEFT occipital lobe/PCA territory infarct versus flow artifact. 2. Old RIGHT thalamus/corona radiata small infarct. Moderate chronic small vessel ischemic changes. 3. Mild-to-moderate parenchymal brain volume loss, advanced for age. Electronically Signed   By: Elon Alas M.D.   On: 12/18/2018 03:10   US Carotid Bilateral (at [facility] Only)  Result Date: 12/18/2018 CLINICAL DATA:  63 year old male with a history of stroke EXAM: BILATERAL CAROTID DUPLEX ULTRASOUND TECHNIQUE: Pearline Cables scale imaging, color Doppler and duplex ultrasound were performed of bilateral carotid and vertebral arteries in the neck. COMPARISON:  None. FINDINGS: Criteria: Quantification of carotid stenosis is based on velocity parameters that correlate the residual internal carotid diameter with NASCET-based stenosis levels, using the diameter of the distal internal carotid lumen as the denominator for stenosis measurement. The following velocity measurements were obtained: RIGHT ICA:  Systolic 967 cm/sec, Diastolic 27 cm/sec CCA:  85 cm/sec SYSTOLIC ICA/CCA RATIO:  1.3 ECA:  111 cm/sec LEFT ICA:  Systolic 65 cm/sec, Diastolic 22 cm/sec CCA:  79 cm/sec SYSTOLIC ICA/CCA RATIO:  0.8 ECA:  125 cm/sec Right Brachial SBP: Not acquired Left Brachial SBP: Not acquired RIGHT CAROTID ARTERY: No significant calcifications of the right common carotid artery. Intermediate waveform maintained. Heterogeneous and partially calcified plaque at the right carotid bifurcation. No significant lumen shadowing. Low resistance waveform of the right ICA. No significant tortuosity. RIGHT VERTEBRAL ARTERY: Antegrade flow with low resistance waveform.  LEFT CAROTID ARTERY: No significant calcifications of the left common carotid artery. Intermediate waveform maintained. Heterogeneous and partially calcified plaque at the left carotid bifurcation without significant lumen shadowing. Low resistance waveform of the left ICA. No significant tortuosity. LEFT VERTEBRAL ARTERY:  Antegrade flow with low resistance waveform. IMPRESSION: Color duplex indicates minimal heterogeneous and calcified plaque, with no hemodynamically significant stenosis by duplex criteria in the extracranial cerebrovascular circulation. Signed, Dulcy Fanny. Dellia Nims, RPVI [facility] Radiology Electronically Signed   By: Corrie Mckusick D.O.   On: 12/18/2018 11:05   Ct Head Code Stroke Wo Contrast  Result Date: 12/17/2018 CLINICAL DATA:  Code stroke. Focal neuro deficit for less than 6 hours. Stroke suspected. Confusion beginning 2 hours ago. EXAM: CT HEAD WITHOUT CONTRAST TECHNIQUE: Contiguous axial images were obtained from the base of the skull through the vertex without intravenous contrast. COMPARISON:  CT head without contrast 09/15/2017 FINDINGS: Brain: Moderate atrophy and white matter disease is again seen bilaterally. A lacunar infarct involving the right centrum semi ovale is new since the prior study. This does not appear acute. Basal ganglia are otherwise intact. Insular ribbon is normal. No acute or focal cortical abnormality is present. Pineal calcification is again noted. The brainstem and cerebellum are within normal limits. Vascular: Atherosclerotic calcifications are present within the cavernous internal carotid arteries. There is no hyperdense vessel. Skull: Calvarium is intact. No focal lytic or blastic lesions are present. Sinuses/Orbits: The paranasal sinuses and mastoid air cells are clear. The globes and orbits are within normal limits. ASPECTS [facility] Stroke Program Early CT Score) - Ganglionic level infarction (caudate,  lentiform nuclei, internal capsule, insula, M1-M3 cortex): 7/7 - Supraganglionic infarction (M4-M6 cortex): 3/3 Total score (0-10 with 10 being normal): 10/10 IMPRESSION: 1. No acute intracranial abnormality. 2. 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 3. Stable atrophy  and diffuse white matter disease 4. ASPECTS is 10/10 These results were called by telephone at the time of interpretation on 12/17/2018 at 9:38 pm to Dr. Carrie Mew , who verbally acknowledged these results. Electronically Signed   By: San Morelle M.D.   On: 12/17/2018 21:38     ASSESSMENT AND PLAN:   63 year old male with past medical history of COPD, hypertension, coronary artery disease, osteoarthritis, GERD who presented to the hospital due to aphasia and noted to have an acute CVA.  1.  Acute CVA-this was the cause of patient's aphasia.  Patient's MRI is positive for a left occipital/PCA territory CVA. - Aphasia has now resolved and patient is back to baseline.  He had no other focal deficits. - Carotid duplex is negative for acute pathology, echocardiogram done but results pending. -Continue aspirin, will start atorvastatin. -Await neurology input.  2.  Essential hypertension-continue losartan.  3.  COPD-no acute exacerbation.  Continue Anoro Ellipta, albuterol nebulizers as needed.  4.  GERD-continue Protonix.     All the records are reviewed and case discussed with Care Management/Social Worker. Management plans discussed with the patient, family and they are in agreement.  CODE STATUS: Full code  DVT Prophylaxis: Lovenox  TOTAL TIME TAKING CARE OF THIS PATIENT: 30 minutes.   POSSIBLE D/C IN 1-2 DAYS, DEPENDING ON CLINICAL CONDITION.   Henreitta Leber M.D on 12/18/2018 at 1:37 PM  Between 7am to 6pm - Pager - [phone]  After 6pm go to [email or web address] - Proofreader  [facility] [place]  Office   [phone]  CC: Primary care physician; Adin Hector, MD

## 2018-12-18 NOTE — Progress Notes (Signed)
*  PRELIMINARY RESULTS* Echocardiogram 2D Echocardiogram has been performed.  Sherrie Sport 12/18/2018, 11:46 AM

## 2018-12-18 NOTE — Progress Notes (Signed)
SLP Cancellation Note  Patient Details Name: Derrick Sheppard MRN: [id number] DOB: 1956-04-23   Cancelled treatment:       Reason Eval/Treat Not Completed: SLP screened, no needs identified, will sign off(chart reviewed; consulted NSG then met w/ pt/family in room). Pt denied any difficulty swallowing and is currently on a regular diet; tolerates swallowing pills w/ water per himself and NSG. Pt conversed at conversational level w/out new deficits noted; pt and family denied any new speech-language deficits. Pt's speech was slightly declined in full articulation but fully intelligible. No further skilled ST services indicated as pt appears at his baseline. Pt agreed. NSG to reconsult if any change in status while admitted.     Orinda Kenner, MS, CCC-SLP Watson,Katherine 12/18/2018, 12:04 PM

## 2018-12-18 NOTE — ED Notes (Signed)
SWAT nurse starting admission process

## 2018-12-18 NOTE — ED Notes (Signed)
Pt assisted to bathroom

## 2018-12-18 NOTE — Consult Note (Signed)
Referring Physician: Verdell Carmine    Chief Complaint: Confusion, gait imbalance  HPI: Derrick Sheppard is an 63 y.o. male who reports that on last night he had acute onset of headache, dizziness and imbalance.  Patient also had some confusion and was unable to name people that he knew.  Patient was brought in for evaluation.  Symptoms began to resolve and today patient is at baseline.  Initial NIHSS of 0.  Patient had a similar event about 4 years ago.  After work up at [facility] patient was told it was related to migraine.  Has had no events since.    Date last known well: Date: 12/17/2018 Time last known well: Time: 19:00 tPA Given: No: Resolution of symptoms  Past Medical History:  Diagnosis Date  . Allergic state   . Arthritis   . Asthma   . B12 deficiency   . COPD (chronic obstructive pulmonary disease) ([place])   . Coronary artery disease   . Dysrhythmia   . Edema   . GERD (gastroesophageal reflux disease)   . Headache   . Palpitations   . Pulmonary nodules     Past Surgical History:  Procedure Laterality Date  . COLONOSCOPY    . COLONOSCOPY WITH PROPOFOL N/A 04/09/2016   Procedure: COLONOSCOPY WITH PROPOFOL;  Surgeon: Lollie Sails, MD;  Location: [facility] ENDOSCOPY;  Service: Endoscopy;  Laterality: N/A;    Family History  Problem Relation Age of Onset  . Prostate cancer Neg Hx   . Bladder Cancer Neg Hx   . Kidney cancer Neg Hx    Social History:  reports that he quit smoking about 15 months ago. His smoking use included cigarettes. He has a 69.00 pack-year smoking history. He has never used smokeless tobacco. He reports that he does not drink alcohol or use drugs.  Allergies:  Allergies  Allergen Reactions  . Amlodipine Swelling    Makes his ankles and legs swell.  . Codeine Other (See Comments)    Gives him a Headache.    Medications:  I have reviewed the patient's current medications. Prior to Admission:  Medications Prior to Admission  Medication Sig Dispense  Refill Last Dose  . albuterol (PROVENTIL HFA;VENTOLIN HFA) 108 (90 Base) MCG/ACT inhaler Inhale 2 puffs into the lungs every 6 (six) hours as needed for wheezing or shortness of breath.   Unknown at PRN  . aspirin EC 81 MG tablet Take 81 mg by mouth daily.   12/17/2018 at Unknown time  . Cholecalciferol 1000 units TBDP Take 1,000 Units by mouth daily.    12/17/2018 at Unknown time  . esomeprazole (NEXIUM) 40 MG capsule Take 40 mg by mouth daily at 12 noon.   12/17/2018 at Unknown time  . Fluticasone-Salmeterol (ADVAIR) 250-50 MCG/DOSE AEPB Inhale 1 puff into the lungs 2 (two) times daily.   12/17/2018 at Unknown time  . ibuprofen (ADVIL,MOTRIN) 200 MG tablet Take 600 mg by mouth every 4 (four) hours as needed.    Unknown at PRN  . losartan (COZAAR) 50 MG tablet Take 50 mg by mouth daily.   12/17/2018 at Unknown time  . mirtazapine (REMERON) 7.5 MG tablet Take 7.5 mg by mouth at bedtime.   12/17/2018 at Unknown time  . tiotropium (SPIRIVA) 18 MCG inhalation capsule Place 18 mcg into inhaler and inhale daily.   12/17/2018 at Unknown time  . vitamin B-12 (CYANOCOBALAMIN) 1000 MCG tablet Take 1,000 mcg by mouth daily.   12/17/2018 at Unknown time   Scheduled: . aspirin  EC  81 mg Oral Daily  . atorvastatin  40 mg Oral q1800  . docusate sodium  100 mg Oral BID  . enoxaparin (LOVENOX) injection  40 mg Subcutaneous Q24H  . losartan  50 mg Oral Daily  . pantoprazole  80 mg Oral Q1200  . umeclidinium-vilanterol  1 puff Inhalation Daily    ROS: History obtained from the patient  General ROS: negative for - chills, fatigue, fever, night sweats, weight gain or weight loss Psychological ROS: negative for - behavioral disorder, hallucinations, memory difficulties, mood swings or suicidal ideation Ophthalmic ROS: negative for - blurry vision, double vision, eye pain or loss of vision ENT ROS: negative for - epistaxis, nasal discharge, oral lesions, sore throat, tinnitus or vertigo Allergy and Immunology ROS: negative  for - hives or itchy/watery eyes Hematological and Lymphatic ROS: negative for - bleeding problems, bruising or swollen lymph nodes Endocrine ROS: negative for - galactorrhea, hair pattern changes, polydipsia/polyuria or temperature intolerance Respiratory ROS: negative for - cough, hemoptysis, shortness of breath or wheezing Cardiovascular ROS: negative for - chest pain, dyspnea on exertion, edema or irregular heartbeat Gastrointestinal ROS: negative for - abdominal pain, diarrhea, hematemesis, nausea/vomiting or stool incontinence Genito-Urinary ROS: negative for - dysuria, hematuria, incontinence or urinary frequency/urgency Musculoskeletal ROS: negative for - joint swelling or muscular weakness Neurological ROS: as noted in HPI Dermatological ROS: negative for rash and skin lesion changes  Physical Examination: Blood pressure (!) 161/97, pulse 62, temperature (!) 97.4 F (36.3 C), temperature source Oral, resp. rate 18, height 5\' 6"  (1.676 m), weight 83.9 kg, SpO2 96 %.  HEENT-  Normocephalic, no lesions, without obvious abnormality.  Normal external eye and conjunctiva.  Normal TM's bilaterally.  Normal auditory canals and external ears. Normal external nose, mucus membranes and septum.  Normal pharynx. Cardiovascular- S1, S2 normal, pulses palpable throughout   Lungs- chest clear, no wheezing, rales, normal symmetric air entry Abdomen- soft, non-tender; bowel sounds normal; no masses,  no organomegaly Extremities- no edema Lymph-no adenopathy palpable Musculoskeletal-no joint tenderness, deformity or swelling Skin-warm and dry, no hyperpigmentation, vitiligo, or suspicious lesions  Neurological Examination   Mental Status: Alert, oriented, thought content appropriate.  Speech fluent without evidence of aphasia.  Able to follow 3 step commands without difficulty. Cranial Nerves: II: Discs flat bilaterally; Visual fields grossly normal, pupils equal, round, reactive to light and  accommodation III,IV, VI: ptosis not present, extra-ocular motions intact bilaterally V,VII: smile symmetric, facial light touch sensation normal bilaterally VIII: HOH IX,X: gag reflex present XI: bilateral shoulder shrug XII: midline tongue extension Motor: Right : Upper extremity   5/5    Left:     Upper extremity   5/5  Lower extremity   5/5     Lower extremity   5/5 Tone and bulk:normal tone throughout; no atrophy noted Sensory: Pinprick and light touch intact throughout, bilaterally Deep Tendon Reflexes: 2+ and symmetric throughout Plantars: Right: mute   Left: mute Cerebellar: Normal finger-to-nose and normal heel-to-shin testing bilaterally Gait: normal gait and station    Laboratory Studies:  Basic Metabolic Panel: Recent Labs  Lab 12/17/18 2111  NA 140  K 3.2*  CL 107  CO2 25  GLUCOSE 107*  BUN 10  CREATININE 1.15  CALCIUM 8.5*    Liver Function Tests: Recent Labs  Lab 12/17/18 2111  AST 18  ALT 15  ALKPHOS 153*  BILITOT 1.1  PROT 7.3  ALBUMIN 4.1   No results for input(s): LIPASE, AMYLASE in the last 168 hours.  No results for input(s): AMMONIA in the last 168 hours.  CBC: Recent Labs  Lab 12/17/18 2111  WBC 7.9  NEUTROABS 4.6  HGB 14.2  HCT 39.9  MCV 81.3  PLT 241    Cardiac Enzymes: No results for input(s): CKTOTAL, CKMB, CKMBINDEX, TROPONINI in the last 168 hours.  BNP: Invalid input(s): POCBNP  CBG: Recent Labs  Lab 12/17/18 Jan 23, 2117  GLUCAP 102*    Microbiology: Results for orders placed or performed during the hospital encounter of 10/01/18  Group A Strep by PCR     Status: None   Collection Time: 09/30/18 11:58 PM  Result Value Ref Range Status   Group A Strep by PCR NOT DETECTED NOT DETECTED Final    Comment: Performed at [facility], [address] [place], [place] [address]  Blood culture (routine x 2)     Status: None   Collection Time: 10/01/18  2:58 AM  Result Value Ref Range Status   Specimen  Description BLOOD LEFT FOREARM  Final   Special Requests   Final    BOTTLES DRAWN AEROBIC AND ANAEROBIC Blood Culture results may not be optimal due to an excessive volume of blood received in culture bottles   Culture   Final    NO GROWTH 5 DAYS Performed at [facility], [address] [place], [place] [address]    Report Status 10/06/2018 FINAL  Final  Blood culture (routine x 2)     Status: None   Collection Time: 10/01/18  3:15 AM  Result Value Ref Range Status   Specimen Description BLOOD RIGHT ANTECUBITAL  Final   Special Requests   Final    BOTTLES DRAWN AEROBIC AND ANAEROBIC Blood Culture results may not be optimal due to an excessive volume of blood received in culture bottles   Culture   Final    NO GROWTH 5 DAYS Performed at [facility], [address] [place], [place] [address]    Report Status 10/06/2018 FINAL  Final    Coagulation Studies: Recent Labs    12/17/18 23-Jan-2110  LABPROT 13.4  INR 1.0    Urinalysis:  Recent Labs  Lab 12/17/18 2135/01/24  COLORURINE YELLOW*  LABSPEC 1.010  PHURINE 6.0  GLUCOSEU NEGATIVE  HGBUR NEGATIVE  BILIRUBINUR NEGATIVE  KETONESUR NEGATIVE  PROTEINUR NEGATIVE  NITRITE NEGATIVE  LEUKOCYTESUR NEGATIVE    Lipid Panel:    Component Value Date/Time   CHOL 139 12/17/2018 01-23-10   TRIG 78 12/17/2018 01/23/2110   HDL 36 (L) 12/17/2018 01-23-2110   CHOLHDL 3.9 12/17/2018 01-23-10   VLDL 16 12/17/2018 2110-01-23   LDLCALC 87 12/17/2018 2110-01-23    HgbA1C:  Lab Results  Component Value Date   HGBA1C 5.0 12/17/2018    Urine Drug Screen:      Component Value Date/Time   LABOPIA NONE DETECTED 12/17/2018 01-24-2135   COCAINSCRNUR NONE DETECTED 12/17/2018 January 24, 2135   LABBENZ NONE DETECTED 12/17/2018 01-24-2135   AMPHETMU NONE DETECTED 12/17/2018 01/24/35   THCU NONE DETECTED 12/17/2018 01-24-2135   LABBARB NONE DETECTED 12/17/2018 Jan 24, 2135    Alcohol Level:  Recent Labs  Lab 12/17/18 2110-01-23  ETH <10    Other results: EKG: sinus rhythm with sinus arhythmia  and occasional PVC's, 65 bpm.  Imaging: Mr Brain Wo Contrast  Result Date: 12/18/2018 CLINICAL DATA:  Confusion for 2 hours. Hypertensive encephalopathy, assess for stroke. History of hypertension, headache. EXAM: MRI HEAD WITHOUT CONTRAST TECHNIQUE: Multiplanar, multiecho pulse sequences of the brain and surrounding structures were obtained without  intravenous contrast. COMPARISON:  CT HEAD December 17, 2018 FINDINGS: INTRACRANIAL CONTENTS: Subcentimeter reduced diffusion LEFT mesial occipital lobe, low ADC values and possibly extra-axial. No susceptibility artifact to suggest hemorrhage. Mild-to-moderate parenchymal brain volume loss. No hydrocephalus. Patchy pontine, RIGHT brachium pontis and supratentorial white matter FLAIR T2 hyperintensities. Old RIGHT thalamus/corona radiata lacunar infarct. VASCULAR: Normal major intracranial vascular flow voids present at skull base. SKULL AND UPPER CERVICAL SPINE: No abnormal sellar expansion. No suspicious calvarial bone marrow signal. Craniocervical junction maintained. SINUSES/ORBITS: Minimal maxillary sinus mucosal thickening.The included ocular globes and orbital contents are non-suspicious. OTHER: None. IMPRESSION: 1. Acute subcentimeter LEFT occipital lobe/PCA territory infarct versus flow artifact. 2. Old RIGHT thalamus/corona radiata small infarct. Moderate chronic small vessel ischemic changes. 3. Mild-to-moderate parenchymal brain volume loss, advanced for age. Electronically Signed   By: Elon Alas M.D.   On: 12/18/2018 03:10   US Carotid Bilateral (at [facility] Only)  Result Date: 12/18/2018 CLINICAL DATA:  63 year old male with a history of stroke EXAM: BILATERAL CAROTID DUPLEX ULTRASOUND TECHNIQUE: Pearline Cables scale imaging, color Doppler and duplex ultrasound were performed of bilateral carotid and vertebral arteries in the neck. COMPARISON:  None. FINDINGS: Criteria: Quantification of carotid stenosis is based on velocity parameters that correlate  the residual internal carotid diameter with NASCET-based stenosis levels, using the diameter of the distal internal carotid lumen as the denominator for stenosis measurement. The following velocity measurements were obtained: RIGHT ICA:  Systolic 419 cm/sec, Diastolic 27 cm/sec CCA:  85 cm/sec SYSTOLIC ICA/CCA RATIO:  1.3 ECA:  111 cm/sec LEFT ICA:  Systolic 65 cm/sec, Diastolic 22 cm/sec CCA:  79 cm/sec SYSTOLIC ICA/CCA RATIO:  0.8 ECA:  125 cm/sec Right Brachial SBP: Not acquired Left Brachial SBP: Not acquired RIGHT CAROTID ARTERY: No significant calcifications of the right common carotid artery. Intermediate waveform maintained. Heterogeneous and partially calcified plaque at the right carotid bifurcation. No significant lumen shadowing. Low resistance waveform of the right ICA. No significant tortuosity. RIGHT VERTEBRAL ARTERY: Antegrade flow with low resistance waveform. LEFT CAROTID ARTERY: No significant calcifications of the left common carotid artery. Intermediate waveform maintained. Heterogeneous and partially calcified plaque at the left carotid bifurcation without significant lumen shadowing. Low resistance waveform of the left ICA. No significant tortuosity. LEFT VERTEBRAL ARTERY:  Antegrade flow with low resistance waveform. IMPRESSION: Color duplex indicates minimal heterogeneous and calcified plaque, with no hemodynamically significant stenosis by duplex criteria in the extracranial cerebrovascular circulation. Signed, Dulcy Fanny. Dellia Nims, RPVI [facility] Electronically Signed   By: Corrie Mckusick D.O.   On: 12/18/2018 11:05   Ct Head Code Stroke Wo Contrast  Result Date: 12/17/2018 CLINICAL DATA:  Code stroke. Focal neuro deficit for less than 6 hours. Stroke suspected. Confusion beginning 2 hours ago. EXAM: CT HEAD WITHOUT CONTRAST TECHNIQUE: Contiguous axial images were obtained from the base of the skull through the vertex without  intravenous contrast. COMPARISON:  CT head without contrast 09/15/2017 FINDINGS: Brain: Moderate atrophy and white matter disease is again seen bilaterally. A lacunar infarct involving the right centrum semi ovale is new since the prior study. This does not appear acute. Basal ganglia are otherwise intact. Insular ribbon is normal. No acute or focal cortical abnormality is present. Pineal calcification is again noted. The brainstem and cerebellum are within normal limits. Vascular: Atherosclerotic calcifications are present within the cavernous internal carotid arteries. There is no hyperdense vessel. Skull: Calvarium is intact. No focal lytic or blastic lesions are present. Sinuses/Orbits: The  paranasal sinuses and mastoid air cells are clear. The globes and orbits are within normal limits. ASPECTS [facility] Stroke Program Early CT Score) - Ganglionic level infarction (caudate, lentiform nuclei, internal capsule, insula, M1-M3 cortex): 7/7 - Supraganglionic infarction (M4-M6 cortex): 3/3 Total score (0-10 with 10 being normal): 10/10 IMPRESSION: 1. No acute intracranial abnormality. 2. 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 3. Stable atrophy and diffuse white matter disease 4. ASPECTS is 10/10 These results were called by telephone at the time of interpretation on 12/17/2018 at 9:38 pm to Dr. Carrie Mew , who verbally acknowledged these results. Electronically Signed   By: San Morelle M.D.   On: 12/17/2018 21:38    Assessment: 63 y.o. male presenting with episode of confusion, gait instability and headache.  Symptoms now resolved.  MRI of the brain reviewed and shows an area of acute infarction in the left PCA territory and subacute to chronic infarct in the right thalamus.  Both small.  Concern is for embolic etiology.  Patient on ASA at home.  Patient now back to baseline.   Carotid dopplers show no evidence of  hemodynamically significant stenosis.  Echocardiogram shows no cardiac source of emboli with an EF of 60-65%.  A1c 5.0, LDL 87.  Stroke Risk Factors - none  Plan: 1. PT consult, OT consult, Speech consult 2. Patient to be evaluated on an outpatient basis for TEE and prolonged cardiac monitoring if TEE unremarkable.   3. Prophylactic therapy-ASA 81mg  and Plavix 75mg  daily for one month before patient to go to monotherapy of Plavix 75mg  daily.   4. Telemetry monitoring 5. Frequent neuro checks 6. EEG.  May be performed as an outpatient.   7. Statin for lipid management with target LDL<70. 8. Patient to follow up with neurology on an outpatient basis.    Case discussed with Dr. Rolanda Lundborg, MD Neurology [phone] 12/18/2018, 2:06 PM

## 2018-12-18 NOTE — Progress Notes (Addendum)
OT Cancellation Note  Patient Details Name: Winfield Caba MRN: [id number] DOB: [birth year]   Cancelled Treatment:    Reason Eval/Treat Not Completed: Patient at procedure or test/ unavailable. Order received, chart reviewed. Pt out of room for testing. On 2nd attempt about an hour later, pt receiving an echocardiogram. Will re-attempt OT evaluation at later time as pt is available and medically appropriate.  Jeni Salles, MPH, MS, OTR/L ascom [phone] 12/18/18, 10:19 AM

## 2018-12-18 NOTE — Progress Notes (Signed)
Patient's cardiac monitor showed [facility] while patient walking with physical therapy.  Patient is not symptomatic.  Made Dr. Verdell Carmine aware and he stated that patient's echo is good and that patient can discharge.  Will make patient's nurse [facility] aware.  Clarise Cruz, BSN

## 2018-12-18 NOTE — ED Notes (Signed)
Report given to Phylis RN

## 2018-12-18 NOTE — ED Notes (Signed)
ED TO INPATIENT HANDOFF REPORT  ED Nurse Name and Phone #: Laren Boom [phone]  S Name/Age/Gender Para March 63 y.o. male Room/Bed: ED11A/ED11A  Code Status   Code Status: Prior  Home/SNF/Other Home Patient oriented to: self, place, time and situation Is this baseline? Yes   Triage Complete: Triage complete  Chief Complaint maybe stroke  Triage Note Patient last known well at 7pm. Had just left church and started to become confused. Didn't know anybody, sentences not making any sense, unbalanced and has a headache.    Allergies Allergies  Allergen Reactions  . Amlodipine Swelling    Makes his ankles and legs swell.  . Codeine Other (See Comments)    Gives him a Headache.    Level of Care/Admitting Diagnosis ED Disposition    ED Disposition Condition [facility] Area: [place] [100120]  Level of Care: Med-Surg [16]  Diagnosis: Stroke (cerebrum) [facility]) [[id number]]  Admitting Physician: Harrie Foreman [[id number]]  Attending Physician: Harrie Foreman [phone]  Bed request comments: 1C  PT Class (Do Not Modify): Observation [104]  PT Acc Code (Do Not Modify): Observation [10022]       B Medical/Surgery History Past Medical History:  Diagnosis Date  . Allergic state   . Arthritis   . Asthma   . B12 deficiency   . COPD (chronic obstructive pulmonary disease) ([place])   . Coronary artery disease   . Dysrhythmia   . Edema   . GERD (gastroesophageal reflux disease)   . Headache   . Palpitations   . Pulmonary nodules    Past Surgical History:  Procedure Laterality Date  . COLONOSCOPY    . COLONOSCOPY WITH PROPOFOL N/A 04/09/2016   Procedure: COLONOSCOPY WITH PROPOFOL;  Surgeon: Lollie Sails, MD;  Location: [facility] ENDOSCOPY;  Service: Endoscopy;  Laterality: N/A;     A IV Location/Drains/Wounds Patient Lines/Drains/Airways Status   Active Line/Drains/Airways    Name:   Placement date:   Placement time:    Site:   Days:   Peripheral IV 12/17/18 Right Antecubital   12/17/18    2125    Antecubital   1          Intake/Output Last 24 hours No intake or output data in the 24 hours ending 12/18/18 0845  Labs/Imaging Results for orders placed or performed during the hospital encounter of 12/17/18 (from the past 48 hour(s))  Ethanol     Status: None   Collection Time: 12/17/18  9:11 PM  Result Value Ref Range   Alcohol, Ethyl (B) <10 <10 mg/dL    Comment: (NOTE) Lowest detectable limit for serum alcohol is 10 mg/dL. For medical purposes only. Performed at [facility], [address] [place], [place] [address]   Protime-INR     Status: None   Collection Time: 12/17/18  9:11 PM  Result Value Ref Range   Prothrombin Time 13.4 11.4 - 15.2 seconds   INR 1.0 0.8 - 1.2    Comment: (NOTE) INR goal varies based on device and disease states. Performed at [facility], [address] [place], [place] [address]   APTT     Status: None   Collection Time: 12/17/18  9:11 PM  Result Value Ref Range   aPTT 30 24 - 36 seconds    Comment: Performed at [facility], [address] [place], [place] [address]  CBC     Status: None   Collection Time: 12/17/18  9:11 PM  Result Value Ref Range   WBC 7.9 4.0 - 10.5 K/uL   RBC 4.91 4.22 - 5.81 MIL/uL   Hemoglobin 14.2 13.0 - 17.0 g/dL   HCT 39.9 39.0 - 52.0 %   MCV 81.3 80.0 - 100.0 fL   MCH 28.9 26.0 - 34.0 pg   MCHC 35.6 30.0 - 36.0 g/dL   RDW 12.3 11.5 - 15.5 %   Platelets 241 150 - 400 K/uL   nRBC 0.0 0.0 - 0.2 %    Comment: Performed at [facility], [address] [place], [place] [address]  Differential     Status: None   Collection Time: 12/17/18  9:11 PM  Result Value Ref Range   Neutrophils Relative % 58 %   Neutro Abs 4.6 1.7 - 7.7 K/uL   Lymphocytes Relative 29 %   Lymphs Abs 2.3 0.7 - 4.0 K/uL   Monocytes Relative 7 %   Monocytes Absolute 0.6 0.1 - 1.0 K/uL   Eosinophils Relative 5 %    Eosinophils Absolute 0.4 0.0 - 0.5 K/uL   Basophils Relative 1 %   Basophils Absolute 0.1 0.0 - 0.1 K/uL   Immature Granulocytes 0 %   Abs Immature Granulocytes 0.02 0.00 - 0.07 K/uL    Comment: Performed at [facility], [address] [place], [place] [address]  Comprehensive metabolic panel     Status: Abnormal   Collection Time: 12/17/18  9:11 PM  Result Value Ref Range   Sodium 140 135 - 145 mmol/L   Potassium 3.2 (L) 3.5 - 5.1 mmol/L   Chloride 107 98 - 111 mmol/L   CO2 25 22 - 32 mmol/L   Glucose, Bld 107 (H) 70 - 99 mg/dL   BUN 10 8 - 23 mg/dL   Creatinine, Ser 1.15 0.61 - 1.24 mg/dL   Calcium 8.5 (L) 8.9 - 10.3 mg/dL   Total Protein 7.3 6.5 - 8.1 g/dL   Albumin 4.1 3.5 - 5.0 g/dL   AST 18 15 - 41 U/L   ALT 15 0 - 44 U/L   Alkaline Phosphatase 153 (H) 38 - 126 U/L   Total Bilirubin 1.1 0.3 - 1.2 mg/dL   GFR calc non Af Amer >60 >60 mL/min   GFR calc Af Amer >60 >60 mL/min   Anion gap 8 5 - 15    Comment: Performed at [facility], [address] [place], [place] [address]  Glucose, capillary     Status: Abnormal   Collection Time: 12/17/18  9:18 PM  Result Value Ref Range   Glucose-Capillary 102 (H) 70 - 99 mg/dL  Urine Drug Screen, Qualitative     Status: None   Collection Time: 12/17/18  9:36 PM  Result Value Ref Range   Tricyclic, Ur Screen NONE DETECTED NONE DETECTED   Amphetamines, Ur Screen NONE DETECTED NONE DETECTED   MDMA (Ecstasy)Ur Screen NONE DETECTED NONE DETECTED   Cocaine Metabolite,Ur [place] NONE DETECTED NONE DETECTED   Opiate, Ur Screen NONE DETECTED NONE DETECTED   Phencyclidine (PCP) Ur S NONE DETECTED NONE DETECTED   Cannabinoid 50 Ng, Ur [place] NONE DETECTED NONE DETECTED   Barbiturates, Ur Screen NONE DETECTED NONE DETECTED   Benzodiazepine, Ur Scrn NONE DETECTED NONE DETECTED   Methadone Scn, Ur NONE DETECTED NONE DETECTED    Comment: (NOTE) Tricyclics + metabolites, urine    Cutoff 1000 ng/mL Amphetamines + metabolites,  urine  Cutoff 1000 ng/mL MDMA (Ecstasy), urine  Cutoff 500 ng/mL Cocaine Metabolite, urine          Cutoff 300 ng/mL Opiate + metabolites, urine        Cutoff 300 ng/mL Phencyclidine (PCP), urine         Cutoff 25 ng/mL Cannabinoid, urine                 Cutoff 50 ng/mL Barbiturates + metabolites, urine  Cutoff 200 ng/mL Benzodiazepine, urine              Cutoff 200 ng/mL Methadone, urine                   Cutoff 300 ng/mL The urine drug screen provides only a preliminary, unconfirmed analytical test result and should not be used for non-medical purposes. Clinical consideration and professional judgment should be applied to any positive drug screen result due to possible interfering substances. A more specific alternate chemical method must be used in order to obtain a confirmed analytical result. Gas chromatography / mass spectrometry (GC/MS) is the preferred confirmat ory method. Performed at [facility], [address] [place], [place] [address]   Urinalysis, Complete w Microscopic     Status: Abnormal   Collection Time: 12/17/18  9:36 PM  Result Value Ref Range   Color, Urine YELLOW (A) YELLOW   APPearance CLEAR (A) CLEAR   Specific Gravity, Urine 1.010 1.005 - 1.030   pH 6.0 5.0 - 8.0   Glucose, UA NEGATIVE NEGATIVE mg/dL   Hgb urine dipstick NEGATIVE NEGATIVE   Bilirubin Urine NEGATIVE NEGATIVE   Ketones, ur NEGATIVE NEGATIVE mg/dL   Protein, ur NEGATIVE NEGATIVE mg/dL   Nitrite NEGATIVE NEGATIVE   Leukocytes,Ua NEGATIVE NEGATIVE   RBC / HPF 0-5 0 - 5 RBC/hpf   WBC, UA 0-5 0 - 5 WBC/hpf   Bacteria, UA NONE SEEN NONE SEEN   Squamous Epithelial / LPF NONE SEEN 0 - 5    Comment: Performed at [facility], [address] [place], [place] [address]   Mr Brain Wo Contrast  Result Date: 12/18/2018 CLINICAL DATA:  Confusion for 2 hours. Hypertensive encephalopathy, assess for stroke. History of hypertension, headache. EXAM: MRI HEAD WITHOUT  CONTRAST TECHNIQUE: Multiplanar, multiecho pulse sequences of the brain and surrounding structures were obtained without intravenous contrast. COMPARISON:  CT HEAD December 17, 2018 FINDINGS: INTRACRANIAL CONTENTS: Subcentimeter reduced diffusion LEFT mesial occipital lobe, low ADC values and possibly extra-axial. No susceptibility artifact to suggest hemorrhage. Mild-to-moderate parenchymal brain volume loss. No hydrocephalus. Patchy pontine, RIGHT brachium pontis and supratentorial white matter FLAIR T2 hyperintensities. Old RIGHT thalamus/corona radiata lacunar infarct. VASCULAR: Normal major intracranial vascular flow voids present at skull base. SKULL AND UPPER CERVICAL SPINE: No abnormal sellar expansion. No suspicious calvarial bone marrow signal. Craniocervical junction maintained. SINUSES/ORBITS: Minimal maxillary sinus mucosal thickening.The included ocular globes and orbital contents are non-suspicious. OTHER: None. IMPRESSION: 1. Acute subcentimeter LEFT occipital lobe/PCA territory infarct versus flow artifact. 2. Old RIGHT thalamus/corona radiata small infarct. Moderate chronic small vessel ischemic changes. 3. Mild-to-moderate parenchymal brain volume loss, advanced for age. Electronically Signed   By: Elon Alas M.D.   On: 12/18/2018 03:10   Ct Head Code Stroke Wo Contrast  Result Date: 12/17/2018 CLINICAL DATA:  Code stroke. Focal neuro deficit for less than 6 hours. Stroke suspected. Confusion beginning 2 hours ago. EXAM: CT HEAD WITHOUT CONTRAST TECHNIQUE: Contiguous axial images were obtained from the base of the skull through the vertex without intravenous contrast. COMPARISON:  CT head without contrast 09/15/2017 FINDINGS: Brain: Moderate atrophy and white matter disease is again seen bilaterally. A lacunar infarct involving the right centrum semi ovale is new since the prior study. This does not appear acute. Basal ganglia are otherwise intact. Insular ribbon is normal. No acute or focal  cortical abnormality is present. Pineal calcification is again noted. The brainstem and cerebellum are within normal limits. Vascular: Atherosclerotic calcifications are present within the cavernous internal carotid arteries. There is no hyperdense vessel. Skull: Calvarium is intact. No focal lytic or blastic lesions are present. Sinuses/Orbits: The paranasal sinuses and mastoid air cells are clear. The globes and orbits are within normal limits. ASPECTS [facility] Stroke Program Early CT Score) - Ganglionic level infarction (caudate, lentiform nuclei, internal capsule, insula, M1-M3 cortex): 7/7 - Supraganglionic infarction (M4-M6 cortex): 3/3 Total score (0-10 with 10 being normal): 10/10 IMPRESSION: 1. No acute intracranial abnormality. 2. 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 3. Stable atrophy and diffuse white matter disease 4. ASPECTS is 10/10 These results were called by telephone at the time of interpretation on 12/17/2018 at 9:38 pm to Dr. Carrie Mew , who verbally acknowledged these results. Electronically Signed   By: [place] M.D.   On: 12/17/2018 21:38    Pending Labs [employer] (From admission, onward)    Start     Ordered   Signed and Held  TSH  Add-on,   R     Signed and Held   Signed and Held  Hemoglobin A1c  Tomorrow morning,   R     Signed and Held   Signed and Held  Lipid panel  Tomorrow morning,   R    Comments:  Fasting    Signed and Held          Vitals/Pain Today's Vitals   12/18/18 0438 12/18/18 0500 12/18/18 0530 12/18/18 0632  BP:  (!) 153/90 (!) 157/85 (!) 167/90  Pulse: 64 (!) 58 (!) 57 (!) 51  Resp: 18   18  Temp:      TempSrc:      SpO2:  96% 95% 96%  Weight:      Height:      PainSc:        Isolation Precautions No active isolations  Medications Medications  acetaminophen (TYLENOL) tablet 650 mg (650 mg Oral Given 12/18/18 0518)    Or  acetaminophen  (TYLENOL) suppository 650 mg ( Rectal See Alternative 12/18/18 0518)  aspirin chewable tablet 324 mg (324 mg Oral Given 12/18/18 0040)    Mobility walks Moderate fall risk   Focused Assessments Neuro Assessment Handoff:  Swallow screen pass? Yes    NIH Stroke Scale ( + Modified Stroke Scale Criteria)  LOC Questions (1b. )   +: Answers both questions correctly LOC Commands (1c. )   + : Performs both tasks correctly Best Gaze (2. )  +: Normal Visual (3. )  +: No visual loss Motor Arm, Left (5a. )   +: No drift Motor Arm, Right (5b. )   +: No drift Motor Leg, Left (6a. )   +: No drift Motor Leg, Right (6b. )   +: No drift Sensory (8. )   +: Normal, no sensory loss Best Language (9. )   +: No aphasia Extinction/Inattention (11.)   +: No Abnormality Modified SS Total  +: 0 Last date known well: 12/17/18 Last time known well: 1900 Neuro Assessment:  Within Defined Limits Neuro Checks:      Last Documented NIHSS Modified Score: 0 (12/17/18 2135) Has TPA been given? No If patient is a Neuro Trauma and patient is going to OR before floor call report to [place] nurse: [phone] or [phone]     R Recommendations: See Admitting Provider Note  Report given to:   Additional Notes:  Arrived to ED with inability to remember daughter and grandchilds name. Pt has returned to baseline

## 2018-12-18 NOTE — Evaluation (Signed)
Physical Therapy Evaluation Patient Details Name: Derrick Sheppard MRN: [id number] DOB: [birth year] Today's Date: 12/18/2018   History of Present Illness   63 y.o. male with a history of GERD, CAD, COPD, migraines is brought to the ED due to confusion and aphasia. MRI showed a subcentimeter left occipital lobe infarct with questionable flow artifact.  The patient also has right thalamic and corona radiata infarct with mild to moderate small vessel ischemia.    Clinical Impression  Patient pleasant, alert, oriented, behavior WFLs and able to follow all multistep commands without difficulty. Pt reported at baseline he was independent in ADLs/IADLs/ambulation and lives with his wife who is available 24/7. Patient denies any recent falls.  Pt denied any pain at this time, he did report mild chest tightness and utilized his inhaler prior to ambulation, RN notified. During ambulation patient HR monitored by PT via telemetry box, noted ventricular tachycardia alerts and abnormal QRS waves. Nearby RN notified, and monitored HR on telemetry with ambulation to return to room, patient asymptomatic. HR flucuated from high 80s-120s during mobility. Upon assessment patient exhibited coordination and strength WFLs. Patient demonstrated bed mobility and transfers with independence. Ambulated ~74ft and limited by HR. Patient mildly SOB at end of mobility. RN notified and planning on calling physician at end of session to assess HR abnormalities. The patient would benefit from further skilled PT to address limitations in endurance and activity tolerance, outpatient PT recommended if appropriate per physician.     Follow Up Recommendations Outpatient PT    Equipment Recommendations  None recommended by PT    Recommendations for Other Services       Precautions / Restrictions Precautions Precautions: None Restrictions Weight Bearing Restrictions: No      Mobility  Bed Mobility Overal bed mobility:  Independent                Transfers Overall transfer level: Independent                  Ambulation/Gait Ambulation/Gait assistance: Supervision Gait Distance (Feet): 110 Feet Assistive device: None Gait Pattern/deviations: WFL(Within Functional Limits)     General Gait Details: Pt able to slow gait speed and increase gait speed with no LOB, stable throughout, mild SOB noted.  Stairs            Wheelchair Mobility    Modified Rankin (Stroke Patients Only)       Balance Overall balance assessment: Modified Independent                                           Pertinent Vitals/Pain Pain Assessment: No/denies pain    Home Living Family/patient expects to be discharged to:: Private residence Living Arrangements: Spouse/significant other Available Help at Discharge: Available 24 hours/day Type of Home: House Home Access: Stairs to enter Entrance Stairs-Rails: Can reach both Entrance Stairs-Number of Steps: 4 ( one rail in the back) Home Layout: One level Home Equipment: Walker - 4 wheels      Prior Function Level of Independence: Independent         Comments: Drives, goes to church, independent     Hand Dominance   Dominant Hand: Right    Extremity/Trunk Assessment   Upper Extremity Assessment Upper Extremity Assessment: Overall WFL for tasks assessed    Lower Extremity Assessment Lower Extremity Assessment: Overall WFL for tasks assessed;RLE deficits/detail;LLE deficits/detail RLE Deficits /  Details: grossly 4+/5 RLE Sensation: WNL RLE Coordination: WNL LLE Deficits / Details: grossly 4+/5 LLE Sensation: WNL LLE Coordination: WNL       Communication   Communication: No difficulties  Cognition Arousal/Alertness: Awake/alert Behavior During Therapy: WFL for tasks assessed/performed Overall Cognitive Status: Within Functional Limits for tasks assessed                                         General Comments      Exercises     Assessment/Plan    PT Assessment Patient needs continued PT services  PT Problem List Decreased activity tolerance;Cardiopulmonary status limiting activity       PT Treatment Interventions DME instruction;Therapeutic exercise;Gait training;Balance training;Stair training;Neuromuscular re-education;Functional mobility training;Therapeutic activities;Patient/family education    PT Goals (Current goals can be found in the Care Plan section)  Acute Rehab PT Goals Patient Stated Goal: to go home PT Goal Formulation: With patient Time For Goal Achievement: 01/01/19 Potential to Achieve Goals: Good Additional Goals Additional Goal #1: The patient will demonstrate higher level balance activities such as the DGI and score >19 to demonstrate decreased risk of falls.    Frequency 7X/week   Barriers to discharge        Co-evaluation               AM-PAC PT "6 Clicks" Mobility  Outcome Measure Help needed turning from your back to your side while in a flat bed without using bedrails?: None Help needed moving from lying on your back to sitting on the side of a flat bed without using bedrails?: None Help needed moving to and from a bed to a chair (including a wheelchair)?: None Help needed standing up from a chair using your arms (e.g., wheelchair or bedside chair)?: None Help needed to walk in hospital room?: A Little Help needed climbing 3-5 steps with a railing? : A Little 6 Click Score: 22    End of Session Equipment Utilized During Treatment: Gait belt Activity Tolerance: Other (comment);Treatment limited secondary to medical complications (Comment)(HR) Patient left: in chair;with family/visitor present;with call bell/phone within reach;with chair alarm set Nurse Communication: Mobility status PT Visit Diagnosis: Difficulty in walking, not elsewhere classified (R26.2)    Time: 9532-0233 PT Time Calculation (min) (ACUTE ONLY): 32  min   Charges:   PT Evaluation $PT Eval Low Complexity: 1 Low PT Treatments $Therapeutic Activity: 8-22 mins        Lieutenant Diego PT, DPT 1:41 PM,12/18/18 [phone]

## 2018-12-19 NOTE — Discharge Summary (Signed)
[name] NAME: Derrick Sheppard    MR#:  [id number]  DATE OF BIRTH:  September 22, 1956  DATE OF ADMISSION:  12/17/2018 ADMITTING PHYSICIAN: Harrie Foreman, MD  DATE OF DISCHARGE: 12/18/2018  3:57 PM  PRIMARY CARE PHYSICIAN: Adin Hector, MD    ADMISSION DIAGNOSIS:  Hypertensive encephalopathy [I67.4]  DISCHARGE DIAGNOSIS:  Active Problems:   Stroke (cerebrum) ([place])   SECONDARY DIAGNOSIS:   Past Medical History:  Diagnosis Date  . Allergic state   . Arthritis   . Asthma   . B12 deficiency   . COPD (chronic obstructive pulmonary disease) ([place])   . Coronary artery disease   . Dysrhythmia   . Edema   . GERD (gastroesophageal reflux disease)   . Headache   . Palpitations   . Pulmonary nodules     HOSPITAL COURSE:   63 year old male with past medical history of COPD, hypertension, coronary artery disease, osteoarthritis, GERD who presented to the hospital due to aphasia and noted to have an acute CVA.  1.  Acute CVA-this was the cause of patient's aphasia.  Patient's MRI was positive for a left occipital/PCA territory CVA. -Patient's carotid duplex were negative for acute pathology, echocardiogram showed no acute abnormalities with normal LV function and no evidence of thrombus.  Patient's neurologic exam is benign and he has no current symptoms.  Patient was seen by neurology who recommended starting dual antiplatelet therapy and patient was discharged on aspirin and Plavix.  Patient was also started on atorvastatin. -Neurology recommended outpatient TEE and also EEG which is to be arranged through his primary care physician and cardiologist.  2.  Essential hypertension- pt. Will continue losartan.  3.  COPD-no acute exacerbation.    She will resume her Advair, Spiriva and albuterol inhaler as needed.  4.  GERD- pt. Will continue nexium.   DISCHARGE CONDITIONS:   Stable.   CONSULTS OBTAINED:  Treatment Team:   Catarina Hartshorn, MD  DRUG ALLERGIES:   Allergies  Allergen Reactions  . Amlodipine Swelling    Makes his ankles and legs swell.  . Codeine Other (See Comments)    Gives him a Headache.    DISCHARGE MEDICATIONS:   Allergies as of 12/18/2018      Reactions   Amlodipine Swelling   Makes his ankles and legs swell.   Codeine Other (See Comments)   Gives him a Headache.      Medication List    TAKE these medications   albuterol 108 (90 Base) MCG/ACT inhaler Commonly known as:  PROVENTIL HFA;VENTOLIN HFA Inhale 2 puffs into the lungs every 6 (six) hours as needed for wheezing or shortness of breath.   aspirin EC 81 MG tablet Take 81 mg by mouth daily.   atorvastatin 40 MG tablet Commonly known as:  LIPITOR Take 1 tablet (40 mg total) by mouth daily at 6 PM.   Cholecalciferol 25 MCG (1000 UT) Tbdp Take 1,000 Units by mouth daily.   clopidogrel 75 MG tablet Commonly known as:  PLAVIX Take 1 tablet (75 mg total) by mouth daily.   esomeprazole 40 MG capsule Commonly known as:  NEXIUM Take 40 mg by mouth daily at 12 noon.   Fluticasone-Salmeterol 250-50 MCG/DOSE Aepb Commonly known as:  ADVAIR Inhale 1 puff into the lungs 2 (two) times daily.   ibuprofen 200 MG tablet Commonly known as:  ADVIL,MOTRIN Take 600 mg by mouth every 4 (four) hours as needed.   losartan  50 MG tablet Commonly known as:  COZAAR Take 50 mg by mouth daily.   mirtazapine 7.5 MG tablet Commonly known as:  REMERON Take 7.5 mg by mouth at bedtime.   tiotropium 18 MCG inhalation capsule Commonly known as:  SPIRIVA Place 18 mcg into inhaler and inhale daily.   vitamin B-12 1000 MCG tablet Commonly known as:  CYANOCOBALAMIN Take 1,000 mcg by mouth daily.         DISCHARGE INSTRUCTIONS:   DIET:  Cardiac diet  DISCHARGE CONDITION:  Stable  ACTIVITY:  Activity as tolerated  OXYGEN:  Home Oxygen: No.   Oxygen Delivery: room air  DISCHARGE LOCATION:  home   If you  experience worsening of your admission symptoms, develop shortness of breath, life threatening emergency, suicidal or homicidal thoughts you must seek medical attention immediately by calling 911 or calling your MD immediately  if symptoms less severe.  You Must read complete instructions/literature along with all the possible adverse reactions/side effects for all the Medicines you take and that have been prescribed to you. Take any new Medicines after you have completely understood and accpet all the possible adverse reactions/side effects.   Please note  You were cared for by a hospitalist during your hospital stay. If you have any questions about your discharge medications or the care you received while you were in the hospital after you are discharged, you can call the unit and asked to speak with the hospitalist on call if the hospitalist that took care of you is not available. Once you are discharged, your primary care physician will handle any further medical issues. Please note that NO REFILLS for any discharge medications will be authorized once you are discharged, as it is imperative that you return to your primary care physician (or establish a relationship with a primary care physician if you do not have one) for your aftercare needs so that they can reassess your need for medications and monitor your lab values.   DATA REVIEW:   CBC Recent Labs  Lab 12/17/18 2111  WBC 7.9  HGB 14.2  HCT 39.9  PLT 241    Chemistries  Recent Labs  Lab 12/17/18 2111  NA 140  K 3.2*  CL 107  CO2 25  GLUCOSE 107*  BUN 10  CREATININE 1.15  CALCIUM 8.5*  AST 18  ALT 15  ALKPHOS 153*  BILITOT 1.1    Cardiac Enzymes No results for input(s): TROPONINI in the last 168 hours.   RADIOLOGY:  Mr Brain Wo Contrast  Result Date: 12/18/2018 CLINICAL DATA:  Confusion for 2 hours. Hypertensive encephalopathy, assess for stroke. History of hypertension, headache. EXAM: MRI HEAD WITHOUT CONTRAST  TECHNIQUE: Multiplanar, multiecho pulse sequences of the brain and surrounding structures were obtained without intravenous contrast. COMPARISON:  CT HEAD December 17, 2018 FINDINGS: INTRACRANIAL CONTENTS: Subcentimeter reduced diffusion LEFT mesial occipital lobe, low ADC values and possibly extra-axial. No susceptibility artifact to suggest hemorrhage. Mild-to-moderate parenchymal brain volume loss. No hydrocephalus. Patchy pontine, RIGHT brachium pontis and supratentorial white matter FLAIR T2 hyperintensities. Old RIGHT thalamus/corona radiata lacunar infarct. VASCULAR: Normal major intracranial vascular flow voids present at skull base. SKULL AND UPPER CERVICAL SPINE: No abnormal sellar expansion. No suspicious calvarial bone marrow signal. Craniocervical junction maintained. SINUSES/ORBITS: Minimal maxillary sinus mucosal thickening.The included ocular globes and orbital contents are non-suspicious. OTHER: None. IMPRESSION: 1. Acute subcentimeter LEFT occipital lobe/PCA territory infarct versus flow artifact. 2. Old RIGHT thalamus/corona radiata small infarct. Moderate chronic small vessel ischemic changes.  3. Mild-to-moderate parenchymal brain volume loss, advanced for age. Electronically Signed   By: Elon Alas M.D.   On: 12/18/2018 03:10   US Carotid Bilateral (at [facility] Only)  Result Date: 12/18/2018 CLINICAL DATA:  63 year old male with a history of stroke EXAM: BILATERAL CAROTID DUPLEX ULTRASOUND TECHNIQUE: Pearline Cables scale imaging, color Doppler and duplex ultrasound were performed of bilateral carotid and vertebral arteries in the neck. COMPARISON:  None. FINDINGS: Criteria: Quantification of carotid stenosis is based on velocity parameters that correlate the residual internal carotid diameter with NASCET-based stenosis levels, using the diameter of the distal internal carotid lumen as the denominator for stenosis measurement. The following velocity measurements were obtained: RIGHT ICA:  Systolic  062 cm/sec, Diastolic 27 cm/sec CCA:  85 cm/sec SYSTOLIC ICA/CCA RATIO:  1.3 ECA:  111 cm/sec LEFT ICA:  Systolic 65 cm/sec, Diastolic 22 cm/sec CCA:  79 cm/sec SYSTOLIC ICA/CCA RATIO:  0.8 ECA:  125 cm/sec Right Brachial SBP: Not acquired Left Brachial SBP: Not acquired RIGHT CAROTID ARTERY: No significant calcifications of the right common carotid artery. Intermediate waveform maintained. Heterogeneous and partially calcified plaque at the right carotid bifurcation. No significant lumen shadowing. Low resistance waveform of the right ICA. No significant tortuosity. RIGHT VERTEBRAL ARTERY: Antegrade flow with low resistance waveform. LEFT CAROTID ARTERY: No significant calcifications of the left common carotid artery. Intermediate waveform maintained. Heterogeneous and partially calcified plaque at the left carotid bifurcation without significant lumen shadowing. Low resistance waveform of the left ICA. No significant tortuosity. LEFT VERTEBRAL ARTERY:  Antegrade flow with low resistance waveform. IMPRESSION: Color duplex indicates minimal heterogeneous and calcified plaque, with no hemodynamically significant stenosis by duplex criteria in the extracranial cerebrovascular circulation. Signed, Dulcy Fanny. Dellia Nims, RPVI [facility] Radiology Electronically Signed   By: Corrie Mckusick D.O.   On: 12/18/2018 11:05   Ct Head Code Stroke Wo Contrast  Result Date: 12/17/2018 CLINICAL DATA:  Code stroke. Focal neuro deficit for less than 6 hours. Stroke suspected. Confusion beginning 2 hours ago. EXAM: CT HEAD WITHOUT CONTRAST TECHNIQUE: Contiguous axial images were obtained from the base of the skull through the vertex without intravenous contrast. COMPARISON:  CT head without contrast 09/15/2017 FINDINGS: Brain: Moderate atrophy and white matter disease is again seen bilaterally. A lacunar infarct involving the right centrum semi ovale is new since the prior study. This  does not appear acute. Basal ganglia are otherwise intact. Insular ribbon is normal. No acute or focal cortical abnormality is present. Pineal calcification is again noted. The brainstem and cerebellum are within normal limits. Vascular: Atherosclerotic calcifications are present within the cavernous internal carotid arteries. There is no hyperdense vessel. Skull: Calvarium is intact. No focal lytic or blastic lesions are present. Sinuses/Orbits: The paranasal sinuses and mastoid air cells are clear. The globes and orbits are within normal limits. ASPECTS [facility] Stroke Program Early CT Score) - Ganglionic level infarction (caudate, lentiform nuclei, internal capsule, insula, M1-M3 cortex): 7/7 - Supraganglionic infarction (M4-M6 cortex): 3/3 Total score (0-10 with 10 being normal): 10/10 IMPRESSION: 1. No acute intracranial abnormality. 2. 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 3. Stable atrophy and diffuse white matter disease 4. ASPECTS is 10/10 These results were called by telephone at the time of interpretation on 12/17/2018 at 9:38 pm to Dr. Carrie Mew , who verbally acknowledged these results. Electronically Signed   By: San Morelle M.D.   On:  12/17/2018 21:38      Management plans discussed with the patient, family and they are in agreement.  CODE STATUS:  Code Status History    Date Active Date Inactive Code Status Order ID Comments User Context   12/18/2018 0947 12/18/2018 1938 Full Code [id number]  Harrie Foreman, MD Inpatient   TOTAL TIME TAKING CARE OF THIS PATIENT: 40 minutes.    Henreitta Leber M.D on 12/19/2018 at 3:48 PM  Between 7am to 6pm - Pager - [phone]  After 6pm go to [email or web address] - Proofreader  [employer]  Hospitalists  Office  [phone]  CC: Primary care physician; Adin Hector, MD

## 2019-01-22 ENCOUNTER — Other Ambulatory Visit: Payer: Self-pay | Admitting: Internal Medicine

## 2019-01-22 DIAGNOSIS — R748 Abnormal levels of other serum enzymes: Secondary | ICD-10-CM

## 2019-01-23 ENCOUNTER — Other Ambulatory Visit: Payer: Self-pay

## 2019-01-23 ENCOUNTER — Ambulatory Visit
Admission: RE | Admit: 2019-01-23 | Discharge: 2019-01-23 | Disposition: A | Discharge status: Home / Self Care | Payer: BLUE CROSS/BLUE SHIELD | Source: Ambulatory Visit | Attending: Internal Medicine | Admitting: Internal Medicine

## 2019-01-23 DIAGNOSIS — R748 Abnormal levels of other serum enzymes: Secondary | ICD-10-CM

## 2019-02-01 ENCOUNTER — Other Ambulatory Visit: Payer: Self-pay | Admitting: Gastroenterology

## 2019-02-01 DIAGNOSIS — R748 Abnormal levels of other serum enzymes: Secondary | ICD-10-CM

## 2019-02-06 ENCOUNTER — Other Ambulatory Visit: Payer: Self-pay

## 2019-02-06 ENCOUNTER — Ambulatory Visit
Admission: RE | Admit: 2019-02-06 | Discharge: 2019-02-06 | Disposition: A | Discharge status: Home / Self Care | Payer: BLUE CROSS/BLUE SHIELD | Source: Ambulatory Visit | Attending: Gastroenterology | Admitting: Gastroenterology

## 2019-02-06 DIAGNOSIS — R748 Abnormal levels of other serum enzymes: Secondary | ICD-10-CM | POA: Insufficient documentation

## 2019-02-06 HISTORY — DX: Essential (primary) hypertension: I10

## 2019-02-06 MED ORDER — IOPAMIDOL (ISOVUE-300) INJECTION 61%
100.0000 mL | Freq: Once | INTRAVENOUS | Status: AC | PRN
  Administered 2019-02-06: 100 mL via INTRAVENOUS

## 2019-02-12 DIAGNOSIS — I6523 Occlusion and stenosis of bilateral carotid arteries: Secondary | ICD-10-CM | POA: Insufficient documentation

## 2019-02-16 ENCOUNTER — Ambulatory Visit
Admission: RE | Admit: 2019-02-16 | Discharge: 2019-02-16 | Disposition: A | Discharge status: Home / Self Care | Payer: BLUE CROSS/BLUE SHIELD | Source: Ambulatory Visit | Attending: Internal Medicine | Admitting: Internal Medicine

## 2019-02-16 ENCOUNTER — Other Ambulatory Visit: Payer: Self-pay

## 2019-02-16 DIAGNOSIS — Z1159 Encounter for screening for other viral diseases: Secondary | ICD-10-CM | POA: Diagnosis not present

## 2019-02-17 LAB — NOVEL CORONAVIRUS, NAA (HOSP ORDER, SEND-OUT TO REF LAB; TAT 18-24 HRS): SARS-CoV-2, NAA: NOT DETECTED

## 2019-02-21 ENCOUNTER — Ambulatory Visit
Admission: RE | Admit: 2019-02-21 | Discharge: 2019-02-21 | Disposition: A | Discharge status: Home / Self Care | Payer: BLUE CROSS/BLUE SHIELD | Source: Home / Self Care | Attending: Internal Medicine | Admitting: Internal Medicine

## 2019-02-21 ENCOUNTER — Other Ambulatory Visit: Payer: Self-pay

## 2019-02-21 ENCOUNTER — Ambulatory Visit
Admission: RE | Admit: 2019-02-21 | Discharge: 2019-02-21 | Disposition: A | Discharge status: Home / Self Care | Payer: BLUE CROSS/BLUE SHIELD | Attending: Internal Medicine | Admitting: Internal Medicine

## 2019-02-21 ENCOUNTER — Encounter
Admission: RE | Disposition: A | Discharge status: Home / Self Care | Payer: Self-pay | Source: Home / Self Care | Attending: Internal Medicine

## 2019-02-21 DIAGNOSIS — I639 Cerebral infarction, unspecified: Secondary | ICD-10-CM | POA: Insufficient documentation

## 2019-02-21 DIAGNOSIS — Q211 Atrial septal defect: Secondary | ICD-10-CM | POA: Diagnosis not present

## 2019-02-21 DIAGNOSIS — I10 Essential (primary) hypertension: Secondary | ICD-10-CM | POA: Diagnosis not present

## 2019-02-21 HISTORY — PX: TEE WITHOUT CARDIOVERSION: SHX5443

## 2019-02-21 SURGERY — ECHOCARDIOGRAM, TRANSESOPHAGEAL
Anesthesia: Moderate Sedation

## 2019-02-21 MED ORDER — BUTAMBEN-TETRACAINE-BENZOCAINE 2-2-14 % EX AERO
INHALATION_SPRAY | CUTANEOUS | Status: AC
  Administered 2019-02-21: 3
  Filled 2019-02-21: qty 5

## 2019-02-21 MED ORDER — FENTANYL CITRATE (PF) 100 MCG/2ML IJ SOLN
INTRAMUSCULAR | Status: AC
  Filled 2019-02-21: qty 4

## 2019-02-21 MED ORDER — SODIUM CHLORIDE FLUSH 0.9 % IV SOLN
INTRAVENOUS | Status: AC
  Filled 2019-02-21: qty 10

## 2019-02-21 MED ORDER — LIDOCAINE VISCOUS HCL 2 % MT SOLN
OROMUCOSAL | Status: AC
  Administered 2019-02-21: 15 mL
  Filled 2019-02-21: qty 15

## 2019-02-21 MED ORDER — SODIUM CHLORIDE 0.9 % IV SOLN: INTRAVENOUS | Status: DC

## 2019-02-21 MED ORDER — MIDAZOLAM HCL 5 MG/5ML IJ SOLN
INTRAMUSCULAR | Status: AC
  Filled 2019-02-21: qty 5

## 2019-02-21 MED ORDER — MIDAZOLAM HCL 5 MG/5ML IJ SOLN
INTRAMUSCULAR | Status: AC | PRN
  Administered 2019-02-21 (×2): 1 mg via INTRAVENOUS
  Administered 2019-02-21: 2 mg via INTRAVENOUS

## 2019-02-21 MED ORDER — FENTANYL CITRATE (PF) 100 MCG/2ML IJ SOLN
INTRAMUSCULAR | Status: AC | PRN
  Administered 2019-02-21 (×2): 25 ug via INTRAVENOUS
  Administered 2019-02-21: 50 ug via INTRAVENOUS

## 2019-02-21 NOTE — CV Procedure (Signed)
Transesophageal echocardiogram preliminary report  Derrick Sheppard [id number] August 31, 1956  Preliminary diagnosis  Stroke with possible embolic source  Postprocedural diagnosis  Normal LV systolic function no apparent source of embolus  Time out A timeout was performed by the nursing staff and physicians specifically identifying the procedure performed, identification of the patient, the type of sedation, all allergies and medications, all pertinent medical history, and presedation assessment of nasopharynx. The patient and or family understand the risks of the procedure including the rare risks of death, stroke, heart attack, esophogeal perforation, sore throat, and reaction to medications given.  Moderate sedation During this procedure the patient has received Versed 4 milligrams and fentanyl 100 micrograms to achieve appropriate moderate sedation.  The patient had continued monitoring of heart rate, oxygenation, blood pressure, respiratory rate, and extent of signs of sedation throughout the entire procedure.  The patient received this moderate sedation over a period of 22 minutes.  Both the nursing staff and I were present during the procedure when the patient had moderate sedation for 100% of the time.  Treatment considerations  No further cardiac intervention due to no apparent cardiac source of embolus  For further details of transesophageal echocardiogram please refer to final report.  Signed,  Corey Skains M.D. [facility] 02/21/2019 8:12 AM

## 2019-02-21 NOTE — Progress Notes (Signed)
*  PRELIMINARY RESULTS* Echocardiogram Echocardiogram Transesophageal has been performed.  Sherrie Sport 02/21/2019, 8:11 AM

## 2019-02-22 ENCOUNTER — Encounter: Payer: Self-pay | Admitting: Internal Medicine

## 2019-03-01 DIAGNOSIS — I253 Aneurysm of heart: Secondary | ICD-10-CM | POA: Insufficient documentation

## 2019-03-01 DIAGNOSIS — Q211 Atrial septal defect: Secondary | ICD-10-CM | POA: Insufficient documentation

## 2019-03-01 DIAGNOSIS — Q2112 Patent foramen ovale: Secondary | ICD-10-CM | POA: Insufficient documentation

## 2019-03-13 ENCOUNTER — Ambulatory Visit: Payer: BLUE CROSS/BLUE SHIELD | Admitting: Infectious Diseases

## 2019-03-13 ENCOUNTER — Other Ambulatory Visit: Payer: Self-pay

## 2019-03-13 ENCOUNTER — Encounter: Payer: Self-pay | Admitting: Infectious Diseases

## 2019-03-13 ENCOUNTER — Ambulatory Visit: Payer: BC Managed Care – PPO | Attending: Infectious Diseases | Admitting: Infectious Diseases

## 2019-03-13 VITALS — BP 147/91 | HR 67 | Temp 98.2°F | Ht 67.0 in | Wt 182.1 lb

## 2019-03-13 DIAGNOSIS — Q211 Atrial septal defect: Secondary | ICD-10-CM

## 2019-03-13 DIAGNOSIS — Z888 Allergy status to other drugs, medicaments and biological substances status: Secondary | ICD-10-CM

## 2019-03-13 DIAGNOSIS — R748 Abnormal levels of other serum enzymes: Secondary | ICD-10-CM | POA: Diagnosis not present

## 2019-03-13 DIAGNOSIS — Z8673 Personal history of transient ischemic attack (TIA), and cerebral infarction without residual deficits: Secondary | ICD-10-CM

## 2019-03-13 DIAGNOSIS — R9349 Abnormal radiologic findings on diagnostic imaging of other urinary organs: Secondary | ICD-10-CM | POA: Diagnosis not present

## 2019-03-13 DIAGNOSIS — I2510 Atherosclerotic heart disease of native coronary artery without angina pectoris: Secondary | ICD-10-CM

## 2019-03-13 DIAGNOSIS — K8020 Calculus of gallbladder without cholecystitis without obstruction: Secondary | ICD-10-CM

## 2019-03-13 DIAGNOSIS — K089 Disorder of teeth and supporting structures, unspecified: Secondary | ICD-10-CM

## 2019-03-13 DIAGNOSIS — R972 Elevated prostate specific antigen [PSA]: Secondary | ICD-10-CM

## 2019-03-13 DIAGNOSIS — I10 Essential (primary) hypertension: Secondary | ICD-10-CM

## 2019-03-13 DIAGNOSIS — R509 Fever, unspecified: Secondary | ICD-10-CM | POA: Diagnosis not present

## 2019-03-13 DIAGNOSIS — I728 Aneurysm of other specified arteries: Secondary | ICD-10-CM

## 2019-03-13 DIAGNOSIS — Z87891 Personal history of nicotine dependence: Secondary | ICD-10-CM

## 2019-03-13 DIAGNOSIS — E538 Deficiency of other specified B group vitamins: Secondary | ICD-10-CM

## 2019-03-13 DIAGNOSIS — Z885 Allergy status to narcotic agent status: Secondary | ICD-10-CM

## 2019-03-13 NOTE — Patient Instructions (Addendum)
You are here for fever which you have had for a few weeks and taken 2 coursed of Doxy. The last corse was around May 15th-May 18. No fever since May 18th. You had TEE on 5/13 and it showed PFO/with atrial aneurysm- you have very bad teeth. Both of these conditions puts you at risk for infection-  Recommend you monitor your temp and record it every day morning and eveining for 3-4 weeks See your PCP and get referral to dentist for removal of  the broken teeth. You will need antibiotic before the procedure.  Do not take antibiotic anyother time without getting atleast 3 sets of blood culture- will talk to Edgar

## 2019-03-13 NOTE — Progress Notes (Signed)
NAME: Derrick Sheppard  DOB: 1956-10-08  MRN: [id number]  Date/Time: 03/13/2019 1:31 PM  REQUESTING PROVIDER Dr. Caryl Comes Subjective:  REASON FOR CONSULT: Fever of unknown origin ? Derrick Sheppard is a 63 y.o. with a history of HTN, coronary artery disease, CVA is seeing me because of low-grade fever of 2 months duration.  As per patient and reviewing [name] records patient presented to his PCP on January 10, 2019  with a week history of fever which was present in the evening.  This was accompanied by chills and fatigue.  When he checked his temperature it was 99.4 it was never below 100.  Work-up done on April 9 revealed an alkaline phosphatase which was high more than 500 blood cultures were sent which was negative his ESR was around 54.  He also has had mildly elevated PSA for at least a year now.  Before his presentation with fever he was admitted to the hospital in March 2020 with confusion and headache and found to have a small left occipital lobe/PCA.  infarct.  He was asked to follow-up with his PCP as outpatient and to get TEE done.  He was started on dual anti-platelet therapy.  Patient was also referred to GI for the elevated alkaline phosphatase and underwent extensive autoimmune work-up which was all negative including anti-manual antibody and anti-smooth muscle antibody, ANCA, ANA.  The only positive finding was an ESR which was elevated at 54 and a CRP which was 77 which then reduced to 20.  He had an ultrasound of the upper quadrant and that revealed gallstones with some in the neck of the gallbladder.  CT abdomen and pelvis done on 02/06/2019 revealed a prostate that appeared inhomogenous with nodularity along the superior aspect.  A mass was suspected.  And he has been referred to the urologist.  He was given doxycycline for 2 weeks in April and he says the fever resolved.  It recurred after he stopped the doxycycline and he was given another course a week before Memorial Day weekend.  He  took it for 48 hours and stopped it and he says since around May 20 he has not had any fevers.  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  There was no vegetation on the valves.   Patient has no recent travel history.  He denies any tick bites.  He has not taken any steroids recently.  He has an outdoor cat.  No bites or scratches.  He used to fish but has not done any in the last 5 years.  No hunting No consumption of raw food or milk. No contact with farm animals He used to work in [place] - retired in 2014 No hardware in his body. No contact with tuberculosis. His hobbies include working on cars. Past Medical History:  Diagnosis Date  . Allergic state   . Arthritis   . Asthma   . B12 deficiency   . COPD (chronic obstructive pulmonary disease) ([place])   . Coronary artery disease   . Dysrhythmia   . Edema   . GERD (gastroesophageal reflux disease)   . Headache   . Hypertension   . Palpitations   . Pulmonary nodules     Past Surgical History:  Procedure Laterality Date  . COLONOSCOPY    . COLONOSCOPY WITH PROPOFOL N/A 04/09/2016   Procedure: COLONOSCOPY WITH PROPOFOL;  Surgeon: Lollie Sails, MD;  Location: [facility] ENDOSCOPY;  Service: Endoscopy;  Laterality: N/A;  . TEE WITHOUT CARDIOVERSION N/A 02/21/2019   Procedure: TRANSESOPHAGEAL ECHOCARDIOGRAM (TEE);  Surgeon: Corey Skains, MD;  Location: [facility] ORS;  Service: Cardiovascular;  Laterality: N/A;   Social history Lives with his wife Former smoker No drugs or alcohol  Family history Stomach cancer Father Coronary artery disease father Hypertension Father Alzheimer's disease mother Hypertension mother  Allergies  Allergen Reactions  . Amlodipine Swelling    Makes his ankles and legs swell.  . Codeine Other (See Comments)    Gives him a Headache.    ? Current Outpatient  Medications  Medication Sig Dispense Refill  . acetaminophen (TYLENOL) 500 MG tablet Take 1,000 mg by mouth every 6 (six) hours as needed (for pain.).    Marland Kitchen albuterol (PROVENTIL HFA;VENTOLIN HFA) 108 (90 Base) MCG/ACT inhaler Inhale 2 puffs into the lungs every 6 (six) hours as needed for wheezing or shortness of breath.    Marland Kitchen aspirin EC 81 MG tablet Take 81 mg by mouth daily.    . cholecalciferol (VITAMIN D) 25 MCG (1000 UT) tablet Take 1,000 Units by mouth daily.    . Fluticasone-Salmeterol (ADVAIR) 250-50 MCG/DOSE AEPB Inhale 1 puff into the lungs 2 (two) times daily.    . hydrochlorothiazide (HYDRODIURIL) 12.5 MG tablet Take 12.5 mg by mouth daily.    . INCRUSE ELLIPTA 62.5 MCG/INH AEPB Inhale 1 puff into the lungs daily.    . pantoprazole (PROTONIX) 40 MG tablet Take 40 mg by mouth 2 (two) times a day.    . telmisartan (MICARDIS) 80 MG tablet Take 80 mg by mouth daily.    . vitamin B-12 (CYANOCOBALAMIN) 1000 MCG tablet Take 1,000 mcg by mouth daily.    Marland Kitchen atorvastatin (LIPITOR) 40 MG tablet Take 1 tablet (40 mg total) by mouth daily at 6 PM. 30 tablet 1  . mirtazapine (REMERON) 7.5 MG tablet Take 7.5 mg by mouth at bedtime as needed (anxiousness/rest).      No current facility-administered medications for this visit.      Abtx:  Anti-infectives (From admission, onward)   None      REVIEW OF SYSTEMS:  Const: fever,  chills, negative weight loss Eyes: negative diplopia or visual changes, negative eye pain ENT: negative coryza, negative sore throat Resp: negative cough, hemoptysis, dyspnea Cards: negative for chest pain, palpitations, lower extremity edema GU: negative for frequency, dysuria and hematuria GI: Negative for abdominal pain, diarrhea, bleeding, constipation Skin: negative for rash and pruritus Heme: negative for easy bruising and gum/nose bleeding MS: negative for myalgias, arthralgias, back pain and muscle weakness Neurolo:negative for headaches, dizziness, vertigo,  memory problems  Psych: negative for feelings of anxiety, depression  Endocrine: No polyuria or polydipsia Allergy/Immunology-as above ?  Objective:  VITALS:  BP (!) 147/91 (BP Location: Right Arm, Patient Position: Sitting, Cuff Size: Normal)   Pulse 67   Temp 98.2 F (36.8 C) (Oral)   Ht 5' 7"  (1.702 m)   Wt 182 lb 2 oz (82.6 kg)   BMI 28.52 kg/m  PHYSICAL EXAM:  General: Alert, cooperative, no distress, appears stated age.  Head: Normocephalic, without obvious abnormality, atraumatic. Eyes: Conjunctivae clear, anicteric sclerae. Pupils are equal ENT Nares normal. No drainage or sinus tenderness. Lips, mucosa, and tongue normal. No Thrush Neck: Supple, symmetrical, no adenopathy, thyroid: non tender no carotid bruit and no JVD. Back: No CVA tenderness. Lungs: Clear to auscultation bilaterally. No Wheezing or Rhonchi. No  rales. Heart: Regular rate and rhythm, no murmur, rub or gallop. Abdomen: Soft, non-tender,not distended. Bowel sounds normal. No masses Extremities: atraumatic, no cyanosis. No edema. No clubbing Skin: No rashes or lesions. Or bruising Lymph: Cervical, supraclavicular normal. Neurologic: Grossly non-focal Pertinent Labs Lab Results CBC    Component Value Date/Time   WBC 7.9 12/17/2018 2111   RBC 4.91 12/17/2018 2111   HGB 14.2 12/17/2018 2111   HGB 13.0 12/02/2013 0405   HCT 39.9 12/17/2018 2111   HCT 38.3 (L) 12/02/2013 0405   PLT 241 12/17/2018 2111   PLT 242 12/02/2013 0405   MCV 81.3 12/17/2018 2111   MCV 84 12/02/2013 0405   MCH 28.9 12/17/2018 2111   MCHC 35.6 12/17/2018 2111   RDW 12.3 12/17/2018 2111   RDW 13.1 12/02/2013 0405   LYMPHSABS 2.3 12/17/2018 2111   LYMPHSABS 2.7 12/02/2013 0405   MONOABS 0.6 12/17/2018 2111   MONOABS 1.0 12/02/2013 0405   EOSABS 0.4 12/17/2018 2111   EOSABS 0.0 12/02/2013 0405   BASOSABS 0.1 12/17/2018 2111   BASOSABS 0.1 12/02/2013 0405    CMP Latest Ref Rng & Units 12/17/2018 10/01/2018 01/02/2018   Glucose 70 - 99 mg/dL 107(H) 110(H) 117(H)  BUN 8 - 23 mg/dL 10 11 9   Creatinine 0.61 - 1.24 mg/dL 1.15 0.89 0.97  Sodium 135 - 145 mmol/L 140 134(L) 138  Potassium 3.5 - 5.1 mmol/L 3.2(L) 3.3(L) 3.0(L)  Chloride 98 - 111 mmol/L 107 101 107  CO2 22 - 32 mmol/L 25 23 22   Calcium 8.9 - 10.3 mg/dL 8.5(L) 8.6(L) 8.8(L)  Total Protein 6.5 - 8.1 g/dL 7.3 - -  Total Bilirubin 0.3 - 1.2 mg/dL 1.1 - -  Alkaline Phos 38 - 126 U/L 153(H) - -  AST 15 - 41 U/L 18 - -  ALT 0 - 44 U/L 15 - -     IMAGING RESULTS: I have personally reviewed the films ? Impression/Recommendation ?63 year old male with history of hypertension, coronary artery disease, CVA, B12 deficiency, is referred to me for low-grade fever.  Low-grade fever less than 100 for nearly 6 weeks.  Currently afebrile for the past 2 weeks.  Has been extensively worked up with only positive findings of increased alkaline phosphatase, increased ESR and CRP .  He also has gallbladder stones and stones in the neck of the bladder . Other positive finding is patent foramen ovale with bidirectional flow and intra-atrial septal aneurysm on TEE. This raises the concern for embolic manifestation especially he had a stroke with an infarct on the occipital/PCA area.  Septic emboli is always a concern.  Infective endocarditis is in the differential but the TEE was negative. The low-grade fever is unusual in  infections.  As he has been without temperature for 2 weeks will not do any specific investigations now.  Asked him to monitor his temperature and document for the next 2 to 3 weeks. May consider doing a QuantiFERON gold, angiotensin-converting enzyme level and RPR.  If the temperature is present then will need 3 sets of blood cultures  In [place] records I do see higheosinophil percentage with a low WBC in the past but it has been normal when done in March when he was in the hospital.  So  does not know the significance of it.  Mildly elevated PSA  and abnormal prostate in the CAT scan.  Has been referred to urologist.   will follow the patient in a few weeks time after he monitors his temperature.  Discussed with patient in great detail.  ? ___________________________________________________ Note:  This document was prepared using Dragon voice recognition software and may include unintentional dictation errors.

## 2019-04-30 DIAGNOSIS — Z8673 Personal history of transient ischemic attack (TIA), and cerebral infarction without residual deficits: Secondary | ICD-10-CM | POA: Insufficient documentation

## 2019-06-22 ENCOUNTER — Telehealth: Payer: Self-pay | Admitting: *Deleted

## 2019-06-22 DIAGNOSIS — Z87891 Personal history of nicotine dependence: Secondary | ICD-10-CM

## 2019-06-22 DIAGNOSIS — Z122 Encounter for screening for malignant neoplasm of respiratory organs: Secondary | ICD-10-CM

## 2019-06-22 NOTE — Telephone Encounter (Signed)
Left message for patient to notify them that it is time to schedule annual low dose lung cancer screening CT scan. Instructed patient to call back to verify information prior to the scan being scheduled.  

## 2019-06-22 NOTE — Telephone Encounter (Signed)
Patient has been notified that annual lung cancer screening low dose CT scan is due currently or will be in near future. Confirmed that patient is within the age range of 55-77, and asymptomatic, (no signs or symptoms of lung cancer). Patient denies illness that would prevent curative treatment for lung cancer if found. Verified smoking history, (former, quit 2017, 42 pack year). The shared decision making visit was done 10/30/14. Patient is agreeable for CT scan being scheduled.

## 2019-07-04 ENCOUNTER — Ambulatory Visit
Admission: RE | Admit: 2019-07-04 | Discharge: 2019-07-04 | Disposition: A | Discharge status: Home / Self Care | Payer: Medicare HMO | Source: Ambulatory Visit | Attending: Nurse Practitioner | Admitting: Nurse Practitioner

## 2019-07-04 ENCOUNTER — Other Ambulatory Visit: Payer: Self-pay

## 2019-07-04 DIAGNOSIS — Z122 Encounter for screening for malignant neoplasm of respiratory organs: Secondary | ICD-10-CM | POA: Diagnosis present

## 2019-07-04 DIAGNOSIS — Z87891 Personal history of nicotine dependence: Secondary | ICD-10-CM | POA: Diagnosis present

## 2019-07-06 ENCOUNTER — Telehealth: Payer: Self-pay | Admitting: *Deleted

## 2019-07-06 NOTE — Telephone Encounter (Signed)
Called report  IMPRESSION: 1. New pulmonary nodules in the left lung measuring up to 7.4 mm. Lung-RADS 4A, suspicious. Follow up low-dose chest CT without contrast in 3 months (please use the following order, "CT CHEST LCS NODULE FOLLOW-UP W/O CM") is recommended. Alternatively, PET may be considered when there is a solid component 30mm or larger. 2.  Emphysema. (ICD10-J43.9) 3.  Aortic Atherosclerois (ICD10-170.0)  These results will be called to the ordering clinician or representative by the Radiologist Assistant, and communication documented in the PACS or zVision Dashboard.   Electronically Signed   By: Misty Stanley M.D.   On: 07/06/2019 08:17

## 2019-07-06 NOTE — Telephone Encounter (Signed)
Attempted to call patient to discuss results and recommendation to f/u with ct scan in 3 months. No answer. Left message to return call.

## 2019-07-09 ENCOUNTER — Other Ambulatory Visit: Payer: Self-pay | Admitting: *Deleted

## 2019-07-09 DIAGNOSIS — R972 Elevated prostate specific antigen [PSA]: Secondary | ICD-10-CM

## 2019-07-13 ENCOUNTER — Other Ambulatory Visit: Payer: Medicare HMO

## 2019-07-13 ENCOUNTER — Other Ambulatory Visit: Payer: Self-pay

## 2019-07-13 DIAGNOSIS — R972 Elevated prostate specific antigen [PSA]: Secondary | ICD-10-CM

## 2019-07-14 LAB — PSA TOTAL (REFLEX TO FREE): Prostate Specific Ag, Serum: 4.2 ng/mL — ABNORMAL HIGH (ref 0.0–4.0)

## 2019-07-14 LAB — FPSA% REFLEX
% FREE PSA: 26 %
PSA, FREE: 1.09 ng/mL

## 2019-07-19 ENCOUNTER — Encounter: Payer: Self-pay | Admitting: Urology

## 2019-07-19 ENCOUNTER — Other Ambulatory Visit: Payer: Self-pay

## 2019-07-19 ENCOUNTER — Ambulatory Visit: Payer: Medicare HMO | Admitting: Urology

## 2019-07-19 VITALS — BP 114/71 | HR 87 | Ht 67.0 in | Wt 186.0 lb

## 2019-07-19 DIAGNOSIS — Z125 Encounter for screening for malignant neoplasm of prostate: Secondary | ICD-10-CM

## 2019-07-19 NOTE — Progress Notes (Signed)
   07/19/2019 3:42 PM   Derrick Sheppard 02-Apr-1956 [id number]  Reason for visit: Follow up PSA screening  HPI: I saw Derrick Sheppard back in urology clinic for PSA screening.  He was previously followed by Dr. Junious Sheppard.  His PSA has varied from 4-5.6 over the last few years.  His DRE was normal with Dr. Junious Sheppard in June 2019, and he has a 70 g prostate on a recent CT scan.  PSA density is 0.07.  He denies any urinary symptoms or gross hematuria.  There is no family history of prostate cancer.  PSA this year is stable at 4.2(26% free) from 4.3 last year.   ROS: Please see flowsheet from today's date for complete review of systems.  Physical Exam: BP 114/71   Pulse 87   Ht 5\' 7"  (1.702 m)   Wt 186 lb (84.4 kg)   BMI 29.13 kg/m    Constitutional:  Alert and oriented, No acute distress. Respiratory: Normal respiratory effort, no increased work of breathing. GI: Abdomen is soft, nontender, nondistended, no abdominal masses Skin: No rashes, bruises or suspicious lesions. Neurologic: Grossly intact, no focal deficits, moving all 4 extremities. Psychiatric: Normal mood and affect   Assessment & Plan:   In summary, he is a 63 year old male with no urinary symptoms, no family history of prostate cancer, and stable PSA with reassuring elevated percentage free, and low PSA density.  We reviewed the implications of an elevated PSA and the uncertainty surrounding it. In general, a man's PSA increases with age and is produced by both normal and cancerous prostate tissue. The differential diagnosis for elevated PSA includes BPH, prostate cancer, infection, recent intercourse/ejaculation, recent urethroscopic manipulation (foley placement/cystoscopy) or trauma, and prostatitis.   Management of an elevated PSA can include observation or prostate biopsy and we discussed this in detail. Our goal is to detect clinically significant prostate cancers, and manage with either active surveillance,  surgery, or radiation for localized disease. Risks of prostate biopsy include bleeding, infection (including life threatening sepsis), pain, and lower urinary symptoms. Hematuria, hematospermia, and blood in the stool are all common after biopsy and can persist up to 4 weeks.   We also discussed the AUA guidelines that recommend yearly PSA screening for men up to age 32.  Continue yearly PSA screening  A total of 15 minutes were spent face-to-face with the patient, greater than 50% was spent in patient education, counseling, and coordination of care regarding PSA screening.  Derrick Sheppard, [facility] [address], [place], [place] [address] [phone]

## 2019-07-19 NOTE — Patient Instructions (Signed)

## 2019-09-20 ENCOUNTER — Telehealth: Payer: Self-pay

## 2019-09-20 NOTE — Telephone Encounter (Signed)
Spoke with pt to inform him that it is time for his 3 month follow up lung cancer screening. Pt stated that he does not have any scheduling preferences and is agreeable to having CT scan scheduled at this time.

## 2019-09-25 ENCOUNTER — Other Ambulatory Visit: Payer: Self-pay | Admitting: *Deleted

## 2019-09-25 DIAGNOSIS — R918 Other nonspecific abnormal finding of lung field: Secondary | ICD-10-CM

## 2019-09-25 DIAGNOSIS — Z87891 Personal history of nicotine dependence: Secondary | ICD-10-CM

## 2019-10-03 ENCOUNTER — Ambulatory Visit: Payer: Medicare HMO

## 2019-10-06 ENCOUNTER — Other Ambulatory Visit: Payer: Self-pay

## 2019-10-06 ENCOUNTER — Emergency Department: Payer: Medicare HMO

## 2019-10-06 ENCOUNTER — Encounter: Payer: Self-pay | Admitting: Emergency Medicine

## 2019-10-06 ENCOUNTER — Emergency Department
Admission: EM | Admit: 2019-10-06 | Discharge: 2019-10-06 | Disposition: A | Discharge status: Home / Self Care | Payer: Medicare HMO | Attending: Emergency Medicine | Admitting: Emergency Medicine

## 2019-10-06 DIAGNOSIS — R1013 Epigastric pain: Secondary | ICD-10-CM | POA: Insufficient documentation

## 2019-10-06 DIAGNOSIS — Z5321 Procedure and treatment not carried out due to patient leaving prior to being seen by health care provider: Secondary | ICD-10-CM | POA: Diagnosis not present

## 2019-10-06 DIAGNOSIS — R945 Abnormal results of liver function studies: Secondary | ICD-10-CM | POA: Insufficient documentation

## 2019-10-06 LAB — COMPREHENSIVE METABOLIC PANEL
ALT: 89 U/L — ABNORMAL HIGH (ref 0–44)
AST: 62 U/L — ABNORMAL HIGH (ref 15–41)
Albumin: 3.7 g/dL (ref 3.5–5.0)
Alkaline Phosphatase: 427 U/L — ABNORMAL HIGH (ref 38–126)
Anion gap: 10 (ref 5–15)
BUN: 11 mg/dL (ref 8–23)
CO2: 27 mmol/L (ref 22–32)
Calcium: 8.8 mg/dL — ABNORMAL LOW (ref 8.9–10.3)
Chloride: 103 mmol/L (ref 98–111)
Creatinine, Ser: 1.01 mg/dL (ref 0.61–1.24)
GFR calc Af Amer: >60 mL/min (ref >60)
GFR calc non Af Amer: >60 mL/min (ref >60)
Glucose, Bld: 93 mg/dL (ref 70–99)
Potassium: 3 mmol/L — ABNORMAL LOW (ref 3.5–5.1)
Sodium: 140 mmol/L (ref 135–145)
Total Bilirubin: 2.5 mg/dL — ABNORMAL HIGH (ref 0.3–1.2)
Total Protein: 7.6 g/dL (ref 6.5–8.1)

## 2019-10-06 LAB — CBC
HCT: 38.8 % — ABNORMAL LOW (ref 39.0–52.0)
Hemoglobin: 13.9 g/dL (ref 13.0–17.0)
MCH: 29 pg (ref 26.0–34.0)
MCHC: 35.8 g/dL (ref 30.0–36.0)
MCV: 80.8 fL (ref 80.0–100.0)
Platelets: 221 10*3/uL (ref 150–400)
RBC: 4.8 MIL/uL (ref 4.22–5.81)
RDW: 12.2 % (ref 11.5–15.5)
WBC: 9.7 10*3/uL (ref 4.0–10.5)
nRBC: 0 % (ref 0.0–0.2)

## 2019-10-06 LAB — TROPONIN I (HIGH SENSITIVITY): Troponin I (High Sensitivity): 7 ng/L (ref <18)

## 2019-10-06 MED ORDER — SODIUM CHLORIDE 0.9% FLUSH: 3.0000 mL | Freq: Once | INTRAVENOUS | Status: DC

## 2019-10-06 NOTE — ED Notes (Addendum)
Pt to front desk asking about wait time. Pt states that he is wanting to leave and will see his PCP on Monday. Pt encouraged to stay but declines.

## 2019-10-06 NOTE — ED Triage Notes (Signed)
Epigastric pain x 2 days. History of elevated LFTs. Patient noted to have heart monitor on. Patient states is being monitored for possible a fib due to hx TIAs.

## 2019-10-08 ENCOUNTER — Other Ambulatory Visit: Payer: Self-pay

## 2019-10-08 ENCOUNTER — Ambulatory Visit
Admission: RE | Admit: 2019-10-08 | Discharge: 2019-10-08 | Disposition: A | Discharge status: Home / Self Care | Payer: Medicare HMO | Source: Ambulatory Visit | Attending: Oncology | Admitting: Oncology

## 2019-10-08 DIAGNOSIS — R918 Other nonspecific abnormal finding of lung field: Secondary | ICD-10-CM | POA: Diagnosis present

## 2019-10-08 DIAGNOSIS — Z87891 Personal history of nicotine dependence: Secondary | ICD-10-CM | POA: Diagnosis not present

## 2019-10-10 ENCOUNTER — Telehealth: Payer: Self-pay | Admitting: *Deleted

## 2019-10-10 NOTE — Telephone Encounter (Signed)
Notified patient of LDCT lung cancer screening program results with recommendation for 6 month follow up imaging. Also notified of incidental findings noted below and is encouraged to discuss further with PCP who will receive a copy of this note and/or the CT report. Patient verbalizes understanding.   IMPRESSION: 1. Lung-RADS 3, probably benign findings. Short-term follow-up in 6 months is recommended with repeat low-dose chest CT without contrast (please use the following order, "CT CHEST LCS NODULE FOLLOW-UP W/O CM"). The left lower lobe pleural-based density is minimally enlarged compared to 07/04/2019. Other pulmonary nodules are similar. 2. Aortic atherosclerosis (ICD10-I70.0), coronary artery atherosclerosis and emphysema (ICD10-J43.9). 3. Cholelithiasis. 4. Suggestion of peripancreatic edema, suspicious for pancreatitis.

## 2019-11-05 DIAGNOSIS — I480 Paroxysmal atrial fibrillation: Secondary | ICD-10-CM | POA: Insufficient documentation

## 2019-11-20 ENCOUNTER — Other Ambulatory Visit: Payer: Self-pay | Admitting: Internal Medicine

## 2019-11-20 DIAGNOSIS — K8020 Calculus of gallbladder without cholecystitis without obstruction: Secondary | ICD-10-CM

## 2019-11-23 ENCOUNTER — Other Ambulatory Visit: Payer: Self-pay

## 2019-11-23 ENCOUNTER — Ambulatory Visit
Admission: RE | Admit: 2019-11-23 | Discharge: 2019-11-23 | Disposition: A | Discharge status: Home / Self Care | Payer: Medicare HMO | Source: Ambulatory Visit | Attending: Internal Medicine | Admitting: Internal Medicine

## 2019-11-23 DIAGNOSIS — K8020 Calculus of gallbladder without cholecystitis without obstruction: Secondary | ICD-10-CM | POA: Diagnosis not present

## 2019-11-27 ENCOUNTER — Other Ambulatory Visit: Payer: Self-pay | Admitting: Internal Medicine

## 2019-11-27 DIAGNOSIS — K8020 Calculus of gallbladder without cholecystitis without obstruction: Secondary | ICD-10-CM

## 2019-12-09 ENCOUNTER — Ambulatory Visit: Payer: Medicare HMO | Attending: Internal Medicine

## 2019-12-09 DIAGNOSIS — Z23 Encounter for immunization: Secondary | ICD-10-CM

## 2019-12-09 NOTE — Progress Notes (Signed)
   Covid-19 Vaccination Clinic  Name:  Derrick Sheppard    MRN: [id number] DOB: Aug 24, 1956  12/09/2019  [name] was observed post Covid-19 immunization for 15 minutes without incidence. He was provided with Vaccine Information Sheet and instruction to access the V-Safe system.   [name] was instructed to call 911 with any severe reactions post vaccine: Marland Kitchen Difficulty breathing  . Swelling of your face and throat  . A fast heartbeat  . A bad rash all over your body  . Dizziness and weakness    Immunizations Administered    Name Date Dose VIS Date Route   Pfizer COVID-19 Vaccine 12/09/2019  8:27 AM 0.3 mL 09/21/2019 Intramuscular   Manufacturer: [place]   Lot: [id number]   [place]: [id number]

## 2019-12-12 ENCOUNTER — Ambulatory Visit
Admission: RE | Admit: 2019-12-12 | Discharge: 2019-12-12 | Disposition: A | Discharge status: Home / Self Care | Payer: Medicare HMO | Source: Ambulatory Visit | Attending: Internal Medicine | Admitting: Internal Medicine

## 2019-12-12 ENCOUNTER — Other Ambulatory Visit: Payer: Self-pay

## 2019-12-12 DIAGNOSIS — K8020 Calculus of gallbladder without cholecystitis without obstruction: Secondary | ICD-10-CM | POA: Insufficient documentation

## 2019-12-12 MED ORDER — TECHNETIUM TC 99M MEBROFENIN IV KIT
5.2970 | PACK | Freq: Once | INTRAVENOUS | Status: AC | PRN
  Administered 2019-12-12: 5.297 via INTRAVENOUS

## 2019-12-31 ENCOUNTER — Ambulatory Visit: Payer: Medicare HMO | Attending: Internal Medicine

## 2019-12-31 DIAGNOSIS — Z23 Encounter for immunization: Secondary | ICD-10-CM

## 2019-12-31 NOTE — Progress Notes (Signed)
   Covid-19 Vaccination Clinic  Name:  Derrick Sheppard    MRN: [id number] DOB: [birth year]  12/31/2019  [name] was observed post Covid-19 immunization for 15 minutes without incident. He was provided with Vaccine Information Sheet and instruction to access the V-Safe system.   [name] was instructed to call 911 with any severe reactions post vaccine: Marland Kitchen Difficulty breathing  . Swelling of face and throat  . A fast heartbeat  . A bad rash all over body  . Dizziness and weakness   Immunizations Administered    Name Date Dose VIS Date Route   Pfizer COVID-19 Vaccine 12/31/2019  8:25 AM 0.3 mL 09/21/2019 Intramuscular   Manufacturer: [employer], [employer]   Lot: [id number]   [place]: [id number]

## 2020-01-29 ENCOUNTER — Ambulatory Visit: Payer: Self-pay | Admitting: Surgery

## 2020-02-11 ENCOUNTER — Other Ambulatory Visit: Payer: Self-pay

## 2020-02-11 ENCOUNTER — Encounter
Admission: RE | Admit: 2020-02-11 | Discharge: 2020-02-11 | Disposition: A | Discharge status: Home / Self Care | Payer: Medicare HMO | Source: Ambulatory Visit | Attending: Surgery | Admitting: Surgery

## 2020-02-11 DIAGNOSIS — Z01818 Encounter for other preprocedural examination: Secondary | ICD-10-CM | POA: Insufficient documentation

## 2020-02-11 HISTORY — DX: Other complications of anesthesia, initial encounter: T88.59XA

## 2020-02-11 HISTORY — DX: Personal history of urinary calculi: Z87.442

## 2020-02-11 HISTORY — DX: Sleep apnea, unspecified: G47.30

## 2020-02-11 NOTE — Patient Instructions (Signed)
Your procedure is scheduled on: Thursday 02/21/20  Report to [facility]. To find out your arrival time please call [phone] between 1PM - 3PM on Wednesday 02/20/20.   Remember: Instructions that are not followed completely may result in serious medical risk, up to and including death, or upon the discretion of your surgeon and anesthesiologist your surgery may need to be rescheduled.      _X__ 1. Do not eat food after midnight the night before your procedure.                 No gum chewing or hard candies. You may drink clear liquids up to 2 hours                 before you are scheduled to arrive for your surgery- DO NOT drink clear                 liquids within 2 hours of the start of your surgery.                 Clear Liquids include:  water, apple juice without pulp, clear carbohydrate                 drink such as Clearfast or Gatorade, Black Coffee or Tea (Do not add                 anything to coffee or tea).   __X__2.  On the morning of surgery brush your teeth with toothpaste and water, you may rinse your mouth with mouthwash if you wish.  Do not swallow any toothpaste or mouthwash.   ____  3.  Bring all medications with you on the day of surgery if instructed.     __X__4.  Notify your doctor if there is any change in your medical condition      (cold, fever, infections).       Do not wear jewelry, make-up, hairpins, clips or nail polish. Do not wear lotions, powders, or perfumes.  Do not shave 48 hours prior to surgery. Men may shave face and neck. Do not bring valuables to the hospital.      [facility] is not responsible for any belongings or valuables.   Contacts, dentures/partials or body piercings may not be worn into surgery. Bring a case for your contacts, glasses or hearing aids, a denture cup will be supplied.   Patients discharged the day of surgery will not be allowed to drive  home.    __X__ Take these medicines the morning of surgery with A SIP OF WATER:     1. albuterol (PROVENTIL HFA;VENTOLIN HFA)   2. TRELEGY ELLIPTA 100-62.5-25 MCG/INH AEPB  3. atorvastatin (LIPITOR)   4. pantoprazole (PROTONIX)   5. fluticasone (FLONASE)  If needed    __X__ Use CHG Soap as directed  _ X___ Use inhalers on the day of surgery. Also bring the inhaler with you to the hospital on the morning of surgery.     __X__ Stop Blood Thinners: Eliquis 3 days prior to your surgery date according to Dr.Kowalski's instructions. Your last dose will be on Sunday evening 02/17/20.   __X__ Stop Anti-inflammatories 7 days before surgery such as Advil, Ibuprofen, Motrin, BC or Goodies Powder, Naprosyn, Naproxen, Aleve, Aspirin, Meloxicam. May take Tylenol if needed for pain or discomfort.    __X__ Do not start taking any new herbal supplements or vitamins prior to your procedure.

## 2020-02-19 ENCOUNTER — Other Ambulatory Visit
Admission: RE | Admit: 2020-02-19 | Discharge: 2020-02-19 | Disposition: A | Discharge status: Home / Self Care | Payer: Medicare HMO | Source: Ambulatory Visit | Attending: Surgery | Admitting: Surgery

## 2020-02-19 DIAGNOSIS — Z01812 Encounter for preprocedural laboratory examination: Secondary | ICD-10-CM | POA: Diagnosis present

## 2020-02-19 DIAGNOSIS — Z20822 Contact with and (suspected) exposure to covid-19: Secondary | ICD-10-CM | POA: Insufficient documentation

## 2020-02-19 LAB — SARS CORONAVIRUS 2 (TAT 6-24 HRS): SARS Coronavirus 2: NEGATIVE

## 2020-02-20 NOTE — H&P (View-Only) (Signed)
Subjective:   CC: Calculus of gallbladder without cholecystitis without obstruction [K80.20]  HPI: Derrick Sheppard is a 64 y.o. male who was referred by Cheryll Cockayne, MD for evaluation of above CC. Symptoms were first noted 1 year ago. Pain is intermittent and cramp, confined to the right upper quadrant associated with extensive vomiting afterwards, without radiation. Three episodes in the past, most recent four days ago, exacerbated by nothing specific.   Past Medical History: has a past medical history of Acute blood loss anemia, Allergic state, Anxiety, Arthritis, Asthma without status asthmaticus, unspecified, Atypical migraine, B12 deficiency, COPD (chronic obstructive pulmonary disease) (CMS-HCC), Coronary artery disease, Edema, Emphysema of lung (CMS-HCC), Essential hypertension (10/22/2014), GERD (gastroesophageal reflux disease), History of stroke, Palpitations, Pneumothorax, traumatic (09/21/2017), Pulmonary nodules, Serrated adenoma of colon (04/09/2016), and Tubular adenoma of colon (04/09/2016).  Past Surgical History: has a past surgical history that includes colonoscopy (08/13/2010) and Colonoscopy (04/09/2016).  Family History: family history includes Alzheimer's disease in his mother; Colon cancer (age of onset: 48) in his father; Coronary Artery Disease (Blocked arteries around heart) in his father; High blood pressure (Hypertension) in his father and mother; Stomach cancer in his father.  Social History: reports that he quit smoking about 2 years ago. His smoking use included cigarettes. He has a 90.00 pack-year smoking history. He has never used smokeless tobacco. He reports that he does not drink alcohol or use drugs.  Current Medications: has a current medication list which includes the following prescription(s): albuterol, apixaban, atorvastatin, cholecalciferol, trelegy ellipta, incruse ellipta, mirtazapine, pantoprazole, potassium chloride, telmisartan, and vitamin b  complex vit c no.3.  Allergies:  Allergies as of 01/02/2020 - Reviewed 01/02/2020  Allergen Reaction Noted  . Codeine Nausea and Headache 03/27/2014  . Norvasc [amlodipine] Swelling 03/28/2014   ROS:  A 15 point review of systems was performed and pertinent positives and negatives noted in HPI   Objective:    BP (!) 146/93  Pulse 83  Ht 167.6 cm (_0 )  Wt 85.7 kg (189 lb)  BMI 30.51 kg/m   Constitutional : alert, appears stated age, cooperative and no distress  Lymphatics/Throat: no asymmetry, masses, or scars  Respiratory: clear to auscultation bilaterally  Cardiovascular: regular rate and rhythm  Gastrointestinal: soft, non-tender; bowel sounds normal; no masses, no organomegaly.  Musculoskeletal: Steady gait and movement  Skin: Cool and moist,  Psychiatric: Normal affect, non-agitated, not confused    LABS:  - Lab Results  Component Value Date  WBC 8.3 12/24/2019  HGB 14.1 12/24/2019  HCT 41.2 12/24/2019  PLT 230 12/24/2019   - Lab Results  Component Value Date  NA 141 12/24/2019  K 3.7 12/24/2019  CL 104 12/24/2019  CO2 30.2 12/24/2019  BUN 11 12/24/2019  CREATININE 1.1 12/24/2019  CALCIUM 9.2 12/24/2019  ALB 4.0 12/24/2019  TBILI 0.9 12/24/2019  ALKPHOS 310 (H) 12/24/2019  AST 24 12/24/2019  ALT 25 12/24/2019  GLUCOSE 97 12/24/2019  GFR 67 12/24/2019   - Lab Results  Component Value Date  AST 24 12/24/2019  ALT 25 12/24/2019  ALKPHOS 310 (H) 12/24/2019  TBILI 0.9 12/24/2019  CONJBILI 0.23 (H) 11/16/2019  ALB 4.0 12/24/2019  TOTALPROTEIN 6.6 12/24/2019    RADS: CLINICAL DATA: Abdominal pain.  EXAM: NUCLEAR MEDICINE HEPATOBILIARY IMAGING WITH GALLBLADDER EF  TECHNIQUE: Sequential images of the abdomen were obtained out to 60 minutes following intravenous administration of radiopharmaceutical. After oral ingestion of Ensure, gallbladder ejection fraction was determined. At 60 min, normal ejection fraction is greater  than  33%.  RADIOPHARMACEUTICALS: 5.3 mCi Tc-54mCholetec IV  COMPARISON: Ultrasound 11/23/2019  FINDINGS: Prompt uptake and biliary excretion of activity by the liver is seen. Gallbladder activity is visualized, consistent with patency of cystic duct. Biliary activity passes into small bowel, consistent with patent common bile duct.  Calculated gallbladder ejection fraction is 69%. (Normal gallbladder ejection fraction with Ensure is greater than 33%.)  IMPRESSION: Normal study.  Electronically Signed By: KRolm BaptiseM.D. On: 12/12/2019 14:56  CLINICAL DATA: 64year old male with symptomatic cholelithiasis.  EXAM: ULTRASOUND ABDOMEN LIMITED RIGHT UPPER QUADRANT  COMPARISON: 02/06/2019 CT and 01/23/2019 ultrasound  FINDINGS: Gallbladder:  Multiple gallstones are noted, the largest measuring 6 mm. There is mild gallbladder wall thickening towards the neck measuring up to 4 mm. No sonographic Murphy sign or pericholecystic fluid identified.  Common bile duct:  Diameter: 6 mm. No intrahepatic or extrahepatic biliary dilatation identified.  Liver:  Heterogeneous echotexture noted without focal abnormality. Portal vein is patent on color Doppler imaging with normal direction of blood flow towards the liver.  Other: None.  IMPRESSION: 1. Cholelithiasis with nonspecific gallbladder wall thickening. No sonographic Murphy sign or pericholecystic fluid. If there is strong clinical suspicion for acute cholecystitis, consider nuclear medicine study. 2. Heterogeneous hepatic echotexture without focal hepatic abnormality which may represent hepatocellular disease. 3. No biliary dilatation.  These results will be called to the ordering clinician or representative by the Radiologist Assistant, and communication documented in the PACS or zVision Dashboard.  Electronically Signed By: JMargarette CanadaM.D. On: 11/23/2019 10:09  Other Result Information   Assessment:     Calculus of gallbladder without cholecystitis without obstruction [K80.20]  Plan:    1. Calculus of gallbladder without cholecystitis without obstruction [K80.20]   Discussed the risk of surgery including post-op infxn, seroma, biloma, chronic pain, poor-delayed wound healing, retained gallstone, conversion to open procedure, post-op SBO or ileus, and need for additional procedures to address said risks. The risks of general anesthetic including MI, CVA, sudden death or even reaction to anesthetic medications also discussed. Alternatives include continued observation. Benefits include possible symptom relief, prevention of complications including acute cholecystitis, pancreatitis.  Typical post operative recovery of 3-5 days rest, continued pain in area and incision sites, possible loose stools up to 4-6 weeks, also discussed.  ED return precautions given.  Symptoms potentially be from gallbladder but all tests indicate it is normal except for persistently elevated alk phos.  Pt and wife still wishes to proceed with surgery.    The patient and wife understands the risks, any and all questions were answered to the patient's satisfaction.

## 2020-02-20 NOTE — H&P (Signed)
Subjective:   CC: Calculus of gallbladder without cholecystitis without obstruction [K80.20]  HPI: Derrick Sheppard is a 64 y.o. male who was referred by Cheryll Cockayne, MD for evaluation of above CC. Symptoms were first noted 1 year ago. Pain is intermittent and cramp, confined to the right upper quadrant associated with extensive vomiting afterwards, without radiation. Three episodes in the past, most recent four days ago, exacerbated by nothing specific.   Past Medical History: has a past medical history of Acute blood loss anemia, Allergic state, Anxiety, Arthritis, Asthma without status asthmaticus, unspecified, Atypical migraine, B12 deficiency, COPD (chronic obstructive pulmonary disease) (CMS-HCC), Coronary artery disease, Edema, Emphysema of lung (CMS-HCC), Essential hypertension (10/22/2014), GERD (gastroesophageal reflux disease), History of stroke, Palpitations, Pneumothorax, traumatic (09/21/2017), Pulmonary nodules, Serrated adenoma of colon (04/09/2016), and Tubular adenoma of colon (04/09/2016).  Past Surgical History: has a past surgical history that includes colonoscopy (08/13/2010) and Colonoscopy (04/09/2016).  Family History: family history includes Alzheimer's disease in his mother; Colon cancer (age of onset: 42) in his father; Coronary Artery Disease (Blocked arteries around heart) in his father; High blood pressure (Hypertension) in his father and mother; Stomach cancer in his father.  Social History: reports that he quit smoking about 2 years ago. His smoking use included cigarettes. He has a 90.00 pack-year smoking history. He has never used smokeless tobacco. He reports that he does not drink alcohol or use drugs.  Current Medications: has a current medication list which includes the following prescription(s): albuterol, apixaban, atorvastatin, cholecalciferol, trelegy ellipta, incruse ellipta, mirtazapine, pantoprazole, potassium chloride, telmisartan, and vitamin b  complex vit c no.3.  Allergies:  Allergies as of 01/02/2020 - Reviewed 01/02/2020  Allergen Reaction Noted  . Codeine Nausea and Headache 03/27/2014  . Norvasc [amlodipine] Swelling 03/28/2014   ROS:  A 15 point review of systems was performed and pertinent positives and negatives noted in HPI   Objective:    BP (!) 146/93  Pulse 83  Ht 167.6 cm (5' 6" )  Wt 85.7 kg (189 lb)  BMI 30.51 kg/m   Constitutional : alert, appears stated age, cooperative and no distress  Lymphatics/Throat: no asymmetry, masses, or scars  Respiratory: clear to auscultation bilaterally  Cardiovascular: regular rate and rhythm  Gastrointestinal: soft, non-tender; bowel sounds normal; no masses, no organomegaly.  Musculoskeletal: Steady gait and movement  Skin: Cool and moist,  Psychiatric: Normal affect, non-agitated, not confused    LABS:  - Lab Results  Component Value Date  WBC 8.3 12/24/2019  HGB 14.1 12/24/2019  HCT 41.2 12/24/2019  PLT 230 12/24/2019   - Lab Results  Component Value Date  NA 141 12/24/2019  K 3.7 12/24/2019  CL 104 12/24/2019  CO2 30.2 12/24/2019  BUN 11 12/24/2019  CREATININE 1.1 12/24/2019  CALCIUM 9.2 12/24/2019  ALB 4.0 12/24/2019  TBILI 0.9 12/24/2019  ALKPHOS 310 (H) 12/24/2019  AST 24 12/24/2019  ALT 25 12/24/2019  GLUCOSE 97 12/24/2019  GFR 67 12/24/2019   - Lab Results  Component Value Date  AST 24 12/24/2019  ALT 25 12/24/2019  ALKPHOS 310 (H) 12/24/2019  TBILI 0.9 12/24/2019  CONJBILI 0.23 (H) 11/16/2019  ALB 4.0 12/24/2019  TOTALPROTEIN 6.6 12/24/2019    RADS: CLINICAL DATA: Abdominal pain.  EXAM: NUCLEAR MEDICINE HEPATOBILIARY IMAGING WITH GALLBLADDER EF  TECHNIQUE: Sequential images of the abdomen were obtained out to 60 minutes following intravenous administration of radiopharmaceutical. After oral ingestion of Ensure, gallbladder ejection fraction was determined. At 60 min, normal ejection fraction is greater  than  33%.  RADIOPHARMACEUTICALS: 5.3 mCi Tc-33mCholetec IV  COMPARISON: Ultrasound 11/23/2019  FINDINGS: Prompt uptake and biliary excretion of activity by the liver is seen. Gallbladder activity is visualized, consistent with patency of cystic duct. Biliary activity passes into small bowel, consistent with patent common bile duct.  Calculated gallbladder ejection fraction is 69%. (Normal gallbladder ejection fraction with Ensure is greater than 33%.)  IMPRESSION: Normal study.  Electronically Signed By: KRolm BaptiseM.D. On: 12/12/2019 14:56  CLINICAL DATA: 64year old male with symptomatic cholelithiasis.  EXAM: ULTRASOUND ABDOMEN LIMITED RIGHT UPPER QUADRANT  COMPARISON: 02/06/2019 CT and 01/23/2019 ultrasound  FINDINGS: Gallbladder:  Multiple gallstones are noted, the largest measuring 6 mm. There is mild gallbladder wall thickening towards the neck measuring up to 4 mm. No sonographic Murphy sign or pericholecystic fluid identified.  Common bile duct:  Diameter: 6 mm. No intrahepatic or extrahepatic biliary dilatation identified.  Liver:  Heterogeneous echotexture noted without focal abnormality. Portal vein is patent on color Doppler imaging with normal direction of blood flow towards the liver.  Other: None.  IMPRESSION: 1. Cholelithiasis with nonspecific gallbladder wall thickening. No sonographic Murphy sign or pericholecystic fluid. If there is strong clinical suspicion for acute cholecystitis, consider nuclear medicine study. 2. Heterogeneous hepatic echotexture without focal hepatic abnormality which may represent hepatocellular disease. 3. No biliary dilatation.  These results will be called to the ordering clinician or representative by the Radiologist Assistant, and communication documented in the PACS or zVision Dashboard.  Electronically Signed By: JMargarette CanadaM.D. On: 11/23/2019 10:09  Other Result Information   Assessment:     Calculus of gallbladder without cholecystitis without obstruction [K80.20]  Plan:    1. Calculus of gallbladder without cholecystitis without obstruction [K80.20]   Discussed the risk of surgery including post-op infxn, seroma, biloma, chronic pain, poor-delayed wound healing, retained gallstone, conversion to open procedure, post-op SBO or ileus, and need for additional procedures to address said risks. The risks of general anesthetic including MI, CVA, sudden death or even reaction to anesthetic medications also discussed. Alternatives include continued observation. Benefits include possible symptom relief, prevention of complications including acute cholecystitis, pancreatitis.  Typical post operative recovery of 3-5 days rest, continued pain in area and incision sites, possible loose stools up to 4-6 weeks, also discussed.  ED return precautions given.  Symptoms potentially be from gallbladder but all tests indicate it is normal except for persistently elevated alk phos.  Pt and wife still wishes to proceed with surgery.    The patient and wife understands the risks, any and all questions were answered to the patient's satisfaction.

## 2020-02-21 ENCOUNTER — Ambulatory Visit: Payer: Medicare HMO | Admitting: Anesthesiology

## 2020-02-21 ENCOUNTER — Other Ambulatory Visit: Payer: Self-pay

## 2020-02-21 ENCOUNTER — Inpatient Hospital Stay
Admission: AD | Admit: 2020-02-21 | Discharge: 2020-02-27 | DRG: 417 | Disposition: A | Discharge status: Home / Self Care | Payer: Medicare HMO | Attending: Internal Medicine | Admitting: Internal Medicine

## 2020-02-21 ENCOUNTER — Encounter
Admission: AD | Disposition: A | Discharge status: Home / Self Care | Payer: Self-pay | Source: Home / Self Care | Attending: Surgery

## 2020-02-21 ENCOUNTER — Encounter: Payer: Self-pay | Admitting: Surgery

## 2020-02-21 DIAGNOSIS — R911 Solitary pulmonary nodule: Secondary | ICD-10-CM

## 2020-02-21 DIAGNOSIS — E785 Hyperlipidemia, unspecified: Secondary | ICD-10-CM | POA: Diagnosis present

## 2020-02-21 DIAGNOSIS — J441 Chronic obstructive pulmonary disease with (acute) exacerbation: Secondary | ICD-10-CM

## 2020-02-21 DIAGNOSIS — J439 Emphysema, unspecified: Secondary | ICD-10-CM | POA: Diagnosis present

## 2020-02-21 DIAGNOSIS — K811 Chronic cholecystitis: Secondary | ICD-10-CM | POA: Diagnosis present

## 2020-02-21 DIAGNOSIS — K8012 Calculus of gallbladder with acute and chronic cholecystitis without obstruction: Principal | ICD-10-CM | POA: Diagnosis present

## 2020-02-21 DIAGNOSIS — R059 Cough, unspecified: Secondary | ICD-10-CM

## 2020-02-21 DIAGNOSIS — Z820 Family history of epilepsy and other diseases of the nervous system: Secondary | ICD-10-CM

## 2020-02-21 DIAGNOSIS — J9601 Acute respiratory failure with hypoxia: Secondary | ICD-10-CM | POA: Diagnosis not present

## 2020-02-21 DIAGNOSIS — Z800 Family history of malignant neoplasm of digestive organs: Secondary | ICD-10-CM

## 2020-02-21 DIAGNOSIS — R0902 Hypoxemia: Secondary | ICD-10-CM

## 2020-02-21 DIAGNOSIS — Z20822 Contact with and (suspected) exposure to covid-19: Secondary | ICD-10-CM | POA: Diagnosis present

## 2020-02-21 DIAGNOSIS — R066 Hiccough: Secondary | ICD-10-CM

## 2020-02-21 DIAGNOSIS — K810 Acute cholecystitis: Secondary | ICD-10-CM

## 2020-02-21 DIAGNOSIS — Z87442 Personal history of urinary calculi: Secondary | ICD-10-CM

## 2020-02-21 DIAGNOSIS — Y838 Other surgical procedures as the cause of abnormal reaction of the patient, or of later complication, without mention of misadventure at the time of the procedure: Secondary | ICD-10-CM | POA: Diagnosis not present

## 2020-02-21 DIAGNOSIS — Z87891 Personal history of nicotine dependence: Secondary | ICD-10-CM

## 2020-02-21 DIAGNOSIS — J9811 Atelectasis: Secondary | ICD-10-CM | POA: Diagnosis not present

## 2020-02-21 DIAGNOSIS — I2510 Atherosclerotic heart disease of native coronary artery without angina pectoris: Secondary | ICD-10-CM | POA: Diagnosis present

## 2020-02-21 DIAGNOSIS — I10 Essential (primary) hypertension: Secondary | ICD-10-CM | POA: Diagnosis present

## 2020-02-21 DIAGNOSIS — R1084 Generalized abdominal pain: Secondary | ICD-10-CM

## 2020-02-21 DIAGNOSIS — K219 Gastro-esophageal reflux disease without esophagitis: Secondary | ICD-10-CM | POA: Diagnosis present

## 2020-02-21 DIAGNOSIS — K9161 Intraoperative hemorrhage and hematoma of a digestive system organ or structure complicating a digestive sytem procedure: Secondary | ICD-10-CM | POA: Diagnosis not present

## 2020-02-21 DIAGNOSIS — R710 Precipitous drop in hematocrit: Secondary | ICD-10-CM | POA: Diagnosis not present

## 2020-02-21 DIAGNOSIS — Z720 Tobacco use: Secondary | ICD-10-CM | POA: Diagnosis present

## 2020-02-21 DIAGNOSIS — I959 Hypotension, unspecified: Secondary | ICD-10-CM

## 2020-02-21 DIAGNOSIS — Z8249 Family history of ischemic heart disease and other diseases of the circulatory system: Secondary | ICD-10-CM

## 2020-02-21 LAB — TYPE AND SCREEN
ABO/RH(D): B POS
Antibody Screen: NEGATIVE

## 2020-02-21 LAB — HEMOGLOBIN AND HEMATOCRIT, BLOOD
HCT: 41 % (ref 39.0–52.0)
Hemoglobin: 13.9 g/dL (ref 13.0–17.0)

## 2020-02-21 SURGERY — CHOLECYSTECTOMY, ROBOT-ASSISTED, LAPAROSCOPIC
Anesthesia: General | Site: Abdomen

## 2020-02-21 MED ORDER — GLYCOPYRROLATE 0.2 MG/ML IJ SOLN
INTRAMUSCULAR | Status: AC
  Filled 2020-02-21: qty 1

## 2020-02-21 MED ORDER — DEXAMETHASONE SODIUM PHOSPHATE 10 MG/ML IJ SOLN
INTRAMUSCULAR | Status: AC
  Filled 2020-02-21: qty 1

## 2020-02-21 MED ORDER — FENTANYL CITRATE (PF) 100 MCG/2ML IJ SOLN
INTRAMUSCULAR | Status: DC | PRN
  Administered 2020-02-21: 100 ug via INTRAVENOUS

## 2020-02-21 MED ORDER — VASOPRESSIN 20 UNIT/ML IV SOLN
INTRAVENOUS | Status: AC
  Filled 2020-02-21: qty 1

## 2020-02-21 MED ORDER — INDOCYANINE GREEN 25 MG IV SOLR
1.2500 mg | Freq: Once | INTRAVENOUS | Status: AC
  Administered 2020-02-21: 1.25 mg via INTRAVENOUS
  Filled 2020-02-21: qty 10

## 2020-02-21 MED ORDER — LIDOCAINE-EPINEPHRINE (PF) 1 %-1:200000 IJ SOLN
INTRAMUSCULAR | Status: AC
  Filled 2020-02-21: qty 30

## 2020-02-21 MED ORDER — UMECLIDINIUM BROMIDE 62.5 MCG/INH IN AEPB
1.0000 | INHALATION_SPRAY | Freq: Every day | RESPIRATORY_TRACT | Status: DC
  Administered 2020-02-22 – 2020-02-27 (×6): 1 via RESPIRATORY_TRACT
  Filled 2020-02-21: qty 7

## 2020-02-21 MED ORDER — LACTATED RINGERS IV SOLN: INTRAVENOUS | Status: DC

## 2020-02-21 MED ORDER — MIRTAZAPINE 15 MG PO TABS: 7.5000 mg | ORAL_TABLET | Freq: Every evening | ORAL | Status: DC | PRN

## 2020-02-21 MED ORDER — FLUTICASONE-UMECLIDIN-VILANT 100-62.5-25 MCG/INH IN AEPB: 1.0000 | INHALATION_SPRAY | Freq: Every day | RESPIRATORY_TRACT | Status: DC

## 2020-02-21 MED ORDER — HYDROMORPHONE HCL 1 MG/ML IJ SOLN
1.0000 mg | INTRAMUSCULAR | Status: DC | PRN
  Administered 2020-02-21 – 2020-02-26 (×17): 1 mg via INTRAVENOUS
  Filled 2020-02-21 (×19): qty 1

## 2020-02-21 MED ORDER — PROPOFOL 10 MG/ML IV BOLUS
INTRAVENOUS | Status: AC
  Filled 2020-02-21: qty 20

## 2020-02-21 MED ORDER — ONDANSETRON 4 MG PO TBDP: 4.0000 mg | ORAL_TABLET | Freq: Four times a day (QID) | ORAL | Status: DC | PRN

## 2020-02-21 MED ORDER — ALBUTEROL SULFATE (2.5 MG/3ML) 0.083% IN NEBU
2.5000 mg | INHALATION_SOLUTION | Freq: Four times a day (QID) | RESPIRATORY_TRACT | Status: DC | PRN
  Administered 2020-02-22: 2.5 mg via RESPIRATORY_TRACT
  Filled 2020-02-21 (×2): qty 3

## 2020-02-21 MED ORDER — HYDROMORPHONE HCL 1 MG/ML IJ SOLN
INTRAMUSCULAR | Status: AC
  Administered 2020-02-21: 1 mg via INTRAVENOUS
  Filled 2020-02-21: qty 1

## 2020-02-21 MED ORDER — ACETAMINOPHEN 500 MG PO TABS: 1000.0000 mg | ORAL_TABLET | ORAL | Status: AC

## 2020-02-21 MED ORDER — MIDAZOLAM HCL 2 MG/2ML IJ SOLN
INTRAMUSCULAR | Status: AC
  Filled 2020-02-21: qty 2

## 2020-02-21 MED ORDER — BUPIVACAINE HCL (PF) 0.5 % IJ SOLN
INTRAMUSCULAR | Status: AC
  Filled 2020-02-21: qty 30

## 2020-02-21 MED ORDER — CELECOXIB 200 MG PO CAPS
ORAL_CAPSULE | ORAL | Status: AC
  Administered 2020-02-21: 200 mg via ORAL
  Filled 2020-02-21: qty 1

## 2020-02-21 MED ORDER — FENTANYL CITRATE (PF) 100 MCG/2ML IJ SOLN
25.0000 ug | INTRAMUSCULAR | Status: DC | PRN
  Administered 2020-02-21: 50 ug via INTRAVENOUS

## 2020-02-21 MED ORDER — HYDROMORPHONE HCL 1 MG/ML IJ SOLN
0.5000 mg | INTRAMUSCULAR | Status: AC | PRN
  Administered 2020-02-21 (×2): 0.5 mg via INTRAVENOUS

## 2020-02-21 MED ORDER — HYDROMORPHONE HCL 1 MG/ML IJ SOLN
INTRAMUSCULAR | Status: AC
  Administered 2020-02-21: 0.5 mg via INTRAVENOUS
  Filled 2020-02-21: qty 1

## 2020-02-21 MED ORDER — ACETAMINOPHEN 160 MG/5ML PO SOLN
325.0000 mg | ORAL | Status: DC | PRN
  Filled 2020-02-21: qty 20.3

## 2020-02-21 MED ORDER — CELECOXIB 200 MG PO CAPS: 200.0000 mg | ORAL_CAPSULE | ORAL | Status: AC

## 2020-02-21 MED ORDER — ONDANSETRON HCL 4 MG/2ML IJ SOLN
INTRAMUSCULAR | Status: DC | PRN
  Administered 2020-02-21: 4 mg via INTRAVENOUS

## 2020-02-21 MED ORDER — ROCURONIUM BROMIDE 10 MG/ML (PF) SYRINGE
PREFILLED_SYRINGE | INTRAVENOUS | Status: AC
  Filled 2020-02-21: qty 10

## 2020-02-21 MED ORDER — ACETAMINOPHEN 325 MG PO TABS: 325.0000 mg | ORAL_TABLET | ORAL | Status: DC | PRN

## 2020-02-21 MED ORDER — MIDAZOLAM HCL 2 MG/2ML IJ SOLN
INTRAMUSCULAR | Status: DC | PRN
  Administered 2020-02-21 (×2): 1 mg via INTRAVENOUS

## 2020-02-21 MED ORDER — FLUTICASONE PROPIONATE 50 MCG/ACT NA SUSP
1.0000 | Freq: Every day | NASAL | Status: DC | PRN
  Filled 2020-02-21: qty 16

## 2020-02-21 MED ORDER — PANTOPRAZOLE SODIUM 40 MG PO TBEC
40.0000 mg | DELAYED_RELEASE_TABLET | Freq: Every day | ORAL | Status: DC
  Administered 2020-02-22 – 2020-02-27 (×6): 40 mg via ORAL
  Filled 2020-02-21 (×6): qty 1

## 2020-02-21 MED ORDER — VASOPRESSIN 20 UNIT/ML IV SOLN
INTRAVENOUS | Status: DC | PRN
  Administered 2020-02-21: 1 [IU] via INTRAVENOUS

## 2020-02-21 MED ORDER — PHENYLEPHRINE HCL (PRESSORS) 10 MG/ML IV SOLN
INTRAVENOUS | Status: DC | PRN
  Administered 2020-02-21 (×2): 100 ug via INTRAVENOUS
  Administered 2020-02-21: 200 ug via INTRAVENOUS
  Administered 2020-02-21: 100 ug via INTRAVENOUS
  Administered 2020-02-21: 200 ug via INTRAVENOUS
  Administered 2020-02-21: 100 ug via INTRAVENOUS

## 2020-02-21 MED ORDER — ACETAMINOPHEN 325 MG PO TABS
650.0000 mg | ORAL_TABLET | Freq: Four times a day (QID) | ORAL | Status: DC | PRN
  Administered 2020-02-23 – 2020-02-27 (×4): 650 mg via ORAL
  Filled 2020-02-21 (×4): qty 2

## 2020-02-21 MED ORDER — TRAMADOL HCL 50 MG PO TABS
50.0000 mg | ORAL_TABLET | Freq: Four times a day (QID) | ORAL | Status: DC | PRN
  Administered 2020-02-22 – 2020-02-25 (×5): 50 mg via ORAL
  Filled 2020-02-21 (×5): qty 1

## 2020-02-21 MED ORDER — EPHEDRINE 5 MG/ML INJ
INTRAVENOUS | Status: AC
  Filled 2020-02-21: qty 20

## 2020-02-21 MED ORDER — FENTANYL CITRATE (PF) 100 MCG/2ML IJ SOLN
INTRAMUSCULAR | Status: AC
  Filled 2020-02-21: qty 2

## 2020-02-21 MED ORDER — VITAMIN B-12 1000 MCG PO TABS
1000.0000 ug | ORAL_TABLET | Freq: Every day | ORAL | Status: DC
  Administered 2020-02-22 – 2020-02-27 (×6): 1000 ug via ORAL
  Filled 2020-02-21 (×6): qty 1

## 2020-02-21 MED ORDER — IRBESARTAN 75 MG PO TABS
75.0000 mg | ORAL_TABLET | Freq: Every day | ORAL | Status: DC
  Administered 2020-02-22 – 2020-02-25 (×4): 75 mg via ORAL
  Filled 2020-02-21 (×6): qty 1

## 2020-02-21 MED ORDER — MORPHINE SULFATE (PF) 4 MG/ML IV SOLN
1.0000 mg | INTRAVENOUS | Status: DC | PRN
  Administered 2020-02-22 – 2020-02-24 (×3): 1 mg via INTRAVENOUS
  Filled 2020-02-21 (×4): qty 1

## 2020-02-21 MED ORDER — DEXAMETHASONE SODIUM PHOSPHATE 10 MG/ML IJ SOLN
INTRAMUSCULAR | Status: DC | PRN
  Administered 2020-02-21: 10 mg via INTRAVENOUS

## 2020-02-21 MED ORDER — PROPOFOL 10 MG/ML IV BOLUS
INTRAVENOUS | Status: DC | PRN
  Administered 2020-02-21: 150 mg via INTRAVENOUS

## 2020-02-21 MED ORDER — FLUTICASONE FUROATE-VILANTEROL 100-25 MCG/INH IN AEPB
1.0000 | INHALATION_SPRAY | Freq: Every day | RESPIRATORY_TRACT | Status: DC
  Administered 2020-02-22 – 2020-02-27 (×6): 1 via RESPIRATORY_TRACT
  Filled 2020-02-21: qty 28

## 2020-02-21 MED ORDER — ONDANSETRON HCL 4 MG/2ML IJ SOLN
4.0000 mg | Freq: Four times a day (QID) | INTRAMUSCULAR | Status: DC | PRN
  Administered 2020-02-24: 4 mg via INTRAVENOUS
  Filled 2020-02-21: qty 2

## 2020-02-21 MED ORDER — MORPHINE SULFATE (PF) 4 MG/ML IV SOLN
INTRAVENOUS | Status: AC
  Administered 2020-02-21: 1 mg via INTRAVENOUS
  Filled 2020-02-21: qty 1

## 2020-02-21 MED ORDER — SUCCINYLCHOLINE CHLORIDE 200 MG/10ML IV SOSY
PREFILLED_SYRINGE | INTRAVENOUS | Status: AC
  Filled 2020-02-21: qty 10

## 2020-02-21 MED ORDER — ONDANSETRON HCL 4 MG/2ML IJ SOLN: 4.0000 mg | Freq: Once | INTRAMUSCULAR | Status: DC | PRN

## 2020-02-21 MED ORDER — ATORVASTATIN CALCIUM 20 MG PO TABS
40.0000 mg | ORAL_TABLET | Freq: Every day | ORAL | Status: DC
  Administered 2020-02-22 – 2020-02-26 (×5): 40 mg via ORAL
  Filled 2020-02-21 (×5): qty 2

## 2020-02-21 MED ORDER — ONDANSETRON HCL 4 MG/2ML IJ SOLN
INTRAMUSCULAR | Status: AC
  Filled 2020-02-21: qty 2

## 2020-02-21 MED ORDER — FENTANYL CITRATE (PF) 100 MCG/2ML IJ SOLN
INTRAMUSCULAR | Status: AC
  Administered 2020-02-21: 50 ug via INTRAVENOUS
  Filled 2020-02-21: qty 2

## 2020-02-21 MED ORDER — LIDOCAINE HCL (PF) 2 % IJ SOLN
INTRAMUSCULAR | Status: AC
  Filled 2020-02-21: qty 5

## 2020-02-21 MED ORDER — SODIUM CHLORIDE 0.9 % IV SOLN: INTRAVENOUS | Status: DC

## 2020-02-21 MED ORDER — GABAPENTIN 300 MG PO CAPS: 300.0000 mg | ORAL_CAPSULE | ORAL | Status: AC

## 2020-02-21 MED ORDER — CEFAZOLIN SODIUM-DEXTROSE 2-4 GM/100ML-% IV SOLN
INTRAVENOUS | Status: AC
  Filled 2020-02-21: qty 100

## 2020-02-21 MED ORDER — SUGAMMADEX SODIUM 200 MG/2ML IV SOLN
INTRAVENOUS | Status: DC | PRN
  Administered 2020-02-21: 200 mg via INTRAVENOUS

## 2020-02-21 MED ORDER — EPHEDRINE SULFATE 50 MG/ML IJ SOLN
INTRAMUSCULAR | Status: DC | PRN
  Administered 2020-02-21: 15 mg via INTRAVENOUS
  Administered 2020-02-21 (×2): 10 mg via INTRAVENOUS

## 2020-02-21 MED ORDER — ROCURONIUM BROMIDE 100 MG/10ML IV SOLN
INTRAVENOUS | Status: DC | PRN
  Administered 2020-02-21 (×2): 10 mg via INTRAVENOUS
  Administered 2020-02-21: 60 mg via INTRAVENOUS

## 2020-02-21 MED ORDER — VITAMIN D 25 MCG (1000 UNIT) PO TABS
2000.0000 [IU] | ORAL_TABLET | Freq: Every day | ORAL | Status: DC
  Administered 2020-02-22 – 2020-02-27 (×6): 2000 [IU] via ORAL
  Filled 2020-02-21 (×6): qty 2

## 2020-02-21 MED ORDER — FENTANYL CITRATE (PF) 100 MCG/2ML IJ SOLN
INTRAMUSCULAR | Status: AC
  Administered 2020-02-21: 25 ug via INTRAVENOUS
  Filled 2020-02-21: qty 2

## 2020-02-21 MED ORDER — ACETAMINOPHEN 500 MG PO TABS
ORAL_TABLET | ORAL | Status: AC
  Administered 2020-02-21: 1000 mg via ORAL
  Filled 2020-02-21: qty 2

## 2020-02-21 MED ORDER — GABAPENTIN 300 MG PO CAPS
ORAL_CAPSULE | ORAL | Status: AC
  Administered 2020-02-21: 300 mg via ORAL
  Filled 2020-02-21: qty 1

## 2020-02-21 MED ORDER — CHLORHEXIDINE GLUCONATE CLOTH 2 % EX PADS
6.0000 | MEDICATED_PAD | Freq: Once | CUTANEOUS | Status: AC
  Administered 2020-02-21: 6 via TOPICAL

## 2020-02-21 MED ORDER — LIDOCAINE HCL (CARDIAC) PF 100 MG/5ML IV SOSY
PREFILLED_SYRINGE | INTRAVENOUS | Status: DC | PRN
  Administered 2020-02-21: 100 mg via INTRAVENOUS

## 2020-02-21 MED ORDER — LIDOCAINE-EPINEPHRINE (PF) 1 %-1:200000 IJ SOLN
INTRAMUSCULAR | Status: DC | PRN
  Administered 2020-02-21: 15 mL via INTRAMUSCULAR

## 2020-02-21 MED ORDER — CEFAZOLIN SODIUM-DEXTROSE 2-4 GM/100ML-% IV SOLN
2.0000 g | INTRAVENOUS | Status: AC
  Administered 2020-02-21: 2 g via INTRAVENOUS

## 2020-02-21 MED ORDER — POTASSIUM CHLORIDE 20 MEQ/15ML (10%) PO SOLN
10.0000 meq | Freq: Every day | ORAL | Status: DC
  Administered 2020-02-21 – 2020-02-27 (×7): 10 meq via ORAL
  Filled 2020-02-21 (×7): qty 15

## 2020-02-21 SURGICAL SUPPLY — 58 items
ANCHOR TIS RET SYS 235ML (MISCELLANEOUS) ×3 IMPLANT
BAG INFUSER PRESSURE 100CC (MISCELLANEOUS) ×1 IMPLANT
BLADE SURG SZ11 CARB STEEL (BLADE) ×3 IMPLANT
BULB RESERV EVAC DRAIN JP 100C (MISCELLANEOUS) ×1 IMPLANT
CANISTER SUCT 1200ML W/VALVE (MISCELLANEOUS) ×3 IMPLANT
CANNULA REDUC XI 12-8 STAPL (CANNULA) ×1
CANNULA REDUCER 12-8 DVNC XI (CANNULA) ×2 IMPLANT
CHLORAPREP W/TINT 26 (MISCELLANEOUS) ×3 IMPLANT
CLIP VESOLOCK MED LG 6/CT (CLIP) ×3 IMPLANT
COVER TIP SHEARS 8 DVNC (MISCELLANEOUS) ×2 IMPLANT
COVER TIP SHEARS 8MM DA VINCI (MISCELLANEOUS) ×1
COVER WAND RF STERILE (DRAPES) ×3 IMPLANT
DECANTER SPIKE VIAL GLASS SM (MISCELLANEOUS) ×6 IMPLANT
DEFOGGER SCOPE WARMER CLEARIFY (MISCELLANEOUS) ×3 IMPLANT
DERMABOND ADVANCED (GAUZE/BANDAGES/DRESSINGS) ×1
DERMABOND ADVANCED .7 DNX12 (GAUZE/BANDAGES/DRESSINGS) ×2 IMPLANT
DRAIN CHANNEL JP 15F RND 16 (MISCELLANEOUS) ×1 IMPLANT
DRAPE ARM DVNC X/XI (DISPOSABLE) ×8 IMPLANT
DRAPE COLUMN DVNC XI (DISPOSABLE) ×2 IMPLANT
DRAPE DA VINCI XI ARM (DISPOSABLE) ×4
DRAPE DA VINCI XI COLUMN (DISPOSABLE) ×1
ELECT CAUTERY BLADE 6.4 (BLADE) ×3 IMPLANT
ELECT REM PT RETURN 9FT ADLT (ELECTROSURGICAL) ×3
ELECTRODE REM PT RTRN 9FT ADLT (ELECTROSURGICAL) ×2 IMPLANT
GLOVE BIOGEL PI IND STRL 7.0 (GLOVE) ×4 IMPLANT
GLOVE BIOGEL PI INDICATOR 7.0 (GLOVE) ×2
GLOVE SURG SYN 6.5 ES PF (GLOVE) ×6 IMPLANT
GLOVE SURG SYN 6.5 PF PI (GLOVE) ×4 IMPLANT
GOWN STRL REUS W/ TWL LRG LVL3 (GOWN DISPOSABLE) ×6 IMPLANT
GOWN STRL REUS W/TWL LRG LVL3 (GOWN DISPOSABLE) ×3
GRASPER SUT TROCAR 14GX15 (MISCELLANEOUS) IMPLANT
IRRIGATOR SUCT 8 DISP DVNC XI (IRRIGATION / IRRIGATOR) IMPLANT
IRRIGATOR SUCTION 8MM XI DISP (IRRIGATION / IRRIGATOR) ×1
IV NS 1000ML (IV SOLUTION) ×1
IV NS 1000ML BAXH (IV SOLUTION) IMPLANT
LABEL OR SOLS (LABEL) ×3 IMPLANT
NDL INSUFFLATION 14GA 120MM (NEEDLE) ×2 IMPLANT
NEEDLE HYPO 22GX1.5 SAFETY (NEEDLE) ×3 IMPLANT
NEEDLE INSUFFLATION 14GA 120MM (NEEDLE) ×3 IMPLANT
NS IRRIG 500ML POUR BTL (IV SOLUTION) ×3 IMPLANT
OBTURATOR OPTICAL STANDARD 8MM (TROCAR) ×1
OBTURATOR OPTICAL STND 8 DVNC (TROCAR) ×2
OBTURATOR OPTICALSTD 8 DVNC (TROCAR) ×2 IMPLANT
PACK LAP CHOLECYSTECTOMY (MISCELLANEOUS) ×3 IMPLANT
PENCIL ELECTRO HAND CTR (MISCELLANEOUS) ×3 IMPLANT
SEAL CANN UNIV 5-8 DVNC XI (MISCELLANEOUS) ×6 IMPLANT
SEAL XI 5MM-8MM UNIVERSAL (MISCELLANEOUS) ×3
SET TUBE SMOKE EVAC HIGH FLOW (TUBING) ×3 IMPLANT
SOLUTION ELECTROLUBE (MISCELLANEOUS) ×3 IMPLANT
SPONGE DRAIN TRACH 4X4 STRL 2S (GAUZE/BANDAGES/DRESSINGS) ×1 IMPLANT
STAPLER CANNULA SEAL DVNC XI (STAPLE) ×2 IMPLANT
STAPLER CANNULA SEAL XI (STAPLE) ×1
SUT ETHILON 3-0 FS-10 30 BLK (SUTURE) ×3
SUT MNCRL 4-0 (SUTURE) ×2
SUT MNCRL 4-0 27XMFL (SUTURE) ×4
SUT VICRYL 0 AB UR-6 (SUTURE) ×3 IMPLANT
SUTURE EHLN 3-0 FS-10 30 BLK (SUTURE) IMPLANT
SUTURE MNCRL 4-0 27XMF (SUTURE) ×4 IMPLANT

## 2020-02-21 NOTE — Op Note (Signed)
Preoperative diagnosis: Abdominal pain  Postoperative diagnosis: Chronic cholecystitis  Procedure: Robotic assisted Laparoscopic Cholecystectomy.   Anesthesia: GETA   Surgeon: Benjamine Sprague  Specimen: Gallbladder  Complications: None  EBL: 66mL  Wound Classification: Clean Contaminated  Indications: see HPI  Findings: Critical view of safety noted Cystic duct and artery identified, ligated and divided, clips remained intact at end of procedure Adequate hemostasis  Description of procedure:  The patient was placed on the operating table in the supine position. SCDs placed, pre-op abx administered.  General anesthesia was induced and OG tube placed by anesthesia. A time-out was completed verifying correct patient, procedure, site, positioning, and implant(s) and/or special equipment prior to beginning this procedure. The abdomen was prepped and draped in the usual sterile fashion.    Veress needle was placed at the Palmer's point and insufflation was started after confirming a positive saline drop test and no immediate increase in abdominal pressure.  After reaching 15 mm, the Veress needle was removed and a 8 mm port was placed via optiview technique under umbilicus measured 0000000 from gallbladder.  The abdomen was inspected and moderate amount of fresh blood was noted within the dependent portions.  Inspection of the needle insertion site on the abdominal wall did not show any signs of active bleeding.  No obvious active bleeding was noted within the surrounding tissue which was mostly omentum. Under direct vision, ports were placed in the following locations: One 12 mm patient left of the umbilicus, 8cm from the optiviewed port, one 8 mm port placed to the patient right of the umbilical port 8 cm apart.  1 additional 8 mm port placed lateral to the 21mm port.  Once ports were placed, The table was placed in the reverse Trendelenburg position with the right side up. The Xi platform was  brought into the operative field and docked to the ports successfully.  An endoscope was placed through the umbilical port, fenestrated grasper through the adjacent patient right port, prograsp to the far patient left port, and then a hook cautery in the left port.  Reinspection of the Veress needle insertion sites omentum and extensive running of the bowel in that area showed a small mesenteric tear with no signs of active bleeding.  No other obvious injuries were noted and no other signs of active bleeding were noted after extensive irrigation and multiple inspection of the surrounding tissue.  Decision was made to proceed with the originally scheduled procedure at this point that due to the resolution of any bleeding that may have happened during the Veress needle placement.  The dome of the gallbladder was grasped with prograsp, passed and retracted over the dome of the liver. Adhesions between the gallbladder and omentum, duodenum and transverse colon were lysed via hook cautery. The infundibulum was grasped with the fenestrated grasper and retracted toward the right lower quadrant. This maneuver exposed Calot's triangle. The peritoneum overlying the gallbladder infundibulum was then dissected using combination of [place] dissector and electrocautery hook and the cystic duct and cystic artery identified.  Critical view of safety with the liver bed clearly visible behind the duct and artery with no additional structures noted.  The cystic duct and cystic artery clipped and divided close to the gallbladder.      The gallbladder was then dissected from its peritoneal and liver bed attachments by electrocautery.  Due to the extensive inflammation noted during the procedure, as well as the initial bleeding episode during Veress needle entry, JP drain was placed through  the far right lateral port and the gallbladder fossa.  Hemostasis was checked prior to removing the hook cautery and the Endo Catch bag was then  placed through the 12 mm port and the gallbladder was removed.  The gallbladder was passed off the table as a specimen. There was no evidence of bleeding from the gallbladder fossa or cystic artery or leakage of the bile from the cystic duct stump.  1 last inspection of the Veress needle entry site area did not note any signs of new active bleeding.  The 12 mm port site closed with PMI using 0 vicryl under direct vision.     Abdomen desufflated and secondary trocars were removed under direct vision. No bleeding was noted.   Drain secured to the skin using 3-0 nylon and all remaining skin incisions then closed with subcuticular sutures of 4-0 monocryl and dressed with topical skin adhesive. The orogastric tube was removed and patient extubated.  The patient tolerated the procedure well and was taken to the postanesthesia care unit in stable condition.  All sponge and instrument count correct at end of procedure.

## 2020-02-21 NOTE — Progress Notes (Signed)
Notified by PACU nurse of patient in severe pain.  On exam patient's heart rate was in the low 100, with a growing hematoma-like appearance at his far left lateral incision.  RN was stating that patient was laying on his left side for some time prior to exam.  Additional pain medication was given heart rate dropped back down immediately into the high 80s low 90s.  Abdomen is mostly tender to palpation right upper quadrant with some guarding around incision site, but no rebound tenderness or generalized peritoneal irritation when shaking the bed.  JP drain remains serosanguineous with no additional output even after stripping multiple times.  Hopefully the pain is from the initial bleeding episode and no continued bleeding is currently noted.  10 you to monitor very closely with serial abdominal exams and have low threshold for a second look operation if patient condition worsens.  Type and screen ordered just in case as well

## 2020-02-21 NOTE — Progress Notes (Signed)
Order received from Dr Lysle Pearl to discontinue CBC since a H & H was just drawn

## 2020-02-21 NOTE — Anesthesia Preprocedure Evaluation (Signed)
Anesthesia Evaluation  Patient identified by MRN, date of birth, ID band Patient awake    Reviewed: Allergy & Precautions, H&P , NPO status , reviewed documented beta blocker date and time   History of Anesthesia Complications (+) history of anesthetic complications  Airway Mallampati: III  TM Distance: >3 FB Neck ROM: limited    Dental  (+) Poor Dentition, Chipped, Missing Very poor dentition:   Pulmonary asthma , sleep apnea and Continuous Positive Airway Pressure Ventilation , pneumonia, COPD, former smoker,  Pulmonary and Cardiac clearance on chart   Pulmonary exam normal        Cardiovascular hypertension, + CAD  Normal cardiovascular exam+ dysrhythmias   02/2019 ECHO IMPRESSIONS   1. The left ventricle has normal systolic function, with an ejection  fraction of 55-60%. The cavity size was normal. Left ventricular diastolic  parameters were normal.  2. The right ventricle has normal systolc function. The cavity was  normal. There is no increase in right ventricular wall thickness.  3. Mild thickening of the mitral valve leaflet. No mitral valve  vegetation visualized.  4. No trisuspid valve vegetation visualized.  5. Mild thickening of the aortic valve. Aortic valve regurgitation is  trivial by color flow Doppler.  6. No vegetation on the aortic valve.  7. There is evidence of moderate plaque in the aortic arch.  8. Small patent foramen ovale with bidirectional shunting across atrial  septum.  9. Evidence of atrial level shunting detected by color flow Doppler.  10. The interatrial septum is aneurysmal.    Neuro/Psych  Headaches, CVA, No Residual Symptoms    GI/Hepatic GERD  Medicated and Controlled,  Endo/Other    Renal/GU      Musculoskeletal  (+) Arthritis ,   Abdominal   Peds  Hematology  (+) Blood dyscrasia, anemia ,   Anesthesia Other Findings Past Medical History: No date: Allergic  state No date: Arthritis No date: Asthma No date: B12 deficiency No date: Complication of anesthesia     Comment:  Difficult to wake up with anesthesia from colonoscopy No date: COPD (chronic obstructive pulmonary disease) (HCC) No date: Coronary artery disease No date: Dysrhythmia No date: Edema No date: GERD (gastroesophageal reflux disease) No date: Headache No date: History of kidney stones No date: Hypertension No date: Palpitations No date: Pulmonary nodules No date: Sleep apnea Past Surgical History: No date: COLONOSCOPY 04/09/2016: COLONOSCOPY WITH PROPOFOL; N/A     Comment:  Procedure: COLONOSCOPY WITH PROPOFOL;  Surgeon: Lollie Sails, MD;  Location: [facility] ENDOSCOPY;  Service:               Endoscopy;  Laterality: N/A; 02/21/2019: TEE WITHOUT CARDIOVERSION; N/A     Comment:  Procedure: TRANSESOPHAGEAL ECHOCARDIOGRAM (TEE);                Surgeon: Corey Skains, MD;  Location: [facility] ORS;                Service: Cardiovascular;  Laterality: N/A; BMI    Body Mass Index: 29.76 kg/m     Reproductive/Obstetrics                             Anesthesia Physical Anesthesia Plan  ASA: IV  Anesthesia Plan: General   Post-op Pain Management:    Induction: Intravenous  PONV Risk Score and Plan: 2 and Ondansetron, Treatment may  vary due to age or medical condition, Midazolam and Dexamethasone  Airway Management Planned: Oral ETT  Additional Equipment:   Intra-op Plan:   Post-operative Plan: Extubation in OR  Informed Consent: I have reviewed the patients History and Physical, chart, labs and discussed the procedure including the risks, benefits and alternatives for the proposed anesthesia with the patient or authorized representative who has indicated his/her understanding and acceptance.     Dental Advisory Given  Plan Discussed with: CRNA  Anesthesia Plan Comments:         Anesthesia Quick Evaluation

## 2020-02-21 NOTE — Transfer of Care (Signed)
Immediate Anesthesia Transfer of Care Note  Patient: Derrick Sheppard  Procedure(s) Performed: XI ROBOTIC ASSISTED LAPAROSCOPIC CHOLECYSTECTOMY (N/A Abdomen) INDOCYANINE GREEN FLUORESCENCE IMAGING (ICG)  Patient Location: PACU  Anesthesia Type:General  Level of Consciousness: awake and alert   Airway & Oxygen Therapy: Patient Spontanous Breathing and Patient connected to face mask oxygen  Post-op Assessment: Report given VS reviewed  Post vital signs: Reviewed and stable  Last Vitals:  Vitals Value Taken Time  BP 122/89 02/21/20 1036  Temp    Pulse 85 02/21/20 1037  Resp 20 02/21/20 1037  SpO2 94 % 02/21/20 1037    Last Pain:  Vitals:   02/21/20 1034  TempSrc:   PainSc: (P) 10-Worst pain ever         Complications: No apparent anesthesia complications

## 2020-02-21 NOTE — OR Nursing (Signed)
Wife updated on condition and that we are waiting for a room assignment.  He is sleeping right now.  Told her we will call her when he is going to his room.

## 2020-02-21 NOTE — Anesthesia Procedure Notes (Signed)
Procedure Name: Intubation Date/Time: 02/21/2020 8:05 AM Performed by: Johnna Acosta, CRNA Pre-anesthesia Checklist: Patient identified, Emergency Drugs available, Suction available, Patient being monitored and Timeout performed Patient Re-evaluated:Patient Re-evaluated prior to induction Oxygen Delivery Method: Circle system utilized Preoxygenation: Pre-oxygenation with 100% oxygen Induction Type: IV induction Ventilation: Mask ventilation without difficulty Laryngoscope Size: McGraph and 3 Grade View: Grade I Tube type: Oral Tube size: 7.5 mm Number of attempts: 1 Airway Equipment and Method: Stylet,  Video-laryngoscopy and Oral airway Placement Confirmation: ETT inserted through vocal cords under direct vision,  positive ETCO2 and breath sounds checked- equal and bilateral Secured at: 23 cm Tube secured with: Tape Dental Injury: Teeth and Oropharynx as per pre-operative assessment

## 2020-02-21 NOTE — Progress Notes (Signed)
Dr. Lysle Pearl called regarding STAT H&H order. This RN notified Vickie, PACU RN, who is taking care of patient as well as lab staff who will go to room 209 to draw labs. Vickie, RN, will notify floor nurse to call Dr. Lysle Pearl with results.

## 2020-02-21 NOTE — Interval H&P Note (Signed)
History and Physical Interval Note:  02/21/2020 7:39 AM  Derrick Sheppard  has presented today for surgery, with the diagnosis of K80.20 Calculus of gallbladder without cholecystitis without obstruction.  The various methods of treatment have been discussed with the patient and family. After consideration of risks, benefits and other options for treatment, the patient has consented to  Procedure(s): XI ROBOTIC Hugo (N/A) as a surgical intervention.  The patient's history has been reviewed, patient examined, no change in status, stable for surgery.  I have reviewed the patient's chart and labs.  Questions were answered to the patient's satisfaction.     Angeliz Settlemyre Lysle Pearl

## 2020-02-21 NOTE — Progress Notes (Signed)
Pt in PACU, reports pain 10 out of 10 upper right quadrant; describes as "stabbing and sharp". BP 120s-130s, HR 70s-90s, increases to low 100s as pain medication wears off. Oxygen low 90s, nasal cannula. Pt repositioned, ice pack to abdomen, pain medications given. Abdomen appears more taut than upon arrival into PACU.  Spoke with Estill Bamberg, [place] RN, to report findings as Dr. Shiela Mayer in surgery. Estill Bamberg discussed with MD; Nurse called back stating Dr. Lysle Pearl aware and acknowledged concerns. This nurse instructed to continue to monitor and MD will evaluate pt at bedside.

## 2020-02-21 NOTE — Progress Notes (Signed)
Patient arrived from PACU

## 2020-02-21 NOTE — Anesthesia Postprocedure Evaluation (Signed)
Anesthesia Post Note  Patient: Savian Habetz  Procedure(s) Performed: XI ROBOTIC ASSISTED LAPAROSCOPIC CHOLECYSTECTOMY (N/A Abdomen) INDOCYANINE GREEN FLUORESCENCE IMAGING (ICG)  Patient location during evaluation: PACU Anesthesia Type: General Level of consciousness: awake and alert Pain management: pain level controlled Vital Signs Assessment: post-procedure vital signs reviewed and stable Respiratory status: spontaneous breathing, nonlabored ventilation, respiratory function stable and patient connected to nasal cannula oxygen Cardiovascular status: blood pressure returned to baseline and stable Postop Assessment: no apparent nausea or vomiting Anesthetic complications: no     Last Vitals:  Vitals:   02/21/20 1124 02/21/20 1132  BP:    Pulse:    Resp:  12  Temp:    SpO2: 92%     Last Pain:  Vitals:   02/21/20 1132  TempSrc:   PainSc: Asleep                 Arita Miss

## 2020-02-22 LAB — BASIC METABOLIC PANEL
Anion gap: 8 (ref 5–15)
BUN: 21 mg/dL (ref 8–23)
CO2: 23 mmol/L (ref 22–32)
Calcium: 8.6 mg/dL — ABNORMAL LOW (ref 8.9–10.3)
Chloride: 109 mmol/L (ref 98–111)
Creatinine, Ser: 0.98 mg/dL (ref 0.61–1.24)
GFR calc Af Amer: >60 mL/min (ref >60)
GFR calc non Af Amer: >60 mL/min (ref >60)
Glucose, Bld: 139 mg/dL — ABNORMAL HIGH (ref 70–99)
Potassium: 4.7 mmol/L (ref 3.5–5.1)
Sodium: 140 mmol/L (ref 135–145)

## 2020-02-22 LAB — HEMOGLOBIN AND HEMATOCRIT, BLOOD
HCT: 36.3 % — ABNORMAL LOW (ref 39.0–52.0)
Hemoglobin: 12.9 g/dL — ABNORMAL LOW (ref 13.0–17.0)

## 2020-02-22 LAB — SURGICAL PATHOLOGY

## 2020-02-22 LAB — CBC
HCT: 35.8 % — ABNORMAL LOW (ref 39.0–52.0)
Hemoglobin: 12.5 g/dL — ABNORMAL LOW (ref 13.0–17.0)
MCH: 29.6 pg (ref 26.0–34.0)
MCHC: 34.9 g/dL (ref 30.0–36.0)
MCV: 84.6 fL (ref 80.0–100.0)
Platelets: 215 10*3/uL (ref 150–400)
RBC: 4.23 MIL/uL (ref 4.22–5.81)
RDW: 12.8 % (ref 11.5–15.5)
WBC: 13.6 10*3/uL — ABNORMAL HIGH (ref 4.0–10.5)
nRBC: 0 % (ref 0.0–0.2)

## 2020-02-22 LAB — HEPATIC FUNCTION PANEL
ALT: 47 U/L — ABNORMAL HIGH (ref 0–44)
AST: 42 U/L — ABNORMAL HIGH (ref 15–41)
Albumin: 3.7 g/dL (ref 3.5–5.0)
Alkaline Phosphatase: 255 U/L — ABNORMAL HIGH (ref 38–126)
Bilirubin, Direct: 0.2 mg/dL (ref 0.0–0.2)
Indirect Bilirubin: 0.9 mg/dL (ref 0.3–0.9)
Total Bilirubin: 1.1 mg/dL (ref 0.3–1.2)
Total Protein: 7.2 g/dL (ref 6.5–8.1)

## 2020-02-22 MED ORDER — ACETAMINOPHEN 325 MG PO TABS
650.0000 mg | ORAL_TABLET | Freq: Three times a day (TID) | ORAL | 0 refills | Status: AC | PRN
Start: 2020-02-22 — End: 2020-03-23

## 2020-02-22 MED ORDER — ALUM & MAG HYDROXIDE-SIMETH 200-200-20 MG/5ML PO SUSP
30.0000 mL | Freq: Four times a day (QID) | ORAL | Status: DC | PRN
  Administered 2020-02-22: 30 mL via ORAL
  Filled 2020-02-22 (×2): qty 30

## 2020-02-22 MED ORDER — HYDROMORPHONE HCL 1 MG/ML IJ SOLN
1.0000 mg | Freq: Once | INTRAMUSCULAR | Status: AC
  Administered 2020-02-22: 1 mg via INTRAVENOUS
  Filled 2020-02-22: qty 1

## 2020-02-22 MED ORDER — IBUPROFEN 800 MG PO TABS
800.0000 mg | ORAL_TABLET | Freq: Three times a day (TID) | ORAL | 0 refills | Status: DC | PRN
Start: 2020-02-22 — End: 2020-05-02

## 2020-02-22 MED ORDER — TRAMADOL HCL 50 MG PO TABS: 50.0000 mg | ORAL_TABLET | Freq: Four times a day (QID) | ORAL | 0 refills | Status: DC | PRN

## 2020-02-22 MED ORDER — METHOCARBAMOL 500 MG PO TABS
500.0000 mg | ORAL_TABLET | Freq: Three times a day (TID) | ORAL | Status: DC | PRN
  Administered 2020-02-22 – 2020-02-25 (×4): 500 mg via ORAL
  Filled 2020-02-22 (×4): qty 1

## 2020-02-22 MED ORDER — SIMETHICONE 80 MG PO CHEW
80.0000 mg | CHEWABLE_TABLET | Freq: Four times a day (QID) | ORAL | Status: DC | PRN
  Administered 2020-02-22: 80 mg via ORAL
  Filled 2020-02-22: qty 1

## 2020-02-22 MED ORDER — CELECOXIB 200 MG PO CAPS
200.0000 mg | ORAL_CAPSULE | Freq: Every day | ORAL | Status: DC
  Administered 2020-02-23 – 2020-02-27 (×5): 200 mg via ORAL
  Filled 2020-02-22 (×7): qty 1

## 2020-02-22 MED ORDER — DOCUSATE SODIUM 100 MG PO CAPS: 100.0000 mg | ORAL_CAPSULE | Freq: Two times a day (BID) | ORAL | 0 refills | Status: AC | PRN

## 2020-02-22 MED ORDER — GABAPENTIN 100 MG PO CAPS
200.0000 mg | ORAL_CAPSULE | Freq: Two times a day (BID) | ORAL | Status: DC
  Administered 2020-02-22 – 2020-02-25 (×8): 200 mg via ORAL
  Filled 2020-02-22 (×8): qty 2

## 2020-02-22 NOTE — Progress Notes (Signed)
Patient continuing to have uncontroled RUQ pain. VS stable. Dr. Lysle Pearl ordering additional pain medication.   Fuller Mandril, RN

## 2020-02-22 NOTE — Progress Notes (Signed)
Spoke with wife on phone to give update.   Fuller Mandril, RN

## 2020-02-22 NOTE — Progress Notes (Addendum)
Patient having increased RUQ pain POD#1. Patient states pain is sharp and hurts on inspiration along with the continuous hiccups. Encouraged patient to ambulate to assist with the dissipation of gas. Patient denies nausea. Continue to monitor. Alerted Dr. Lysle Pearl.   Fuller Mandril, RN

## 2020-02-22 NOTE — Progress Notes (Signed)
Subjective:  CC: Derrick Sheppard is a 64 y.o. male  Hospital stay day 0, 1 Day Post-Op robo lap chole with bleeding concerns  HPI: No acute issues overnight.  Pain better but still present  ROS:  General: Denies weight loss, weight gain, fatigue, fevers, chills, and night sweats. Heart: Denies chest pain, palpitations, racing heart, irregular heartbeat, leg pain or swelling, and decreased activity tolerance. Respiratory: Denies breathing difficulty, shortness of breath, wheezing, cough, and sputum. GI: Denies change in appetite, heartburn, nausea, vomiting, constipation, diarrhea, and blood in stool. GU: Denies difficulty urinating, pain with urinating, urgency, frequency, blood in urine.   Objective:   Temp:  [97.3 F (36.3 C)-98.8 F (37.1 C)] 97.3 F (36.3 C) (05/14 0506) Pulse Rate:  [69-113] 69 (05/14 0506) Resp:  [8-28] 16 (05/14 0506) BP: (100-150)/(40-100) 130/83 (05/14 0506) SpO2:  [87 %-95 %] 92 % (05/14 0506)     Height: 5\' 7"  (170.2 cm) Weight: 86.2 kg BMI (Calculated): 29.75   Intake/Output this shift:   Intake/Output Summary (Last 24 hours) at 02/22/2020 0804 Last data filed at 02/22/2020 0745 Gross per 24 hour  Intake 1646.02 ml  Output 835 ml  Net 811.02 ml    Constitutional :  alert, cooperative, appears stated age and no distress  Respiratory:  clear to auscultation bilaterally  Cardiovascular:  regular rate and rhythm  Gastrointestinal: soft, no guarding, still has TTP in periumbilical and RUQ region.   Skin: Cool and moist. Incisions C/D/I.  Psychiatric: Normal affect, non-agitated, not confused       LABS:  CMP Latest Ref Rng & Units 02/22/2020 10/06/2019 12/17/2018  Glucose 70 - 99 mg/dL 139(H) 93 107(H)  BUN 8 - 23 mg/dL 21 11 10   Creatinine 0.61 - 1.24 mg/dL 0.98 1.01 1.15  Sodium 135 - 145 mmol/L 140 140 140  Potassium 3.5 - 5.1 mmol/L 4.7 3.0(L) 3.2(L)  Chloride 98 - 111 mmol/L 109 103 107  CO2 22 - 32 mmol/L 23 27 25   Calcium 8.9 - 10.3  mg/dL 8.6(L) 8.8(L) 8.5(L)  Total Protein 6.5 - 8.1 g/dL - 7.6 7.3  Total Bilirubin 0.3 - 1.2 mg/dL - 2.5(H) 1.1  Alkaline Phos 38 - 126 U/L - 427(H) 153(H)  AST 15 - 41 U/L - 62(H) 18  ALT 0 - 44 U/L - 89(H) 15   CBC Latest Ref Rng & Units 02/22/2020 02/21/2020 10/06/2019  WBC 4.0 - 10.5 K/uL 13.6(H) - 9.7  Hemoglobin 13.0 - 17.0 g/dL 12.5(L) 13.9 13.9  Hematocrit 39.0 - 52.0 % 35.8(L) 41.0 38.8(L)  Platelets 150 - 400 K/uL 215 - 221    RADS: n/a Assessment:   S/p robo lap chole with bleeding concerns.  hgb drop of 1g but likely no further active bleeding issues.  Will advance diet as tolerated, reassess later today, and if pain continues to improve likely d/c later today

## 2020-02-22 NOTE — Care Management Obs Status (Signed)
[facility] NOTIFICATION   Patient Details  Name: Derrick Sheppard MRN: [id number] Date of Birth: [birth year]   Medicare Observation Status Notification Given:  Yes    Candie Chroman, LCSW 02/22/2020, 10:17 AM

## 2020-02-22 NOTE — Progress Notes (Signed)
Per Dr. Lysle Pearl additional one time dose of 1mg  Dilaudid given.   Fuller Mandril, RN

## 2020-02-22 NOTE — Progress Notes (Signed)
Patient states pain is has decreased to 4/10. Patient given tramadol and simethicone to assist with pain control.   Fuller Mandril, RN

## 2020-02-23 ENCOUNTER — Observation Stay: Payer: Medicare HMO

## 2020-02-23 DIAGNOSIS — Z87442 Personal history of urinary calculi: Secondary | ICD-10-CM | POA: Diagnosis not present

## 2020-02-23 DIAGNOSIS — K9161 Intraoperative hemorrhage and hematoma of a digestive system organ or structure complicating a digestive sytem procedure: Secondary | ICD-10-CM | POA: Diagnosis not present

## 2020-02-23 DIAGNOSIS — J9601 Acute respiratory failure with hypoxia: Secondary | ICD-10-CM | POA: Diagnosis not present

## 2020-02-23 DIAGNOSIS — Z8249 Family history of ischemic heart disease and other diseases of the circulatory system: Secondary | ICD-10-CM | POA: Diagnosis not present

## 2020-02-23 DIAGNOSIS — Y838 Other surgical procedures as the cause of abnormal reaction of the patient, or of later complication, without mention of misadventure at the time of the procedure: Secondary | ICD-10-CM | POA: Diagnosis not present

## 2020-02-23 DIAGNOSIS — I10 Essential (primary) hypertension: Secondary | ICD-10-CM | POA: Diagnosis present

## 2020-02-23 DIAGNOSIS — K8012 Calculus of gallbladder with acute and chronic cholecystitis without obstruction: Secondary | ICD-10-CM | POA: Diagnosis present

## 2020-02-23 DIAGNOSIS — Z820 Family history of epilepsy and other diseases of the nervous system: Secondary | ICD-10-CM | POA: Diagnosis not present

## 2020-02-23 DIAGNOSIS — J439 Emphysema, unspecified: Secondary | ICD-10-CM | POA: Diagnosis not present

## 2020-02-23 DIAGNOSIS — Z720 Tobacco use: Secondary | ICD-10-CM | POA: Diagnosis not present

## 2020-02-23 DIAGNOSIS — K810 Acute cholecystitis: Secondary | ICD-10-CM | POA: Diagnosis present

## 2020-02-23 DIAGNOSIS — Z20822 Contact with and (suspected) exposure to covid-19: Secondary | ICD-10-CM | POA: Diagnosis present

## 2020-02-23 DIAGNOSIS — R911 Solitary pulmonary nodule: Secondary | ICD-10-CM | POA: Diagnosis present

## 2020-02-23 DIAGNOSIS — R1084 Generalized abdominal pain: Secondary | ICD-10-CM | POA: Diagnosis not present

## 2020-02-23 DIAGNOSIS — I2510 Atherosclerotic heart disease of native coronary artery without angina pectoris: Secondary | ICD-10-CM | POA: Diagnosis present

## 2020-02-23 DIAGNOSIS — I959 Hypotension, unspecified: Secondary | ICD-10-CM | POA: Diagnosis not present

## 2020-02-23 DIAGNOSIS — E785 Hyperlipidemia, unspecified: Secondary | ICD-10-CM | POA: Diagnosis present

## 2020-02-23 DIAGNOSIS — K219 Gastro-esophageal reflux disease without esophagitis: Secondary | ICD-10-CM | POA: Diagnosis present

## 2020-02-23 DIAGNOSIS — J9811 Atelectasis: Secondary | ICD-10-CM | POA: Diagnosis not present

## 2020-02-23 DIAGNOSIS — J9621 Acute and chronic respiratory failure with hypoxia: Secondary | ICD-10-CM | POA: Diagnosis not present

## 2020-02-23 DIAGNOSIS — J441 Chronic obstructive pulmonary disease with (acute) exacerbation: Secondary | ICD-10-CM | POA: Diagnosis not present

## 2020-02-23 DIAGNOSIS — R710 Precipitous drop in hematocrit: Secondary | ICD-10-CM | POA: Diagnosis not present

## 2020-02-23 DIAGNOSIS — R066 Hiccough: Secondary | ICD-10-CM | POA: Diagnosis not present

## 2020-02-23 DIAGNOSIS — Z800 Family history of malignant neoplasm of digestive organs: Secondary | ICD-10-CM | POA: Diagnosis not present

## 2020-02-23 DIAGNOSIS — Z87891 Personal history of nicotine dependence: Secondary | ICD-10-CM | POA: Diagnosis not present

## 2020-02-23 LAB — CBC WITH DIFFERENTIAL/PLATELET
Abs Immature Granulocytes: 0.05 10*3/uL (ref 0.00–0.07)
Basophils Absolute: 0.1 10*3/uL (ref 0.0–0.1)
Basophils Relative: 1 %
Eosinophils Absolute: 0.1 10*3/uL (ref 0.0–0.5)
Eosinophils Relative: 0 %
HCT: 37.6 % — ABNORMAL LOW (ref 39.0–52.0)
Hemoglobin: 12.8 g/dL — ABNORMAL LOW (ref 13.0–17.0)
Immature Granulocytes: 0 %
Lymphocytes Relative: 15 %
Lymphs Abs: 1.9 10*3/uL (ref 0.7–4.0)
MCH: 29.4 pg (ref 26.0–34.0)
MCHC: 34 g/dL (ref 30.0–36.0)
MCV: 86.4 fL (ref 80.0–100.0)
Monocytes Absolute: 1.1 10*3/uL — ABNORMAL HIGH (ref 0.1–1.0)
Monocytes Relative: 9 %
Neutro Abs: 8.9 10*3/uL — ABNORMAL HIGH (ref 1.7–7.7)
Neutrophils Relative %: 75 %
Platelets: 210 10*3/uL (ref 150–400)
RBC: 4.35 MIL/uL (ref 4.22–5.81)
RDW: 13.1 % (ref 11.5–15.5)
WBC: 12 10*3/uL — ABNORMAL HIGH (ref 4.0–10.5)
nRBC: 0 % (ref 0.0–0.2)

## 2020-02-23 LAB — BASIC METABOLIC PANEL
Anion gap: 6 (ref 5–15)
BUN: 19 mg/dL (ref 8–23)
CO2: 27 mmol/L (ref 22–32)
Calcium: 8.5 mg/dL — ABNORMAL LOW (ref 8.9–10.3)
Chloride: 103 mmol/L (ref 98–111)
Creatinine, Ser: 0.97 mg/dL (ref 0.61–1.24)
GFR calc Af Amer: >60 mL/min (ref >60)
GFR calc non Af Amer: >60 mL/min (ref >60)
Glucose, Bld: 115 mg/dL — ABNORMAL HIGH (ref 70–99)
Potassium: 4.6 mmol/L (ref 3.5–5.1)
Sodium: 136 mmol/L (ref 135–145)

## 2020-02-23 MED ORDER — IPRATROPIUM-ALBUTEROL 0.5-2.5 (3) MG/3ML IN SOLN
3.0000 mL | Freq: Four times a day (QID) | RESPIRATORY_TRACT | Status: DC
  Administered 2020-02-23 (×3): 3 mL via RESPIRATORY_TRACT
  Filled 2020-02-23 (×3): qty 3

## 2020-02-23 MED ORDER — ALBUTEROL SULFATE (2.5 MG/3ML) 0.083% IN NEBU: 2.5000 mg | INHALATION_SOLUTION | RESPIRATORY_TRACT | Status: DC | PRN

## 2020-02-23 MED ORDER — ALBUTEROL SULFATE (2.5 MG/3ML) 0.083% IN NEBU: 2.5000 mg | INHALATION_SOLUTION | RESPIRATORY_TRACT | Status: DC

## 2020-02-23 NOTE — Progress Notes (Signed)
Per patient's request ambulated twice around unit. Patient stated pain is at zero after ambulating. Reminded patient to continue using IS and to call if pain begins to increase. Patient appears more rested this afternoon.   Fuller Mandril, RN

## 2020-02-23 NOTE — Progress Notes (Signed)
[facility] Hospital Day(s): 0.   Post op day(s): 2 Days Post-Op.   Interval History: Patient seen and examined, no acute events or new complaints overnight. Patient reports having cough with phlegm.  Denies chest pain or shortness of breath.  Denies fever or chills.  Patient very concerned about pneumonia.  I ordered an x-ray of the chest.  There is atelectasis but no consolidation or infiltration.  I personally evaluated the images.  Patient still reported that the pain is not controlled with the current oral pain medication and he still needs IV pain medications.  Vital signs in last 24 hours: [min-max] current  Temp:  [97.8 F (36.6 C)-99.8 F (37.7 C)] 99.8 F (37.7 C) (05/15 0457) Pulse Rate:  [88-109] 95 (05/15 0457) Resp:  [20-24] 20 (05/15 0457) BP: (91-151)/(65-98) 91/74 (05/15 0457) SpO2:  [89 %-94 %] 94 % (05/15 0457) FiO2 (%):  [35 %] 35 % (05/14 1658)     Height: 5\' 7"  (170.2 cm) Weight: 86.2 kg BMI (Calculated): 29.75   Physical Exam:  Constitutional: alert, cooperative and no distress  Respiratory: breathing non-labored at rest  Cardiovascular: regular rate and sinus rhythm  Gastrointestinal: soft, moderate-tender, and non-distended  Labs:  CBC Latest Ref Rng & Units 02/23/2020 02/22/2020 02/22/2020  WBC 4.0 - 10.5 K/uL 12.0(H) - 13.6(H)  Hemoglobin 13.0 - 17.0 g/dL 12.8(L) 12.9(L) 12.5(L)  Hematocrit 39.0 - 52.0 % 37.6(L) 36.3(L) 35.8(L)  Platelets 150 - 400 K/uL 210 - 215   CMP Latest Ref Rng & Units 02/23/2020 02/22/2020 10/06/2019  Glucose 70 - 99 mg/dL 115(H) 139(H) 93  BUN 8 - 23 mg/dL 19 21 11   Creatinine 0.61 - 1.24 mg/dL 0.97 0.98 1.01  Sodium 135 - 145 mmol/L 136 140 140  Potassium 3.5 - 5.1 mmol/L 4.6 4.7 3.0(L)  Chloride 98 - 111 mmol/L 103 109 103  CO2 22 - 32 mmol/L 27 23 27   Calcium 8.9 - 10.3 mg/dL 8.5(L) 8.6(L) 8.8(L)  Total Protein 6.5 - 8.1 g/dL - 7.2 7.6  Total Bilirubin 0.3 - 1.2 mg/dL - 1.1 2.5(H)  Alkaline Phos 38 - 126 U/L  - 255(H) 427(H)  AST 15 - 41 U/L - 42(H) 62(H)  ALT 0 - 44 U/L - 47(H) 89(H)    Imaging studies: Chest x-ray shows atelectasis but no fluid, infiltration or consolidation.   Assessment/Plan:  64 y.o. male with postoperative pain 2 Days Post-Op s/p robotic assisted laparoscopic cholecystectomy.  Patient still with significant pain.  Pain has not been able to be controlled with current oral pain medications.  Patient still need IV pain medications.  Patient uncomfortable at this moment.  We will continue pain management.  I order incentive spirometer to improve his atelectasis.  I think that the coughing and phlegm coming from irritation from the intubation for the surgery.  I encouraged the patient to ambulate and get out of bed.  That will also improve his coughing and he has atelectasis.  Arnold Long, MD

## 2020-02-23 NOTE — Progress Notes (Signed)
Patient complaining of tightness in his chest and he has been coughing up phlegm. He thinks that he may have pneumonia so I called Dr. Windell Moment to see about ordering a chest xray. Patient still complaining of RUQ pain that is sharp and stabbing.  I have been giving him dilaudid every 3 hours to alleviate the pain.  Appropriate orders were placed.  Will continue to monitor.  Christene Slates  02/23/2020  6:29 AM

## 2020-02-24 ENCOUNTER — Inpatient Hospital Stay: Payer: Medicare HMO

## 2020-02-24 ENCOUNTER — Encounter: Payer: Self-pay | Admitting: Surgery

## 2020-02-24 LAB — CBC WITH DIFFERENTIAL/PLATELET
Abs Immature Granulocytes: 0.06 10*3/uL (ref 0.00–0.07)
Basophils Absolute: 0.1 10*3/uL (ref 0.0–0.1)
Basophils Relative: 1 %
Eosinophils Absolute: 0.3 10*3/uL (ref 0.0–0.5)
Eosinophils Relative: 2 %
HCT: 35.7 % — ABNORMAL LOW (ref 39.0–52.0)
Hemoglobin: 12.4 g/dL — ABNORMAL LOW (ref 13.0–17.0)
Immature Granulocytes: 1 %
Lymphocytes Relative: 15 %
Lymphs Abs: 1.9 10*3/uL (ref 0.7–4.0)
MCH: 29.7 pg (ref 26.0–34.0)
MCHC: 34.7 g/dL (ref 30.0–36.0)
MCV: 85.4 fL (ref 80.0–100.0)
Monocytes Absolute: 1.1 10*3/uL — ABNORMAL HIGH (ref 0.1–1.0)
Monocytes Relative: 9 %
Neutro Abs: 9.1 10*3/uL — ABNORMAL HIGH (ref 1.7–7.7)
Neutrophils Relative %: 72 %
Platelets: 193 10*3/uL (ref 150–400)
RBC: 4.18 MIL/uL — ABNORMAL LOW (ref 4.22–5.81)
RDW: 12.8 % (ref 11.5–15.5)
WBC: 12.6 10*3/uL — ABNORMAL HIGH (ref 4.0–10.5)
nRBC: 0 % (ref 0.0–0.2)

## 2020-02-24 MED ORDER — IOHEXOL 300 MG/ML  SOLN
100.0000 mL | Freq: Once | INTRAMUSCULAR | Status: AC | PRN
  Administered 2020-02-24: 100 mL via INTRAVENOUS

## 2020-02-24 MED ORDER — IPRATROPIUM-ALBUTEROL 0.5-2.5 (3) MG/3ML IN SOLN
3.0000 mL | Freq: Three times a day (TID) | RESPIRATORY_TRACT | Status: DC
  Administered 2020-02-24 – 2020-02-27 (×10): 3 mL via RESPIRATORY_TRACT
  Filled 2020-02-24 (×10): qty 3

## 2020-02-24 MED ORDER — IOHEXOL 9 MG/ML PO SOLN
500.0000 mL | ORAL | Status: AC
  Administered 2020-02-24 (×2): 500 mL via ORAL

## 2020-02-24 NOTE — Progress Notes (Signed)
Patient transported to CT 

## 2020-02-24 NOTE — Progress Notes (Signed)
Patient returned from [place]. Temp 100.3. Dr Windell Moment informed and he requested that the patient receive tylenol

## 2020-02-24 NOTE — Progress Notes (Signed)
Surgery Follow UP  CT scan negative for any collection or anything that can explain patient's pain or hiccups.  Drain in place without any fluid collection around.  There is no sign of bleeding.  Hemoglobin has been stable.  I think that patient pain might be coming from the drain that might be irritating the diaphragm.  I will remove the drain to see if this makes the patient feels better.

## 2020-02-24 NOTE — Progress Notes (Signed)
[facility] Hospital Day(s): 1.   Post op day(s): 3 Days Post-Op.   Interval History: Patient seen and examined, no acute events or new complaints overnight. Patient reports continue with severe pain right upper quadrant.  Reported that he was doing better yesterday but the pain started again in the right upper quadrant.  The pain is associated with hiccups.  Denies nausea or vomiting.  Patient had a low-grade temp fever of 100.2.  Vital signs in last 24 hours: [min-max] current  Temp:  [98.4 F (36.9 C)-100 F (37.8 C)] 100 F (37.8 C) (05/16 1127) Pulse Rate:  [85-109] 98 (05/16 1127) Resp:  [19-20] 20 (05/16 1127) BP: (109-138)/(60-82) 127/79 (05/16 1127) SpO2:  [91 %-98 %] 94 % (05/16 1127) FiO2 (%):  [30 %-40 %] 40 % (05/16 0719)     Height: 5\' 7"  (170.2 cm) Weight: 86.2 kg BMI (Calculated): 29.75   Physical Exam:  Constitutional: alert, cooperative and no distress  Respiratory: breathing non-labored at rest  Cardiovascular: regular rate and sinus rhythm  Gastrointestinal: soft, tender, and non-distended.  Drain serous.  Labs:  CBC Latest Ref Rng & Units 02/24/2020 02/23/2020 02/22/2020  WBC 4.0 - 10.5 K/uL 12.6(H) 12.0(H) -  Hemoglobin 13.0 - 17.0 g/dL 12.4(L) 12.8(L) 12.9(L)  Hematocrit 39.0 - 52.0 % 35.7(L) 37.6(L) 36.3(L)  Platelets 150 - 400 K/uL 193 210 -   CMP Latest Ref Rng & Units 02/23/2020 02/22/2020 10/06/2019  Glucose 70 - 99 mg/dL 115(H) 139(H) 93  BUN 8 - 23 mg/dL 19 21 11   Creatinine 0.61 - 1.24 mg/dL 0.97 0.98 1.01  Sodium 135 - 145 mmol/L 136 140 140  Potassium 3.5 - 5.1 mmol/L 4.6 4.7 3.0(L)  Chloride 98 - 111 mmol/L 103 109 103  CO2 22 - 32 mmol/L 27 23 27   Calcium 8.9 - 10.3 mg/dL 8.5(L) 8.6(L) 8.8(L)  Total Protein 6.5 - 8.1 g/dL - 7.2 7.6  Total Bilirubin 0.3 - 1.2 mg/dL - 1.1 2.5(H)  Alkaline Phos 38 - 126 U/L - 255(H) 427(H)  AST 15 - 41 U/L - 42(H) 62(H)  ALT 0 - 44 U/L - 47(H) 89(H)    Imaging studies: No new pertinent  imaging studies   Assessment/Plan:  64 y.o. male with postoperative pain 3 Days Post-Op s/p robotic assisted laparoscopic cholecystectomy.  Patient with persistent significant pain.  Now having hiccups.  I will order CT scan to rule out Perihepatic collection that may be causing the pain irritating the diaphragm.  Continue with pain management.  Encourage the patient to perform incentive spirometer encouraged the patient to get out of bed.  Will follow CT scan results.  Arnold Long, MD

## 2020-02-25 ENCOUNTER — Inpatient Hospital Stay: Payer: Medicare HMO

## 2020-02-25 ENCOUNTER — Encounter: Payer: Self-pay | Admitting: Surgery

## 2020-02-25 DIAGNOSIS — J439 Emphysema, unspecified: Secondary | ICD-10-CM

## 2020-02-25 DIAGNOSIS — J9601 Acute respiratory failure with hypoxia: Secondary | ICD-10-CM

## 2020-02-25 DIAGNOSIS — J9621 Acute and chronic respiratory failure with hypoxia: Secondary | ICD-10-CM

## 2020-02-25 DIAGNOSIS — Z720 Tobacco use: Secondary | ICD-10-CM

## 2020-02-25 LAB — CBC WITH DIFFERENTIAL/PLATELET
Abs Immature Granulocytes: 0.06 10*3/uL (ref 0.00–0.07)
Basophils Absolute: 0.1 10*3/uL (ref 0.0–0.1)
Basophils Relative: 1 %
Eosinophils Absolute: 0.4 10*3/uL (ref 0.0–0.5)
Eosinophils Relative: 3 %
HCT: 34.1 % — ABNORMAL LOW (ref 39.0–52.0)
Hemoglobin: 11.8 g/dL — ABNORMAL LOW (ref 13.0–17.0)
Immature Granulocytes: 1 %
Lymphocytes Relative: 10 %
Lymphs Abs: 1.2 10*3/uL (ref 0.7–4.0)
MCH: 29.5 pg (ref 26.0–34.0)
MCHC: 34.6 g/dL (ref 30.0–36.0)
MCV: 85.3 fL (ref 80.0–100.0)
Monocytes Absolute: 1.1 10*3/uL — ABNORMAL HIGH (ref 0.1–1.0)
Monocytes Relative: 9 %
Neutro Abs: 9 10*3/uL — ABNORMAL HIGH (ref 1.7–7.7)
Neutrophils Relative %: 76 %
Platelets: 203 10*3/uL (ref 150–400)
RBC: 4 MIL/uL — ABNORMAL LOW (ref 4.22–5.81)
RDW: 12.6 % (ref 11.5–15.5)
WBC: 11.8 10*3/uL — ABNORMAL HIGH (ref 4.0–10.5)
nRBC: 0 % (ref 0.0–0.2)

## 2020-02-25 LAB — BLOOD GAS, ARTERIAL
Acid-Base Excess: 2 mmol/L (ref 0.0–2.0)
Bicarbonate: 26.5 mmol/L (ref 20.0–28.0)
FIO2: 0.36
O2 Saturation: 92.1 %
Patient temperature: 37
pCO2 arterial: 40 mmHg (ref 32.0–48.0)
pH, Arterial: 7.43 (ref 7.350–7.450)
pO2, Arterial: 62 mmHg — ABNORMAL LOW (ref 83.0–108.0)

## 2020-02-25 MED ORDER — BISACODYL 10 MG RE SUPP
10.0000 mg | Freq: Once | RECTAL | Status: AC
  Administered 2020-02-25: 10 mg via RECTAL
  Filled 2020-02-25: qty 1

## 2020-02-25 MED ORDER — SODIUM CHLORIDE 0.9 % IV SOLN
12.5000 mg | Freq: Three times a day (TID) | INTRAVENOUS | Status: DC | PRN
  Administered 2020-02-25 – 2020-02-26 (×2): 12.5 mg via INTRAVENOUS
  Filled 2020-02-25 (×3): qty 0.5

## 2020-02-25 MED ORDER — IOHEXOL 350 MG/ML SOLN
75.0000 mL | Freq: Once | INTRAVENOUS | Status: AC | PRN
  Administered 2020-02-25: 75 mL via INTRAVENOUS

## 2020-02-25 MED ORDER — SODIUM CHLORIDE 0.9 % IV SOLN
INTRAVENOUS | Status: DC | PRN
  Administered 2020-02-25: 1000 mL via INTRAVENOUS
  Administered 2020-02-26 – 2020-02-27 (×3): 250 mL via INTRAVENOUS

## 2020-02-25 NOTE — Progress Notes (Signed)
SATURATION QUALIFICATIONS: (This note is used to comply with regulatory documentation for home oxygen)  Patient Saturations on Room Air at Rest = 84%  Patient Saturations on Room Air while Ambulating = 86%  Patient Saturations on 6 Liters of oxygen while Ambulating = 92%  Please briefly explain why patient needs home oxygen: COPD  Fuller Mandril, RN

## 2020-02-25 NOTE — Consult Note (Addendum)
[facility] Medical Consultation  Damire Wojtaszek [id number] DOB: 1956-02-10 DOA: 02/21/2020 PCP: Adin Hector, MD   Requesting physician: Dr Lysle Pearl Date of consultation: 02/25/20 Reason for consultation: Hypoxemia  Impression/Recommendations Principal Problem:   Acute on chronic respiratory failure with hypoxia [facility]) Active Problems:   Emphysema lung ([place])   Tobacco abuse   Chronic cholecystitis   Acute cholecystitis    1. Acute respiratory failure Medical consult requested for evaluation of hypoxia.  Patient is status post cholecystectomy and requires oxygen supplementation to maintain pulse oximetry greater than 92%.  He is currently on 6 L of oxygen with pulse oximetry of 88%.  He appears comfortable sitting in bed and denies having any shortness of breath.  His main complaint is hiccups.  He denies home oxygen use but has a history of nicotine use and chest x-ray is consistent with emphysema.  CT scan also showed bilateral atelectasis.  Patient has not been fully compliant with incentive spirometer at his bedside. For this patient who is status post recent surgery and has hypoxia will be prudent to rule out venous thromboembolic disease and if the CAT scan is negative then his hypoxia most likely secondary to his history of COPD and he likely will need home oxygen upon discharge. Obtain arterial blood gas   I will followup again tomorrow. Please contact me if I can be of assistance in the meanwhile. Thank you for this consultation.  Chief Complaint: Hypoxia  HPI: Patient is a 64 year old Caucasian male admitted to the surgical service and he is postop day 3 status post robotic assisted laparoscopic cholecystectomy.  Patient has had significant pain postoperatively which has been managed by the surgeons and now has hiccups.  Consult was requested because patient is requiring 6 L of oxygen to maintain pulse oximetry greater than 92%.  Patient is seen and examined at  bedside in even though he is in pain he states that it is improved and his main concern is the hiccups. He does not use home oxygen He has constipation but denies having any nausea, no vomiting, no fever, no chills, no cough, no headache, no dizziness  Review of Systems:    Past Medical History:  Diagnosis Date  . Allergic state   . Arthritis   . Asthma   . B12 deficiency   . Complication of anesthesia    Difficult to wake up with anesthesia from colonoscopy  . COPD (chronic obstructive pulmonary disease) ([place])   . Coronary artery disease   . Dysrhythmia   . Edema   . GERD (gastroesophageal reflux disease)   . Headache   . History of kidney stones   . Hypertension   . Palpitations   . Pulmonary nodules   . Sleep apnea    Past Surgical History:  Procedure Laterality Date  . COLONOSCOPY    . COLONOSCOPY WITH PROPOFOL N/A 04/09/2016   Procedure: COLONOSCOPY WITH PROPOFOL;  Surgeon: Lollie Sails, MD;  Location: [facility] ENDOSCOPY;  Service: Endoscopy;  Laterality: N/A;  . TEE WITHOUT CARDIOVERSION N/A 02/21/2019   Procedure: TRANSESOPHAGEAL ECHOCARDIOGRAM (TEE);  Surgeon: Corey Skains, MD;  Location: [facility] ORS;  Service: Cardiovascular;  Laterality: N/A;   Social History:  reports that he quit smoking about 2 years ago. His smoking use included cigarettes. He has a 69.00 pack-year smoking history. He has never used smokeless tobacco. He reports that he does not drink alcohol or use drugs.  Allergies  Allergen Reactions  . Amlodipine Swelling  Makes his ankles and legs swell.  . Codeine Other (See Comments)    Gives him a Headache.   Family History  Problem Relation Age of Onset  . Prostate cancer Neg Hx   . Bladder Cancer Neg Hx   . Kidney cancer Neg Hx     Prior to Admission medications   Medication Sig Start Date End Date Taking? Authorizing Provider  acetaminophen (TYLENOL) 500 MG tablet Take 1,000 mg by mouth every 6 (six) hours as needed (for pain.).   Yes  [provider]  albuterol (PROVENTIL HFA;VENTOLIN HFA) 108 one 1 day (90 Base) MCG/ACT inhaler Inhale 2 puffs into the lungs every 6 (six) hours as needed for wheezing or shortness of breath.   Yes [provider]  atorvastatin (LIPITOR) 40 MG tablet Take 1 tablet (40 mg total) by mouth daily at 6 PM. 12/18/18 02/11/20 Yes Sainani, Belia Heman, MD  Cholecalciferol (VITAMIN D3) 50 MCG (2000 UT) capsule Take 2,000 Units by mouth daily.    Yes [provider]  ELIQUIS 5 MG TABS tablet Take 5 mg by mouth 2 (two) times daily. 01/28/20  Yes [provider]  fluticasone (FLONASE) 50 MCG/ACT nasal spray Place 1 spray into both nostrils daily as needed (hoarse voice).  12/05/19  Yes [provider]  mirtazapine (REMERON) 7.5 MG tablet Take 7.5 mg by mouth at bedtime as needed (anxiousness/rest).    Yes [provider]  pantoprazole (PROTONIX) 40 MG tablet Take 40 mg by mouth 2 (two) times a day. 01/27/19  Yes [provider]  potassium chloride (KLOR-CON) 10 MEQ tablet Take 10 mEq by mouth daily. 05/28/19  Yes [provider]  telmisartan (MICARDIS) 80 MG tablet Take 80 mg by mouth daily. 12/21/18  Yes [provider]  TRELEGY ELLIPTA 100-62.5-25 MCG/INH AEPB Inhale 1 puff into the lungs daily. 12/01/19  Yes [provider]  vitamin B-12 (CYANOCOBALAMIN) 1000 MCG tablet Take 1,000 mcg by mouth daily.   Yes [provider]  acetaminophen (TYLENOL) 325 MG tablet Take 2 tablets (650 mg total) by mouth every 8 (eight) hours as needed for mild pain. 02/22/20 03/23/20  Lysle Pearl, Isami, DO  docusate sodium (COLACE) 100 MG capsule Take 1 capsule (100 mg total) by mouth 2 (two) times daily as needed for up to 10 days for mild constipation. 02/22/20 03/03/20  Lysle Pearl, Isami, DO  ibuprofen (ADVIL) 800 MG tablet Take 1 tablet (800 mg total) by mouth every 8 (eight) hours as needed for mild pain or moderate pain. 02/22/20   Lysle Pearl, Isami, DO   traMADol (ULTRAM) 50 MG tablet Take 1 tablet (50 mg total) by mouth every 6 (six) hours as needed for up to 10 doses for severe pain. 02/22/20   Benjamine Sprague, DO   Physical Exam: Blood pressure 113/74, pulse 80, temperature 98.4 F (36.9 C), temperature source Oral, resp. rate 20, height 5\' 7"  (1.702 m), weight 86.2 kg, SpO2 98 %. Vitals:   02/25/20 1044 02/25/20 1327  BP:  113/74  Pulse:  80  Resp:  20  Temp:  98.4 F (36.9 C)  SpO2: 92% 98%     General:  Appears uncomfortable  Eyes: Pale conjunctiva  ENT: Normal  Neck: Supple, No JVD  Cardiovascular: Regular rate and rhythm S1, S2  Respiratory: Bilateral air entry  Abdomen: Hypoactive bowel sounds, central adiposity, tender right upper quadrant  Skin: Warm and dry  Musculoskeletal: Within normal limits  Psychiatric: Normal affect  Neurologic: Able to move all  his extremities  Labs on Admission:  Basic Metabolic Panel: Recent Labs  Lab 02/22/20 0350 02/23/20 0521  NA 140 136  K 4.7 4.6  CL 109 103  CO2 23 27  GLUCOSE 139* 115*  BUN 21 19  CREATININE 0.98 0.97  CALCIUM 8.6* 8.5*   Liver Function Tests: Recent Labs  Lab 02/22/20 1436  AST 42*  ALT 47*  ALKPHOS 255*  BILITOT 1.1  PROT 7.2  ALBUMIN 3.7   No results for input(s): LIPASE, AMYLASE in the last 168 hours. No results for input(s): AMMONIA in the last 168 hours. CBC: Recent Labs  Lab 02/22/20 0350 02/22/20 1436 02/23/20 0521 02/24/20 0600 02/25/20 0535  WBC 13.6*  --  12.0* 12.6* 11.8*  NEUTROABS  --   --  8.9* 9.1* 9.0*  HGB 12.5* 12.9* 12.8* 12.4* 11.8*  HCT 35.8* 36.3* 37.6* 35.7* 34.1*  MCV 84.6  --  86.4 85.4 85.3  PLT 215  --  210 193 203   Cardiac Enzymes: No results for input(s): CKTOTAL, CKMB, CKMBINDEX, TROPONINI in the last 168 hours. BNP: Invalid input(s): POCBNP CBG: No results for input(s): GLUCAP in the last 168 hours.  Radiological Exams on Admission: CT ABDOMEN PELVIS W CONTRAST  Result Date:  02/24/2020 CLINICAL DATA:  Recent gallbladder surgery with persistent abdominal pain EXAM: CT ABDOMEN AND PELVIS WITH CONTRAST TECHNIQUE: Multidetector CT imaging of the abdomen and pelvis was performed using the standard protocol following bolus administration of intravenous contrast. CONTRAST:  114mL OMNIPAQUE IOHEXOL 300 MG/ML  SOLN COMPARISON:  CT abdomen pelvis-02/06/2019; nuclear medicine HIDA scan-12/12/2019 FINDINGS: Lower chest: Limited visualization of the lower thorax demonstrates bibasilar subsegmental atelectasis, right greater than left. No pleural effusion. Normal heart size. Calcifications within the aortic valve leaflets. Calcifications within the mitral valve annulus. Coronary artery calcifications. No pericardial effusion. Hepatobiliary: Normal hepatic contour. Post cholecystectomy. No intra or extrahepatic bili duct dilatation. Large bore surgical drainage catheter courses about the lateral aspect of the right lobe of the liver. There is a trace amount of perihepatic fluid without definable/drainable fluid collection. Pancreas: Normal appearance of the pancreas. Spleen: Normal appearance of the spleen. Adrenals/Urinary Tract: There is symmetric enhancement and excretion of the bilateral kidneys. Note is made of a punctate (3 mm) nonobstructing left-sided renal stone (image 43, series 2). No evidence of right-sided nephrolithiasis on this postcontrast examination. No discrete renal lesions. There is a minimal amount of grossly symmetric likely age and body habitus related perinephric stranding. No evidence of urinary obstruction. Normal appearance of the bilateral adrenal glands. Normal appearance of the urinary bladder given degree of distension though there is mass effect upon the undersurface of the urinary bladder by the prostate. Stomach/Bowel: Ingested enteric contrast extends to the level of the mid transverse colon. Moderate colonic stool burden without evidence of enteric obstruction. No  discrete areas of bowel wall thickening. Normal appearance of the terminal ileum and the retrocecal appendix. No pneumoperitoneum, pneumatosis or portal venous gas. Vascular/Lymphatic: There is a large amount of mixed calcified and noncalcified atherosclerotic plaque within normal caliber abdominal aorta, not resulting in a hemodynamically significant stenosis. There is a large amount of eccentric predominantly calcified atherosclerotic plaque involving the bilateral common iliac arteries (axial image 62, series 2), favored to result in a hemodynamically significant stenosis bilaterally, left greater than right, incompletely evaluated on this non CTA examination. No bulky retroperitoneal, mesenteric, pelvic or inguinal lymphadenopathy. Reproductive: Prostate is enlarged with mass effect upon the undersurface of the urinary bladder. Other: Minimal amount  of subcutaneous edema about the midline of the low back. Musculoskeletal: No acute or aggressive osseous abnormalities. IMPRESSION: 1. Post cholecystectomy without evidence of complication. Specifically, no definable/drainable fluid collection to suggest the presence of a postoperative abscess or bile leak. 2. Large bore surgical drainage catheter courses along the right lobe of the liver with trace amount of perihepatic fluid. 3. Large amount of atherosclerotic plaque within normal caliber abdominal aorta. Aortic Atherosclerosis (ICD10-I70.0). 4. Suspected hemodynamically significant narrowings involving the bilateral common iliac arteries, left greater right. Correlation for symptoms of PAD is advised. Further evaluation with the acquisition of nonemergent ABIs could be performed as indicated. 5. Prostatomegaly with mass effect on the undersurface of the urinary bladder. If not recently performed further evaluation with DRE is advised. Electronically Signed   By: Sandi Mariscal M.D.   On: 02/24/2020 13:57   DG Chest Port 1 View  Result Date: 02/25/2020 CLINICAL  DATA:  Status post cardioversion 02/21/2020. EXAM: PORTABLE CHEST 1 VIEW COMPARISON:  PA and lateral chest 02/23/2020 CT chest 10/08/2019. FINDINGS: Lung volumes are low. The lungs are emphysematous. Subsegmental atelectasis in the left lung base is again seen. Small right pleural effusion and basilar atelectasis are also again seen. No pneumothorax. Heart size is normal. Atherosclerosis. IMPRESSION: No change in a small right pleural effusion and bibasilar subsegmental atelectasis. Aortic Atherosclerosis (ICD10-I70.0) and Emphysema (ICD10-J43.9). Electronically Signed   By: Inge Rise M.D.   On: 02/25/2020 14:22    EKG: Independently reviewed.   Time spent: [address] [facility] Pager [phone]  If 7PM-7AM, please contact night-coverage [email or web address] Password [facility] 02/25/2020, 3:32 PM

## 2020-02-25 NOTE — Clinical Social Work Note (Signed)
Notified [employer] of patient's need for home oxygen.  Dayton Scrape, [place]

## 2020-02-26 DIAGNOSIS — R066 Hiccough: Secondary | ICD-10-CM

## 2020-02-26 DIAGNOSIS — J441 Chronic obstructive pulmonary disease with (acute) exacerbation: Secondary | ICD-10-CM

## 2020-02-26 DIAGNOSIS — R911 Solitary pulmonary nodule: Secondary | ICD-10-CM

## 2020-02-26 DIAGNOSIS — R1084 Generalized abdominal pain: Secondary | ICD-10-CM

## 2020-02-26 DIAGNOSIS — I959 Hypotension, unspecified: Secondary | ICD-10-CM

## 2020-02-26 LAB — CBC WITH DIFFERENTIAL/PLATELET
Abs Immature Granulocytes: 0.05 10*3/uL (ref 0.00–0.07)
Basophils Absolute: 0 10*3/uL (ref 0.0–0.1)
Basophils Relative: 0 %
Eosinophils Absolute: 0.4 10*3/uL (ref 0.0–0.5)
Eosinophils Relative: 4 %
HCT: 34.2 % — ABNORMAL LOW (ref 39.0–52.0)
Hemoglobin: 12.4 g/dL — ABNORMAL LOW (ref 13.0–17.0)
Immature Granulocytes: 1 %
Lymphocytes Relative: 11 %
Lymphs Abs: 1.1 10*3/uL (ref 0.7–4.0)
MCH: 30.4 pg (ref 26.0–34.0)
MCHC: 36.3 g/dL — ABNORMAL HIGH (ref 30.0–36.0)
MCV: 83.8 fL (ref 80.0–100.0)
Monocytes Absolute: 1.1 10*3/uL — ABNORMAL HIGH (ref 0.1–1.0)
Monocytes Relative: 10 %
Neutro Abs: 7.5 10*3/uL (ref 1.7–7.7)
Neutrophils Relative %: 74 %
Platelets: 237 10*3/uL (ref 150–400)
RBC: 4.08 MIL/uL — ABNORMAL LOW (ref 4.22–5.81)
RDW: 12.6 % (ref 11.5–15.5)
WBC: 10.1 10*3/uL (ref 4.0–10.5)
nRBC: 0 % (ref 0.0–0.2)

## 2020-02-26 LAB — BLOOD GAS, ARTERIAL
Acid-Base Excess: 2.4 mmol/L — ABNORMAL HIGH (ref 0.0–2.0)
Bicarbonate: 27.2 mmol/L (ref 20.0–28.0)
FIO2: 0.32
O2 Saturation: 93.1 %
Patient temperature: 37
pCO2 arterial: 42 mmHg (ref 32.0–48.0)
pH, Arterial: 7.42 (ref 7.350–7.450)
pO2, Arterial: 66 mmHg — ABNORMAL LOW (ref 83.0–108.0)

## 2020-02-26 MED ORDER — SODIUM CHLORIDE 0.9 % IV BOLUS
250.0000 mL | Freq: Once | INTRAVENOUS | Status: AC
  Administered 2020-02-26: 250 mL via INTRAVENOUS

## 2020-02-26 MED ORDER — METHYLPREDNISOLONE SODIUM SUCC 40 MG IJ SOLR
40.0000 mg | Freq: Two times a day (BID) | INTRAMUSCULAR | Status: DC
  Administered 2020-02-26 – 2020-02-27 (×3): 40 mg via INTRAVENOUS
  Filled 2020-02-26 (×3): qty 1

## 2020-02-26 MED ORDER — SODIUM CHLORIDE 0.9 % IV SOLN
3.0000 g | Freq: Four times a day (QID) | INTRAVENOUS | Status: DC
  Administered 2020-02-26 – 2020-02-27 (×4): 3 g via INTRAVENOUS
  Filled 2020-02-26 (×3): qty 8
  Filled 2020-02-26: qty 3
  Filled 2020-02-26 (×2): qty 8
  Filled 2020-02-26 (×2): qty 3
  Filled 2020-02-26: qty 8

## 2020-02-26 MED ORDER — APIXABAN 5 MG PO TABS
5.0000 mg | ORAL_TABLET | Freq: Two times a day (BID) | ORAL | Status: DC
  Administered 2020-02-26 – 2020-02-27 (×3): 5 mg via ORAL
  Filled 2020-02-26 (×3): qty 1

## 2020-02-26 MED ORDER — SODIUM CHLORIDE 0.9 % IV SOLN
12.5000 mg | Freq: Four times a day (QID) | INTRAVENOUS | Status: DC | PRN
  Administered 2020-02-26: 12.5 mg via INTRAVENOUS
  Filled 2020-02-26 (×2): qty 0.5

## 2020-02-26 NOTE — Care Management Important Message (Signed)
Important Message  Patient Details  Name: Derrick Sheppard MRN: [id number] Date of Birth: 02-Aug-1956   Medicare Important Message Given:  Yes     Dannette Barbara 02/26/2020, 10:44 AM

## 2020-02-26 NOTE — Progress Notes (Signed)
Called to patients room seems to be slightly confused about the time. Upon entering patients oxygen was off. Patient placed back on oxygen at this time. Oxygen saturation 93%, Will continue to monitor.

## 2020-02-26 NOTE — Consult Note (Signed)
Pharmacy Antibiotic Note  Derrick Sheppard is a 64 y.o. male admitted on 02/21/2020 with aspiration pneumonia.  Pharmacy has been consulted for Unasyn dosing.  Plan: Unasyn 3g IV Q6 hours  Height: 5\' 7"  (170.2 cm) Weight: 86.2 kg (190 lb) IBW/kg (Calculated) : 66.1  Temp (24hrs), Avg:98.9 F (37.2 C), Min:98.3 F (36.8 C), Max:99.9 F (37.7 C)  Recent Labs  Lab 02/22/20 0350 02/23/20 0521 02/24/20 0600 02/25/20 0535 02/26/20 0436  WBC 13.6* 12.0* 12.6* 11.8* 10.1  CREATININE 0.98 0.97  --   --   --     Estimated Creatinine Clearance: 79.6 mL/min (by C-G formula based on SCr of 0.97 mg/dL).    Allergies  Allergen Reactions  . Amlodipine Swelling    Makes his ankles and legs swell.  . Codeine Other (See Comments)    Gives him a Headache.    Antimicrobials this admission: Unasyn 5/18 >>    Thank you for allowing pharmacy to be a part of this patient's care.  Walid A Nazari 02/26/2020 11:03 AM

## 2020-02-26 NOTE — Progress Notes (Signed)
Subjective:  CC: Derrick Sheppard is a 64 y.o. male  Hospital stay day 3, 5 Days Post-Op robo lap chole with bleeding concerns  HPI: No acute issues overnight.  Pain continuing to improve but still present.  Hiccups also still episodic.  No symptoms from hypoxic episodes.  ROS:  General: Denies weight loss, weight gain, fatigue, fevers, chills, and night sweats. Heart: Denies chest pain, palpitations, racing heart, irregular heartbeat, leg pain or swelling, and decreased activity tolerance. Respiratory: Denies breathing difficulty, shortness of breath, wheezing, cough, and sputum. GI: Denies change in appetite, heartburn, nausea, vomiting, constipation, diarrhea, and blood in stool. GU: Denies difficulty urinating, pain with urinating, urgency, frequency, blood in urine.   Objective:   Temp:  [98.3 F (36.8 C)-99.9 F (37.7 C)] 98.8 F (37.1 C) (05/18 1156) Pulse Rate:  [83-102] 91 (05/18 1156) Resp:  [20] 20 (05/18 1156) BP: (96-142)/(66-84) 113/66 (05/18 1156) SpO2:  [90 %-94 %] 90 % (05/18 1813)     Height: 5\' 7"  (170.2 cm) Weight: 86.2 kg BMI (Calculated): 29.75   Intake/Output this shift:   Intake/Output Summary (Last 24 hours) at 02/26/2020 1920 Last data filed at 02/26/2020 1800 Gross per 24 hour  Intake 827.88 ml  Output 100 ml  Net 727.88 ml    Constitutional :  alert, cooperative, appears stated age and no distress  Respiratory:  clear to auscultation bilaterally  Cardiovascular:  regular rate and rhythm  Gastrointestinal: soft, no guarding, improving TTP in periumbilical and RUQ region.  bruising present on left flank area.   Skin: Cool and moist. Incisions C/D/I.  Psychiatric: Normal affect, non-agitated, not confused       LABS:  CMP Latest Ref Rng & Units 02/23/2020 02/22/2020 10/06/2019  Glucose 70 - 99 mg/dL 115(H) 139(H) 93  BUN 8 - 23 mg/dL 19 21 11   Creatinine 0.61 - 1.24 mg/dL 0.97 0.98 1.01  Sodium 135 - 145 mmol/L 136 140 140  Potassium 3.5 - 5.1  mmol/L 4.6 4.7 3.0(L)  Chloride 98 - 111 mmol/L 103 109 103  CO2 22 - 32 mmol/L 27 23 27   Calcium 8.9 - 10.3 mg/dL 8.5(L) 8.6(L) 8.8(L)  Total Protein 6.5 - 8.1 g/dL - 7.2 7.6  Total Bilirubin 0.3 - 1.2 mg/dL - 1.1 2.5(H)  Alkaline Phos 38 - 126 U/L - 255(H) 427(H)  AST 15 - 41 U/L - 42(H) 62(H)  ALT 0 - 44 U/L - 47(H) 89(H)   CBC Latest Ref Rng & Units 02/26/2020 02/25/2020 02/24/2020  WBC 4.0 - 10.5 K/uL 10.1 11.8(H) 12.6(H)  Hemoglobin 13.0 - 17.0 g/dL 12.4(L) 11.8(L) 12.4(L)  Hematocrit 39.0 - 52.0 % 34.2(L) 34.1(L) 35.7(L)  Platelets 150 - 400 K/uL 237 203 193    RADS: n/a Assessment:   S/p robo lap chole now developed some hypoxic episode prior to discharge.  hospitalist onboard to further manage, possible pna vs atelectasis causing O2 dependence. Slowly wean as tolerated.    Pain better, likely resolving small fluid collection around liver is cause. Hiccups also likely from it as well.  Ok to d/c once respiratory status is more stable.

## 2020-02-26 NOTE — Progress Notes (Signed)
Patient ID: Derrick Sheppard, male   DOB: [birth year], 64 y.o.   MRN: [id number] [facility] PROGRESS NOTE  Draydon Kano [id number] DOB: 17-Jul-1956 DOA: 02/21/2020 PCP: Adin Hector, MD  HPI/Subjective: Patient feeling a little bit better this morning.  This morning having hiccups.  Some shortness of breath.  Some cough.  Objective: Vitals:   02/26/20 0354 02/26/20 1156  BP: 96/66 113/66  Pulse: 83 91  Resp: 20 20  Temp: 98.3 F (36.8 C) 98.8 F (37.1 C)  SpO2: 92% 94%    Intake/Output Summary (Last 24 hours) at 02/26/2020 1314 Last data filed at 02/26/2020 0900 Gross per 24 hour  Intake 127.6 ml  Output 100 ml  Net 27.6 ml   Filed Weights   02/21/20 0638  Weight: 86.2 kg    ROS: Review of Systems  Constitutional: Negative for fever.  Eyes: Negative for blurred vision.  Respiratory: Positive for cough and shortness of breath.   Cardiovascular: Negative for chest pain.  Gastrointestinal: Positive for abdominal pain. Negative for constipation, diarrhea, nausea and vomiting.  Genitourinary: Negative for dysuria.  Musculoskeletal: Negative for joint pain.  Neurological: Negative for dizziness.   Exam: Physical Exam  Constitutional: He is oriented to person, place, and time.  HENT:  Nose: No mucosal edema.  Mouth/Throat: No oropharyngeal exudate or posterior oropharyngeal edema.  Eyes: Conjunctivae and lids are normal.  Neck: Carotid bruit is not present.  Cardiovascular: S1 normal and S2 normal. Exam reveals no gallop.  No murmur heard. Respiratory: No respiratory distress. He has decreased breath sounds in the right middle field, the right lower field, the left middle field and the left lower field. He has wheezes in the right middle field, the right lower field, the left middle field and the left lower field. He has no rhonchi. He has no rales.  GI: Soft. Bowel sounds are normal. There is abdominal tenderness.  Musculoskeletal:     Right ankle:  No swelling.     Left ankle: No swelling.  Lymphadenopathy:    He has no cervical adenopathy.  Neurological: He is alert and oriented to person, place, and time. No cranial nerve deficit.  Skin: Skin is warm. No rash noted. Nails show no clubbing.  Psychiatric: He has a normal mood and affect.      Data Reviewed: Basic Metabolic Panel: Recent Labs  Lab 02/22/20 0350 02/23/20 0521  NA 140 136  K 4.7 4.6  CL 109 103  CO2 23 27  GLUCOSE 139* 115*  BUN 21 19  CREATININE 0.98 0.97  CALCIUM 8.6* 8.5*   Liver Function Tests: Recent Labs  Lab 02/22/20 1436  AST 42*  ALT 47*  ALKPHOS 255*  BILITOT 1.1  PROT 7.2  ALBUMIN 3.7   CBC: Recent Labs  Lab 02/22/20 0350 02/22/20 0350 02/22/20 1436 02/23/20 0521 02/24/20 0600 02/25/20 0535 02/26/20 0436  WBC 13.6*  --   --  12.0* 12.6* 11.8* 10.1  NEUTROABS  --   --   --  8.9* 9.1* 9.0* 7.5  HGB 12.5*   < > 12.9* 12.8* 12.4* 11.8* 12.4*  HCT 35.8*   < > 36.3* 37.6* 35.7* 34.1* 34.2*  MCV 84.6  --   --  86.4 85.4 85.3 83.8  PLT 215  --   --  210 193 203 237   < > = values in this interval not displayed.     Recent Results (from the past 240 hour(s))  SARS CORONAVIRUS 2 (TAT 6-24  HRS) Nasopharyngeal Nasopharyngeal Swab     Status: None   Collection Time: 02/19/20  9:49 AM   Specimen: Nasopharyngeal Swab  Result Value Ref Range Status   SARS Coronavirus 2 NEGATIVE NEGATIVE Final    Comment: (NOTE) SARS-CoV-2 target nucleic acids are NOT DETECTED. The SARS-CoV-2 RNA is generally detectable in upper and lower respiratory specimens during the acute phase of infection. Negative results do not preclude SARS-CoV-2 infection, do not rule out co-infections with other pathogens, and should not be used as the sole basis for treatment or other patient management decisions. Negative results must be combined with clinical observations, patient history, and epidemiological information. The expected result is Negative. Fact Sheet  for Patients: SugarRoll.be Fact Sheet for Healthcare Providers: https://www.woods-mathews.com/ This test is not yet approved or cleared by the [place] FDA and  has been authorized for detection and/or diagnosis of SARS-CoV-2 by FDA under an Emergency Use Authorization (EUA). This EUA will remain  in effect (meaning this test can be used) for the duration of the COVID-19 declaration under Section 56 4(b)(1) of the Act, 21 U.S.C. section 360bbb-3(b)(1), unless the authorization is terminated or revoked sooner. Performed at [facility], [place] [address] [place], [place] [address]      Studies: CT ANGIO CHEST PE W OR WO CONTRAST  Result Date: 02/25/2020 CLINICAL DATA:  Status post cholecystectomy, oxygen supplementation, hypoxia, PE suspected EXAM: CT ANGIOGRAPHY CHEST WITH CONTRAST TECHNIQUE: Multidetector CT imaging of the chest was performed using the standard protocol during bolus administration of intravenous contrast. Multiplanar CT image reconstructions and MIPs were obtained to evaluate the vascular anatomy. CONTRAST:  78mL OMNIPAQUE IOHEXOL 350 MG/ML SOLN COMPARISON:  10/08/2019 FINDINGS: Cardiovascular: Satisfactory opacification of the pulmonary arteries to the segmental level. No evidence of pulmonary embolism. Mild cardiomegaly. Three-vessel coronary artery calcifications. No pericardial effusion. Aortic atherosclerosis. Mediastinum/Nodes: Numerous prominent mediastinal and right hilar lymph nodes, new compared to prior examination. Thyroid gland, trachea, and esophagus demonstrate no significant findings. Lungs/Pleura: Moderate centrilobular emphysema. Small right pleural effusion with associated atelectasis or consolidation. Bronchial wall thickening and plugging of the dependent right lower lobe bronchi. There is a plaque like subpleural opacity of the dependent left lower lobe, measuring approximately 3.0 x 1.1 cm (series 6, image  57). Upper Abdomen: Postoperative findings of cholecystectomy. Trace perihepatic ascites. Musculoskeletal: No chest wall abnormality. No acute or significant osseous findings. Review of the MIP images confirms the above findings. IMPRESSION: 1. Negative examination for pulmonary embolism. 2. Small right pleural effusion with associated atelectasis or consolidation. 3. Bronchial wall thickening and plugging of the dependent right lower lobe bronchi, concerning for aspiration. 4. There is a plaque like subpleural opacity of the dependent left lower lobe, measuring approximately 3.0 x 1.1 cm, apparently enlarged compared to prior examination dated 10/08/2019, although difficult to compare given expiratory phase technique on current examination. Generally favor round atelectasis, malignancy less favored, although recommend follow-up for this finding at 3-6 months. PET-CT could be elected to definitively characterize for metabolic activity on a nonemergent outpatient basis if desired. 5. Emphysema (ICD10-J43.9). 6. Numerous prominent mediastinal and right hilar lymph nodes, new compared to prior examination, likely reactive. 7. Coronary artery disease.  Aortic Atherosclerosis (ICD10-I70.0). 8. Postoperative findings of cholecystectomy with trace perihepatic ascites in the included upper abdomen. Electronically Signed   By: Eddie Candle M.D.   On: 02/25/2020 16:48   DG Chest Port 1 View  Result Date: 02/25/2020 CLINICAL DATA:  Status post cardioversion 02/21/2020. EXAM: PORTABLE CHEST  1 VIEW COMPARISON:  PA and lateral chest 02/23/2020 CT chest 10/08/2019. FINDINGS: Lung volumes are low. The lungs are emphysematous. Subsegmental atelectasis in the left lung base is again seen. Small right pleural effusion and basilar atelectasis are also again seen. No pneumothorax. Heart size is normal. Atherosclerosis. IMPRESSION: No change in a small right pleural effusion and bibasilar subsegmental atelectasis. Aortic  Atherosclerosis (ICD10-I70.0) and Emphysema (ICD10-J43.9). Electronically Signed   By: Inge Rise M.D.   On: 02/25/2020 14:22    Scheduled Meds: . apixaban  5 mg Oral BID  . atorvastatin  40 mg Oral q1800  . celecoxib  200 mg Oral Daily  . cholecalciferol  2,000 Units Oral Daily  . fluticasone furoate-vilanterol  1 puff Inhalation Daily   And  . umeclidinium bromide  1 puff Inhalation Daily  . ipratropium-albuterol  3 mL Nebulization TID  . methylPREDNISolone (SOLU-MEDROL) injection  40 mg Intravenous Q12H  . pantoprazole  40 mg Oral Daily  . potassium chloride  10 mEq Oral Daily  . vitamin B-12  1,000 mcg Oral Daily   Continuous Infusions: . sodium chloride Stopped (02/26/20 0204)  . ampicillin-sulbactam (UNASYN) IV    . chlorproMAZINE (THORAZINE) IV      Assessment/Plan:  1. Acute hypoxic respiratory failure.  Patient was on 6 L of oxygen this morning and taper down to 3 L.  Continue to taper oxygen as much as possible.  Since the patient does not wear oxygen at home would like to get the patient off oxygen prior to going home.  Check pulse ox on room air tomorrow morning 2. COPD exacerbation plus or minus aspiration pneumonia.  Unasyn added Solu-Medrol added.  Patient already on nebulizers and inhalers. 3. Hiccups on as needed IV Thorazine 4. Abdominal pain status post laparoscopic cholecystectomy 5. Pulmonary nodule measuring 3 x 1.1 cm.  Radiologist commented that has enlarged since previous exam.  Can follow-up with CT scan in 3 to 6 months or a PET CT scan as outpatient 6. Patient on Eliquis 7. Relative hypotension hold blood pressure medications today 8. Hyperlipidemia on Lipitor 9. GERD on Protonix    Code Status:     Code Status Orders  (From admission, onward)         Start     Ordered   02/21/20 1823  Full code  Continuous     02/21/20 1822        Code Status History    Date Active Date Inactive Code Status Order ID Comments User Context    12/18/2018 0947 12/18/2018 1938 Full Code [id number]  Harrie Foreman, MD Inpatient   08/30/2017 2238 09/17/2017 1638 Full Code [id number]  Erroll Luna, MD ED   Advance Care Planning Activity     Family Communication: Wife at bedside Disposition Plan: Status is: Inpatient  Dispo: The patient is from: Home              Anticipated d/c is to: Home              Anticipated d/c date is: 02/27/2020 if able to come off oxygen              Patient currently tapered from 6 L of oxygen down to 3 L of oxygen today.  Since the patient does not wear oxygen at home, I am hopeful to get him off oxygen.  Started Solu-Medrol today for COPD exacerbation.  Consultants:  General surgery  Antibiotics:  Unasyn  Time spent: 28 minutes  Marcia Lepera  [place]

## 2020-02-26 NOTE — Progress Notes (Signed)
Called to patient room with c/o not knowing where he was. Patient found to no have oxygen on. Place on oxygen and reoriented at this point. At this time patient has an oxygen saturation of 93% on 4 L. Will continue to monitor.

## 2020-02-26 NOTE — Progress Notes (Signed)
PT Cancellation Note  Patient Details Name: Derrick Sheppard MRN: [id number] DOB: 02-27-1956   Cancelled Treatment:    Reason Eval/Treat Not Completed: Other (comment). Consult received and chart reviewed. Pt has been ambulatory with RN, just completed walking 2 laps prior to lunch. Observed ambulation in room with steady gait without AD. Reports he has been up in room ad lib. Wife in room and will offer good support at home. Strength WNL. Only complaint is hiccups. Currently down to 3L of O2. No acute PT needs at this time. Will complete current order at this time.   Yalissa Fink 02/26/2020, 1:13 PM  Greggory Stallion, PT, DPT [phone]

## 2020-02-27 DIAGNOSIS — J9601 Acute respiratory failure with hypoxia: Secondary | ICD-10-CM

## 2020-02-27 MED ORDER — AMOXICILLIN-POT CLAVULANATE 875-125 MG PO TABS: 1.0000 | ORAL_TABLET | Freq: Two times a day (BID) | ORAL | 0 refills | Status: AC

## 2020-02-27 NOTE — Progress Notes (Signed)
Patient discharged to home with oxygen. Patient demonstrated oxygen use, turning tank on and off and setting dial appropriately. Patient states vendor reviewed concentrator use with him and his wife who voiced no questions or concerns at discharge.  Reviewed medications and discharge instructions with wife and patient who verbalized understanding. PIV removed with tip intact, site clean, dry and intact with gauze and tape. Assisted to entrance via w/c.

## 2020-02-27 NOTE — Discharge Summary (Signed)
Physician Discharge Summary  Derrick Sheppard [id number] DOB: Dec 10, 1955 DOA: 02/21/2020  PCP: Adin Hector, MD  Admit date: 02/21/2020 Discharge date: 02/27/2020  Admitted From: home Disposition:  home  Recommendations for Outpatient Follow-up:  1. Follow up with PCP in 1-2 weeks 2. Please obtain BMP/CBC in one week 3. Please follow up on patient's oxygen requirement.  He has persistent post-op hypoxia requiring monitoring in hospital, and was discharged on 2 L/min supplemental oxygen.    Home Health: No  Equipment/Devices: Oxygen 2 L/min   Discharge Condition: Stable  CODE STATUS: Full  Diet recommendation: Regular  Discharge Diagnoses: Principal Problem:   Acute respiratory failure with hypoxia (HCC) Active Problems:   Pulmonary nodule   Emphysema lung (HCC)   Tobacco abuse   Chronic cholecystitis   Acute cholecystitis   COPD with acute exacerbation (HCC)   Hiccups   Generalized abdominal pain   Hypotension    Summary of HPI and Hospital Course:   Patient is a 64 year old Caucasian male admitted to the surgical service and he is postop day 3 status post robotic assisted laparoscopic cholecystectomy.  Patient has had significant pain postoperatively which has been managed by the surgeons and now has hiccups.  Consult was requested because patient is requiring 6 L of oxygen to maintain pulse oximetry greater than 92%.  Patient is seen and examined at bedside in even though he is in pain he states that it is improved and his main concern is the hiccups. He does not use home oxygen   Acute respiratory failure with hypoxia - post-op hypoxia following lap chole.  Initially required 6 L/min, since weaned down to 2 L/min.  Patient without shortness of breath, only complaint of hiccups.  CTA chest ruled out PE or pneumonia but did show emphysema and bilateral atelectasis.  Patient given incentive spirometer but has only used it intermittently.  Patient was adamant to go  home, despite not being fully weaned off oxygen.  He and wife report he had home O2 after pneumonia in the past.  Suspect this is all due to underlying COPD/emphysema and atelectasis which is now exacerbated by having surgery.  Patient qualified for 2 L/min home supplemental oxygen which was arranged prior to discharge.  Patient agrees to very close PCP follow up to determine ongoing need for oxygen.      Discharge Instructions   Discharge Instructions    Call MD for:   Complete by: As directed    Continued need to oxygen use after 1 week of discharge.   Your primary care doctor will assist if you need to be continued on oxygen long term.   Call MD for:  extreme fatigue   Complete by: As directed    Call MD for:  persistant dizziness or light-headedness   Complete by: As directed    Call MD for:  temperature >100.4   Complete by: As directed    Diet - low sodium heart healthy   Complete by: As directed    Discharge instructions   Complete by: As directed    Use supplemental oxygen as needed to keep your oxygen saturation >= 90%.   Increase activity slowly   Complete by: As directed      Allergies as of 02/27/2020      Reactions   Amlodipine Swelling   Makes his ankles and legs swell.   Codeine Other (See Comments)   Gives him a Headache.      Medication List  TAKE these medications   acetaminophen 325 MG tablet Commonly known as: Tylenol Take 2 tablets (650 mg total) by mouth every 8 (eight) hours as needed for mild pain. What changed:   medication strength  how much to take  when to take this  reasons to take this   albuterol 108 (90 Base) MCG/ACT inhaler Commonly known as: VENTOLIN HFA Inhale 2 puffs into the lungs every 6 (six) hours as needed for wheezing or shortness of breath.   amoxicillin-clavulanate 875-125 MG tablet Commonly known as: Augmentin Take 1 tablet by mouth every 12 (twelve) hours for 5 days.   atorvastatin 40 MG tablet Commonly known  as: LIPITOR Take 1 tablet (40 mg total) by mouth daily at 6 PM.   docusate sodium 100 MG capsule Commonly known as: Colace Take 1 capsule (100 mg total) by mouth 2 (two) times daily as needed for up to 10 days for mild constipation.   Eliquis 5 MG Tabs tablet Generic drug: apixaban Take 5 mg by mouth 2 (two) times daily.   fluticasone 50 MCG/ACT nasal spray Commonly known as: FLONASE Place 1 spray into both nostrils daily as needed (hoarse voice).   ibuprofen 800 MG tablet Commonly known as: ADVIL Take 1 tablet (800 mg total) by mouth every 8 (eight) hours as needed for mild pain or moderate pain.   mirtazapine 7.5 MG tablet Commonly known as: REMERON Take 7.5 mg by mouth at bedtime as needed (anxiousness/rest).   pantoprazole 40 MG tablet Commonly known as: PROTONIX Take 40 mg by mouth 2 (two) times a day.   potassium chloride 10 MEQ tablet Commonly known as: KLOR-CON Take 10 mEq by mouth daily.   telmisartan 80 MG tablet Commonly known as: MICARDIS Take 80 mg by mouth daily.   traMADol 50 MG tablet Commonly known as: Ultram Take 1 tablet (50 mg total) by mouth every 6 (six) hours as needed for up to 10 doses for severe pain.   Trelegy Ellipta 100-62.5-25 MCG/INH Aepb Generic drug: Fluticasone-Umeclidin-Vilant Inhale 1 puff into the lungs daily.   vitamin B-12 1000 MCG tablet Commonly known as: CYANOCOBALAMIN Take 1,000 mcg by mouth daily.   Vitamin D3 50 MCG (2000 UT) capsule Take 2,000 Units by mouth daily.            Durable Medical Equipment  (From admission, onward)         Start     Ordered   02/27/20 1209  For home use only DME oxygen  Once    Question Answer Comment  Length of Need 6 Months   Mode or (Route) Nasal cannula   Liters per Minute 2   Oxygen delivery system Gas      02/27/20 1208         Follow-up Information    [place], Isami, DO. Go on 03/05/2020.   Specialty: Surgery Why: 8:30am appointment Contact information: [phone] [place] [address] [phone]          Allergies  Allergen Reactions  . Amlodipine Swelling    Makes his ankles and legs swell.  . Codeine Other (See Comments)    Gives him a Headache.       Procedures/Studies: DG Chest 2 View  Result Date: 02/23/2020 CLINICAL DATA:  Former smoker. Status post EXAM: CHEST - 2 VIEW COMPARISON:  10/01/2018 FINDINGS: Normal heart size. Decreased lung volumes. No pleural effusion or. There is atelectasis identified within both lung bases. Chronic interstitial coarsening noted bilaterally with emphysema. IMPRESSION:  Low lung volumes and bibasilar atelectasis. Electronically Signed   By: Kerby Moors M.D.   On: 02/23/2020 08:11   CT ANGIO CHEST PE W OR WO CONTRAST  Result Date: 02/25/2020 CLINICAL DATA:  Status post cholecystectomy, oxygen supplementation, hypoxia, PE suspected EXAM: CT ANGIOGRAPHY CHEST WITH CONTRAST TECHNIQUE: Multidetector CT imaging of the chest was performed using the standard protocol during bolus administration of intravenous contrast. Multiplanar CT image reconstructions and MIPs were obtained to evaluate the vascular anatomy. CONTRAST:  35mL OMNIPAQUE IOHEXOL 350 MG/ML SOLN COMPARISON:  10/08/2019 FINDINGS: Cardiovascular: Satisfactory opacification of the pulmonary arteries to the segmental level. No evidence of pulmonary embolism. Mild cardiomegaly. Three-vessel coronary artery calcifications. No pericardial effusion. Aortic atherosclerosis. Mediastinum/Nodes: Numerous prominent mediastinal and right hilar lymph nodes, new compared to prior examination. Thyroid gland, trachea, and esophagus demonstrate no significant findings. Lungs/Pleura: Moderate centrilobular emphysema. Small right pleural effusion with associated atelectasis or consolidation. Bronchial wall thickening and plugging of the dependent right lower lobe bronchi. There is a plaque like subpleural opacity of the dependent left lower lobe, measuring  approximately 3.0 x 1.1 cm (series 6, image 57). Upper Abdomen: Postoperative findings of cholecystectomy. Trace perihepatic ascites. Musculoskeletal: No chest wall abnormality. No acute or significant osseous findings. Review of the MIP images confirms the above findings. IMPRESSION: 1. Negative examination for pulmonary embolism. 2. Small right pleural effusion with associated atelectasis or consolidation. 3. Bronchial wall thickening and plugging of the dependent right lower lobe bronchi, concerning for aspiration. 4. There is a plaque like subpleural opacity of the dependent left lower lobe, measuring approximately 3.0 x 1.1 cm, apparently enlarged compared to prior examination dated 10/08/2019, although difficult to compare given expiratory phase technique on current examination. Generally favor round atelectasis, malignancy less favored, although recommend follow-up for this finding at 3-6 months. PET-CT could be elected to definitively characterize for metabolic activity on a nonemergent outpatient basis if desired. 5. Emphysema (ICD10-J43.9). 6. Numerous prominent mediastinal and right hilar lymph nodes, new compared to prior examination, likely reactive. 7. Coronary artery disease.  Aortic Atherosclerosis (ICD10-I70.0). 8. Postoperative findings of cholecystectomy with trace perihepatic ascites in the included upper abdomen. Electronically Signed   By: Eddie Candle M.D.   On: 02/25/2020 16:48   CT ABDOMEN PELVIS W CONTRAST  Result Date: 02/24/2020 CLINICAL DATA:  Recent gallbladder surgery with persistent abdominal pain EXAM: CT ABDOMEN AND PELVIS WITH CONTRAST TECHNIQUE: Multidetector CT imaging of the abdomen and pelvis was performed using the standard protocol following bolus administration of intravenous contrast. CONTRAST:  11mL OMNIPAQUE IOHEXOL 300 MG/ML  SOLN COMPARISON:  CT abdomen pelvis-02/06/2019; nuclear medicine HIDA scan-12/12/2019 FINDINGS: Lower chest: Limited visualization of the  lower thorax demonstrates bibasilar subsegmental atelectasis, right greater than left. No pleural effusion. Normal heart size. Calcifications within the aortic valve leaflets. Calcifications within the mitral valve annulus. Coronary artery calcifications. No pericardial effusion. Hepatobiliary: Normal hepatic contour. Post cholecystectomy. No intra or extrahepatic bili duct dilatation. Large bore surgical drainage catheter courses about the lateral aspect of the right lobe of the liver. There is a trace amount of perihepatic fluid without definable/drainable fluid collection. Pancreas: Normal appearance of the pancreas. Spleen: Normal appearance of the spleen. Adrenals/Urinary Tract: There is symmetric enhancement and excretion of the bilateral kidneys. Note is made of a punctate (3 mm) nonobstructing left-sided renal stone (image 43, series 2). No evidence of right-sided nephrolithiasis on this postcontrast examination. No discrete renal lesions. There is a minimal amount of grossly symmetric likely age and body habitus  related perinephric stranding. No evidence of urinary obstruction. Normal appearance of the bilateral adrenal glands. Normal appearance of the urinary bladder given degree of distension though there is mass effect upon the undersurface of the urinary bladder by the prostate. Stomach/Bowel: Ingested enteric contrast extends to the level of the mid transverse colon. Moderate colonic stool burden without evidence of enteric obstruction. No discrete areas of bowel wall thickening. Normal appearance of the terminal ileum and the retrocecal appendix. No pneumoperitoneum, pneumatosis or portal venous gas. Vascular/Lymphatic: There is a large amount of mixed calcified and noncalcified atherosclerotic plaque within normal caliber abdominal aorta, not resulting in a hemodynamically significant stenosis. There is a large amount of eccentric predominantly calcified atherosclerotic plaque involving the bilateral  common iliac arteries (axial image 62, series 2), favored to result in a hemodynamically significant stenosis bilaterally, left greater than right, incompletely evaluated on this non CTA examination. No bulky retroperitoneal, mesenteric, pelvic or inguinal lymphadenopathy. Reproductive: Prostate is enlarged with mass effect upon the undersurface of the urinary bladder. Other: Minimal amount of subcutaneous edema about the midline of the low back. Musculoskeletal: No acute or aggressive osseous abnormalities. IMPRESSION: 1. Post cholecystectomy without evidence of complication. Specifically, no definable/drainable fluid collection to suggest the presence of a postoperative abscess or bile leak. 2. Large bore surgical drainage catheter courses along the right lobe of the liver with trace amount of perihepatic fluid. 3. Large amount of atherosclerotic plaque within normal caliber abdominal aorta. Aortic Atherosclerosis (ICD10-I70.0). 4. Suspected hemodynamically significant narrowings involving the bilateral common iliac arteries, left greater right. Correlation for symptoms of PAD is advised. Further evaluation with the acquisition of nonemergent ABIs could be performed as indicated. 5. Prostatomegaly with mass effect on the undersurface of the urinary bladder. If not recently performed further evaluation with DRE is advised. Electronically Signed   By: Sandi Mariscal M.D.   On: 02/24/2020 13:57   DG Chest Port 1 View  Result Date: 02/25/2020 CLINICAL DATA:  Status post cardioversion 02/21/2020. EXAM: PORTABLE CHEST 1 VIEW COMPARISON:  PA and lateral chest 02/23/2020 CT chest 10/08/2019. FINDINGS: Lung volumes are low. The lungs are emphysematous. Subsegmental atelectasis in the left lung base is again seen. Small right pleural effusion and basilar atelectasis are also again seen. No pneumothorax. Heart size is normal. Atherosclerosis. IMPRESSION: No change in a small right pleural effusion and bibasilar subsegmental  atelectasis. Aortic Atherosclerosis (ICD10-I70.0) and Emphysema (ICD10-J43.9). Electronically Signed   By: Inge Rise M.D.   On: 02/25/2020 14:22       Subjective: Patient seen with wife at bedside.  He says he is going home today "one way or another".  Denies fever/chills, cough, chest pain or SOB.     Discharge Exam: Vitals:   02/27/20 0722 02/27/20 1203  BP:  112/66  Pulse: 76 72  Resp: 16 18  Temp:  (!) 97.3 F (36.3 C)  SpO2: (!) 89% 95%   Vitals:   02/26/20 2136 02/27/20 0616 02/27/20 0722 02/27/20 1203  BP: 124/70 123/67  112/66  Pulse: 87 69 76 72  Resp: 20 18 16 18   Temp: 98.6 F (37 C) 97.7 F (36.5 C)  (!) 97.3 F (36.3 C)  TempSrc: Oral Oral  Oral  SpO2: 94% 91% (!) 89% 95%  Weight:      Height:        General: Pt is alert, awake, not in acute distress Cardiovascular: RRR, S1/S2 +, no rubs, no gallops Respiratory: poor air movement but clear bilaterally, no wheezing,  no rhonchi Abdominal: Soft, NT, ND, bowel sounds + Extremities: no edema, no cyanosis    The results of significant diagnostics from this hospitalization (including imaging, microbiology, ancillary and laboratory) are listed below for reference.     Microbiology: Recent Results (from the past 240 hour(s))  SARS CORONAVIRUS 2 (TAT 6-24 HRS) Nasopharyngeal Nasopharyngeal Swab     Status: None   Collection Time: 02/19/20  9:49 AM   Specimen: Nasopharyngeal Swab  Result Value Ref Range Status   SARS Coronavirus 2 NEGATIVE NEGATIVE Final    Comment: (NOTE) SARS-CoV-2 target nucleic acids are NOT DETECTED. The SARS-CoV-2 RNA is generally detectable in upper and lower respiratory specimens during the acute phase of infection. Negative results do not preclude SARS-CoV-2 infection, do not rule out co-infections with other pathogens, and should not be used as the sole basis for treatment or other patient management decisions. Negative results must be combined with clinical  observations, patient history, and epidemiological information. The expected result is Negative. Fact Sheet for Patients: SugarRoll.be Fact Sheet for Healthcare Providers: https://www.woods-mathews.com/ This test is not yet approved or cleared by the [place] FDA and  has been authorized for detection and/or diagnosis of SARS-CoV-2 by FDA under an Emergency Use Authorization (EUA). This EUA will remain  in effect (meaning this test can be used) for the duration of the COVID-19 declaration under Section 56 4(b)(1) of the Act, 21 U.S.C. section 360bbb-3(b)(1), unless the authorization is terminated or revoked sooner. Performed at [facility], [place] [address] [place], [place] [address]      Labs: BNP (last 3 results) No results for input(s): BNP in the last 8760 hours. Basic Metabolic Panel: Recent Labs  Lab 02/22/20 0350 02/23/20 0521  NA 140 136  K 4.7 4.6  CL 109 103  CO2 23 27  GLUCOSE 139* 115*  BUN 21 19  CREATININE 0.98 0.97  CALCIUM 8.6* 8.5*   Liver Function Tests: Recent Labs  Lab 02/22/20 1436  AST 42*  ALT 47*  ALKPHOS 255*  BILITOT 1.1  PROT 7.2  ALBUMIN 3.7   No results for input(s): LIPASE, AMYLASE in the last 168 hours. No results for input(s): AMMONIA in the last 168 hours. CBC: Recent Labs  Lab 02/22/20 0350 02/22/20 0350 02/22/20 1436 02/23/20 0521 02/24/20 0600 02/25/20 0535 02/26/20 0436  WBC 13.6*  --   --  12.0* 12.6* 11.8* 10.1  NEUTROABS  --   --   --  8.9* 9.1* 9.0* 7.5  HGB 12.5*   < > 12.9* 12.8* 12.4* 11.8* 12.4*  HCT 35.8*   < > 36.3* 37.6* 35.7* 34.1* 34.2*  MCV 84.6  --   --  86.4 85.4 85.3 83.8  PLT 215  --   --  210 193 203 237   < > = values in this interval not displayed.   Cardiac Enzymes: No results for input(s): CKTOTAL, CKMB, CKMBINDEX, TROPONINI in the last 168 hours. BNP: Invalid input(s): POCBNP CBG: No results for input(s): GLUCAP in the last 168  hours. D-Dimer No results for input(s): DDIMER in the last 72 hours. Hgb A1c No results for input(s): HGBA1C in the last 72 hours. Lipid Profile No results for input(s): CHOL, HDL, LDLCALC, TRIG, CHOLHDL, LDLDIRECT in the last 72 hours. Thyroid function studies No results for input(s): TSH, T4TOTAL, T3FREE, THYROIDAB in the last 72 hours.  Invalid input(s): FREET3 Anemia work up No results for input(s): VITAMINB12, FOLATE, FERRITIN, TIBC, IRON, RETICCTPCT in the last 72 hours.  Urinalysis    Component Value Date/Time   COLORURINE YELLOW (A) 12/17/2018 2136   APPEARANCEUR CLEAR (A) 12/17/2018 2136   APPEARANCEUR Clear 04/07/2018 0909   LABSPEC 1.010 12/17/2018 2136   PHURINE 6.0 12/17/2018 2136   GLUCOSEU NEGATIVE 12/17/2018 2136   HGBUR NEGATIVE 12/17/2018 2136   BILIRUBINUR NEGATIVE 12/17/2018 2136   BILIRUBINUR Negative 04/07/2018 0909   KETONESUR NEGATIVE 12/17/2018 2136   PROTEINUR NEGATIVE 12/17/2018 2136   NITRITE NEGATIVE 12/17/2018 2136   LEUKOCYTESUR NEGATIVE 12/17/2018 2136   Sepsis Labs Invalid input(s): PROCALCITONIN,  WBC,  LACTICIDVEN Microbiology Recent Results (from the past 240 hour(s))  SARS CORONAVIRUS 2 (TAT 6-24 HRS) Nasopharyngeal Nasopharyngeal Swab     Status: None   Collection Time: 02/19/20  9:49 AM   Specimen: Nasopharyngeal Swab  Result Value Ref Range Status   SARS Coronavirus 2 NEGATIVE NEGATIVE Final    Comment: (NOTE) SARS-CoV-2 target nucleic acids are NOT DETECTED. The SARS-CoV-2 RNA is generally detectable in upper and lower respiratory specimens during the acute phase of infection. Negative results do not preclude SARS-CoV-2 infection, do not rule out co-infections with other pathogens, and should not be used as the sole basis for treatment or other patient management decisions. Negative results must be combined with clinical observations, patient history, and epidemiological information. The expected result is Negative. Fact Sheet  for Patients: SugarRoll.be Fact Sheet for Healthcare Providers: https://www.woods-mathews.com/ This test is not yet approved or cleared by the [place] FDA and  has been authorized for detection and/or diagnosis of SARS-CoV-2 by FDA under an Emergency Use Authorization (EUA). This EUA will remain  in effect (meaning this test can be used) for the duration of the COVID-19 declaration under Section 56 4(b)(1) of the Act, 21 U.S.C. section 360bbb-3(b)(1), unless the authorization is terminated or revoked sooner. Performed at [facility], [place] [address], [place], [place] [address]      Time coordinating discharge: Over 30 minutes  SIGNED:   Ezekiel Slocumb, DO [facility] 02/27/2020, 12:11 PM   If 7PM-7AM, please contact night-coverage [email or web address]

## 2020-02-27 NOTE — Clinical Social Work Note (Addendum)
Per MD, likely discharge home today. Notified [employer] representative regarding home oxygen need. Walking saturations note pending.  Dayton Scrape, CSW [phone]  12:08 pm Walking saturations note is in. Patient only requiring 2 L. CSW notified [employer] representative. MD will enter DME order.  Dayton Scrape, CSW [phone]  12:16 pm Patient has orders to discharge home today. Oxygen will be delivered to the room prior to discharge. No further concerns. CSW signing off.  Dayton Scrape, [place]

## 2020-02-27 NOTE — Progress Notes (Signed)
SATURATION QUALIFICATIONS: (This note is used to comply with regulatory documentation for home oxygen)  Patient Saturations on Room Air at Rest = 90%  Patient Saturations on Room Air while Ambulating = 85%  Patient Saturations on 2 Liters of oxygen while Ambulating = 89%  Please briefly explain why patient needs home oxygen:  Patient desats with exertion requiring oxygen to increase. Patient given approximately 2 minutes and was unable to increase saturations at rest.  Oxygen was placed and saturations increased to 93% on 2 nasal cannula.  Encouraged continued Incentive Spirometry use.

## 2020-02-27 NOTE — Discharge Instructions (Signed)
Cholecystitis  Cholecystitis is irritation and swelling (inflammation) of the gallbladder. The gallbladder is an organ that is shaped like a pear. It is under the liver on the right side of the body. This organ stores bile. Bile helps the body break down (digest) the fats in food. This condition can occur all of a sudden. It needs to be treated. What are the causes? This condition may be caused by stones or lumps that form in the gallbladder (gallstones). Gallstones can block the tube (duct) that carries bile out of your gallbladder. Other causes are:  Damage to the gallbladder due to less blood flow.  Germs in the bile ducts.  Scars or kinks in the bile ducts.  Abnormal growths (tumors) in the liver, pancreas, or gallbladder. What increases the risk? You are more likely to develop this condition if:  You have sickle cell disease.  You take birth control pills.  You use estrogen.  You have alcoholic liver disease.  You have liver cirrhosis.  You are being fed through a vein.  You are very ill.  You do not eat or drink for a long time. This is also called "fasting."  You are overweight (obese).  You lose weight too fast.  You are pregnant.  You have high levels of fat in the blood (triglycerides).  You have irritation and swelling of the pancreas (pancreatitis). What are the signs or symptoms? Symptoms of this condition include:  Pain in the belly (abdomen). Pain is often in the upper right area of the belly.  Tenderness or bloating in the belly.  Feeling sick to your stomach (nauseous).  Throwing up (vomiting).  Fever.  Chills. How is this diagnosed? This condition may be diagnosed with a medical history and exam. You may also have other tests, such as:  Imaging tests. This may include: ? Ultrasound. ? CT scan of the belly. ? Nuclear scan. This is also called a HIDA scan. This scan lets your doctor see the bile as it moves in your  body. ? MRI.  Blood tests. These are done to check: ? Your blood count. The white blood cell count may be higher than normal. ? How well your liver works. How is this treated? This condition may be treated with:  Surgery to take out your gallbladder.  Antibiotic medicines to treat illnesses caused by germs.  Going without food for some time.  Giving fluids through an IV tube.  Medicines to treat pain or throwing up. Follow these instructions at home:  If you had surgery, follow instructions from your doctor about how to care for yourself after you go home. Medicines   Take over-the-counter and prescription medicines only as told by your doctor.  If you were prescribed an antibiotic medicine, take it as told by your doctor. Do not stop taking it even if you start to feel better. General instructions  Follow instructions from your doctor about what to eat or drink. Do not eat or drink anything that makes you sick again.  Do not lift anything that is heavier than 10 lb (4.5 kg) until your doctor says that it is safe.  Do not use any products that contain nicotine or tobacco, such as cigarettes and e-cigarettes. If you need help quitting, ask your doctor.  Keep all follow-up visits as told by your doctor. This is important. Contact a doctor if:  You have pain and your medicine does not help.  You have a fever. Get help right away if:  Your pain moves to: ? Another part of your belly. ? Your back.  Your symptoms do not go away.  You have new symptoms. Summary  Cholecystitis is swelling and irritation of the gallbladder.  This condition may be caused by stones or lumps that form in the gallbladder (gallstones).  Common symptoms are pain in the belly. You may feel sick to your stomach and start throwing up. You may also have a fever and chills.  This condition may be treated with surgery to take out the gallbladder. It may also be treated with medicines, fasting,  and fluids through an IV tube.  Follow what you are told about eating and drinking. Do not eat things that make you sick again. This information is not intended to replace advice given to you by your health care provider. Make sure you discuss any questions you have with your health care provider. Document Revised: 02/03/2018 Document Reviewed: 02/03/2018 Elsevier Patient Education  [employer]. Laparoscopic Cholecystectomy, Care After This sheet gives you information about how to care for yourself after your procedure. Your doctor may also give you more specific instructions. If you have problems or questions, contact your doctor. Follow these instructions at home: Care for cuts from surgery (incisions)   Follow instructions from your doctor about how to take care of your cuts from surgery. Make sure you: ? Wash your hands with soap and water before you change your bandage (dressing). If you cannot use soap and water, use hand sanitizer. ? Change your bandage as told by your doctor. ? Leave stitches (sutures), skin glue, or skin tape (adhesive) strips in place. They may need to stay in place for 2 weeks or longer. If tape strips get loose and curl up, you may trim the loose edges. Do not remove tape strips completely unless your doctor says it is okay.  Do not take baths, swim, or use a hot tub until your doctor says it is okay. OK TO SHOWER 24HRS AFTER YOUR SURGERY.   Check your surgical cut area every day for signs of infection. Check for: ? More redness, swelling, or pain. ? More fluid or blood. ? Warmth. ? Pus or a bad smell. Activity  Do not drive or use heavy machinery while taking prescription pain medicine.  Do not play contact sports until your doctor says it is okay.  Do not drive for 24 hours if you were given a medicine to help you relax (sedative).  Rest as needed. Do not return to work or school until your doctor says it is okay. General instructions .  tylenol  and advil as needed for discomfort.  Please alternate between the two every four hours as needed for pain.   .  Use narcotics, if prescribed, only when tylenol and motrin is not enough to control pain. .  325-650mg  every 8hrs to max of 3000mg /24hrs (including the 325mg  in every norco dose) for the tylenol.   .  Advil up to 800mg  per dose every 8hrs as needed for pain.    To prevent or treat constipation while you are taking prescription pain medicine, your doctor may recommend that you: ? Drink enough fluid to keep your pee (urine) clear or pale yellow. ? Take over-the-counter or prescription medicines. ? Eat foods that are high in fiber, such as fresh fruits and vegetables, whole grains, and beans. ? Limit foods that are high in fat and processed sugars, such as fried and sweet foods. Contact a doctor if:  You develop a rash.  You have more redness, swelling, or pain around your surgical cuts.  You have more fluid or blood coming from your surgical cuts.  Your surgical cuts feel warm to the touch.  You have pus or a bad smell coming from your surgical cuts.  You have a fever.  One or more of your surgical cuts breaks open. Get help right away if:  You have trouble breathing.  You have chest pain.  You have pain that is getting worse in your shoulders.  You faint or feel dizzy when you stand.  You have very bad pain in your belly (abdomen).  You are sick to your stomach (nauseous) for more than one day.  You have throwing up (vomiting) that lasts for more than one day.  You have leg pain. This information is not intended to replace advice given to you by your health care provider. Make sure you discuss any questions you have with your health care provider. Document Released: 07/06/2008 Document Revised: 04/17/2016 Document Reviewed: 03/15/2016 Elsevier Interactive Patient Education  2019 [employer].

## 2020-03-31 ENCOUNTER — Telehealth: Payer: Self-pay

## 2020-03-31 DIAGNOSIS — Z87891 Personal history of nicotine dependence: Secondary | ICD-10-CM

## 2020-03-31 DIAGNOSIS — R918 Other nonspecific abnormal finding of lung field: Secondary | ICD-10-CM

## 2020-03-31 NOTE — Telephone Encounter (Signed)
Message left notifying patient that it is time to schedule the low dose lung cancer screening CT scan.  Instructed patient to return call to Shawn Perkins at [phone] to verify information prior to CT scan being scheduled.    

## 2020-04-02 NOTE — Addendum Note (Signed)
Addended by: Lieutenant Diego on: 04/02/2020 12:24 PM   Modules accepted: Orders

## 2020-04-02 NOTE — Telephone Encounter (Signed)
Contacted and scheduled for LCS nodule follow up 

## 2020-04-09 ENCOUNTER — Ambulatory Visit
Admission: RE | Admit: 2020-04-09 | Discharge: 2020-04-09 | Disposition: A | Discharge status: Home / Self Care | Payer: Medicare HMO | Source: Ambulatory Visit | Attending: Oncology | Admitting: Oncology

## 2020-04-09 ENCOUNTER — Other Ambulatory Visit: Payer: Self-pay

## 2020-04-09 DIAGNOSIS — R918 Other nonspecific abnormal finding of lung field: Secondary | ICD-10-CM | POA: Insufficient documentation

## 2020-04-09 DIAGNOSIS — Z87891 Personal history of nicotine dependence: Secondary | ICD-10-CM

## 2020-04-10 ENCOUNTER — Telehealth: Payer: Self-pay | Admitting: *Deleted

## 2020-04-10 NOTE — Telephone Encounter (Signed)
Attempted to contact patient to review results of recent CT scan and recommended next steps. There is no answer or voicemail option. Will attempt again.

## 2020-04-10 NOTE — Telephone Encounter (Signed)
Called report  IMPRESSION: 1. Lung-RADS 4B, suspicious. Enlarging subpleural left lower lobe lesion over the last 3 screening study since 07/04/2019. Additional imaging evaluation by PET-CT or consultation with Pulmonology or Thoracic Surgery recommended. 2.  Emphysema (ICD10-J43.9) and Aortic Atherosclerosis (ICD10-170.0)  These results will be called to the ordering clinician or representative by the Radiologist Assistant, and communication documented in the PACS or [employer].   Electronically Signed   By: Misty Stanley M.D.   On: 04/10/2020 09:02

## 2020-04-10 NOTE — Progress Notes (Signed)
CT results. Let me know if you need me to put in PET results.   Faythe Casa, NP 04/10/2020 9:07 AM

## 2020-04-11 ENCOUNTER — Encounter: Payer: Self-pay | Admitting: Oncology

## 2020-04-11 ENCOUNTER — Telehealth: Payer: Self-pay | Admitting: *Deleted

## 2020-04-11 ENCOUNTER — Encounter: Payer: Self-pay | Admitting: *Deleted

## 2020-04-11 ENCOUNTER — Other Ambulatory Visit: Payer: Self-pay | Admitting: Oncology

## 2020-04-11 DIAGNOSIS — R918 Other nonspecific abnormal finding of lung field: Secondary | ICD-10-CM

## 2020-04-11 NOTE — Progress Notes (Signed)
Re: Abnormal LDCT imaging  PET scan ordered.   Faythe Casa, NP 04/11/2020 8:14 AM

## 2020-04-11 NOTE — Telephone Encounter (Signed)
Left voicemail in attempt to review the lung screening results and recommended next steps for evaluation of abnormal findings. Also sent mychart message to patient.   A PET scan has been ordered, authorized, and scheduled for next week in anticipation of workup.   Notified PCP of these results and this plan.    IMPRESSION: 1. Lung-RADS 4B, suspicious. Enlarging subpleural left lower lobe lesion over the last 3 screening study since 07/04/2019. Additional imaging evaluation by PET-CT or consultation with Pulmonology or Thoracic Surgery recommended. 2.  Emphysema (ICD10-J43.9) and Aortic Atherosclerosis (ICD10-170.0)

## 2020-04-15 NOTE — Telephone Encounter (Signed)
Patient responded to [employer]. Reviewed results and PET scan. Patient is in agreement with this plan.   IMPRESSION: 1. Lung-RADS 4B, suspicious. Enlarging subpleural left lower lobe lesion over the last 3 screening study since 07/04/2019. Additional imaging evaluation by PET-CT or consultation with Pulmonology or Thoracic Surgery recommended. 2.  Emphysema (ICD10-J43.9) and Aortic Atherosclerosis (ICD10-170.0)

## 2020-04-17 ENCOUNTER — Other Ambulatory Visit: Payer: Self-pay

## 2020-04-17 ENCOUNTER — Ambulatory Visit: Payer: Medicare HMO

## 2020-04-17 ENCOUNTER — Encounter
Admission: RE | Admit: 2020-04-17 | Discharge: 2020-04-17 | Disposition: A | Discharge status: Home / Self Care | Payer: Medicare HMO | Source: Ambulatory Visit | Attending: Oncology | Admitting: Oncology

## 2020-04-17 DIAGNOSIS — J984 Other disorders of lung: Secondary | ICD-10-CM | POA: Diagnosis not present

## 2020-04-17 DIAGNOSIS — R918 Other nonspecific abnormal finding of lung field: Secondary | ICD-10-CM | POA: Insufficient documentation

## 2020-04-17 LAB — GLUCOSE, CAPILLARY: Glucose-Capillary: 100 mg/dL — ABNORMAL HIGH (ref 70–99)

## 2020-04-17 MED ORDER — FLUDEOXYGLUCOSE F - 18 (FDG) INJECTION
9.8000 | Freq: Once | INTRAVENOUS | Status: AC | PRN
  Administered 2020-04-17: 10.27 via INTRAVENOUS

## 2020-04-22 ENCOUNTER — Encounter: Payer: Self-pay | Admitting: *Deleted

## 2020-04-22 NOTE — Progress Notes (Signed)
  Oncology Nurse Navigator Documentation  Navigator Location: [facility] (04/22/20 0800) Referral Date to RadOnc/MedOnc: 04/21/20 (04/22/20 0800) )Navigator Encounter Type: Appt/Treatment Plan Review (04/22/20 0800)   Abnormal Finding Date: 04/10/20 (04/22/20 0800)                     Barriers/Navigation Needs: Coordination of Care (04/22/20 0800)   Interventions: Coordination of Care;Referrals (04/22/20 0800) Referrals: Other (thoracic surgery) (04/22/20 0800) Coordination of Care: Appts (04/22/20 0800)                  Time Spent with Patient: 30 (04/22/20 0800)

## 2020-04-24 ENCOUNTER — Other Ambulatory Visit: Payer: Medicare HMO

## 2020-04-25 ENCOUNTER — Ambulatory Visit: Payer: Self-pay | Admitting: Cardiothoracic Surgery

## 2020-04-25 ENCOUNTER — Inpatient Hospital Stay: Payer: Medicare HMO | Admitting: Internal Medicine

## 2020-04-25 NOTE — Progress Notes (Signed)
Tumor Board Documentation  Marcellino Fidalgo was presented by Dr Rogue Bussing at our Tumor Board on 04/24/2020, which included representatives from medical oncology, radiation oncology, internal medicine, navigation, pathology, radiology, genetics, research, palliative care, pulmonology.  Vonnie currently presents as a new patient, for discussion with history of the following treatments: active survellience.  Additionally, we reviewed previous medical and familial history, history of present illness, and recent lab results along with all available histopathologic and imaging studies. The tumor board considered available treatment options and made the following recommendations: Biopsy (Either Surgical Biopsy or IR) Radiation Oncology wants biopsy prior to radiating  The following procedures/referrals were also placed: No orders of the defined types were placed in this encounter.   Clinical Trial Status: not discussed   Staging used: Not Applicable  National site-specific guidelines   were discussed with respect to the case.  Tumor board is a meeting of clinicians from various specialty areas who evaluate and discuss patients for whom a multidisciplinary approach is being considered. Final determinations in the plan of care are those of the provider(s). The responsibility for follow up of recommendations given during tumor board is that of the provider.   Today's extended care, comprehensive team conference, Tilman was not present for the discussion and was not examined.   Multidisciplinary Tumor Board is a multidisciplinary case peer review process.  Decisions discussed in the Multidisciplinary Tumor Board reflect the opinions of the specialists present at the conference without having examined the patient.  Ultimately, treatment and diagnostic decisions rest with the primary provider(s) and the patient.

## 2020-04-28 ENCOUNTER — Inpatient Hospital Stay: Payer: Medicare HMO | Attending: Internal Medicine | Admitting: Internal Medicine

## 2020-04-28 ENCOUNTER — Other Ambulatory Visit: Payer: Self-pay

## 2020-04-28 ENCOUNTER — Inpatient Hospital Stay: Payer: Medicare HMO

## 2020-04-28 ENCOUNTER — Encounter: Payer: Self-pay | Admitting: *Deleted

## 2020-04-28 ENCOUNTER — Encounter: Payer: Self-pay | Admitting: Internal Medicine

## 2020-04-28 DIAGNOSIS — Z87828 Personal history of other (healed) physical injury and trauma: Secondary | ICD-10-CM | POA: Insufficient documentation

## 2020-04-28 DIAGNOSIS — Z800 Family history of malignant neoplasm of digestive organs: Secondary | ICD-10-CM | POA: Insufficient documentation

## 2020-04-28 DIAGNOSIS — R911 Solitary pulmonary nodule: Secondary | ICD-10-CM | POA: Diagnosis present

## 2020-04-28 DIAGNOSIS — Z87891 Personal history of nicotine dependence: Secondary | ICD-10-CM | POA: Insufficient documentation

## 2020-04-28 NOTE — Progress Notes (Signed)
[place] NOTE  Patient Care Team: Adin Hector, MD as PCP - General (Internal Medicine)  CHIEF COMPLAINTS/PURPOSE OF CONSULTATION: lung nodule/mass  # July 2021- LLL [Dr.Fleming] ~ 35mm [LCSP]  #COPD; CPAP-OSA; Traumatic right Pneumothorax [5956; complicated-mechanical ventilation]; former smoker. Oncology History   No history exists.     HISTORY OF PRESENTING ILLNESS:  Derrick Sheppard 64 y.o.  male history of smoking is here for further evaluation and recommendations for lung mass/nodule.  Patient states that he has been under surveillance with the lung cancer screening program for the last 2 to 3 years.  Last year also noted to have progressive increase in the size of the left lower lobe lung nodule.  PET scan done-shows increased uptake; no other intra thoracic or extrathoracic disease.   Patient complains of mild shortness of breath on exertion.  Denies any nausea vomiting abdominal pain or headaches.  Chronic mild joint pains.   Patient had previous complications [3875] with traumatic pneumothorax post rib fractures-as per patient complicated by prolonged hospital stay; mechanical ventilation and PEG tube placement.  Not on home oxygen.  Sleep apnea on CPAP at night.  Review of Systems  Constitutional: Negative for chills, diaphoresis, fever, malaise/fatigue and weight loss.  HENT: Negative for nosebleeds and sore throat.   Eyes: Negative for double vision.  Respiratory: Positive for cough, shortness of breath and wheezing. Negative for hemoptysis and sputum production.   Cardiovascular: Negative for chest pain, palpitations, orthopnea and leg swelling.  Gastrointestinal: Negative for abdominal pain, blood in stool, constipation, diarrhea, heartburn, melena, nausea and vomiting.  Genitourinary: Negative for dysuria, frequency and urgency.  Musculoskeletal: Positive for joint pain. Negative for back pain.  Skin: Negative.  Negative for itching  and rash.  Neurological: Negative for dizziness, tingling, focal weakness, weakness and headaches.  Endo/Heme/Allergies: Does not bruise/bleed easily.  Psychiatric/Behavioral: Negative for depression. The patient is not nervous/anxious and does not have insomnia.      MEDICAL HISTORY:  Past Medical History:  Diagnosis Date  . Allergic state   . Arthritis   . Asthma   . B12 deficiency   . Complication of anesthesia    Difficult to wake up with anesthesia from colonoscopy  . COPD (chronic obstructive pulmonary disease) ([place])   . Coronary artery disease   . Dysrhythmia   . Edema   . GERD (gastroesophageal reflux disease)   . Headache   . History of kidney stones   . Hypertension   . Palpitations   . Pulmonary nodules   . Sleep apnea     SURGICAL HISTORY: Past Surgical History:  Procedure Laterality Date  . COLONOSCOPY    . COLONOSCOPY WITH PROPOFOL N/A 04/09/2016   Procedure: COLONOSCOPY WITH PROPOFOL;  Surgeon: Lollie Sails, MD;  Location: [facility] ENDOSCOPY;  Service: Endoscopy;  Laterality: N/A;  . TEE WITHOUT CARDIOVERSION N/A 02/21/2019   Procedure: TRANSESOPHAGEAL ECHOCARDIOGRAM (TEE);  Surgeon: Corey Skains, MD;  Location: [facility] ORS;  Service: Cardiovascular;  Laterality: N/A;    SOCIAL HISTORY: Social History   Socioeconomic History  . Marital status: Married    Spouse name: Not on file  . Number of children: Not on file  . Years of education: Not on file  . Highest education level: Not on file  Occupational History  . Not on file  Tobacco Use  . Smoking status: Former Smoker    Packs/day: 1.50    Years: 46.00    Pack years: 69.00  Types: Cigarettes    Quit date: 09/01/2017    Years since quitting: 2.6  . Smokeless tobacco: Never Used  Vaping Use  . Vaping Use: Never used  Substance and Sexual Activity  . Alcohol use: No  . Drug use: No  . Sexual activity: Not on file  Other Topics Concern  . Not on file  Social History Narrative   46 years  smoking; quit 2016; no alcohol; lives in [place]. Data processing manager. 1 living daughter-; 5 mins away.    Social Determinants of Health   Financial Resource Strain:   . Difficulty of Paying Living Expenses:   Food Insecurity:   . Worried About Charity fundraiser in the Last Year:   . Arboriculturist in the Last Year:   Transportation Needs:   . Film/video editor (Medical):   Marland Kitchen Lack of Transportation (Non-Medical):   Physical Activity:   . Days of Exercise per Week:   . Minutes of Exercise per Session:   Stress:   . Feeling of Stress :   Social Connections:   . Frequency of Communication with Friends and Family:   . Frequency of Social Gatherings with Friends and Family:   . Attends Religious Services:   . Active Member of Clubs or Organizations:   . Attends Archivist Meetings:   Marland Kitchen Marital Status:   Intimate Partner Violence:   . Fear of Current or Ex-Partner:   . Emotionally Abused:   Marland Kitchen Physically Abused:   . Sexually Abused:     FAMILY HISTORY: Family History  Problem Relation Age of Onset  . Colon cancer Father 50       died of cancer  . Prostate cancer Neg Hx   . Bladder Cancer Neg Hx   . Kidney cancer Neg Hx     ALLERGIES:  is allergic to amlodipine and codeine.  MEDICATIONS:  Current Outpatient Medications  Medication Sig Dispense Refill  . albuterol (PROVENTIL HFA;VENTOLIN HFA) 108 (90 Base) MCG/ACT inhaler Inhale 2 puffs into the lungs every 6 (six) hours as needed for wheezing or shortness of breath.    . Cholecalciferol (VITAMIN D3) 50 MCG (2000 UT) capsule Take 2,000 Units by mouth daily.     Marland Kitchen ELIQUIS 5 MG TABS tablet Take 5 mg by mouth 2 (two) times daily.    . fluticasone (FLONASE) 50 MCG/ACT nasal spray Place 1 spray into both nostrils daily as needed (hoarse voice).     Marland Kitchen ibuprofen (ADVIL) 800 MG tablet Take 1 tablet (800 mg total) by mouth every 8 (eight) hours as needed for mild pain or moderate pain. 30 tablet 0  . mirtazapine (REMERON)  7.5 MG tablet Take 7.5 mg by mouth at bedtime as needed (anxiousness/rest).     . pantoprazole (PROTONIX) 40 MG tablet Take 40 mg by mouth 2 (two) times a day.    . potassium chloride (KLOR-CON) 10 MEQ tablet Take 10 mEq by mouth daily.    Marland Kitchen telmisartan (MICARDIS) 80 MG tablet Take 80 mg by mouth daily.    . TRELEGY ELLIPTA 100-62.5-25 MCG/INH AEPB Inhale 1 puff into the lungs daily.    Marland Kitchen atorvastatin (LIPITOR) 40 MG tablet Take 1 tablet (40 mg total) by mouth daily at 6 PM. 30 tablet 1  . vitamin B-12 (CYANOCOBALAMIN) 1000 MCG tablet Take 1,000 mcg by mouth daily.     No current facility-administered medications for this visit.      Marland Kitchen  PHYSICAL EXAMINATION: ECOG PERFORMANCE  STATUS: 0 - Asymptomatic  Vitals:   04/28/20 0915  BP: 138/75  Pulse: 62  Resp: 18  Temp: 97.6 F (36.4 C)  SpO2: 99%   Filed Weights   04/28/20 0924  Weight: 183 lb 6.4 oz (83.2 kg)    Physical Exam HENT:     Head: Normocephalic and atraumatic.     Mouth/Throat:     Pharynx: No oropharyngeal exudate.  Eyes:     Pupils: Pupils are equal, round, and reactive to light.  Cardiovascular:     Rate and Rhythm: Normal rate and regular rhythm.  Pulmonary:     Effort: No respiratory distress.     Breath sounds: No wheezing.     Comments: Decreased air entry. No wheezing.  Abdominal:     General: Bowel sounds are normal. There is no distension.     Palpations: Abdomen is soft. There is no mass.     Tenderness: There is no abdominal tenderness. There is no guarding or rebound.  Musculoskeletal:        General: No tenderness. Normal range of motion.     Cervical back: Normal range of motion and neck supple.  Skin:    General: Skin is warm.  Neurological:     Mental Status: He is alert and oriented to person, place, and time.  Psychiatric:        Mood and Affect: Affect normal.      LABORATORY DATA:  I have reviewed the data as listed Lab Results  Component Value Date   WBC 10.1 02/26/2020    HGB 12.4 (L) 02/26/2020   HCT 34.2 (L) 02/26/2020   MCV 83.8 02/26/2020   PLT 237 02/26/2020   Recent Labs    10/06/19 1526 02/22/20 0350 02/22/20 1436 02/23/20 0521  NA 140 140  --  136  K 3.0* 4.7  --  4.6  CL 103 109  --  103  CO2 27 23  --  27  GLUCOSE 93 139*  --  115*  BUN 11 21  --  19  CREATININE 1.01 0.98  --  0.97  CALCIUM 8.8* 8.6*  --  8.5*  GFRNONAA >60 >60  --  >60  GFRAA >60 >60  --  >60  PROT 7.6  --  7.2  --   ALBUMIN 3.7  --  3.7  --   AST 62*  --  42*  --   ALT 89*  --  47*  --   ALKPHOS 427*  --  255*  --   BILITOT 2.5*  --  1.1  --   BILIDIR  --   --  0.2  --   IBILI  --   --  0.9  --     RADIOGRAPHIC STUDIES: I have personally reviewed the radiological images as listed and agreed with the findings in the report. NM PET Image Initial (PI) Skull Base To Thigh  Result Date: 04/18/2020 CLINICAL DATA:  Initial treatment strategy for left lower lobe lesion. EXAM: NUCLEAR MEDICINE PET SKULL BASE TO THIGH TECHNIQUE: 10.27 mCi F-18 FDG was injected intravenously. Full-ring PET imaging was performed from the skull base to thigh after the radiotracer. CT data was obtained and used for attenuation correction and anatomic localization. Fasting blood glucose: 100 mg/dl COMPARISON:  Chest CT 04/09/2020 FINDINGS: Mediastinal blood pool activity: SUV max 2.54 Liver activity: SUV max NA NECK: No hypermetabolic lymph nodes in the neck. Incidental CT findings: Left-sided carotid artery calcifications are noted. CHEST: The ill-defined  peripheral/subpleural left lower lobe density measuring 18 mm is hypermetabolic with SUV max of 2.50 and concerning for neoplasm. Recommend biopsy. No enlarged or hypermetabolic mediastinal or hilar lymph nodes to suggest metastatic adenopathy. Small scattered supraclavicular and axillary lymph nodes without hypermetabolism. Stable advanced emphysematous changes and scattered bilateral pulmonary nodules. None of these are hypermetabolic but are below  the limits of PET CT. Recommend continued close surveillance. Incidental CT findings: Stable vascular calcifications and coronary artery calcifications advanced for age. ABDOMEN/PELVIS: No abnormal hypermetabolic activity within the liver, pancreas, adrenal glands, or spleen. No hypermetabolic lymph nodes in the abdomen or pelvis. Incidental CT findings: Age advanced atherosclerotic calcifications involving the aorta and branch vessels but no aneurysm. Mild prostate gland enlargement. SKELETON: No focal hypermetabolic activity to suggest skeletal metastasis. Incidental CT findings: none IMPRESSION: 1. The subpleural left lower lobe lesion is hypermetabolic and worrisome for primary lung neoplasm. Recommend biopsy. 2. No mediastinal or hilar metastatic lymphadenopathy and no findings for metastatic disease involving the abdomen/pelvis or bony structures. 3. Stable emphysematous changes and pulmonary scarring. 4. Stable bilateral pulmonary nodules. Recommend continued surveillance. Electronically Signed   By: Marijo Sanes M.D.   On: 04/18/2020 12:37   CT CHEST LCS NODULE F/U W/O CONTRAST  Result Date: 04/10/2020 CLINICAL DATA:  64 year old male with 42 pack-year history of smoking. Lung cancer screening. EXAM: CT CHEST WITHOUT CONTRAST FOR LUNG CANCER SCREENING NODULE FOLLOW-UP TECHNIQUE: Multidetector CT imaging of the chest was performed following the standard protocol without IV contrast. COMPARISON:  10/08/2019 FINDINGS: Cardiovascular: The heart size is normal. No substantial pericardial effusion. Coronary artery calcification is evident. Atherosclerotic calcification is noted in the wall of the thoracic aorta. Mediastinum/Nodes: No mediastinal lymphadenopathy. No evidence for gross hilar lymphadenopathy although assessment is limited by the lack of intravenous contrast on today's study. The esophagus has normal imaging features. There is no axillary lymphadenopathy. Lungs/Pleura: Centrilobular emphsyema  noted. Bilateral pulmonary nodules again identified. The pleural base left lower lobe soft tissue density identified on the previous study has continued to progress. This was not present on the 05/26/2017 exam and has been slowly progressive over the 07/04/2019 and 10/08/2019 studies. Lesion now demonstrates volume derived equivalent diameter of 16.3 mm (image 200). Upper Abdomen: Subtle nodularity of liver contour raises the question of cirrhosis. Otherwise unremarkable. Musculoskeletal: No worrisome lytic or sclerotic osseous abnormality. IMPRESSION: 1. Lung-RADS 4B, suspicious. Enlarging subpleural left lower lobe lesion over the last 3 screening study since 07/04/2019. Additional imaging evaluation by PET-CT or consultation with Pulmonology or Thoracic Surgery recommended. 2.  Emphysema (ICD10-J43.9) and Aortic Atherosclerosis (ICD10-170.0) These results will be called to the ordering clinician or representative by the Radiologist Assistant, and communication documented in the PACS or [employer]. Electronically Signed   By: Misty Stanley M.D.   On: 04/10/2020 09:02    ASSESSMENT & PLAN:   Nodule of lower lobe of left lung # LLL Lung nodule- ~ 53mm subpleural; increase in size in the last year or so.  PET scan-increased uptake; no other intrathoracic disease or distant metastatic disease.  Clinical stage I.  #Discussed the treatment options of early stage lung cancer would be surgery.  Also discussed options that if patient is not a surgical candidate/or tumor cannot be resected.  The options include -percutaneous CT-guided biopsy to confirm malignancy; and histology.  If patient is not a candidate for CT-guided biopsy at this time [see below]-SBRT as per radiation oncology would be the next best option of treatment.  As per radiation  oncology-if biopsy cannot be done-plan is to repeat a scan in 3 months-and if progressive would recommend SBRT.   #History of traumatic pneumothorax-needing  mechanical ventilation prolonged hospital stay.  Patient reluctant with CT-guided biopsy; given previous complication; which I think is very reasonable/concerning.   #For now proceed with evaluation with thoracic surgery as planned this week.  If not a surgical candidate-we will refer to radiation oncology.   Thank you Drs. Klein/Fleming for allowing me to participate in the care of your pleasant patient. Please do not hesitate to contact me with questions or concerns in the interim.  Discussed with Trinda Pascal.  Discussed the tumor conference.  # DISPOSITION: # follow up TBD- Dr.B  Cc; Drs.Crist Fat  # I reviewed the blood work- with the patient in detail; also reviewed the imaging independently [as summarized above]; and with the patient in detail.    All questions were answered. The patient knows to call the clinic with any problems, questions or concerns.    Cammie Sickle, MD 04/28/2020 12:19 PM

## 2020-04-28 NOTE — Progress Notes (Signed)
  Oncology Nurse Navigator Documentation  Navigator Location: [facility]Med Onc (04/28/20 1100)   )Navigator Encounter Type: Initial MedOnc (04/28/20 1100)                       Treatment Phase: Abnormal Scans (04/28/20 1100) Barriers/Navigation Needs: Coordination of Care (04/28/20 1100)   Interventions: Coordination of Care (04/28/20 1100)   Coordination of Care: Appts (04/28/20 1100)        Acuity: Level 2-Minimal Needs (1-2 Barriers Identified) (04/28/20 1100)  met with patient during initial consult with Dr. Rogue Bussing to discuss recent images and treatment options. All questions answered during visit. Reviewed upcoming appts. Contact info given and instructed to call with any further questions or needs. Pt verbalized understanding. Nothing further needed at this time.       Time Spent with Patient: 60 (04/28/20 1100)

## 2020-04-28 NOTE — Assessment & Plan Note (Addendum)
#   LLL Lung nodule- ~ 58mm subpleural; increase in size in the last year or so.  PET scan-increased uptake; no other intrathoracic disease or distant metastatic disease.  Clinical stage I.  #Discussed the treatment options of early stage lung cancer would be surgery.  Also discussed options that if patient is not a surgical candidate/or tumor cannot be resected.  The options include -percutaneous CT-guided biopsy to confirm malignancy; and histology.  If patient is not a candidate for CT-guided biopsy at this time [see below]-SBRT as per radiation oncology would be the next best option of treatment.  As per radiation oncology-if biopsy cannot be done-plan is to repeat a scan in 3 months-and if progressive would recommend SBRT.   #History of traumatic pneumothorax-needing mechanical ventilation prolonged hospital stay.  Patient reluctant with CT-guided biopsy; given previous complication; which I think is very reasonable/concerning.   #For now proceed with evaluation with thoracic surgery as planned this week.  If not a surgical candidate-we will refer to radiation oncology.   Thank you Drs. Klein/Fleming for allowing me to participate in the care of your pleasant patient. Please do not hesitate to contact me with questions or concerns in the interim.  Discussed with Trinda Pascal.  Discussed the tumor conference.  # DISPOSITION: # follow up TBD- Dr.B  Cc; Drs.Crist Fat  # I reviewed the blood work- with the patient in detail; also reviewed the imaging independently [as summarized above]; and with the patient in detail.

## 2020-04-30 ENCOUNTER — Encounter: Payer: Self-pay | Admitting: Urology

## 2020-05-02 ENCOUNTER — Telehealth: Payer: Self-pay

## 2020-05-02 ENCOUNTER — Other Ambulatory Visit: Payer: Self-pay

## 2020-05-02 ENCOUNTER — Ambulatory Visit (INDEPENDENT_AMBULATORY_CARE_PROVIDER_SITE_OTHER): Payer: Medicare HMO | Admitting: Cardiothoracic Surgery

## 2020-05-02 ENCOUNTER — Encounter: Payer: Self-pay | Admitting: Cardiothoracic Surgery

## 2020-05-02 VITALS — BP 146/88 | HR 67 | Temp 98.5°F | Resp 12 | Ht 67.0 in | Wt 184.0 lb

## 2020-05-02 DIAGNOSIS — R911 Solitary pulmonary nodule: Secondary | ICD-10-CM

## 2020-05-02 NOTE — Progress Notes (Signed)
Patient ID: Derrick Sheppard, male   DOB: 1956-08-25, 64 y.o.   MRN: [id number]  Chief Complaint  Patient presents with  . New Patient (Initial Visit)    LLL nodule    Referred By Dr. Kerrin Mo Reason for Referral left lower lobe mass  HPI Location, Quality, Duration, Severity, Timing, Context, Modifying Factors, Associated Signs and Symptoms.  [name] is a 64 y.o. male.  He has a longstanding smoking history but quit 3 years ago when he suffered a fall from a ladder resulting in a protracted stay at [facility].  He quit smoking at that time because of his significant pain.  He had multiple right-sided rib fractures and a right-sided pneumothorax and required intubation as well.  After that point he quit and states that his breathing has never been good since then.  Prior to that time though he did have a significant smoking history.  He has been followed with serial CT scans as part of the lung screening program.  This has revealed an enlarging vague left lower lobe nodule which was PET positive.  There were no other significant metastatic lesions identified and he was sent here for consideration of surgical resection of his presumed left lower lobe malignancy.  Patient has not had a biopsy at this time.  He is on Eliquis for presumed atrial fibrillation.  The details of that are unclear at this point.  He states that he does get quite short of breath.  He is not able to lift any heavy objects.  He is able to walk up a flight of stairs although quite slowly.  He does see Dr. Raul Del pulmonary for his ongoing management.  He did have some pulmonary function studies done which were quite poor.  His DLCO was 48% of predicted and his FEV1 was 60% of predicted.  This did not change with bronchodilator therapy.  He is not had any hemoptysis.  He has not had any fevers or chills.  He does not have a productive cough.   Past Medical History:  Diagnosis Date  . Allergic state   .  Arthritis   . Asthma   . B12 deficiency   . Complication of anesthesia    Difficult to wake up with anesthesia from colonoscopy  . COPD (chronic obstructive pulmonary disease) ([place])   . Coronary artery disease   . Dysrhythmia   . Edema   . GERD (gastroesophageal reflux disease)   . Headache   . History of kidney stones   . Hypertension   . Palpitations   . Pulmonary nodules   . Sleep apnea     Past Surgical History:  Procedure Laterality Date  . CHOLECYSTECTOMY    . COLONOSCOPY    . COLONOSCOPY WITH PROPOFOL N/A 04/09/2016   Procedure: COLONOSCOPY WITH PROPOFOL;  Surgeon: Lollie Sails, MD;  Location: [facility] ENDOSCOPY;  Service: Endoscopy;  Laterality: N/A;  . TEE WITHOUT CARDIOVERSION N/A 02/21/2019   Procedure: TRANSESOPHAGEAL ECHOCARDIOGRAM (TEE);  Surgeon: Corey Skains, MD;  Location: [facility] ORS;  Service: Cardiovascular;  Laterality: N/A;    Family History  Problem Relation Age of Onset  . Colon cancer Father 104       died of cancer  . Stomach cancer Father   . Cancer - Colon Father   . Hypertension Mother   . Prostate cancer Neg Hx   . Bladder Cancer Neg Hx   . Kidney cancer Neg Hx     Social History Social  History   Tobacco Use  . Smoking status: Former Smoker    Packs/day: 1.50    Years: 46.00    Pack years: 69.00    Types: Cigarettes    Quit date: 09/01/2017    Years since quitting: 2.6  . Smokeless tobacco: Never Used  Vaping Use  . Vaping Use: Never used  Substance Use Topics  . Alcohol use: No  . Drug use: No    Allergies  Allergen Reactions  . Amlodipine Swelling    Makes his ankles and legs swell.  . Codeine Other (See Comments)    Gives him a Headache.    Current Outpatient Medications  Medication Sig Dispense Refill  . albuterol (PROVENTIL HFA;VENTOLIN HFA) 108 (90 Base) MCG/ACT inhaler Inhale 2 puffs into the lungs every 6 (six) hours as needed for wheezing or shortness of breath.    . Cholecalciferol (VITAMIN D3) 50 MCG (2000  UT) capsule Take 2,000 Units by mouth daily.     Marland Kitchen ELIQUIS 5 MG TABS tablet Take 5 mg by mouth 2 (two) times daily.    . fluticasone (FLONASE) 50 MCG/ACT nasal spray Place 1 spray into both nostrils daily as needed (hoarse voice).     . mirtazapine (REMERON) 7.5 MG tablet Take 7.5 mg by mouth at bedtime as needed (anxiousness/rest).     . pantoprazole (PROTONIX) 40 MG tablet Take 40 mg by mouth 2 (two) times a day.    . potassium chloride (KLOR-CON) 10 MEQ tablet Take 10 mEq by mouth daily.    Marland Kitchen telmisartan (MICARDIS) 80 MG tablet Take 80 mg by mouth daily.    . TRELEGY ELLIPTA 100-62.5-25 MCG/INH AEPB Inhale 1 puff into the lungs daily.    . vitamin B-12 (CYANOCOBALAMIN) 1000 MCG tablet Take 1,000 mcg by mouth daily.    Marland Kitchen atorvastatin (LIPITOR) 40 MG tablet Take 1 tablet (40 mg total) by mouth daily at 6 PM. 30 tablet 1   No current facility-administered medications for this visit.      Review of Systems A complete review of systems was asked and was negative except for the following positive findings hearing loss, tinnitus, shortness of breath.  Blood pressure (!) 146/88, pulse 67, temperature 98.5 F (36.9 C), temperature source Oral, resp. rate 12, height 5\' 7"  (1.702 m), weight 184 lb (83.5 kg), SpO2 97 %.  Physical Exam CONSTITUTIONAL:  Pleasant, well-developed, well-nourished, and in no acute distress. EYES: Pupils equal and reactive to light, Sclera non-icteric EARS, NOSE, MOUTH AND THROAT:  The oropharynx was clear.  Dentition is very poor repair.  Oral mucosa pink and moist. LYMPH NODES:  Lymph nodes in the neck and axillae were normal RESPIRATORY:  Lungs were clear.  Normal respiratory effort without pathologic use of accessory muscles of respiration CARDIOVASCULAR: Heart was regular without murmurs.  There were no carotid bruits. GI: The abdomen was soft, nontender, and nondistended. There were no palpable masses. There was no hepatosplenomegaly. There were normal bowel  sounds in all quadrants. GU:  Rectal deferred.   MUSCULOSKELETAL:  Normal muscle strength and tone.  No clubbing or cyanosis.   SKIN:  There were no pathologic skin lesions.  There were no nodules on palpation. NEUROLOGIC:  Sensation is normal.  Cranial nerves are grossly intact. PSYCH:  Oriented to person, place and time.  Mood and affect are normal.  Data Reviewed CT scans and PET scans  I have personally reviewed the patient's imaging, laboratory findings and medical records.    Assessment  Enlarging PET positive left lower lobe nodule    Plan    I had a long discussion today with him and his wife.  The patient does have an enlarging left lower lobe nodule.  It is relatively poorly marginated on the scan.  I reviewed with him in detail the indications and risks of the various treatment options.  Regarding surgery his pulmonary function studies are rather poor and lobectomy may carry significant risks.  Sublobar resection may be possible.  I reviewed with him the data suggesting that sublobar resection was a suboptimal compared to lobectomy.  He understands the risks associated with these 2 procedures including risks of bleeding, infection, air leak and death.  I also reviewed with him the option of performing a CT-guided needle biopsy prior to any sort of surgical intervention.  We also discussed the role that radiation therapy may play in the management of a presumed stage I carcinoma of the lung.  Because he is on Eliquis this will need to be stopped prior to any intervention.  He also has a commitment for mid August and is unable to break that at this time.  Therefore he is requested that we follow-up with him towards the end of August.  We will go ahead and set him up for CT-guided needle biopsy and he will need to have his Eliquis stopped beforehand.  I will work with his referring physician who ordered the Eliquis to determine the exact reasons for it and determine if any prior bridging  of his anticoagulation is required.  All of his questions were answered.  He will come back to see [place] again at the end of August.       Nestor Lewandowsky, MD 05/02/2020, 9:09 AM

## 2020-05-02 NOTE — Telephone Encounter (Signed)
Anticoagulant clearance faxed to [facility] at this time- Eliquis.

## 2020-05-02 NOTE — Telephone Encounter (Signed)
Message left for Derrick Sheppard to return call to office to scheduled Ct guided Bx.

## 2020-05-02 NOTE — Telephone Encounter (Signed)
Spoke with Marcie Bal sending CT guided Bx request to [place]. Will notify of appointment.

## 2020-05-02 NOTE — Patient Instructions (Addendum)
We will call you later today or Monday to let you know when your CT lung guided biopsy will be scheduled.  Anti-coagulant faxed to [facility] for Eliquis.  We will let you know when to stop the Eliquis. Do not stop the medication until we have spoke with you.   Please see your return appointment below.

## 2020-05-05 NOTE — Telephone Encounter (Signed)
Spoke with Pamala Hurry regarding Ct guided biopsy- patient requesting to have this done after 05/24/20 and specialty scheduling does not have the schedule for August as of yet-  Pamala Hurry will call once she has the schedule-  Patient scheduled to see [facility] 05/19/20 for Cardiac clearance for Eliquis.

## 2020-05-19 ENCOUNTER — Telehealth: Payer: Self-pay

## 2020-05-19 DIAGNOSIS — R0602 Shortness of breath: Secondary | ICD-10-CM | POA: Insufficient documentation

## 2020-05-19 NOTE — Telephone Encounter (Signed)
CT Biopsy scheduled 06/03/20 @ 10:30.  Dr.Oaks 06/13/20 @ 8am to discuss CT results. Left message for patient to confirm appointment.

## 2020-05-19 NOTE — Telephone Encounter (Signed)
Left message for Pamala Hurry to call back regarding CT guided biopsy appointment-needs scheduling after 05/24/20.

## 2020-05-19 NOTE — Telephone Encounter (Signed)
Patient called back and is aware of appointment dates and time

## 2020-05-20 NOTE — Progress Notes (Addendum)
Cardiac clearance has been received from Dr Alveria Apley office. The patient is cleared at Low risk.   This has been faxed to [facility] in [employer].

## 2020-05-21 ENCOUNTER — Telehealth: Payer: Self-pay

## 2020-05-21 NOTE — Telephone Encounter (Signed)
Spoke with patient- Covid test 06/02/20  [facility] before 10 am.  Patient verbalized understanding.

## 2020-05-27 ENCOUNTER — Ambulatory Visit: Payer: Medicare HMO

## 2020-05-30 ENCOUNTER — Other Ambulatory Visit: Payer: Medicare HMO

## 2020-06-02 ENCOUNTER — Other Ambulatory Visit: Payer: Self-pay | Admitting: Radiology

## 2020-06-02 ENCOUNTER — Other Ambulatory Visit: Payer: Self-pay

## 2020-06-02 ENCOUNTER — Other Ambulatory Visit
Admission: RE | Admit: 2020-06-02 | Discharge: 2020-06-02 | Disposition: A | Discharge status: Home / Self Care | Payer: Medicare HMO | Source: Ambulatory Visit | Attending: Cardiothoracic Surgery | Admitting: Cardiothoracic Surgery

## 2020-06-02 DIAGNOSIS — U071 COVID-19: Secondary | ICD-10-CM | POA: Diagnosis not present

## 2020-06-02 DIAGNOSIS — Z01812 Encounter for preprocedural laboratory examination: Secondary | ICD-10-CM | POA: Diagnosis present

## 2020-06-02 LAB — SARS CORONAVIRUS 2 (TAT 6-24 HRS): SARS Coronavirus 2: POSITIVE — AB

## 2020-06-03 ENCOUNTER — Ambulatory Visit: Admission: RE | Admit: 2020-06-03 | Payer: Medicare HMO | Source: Ambulatory Visit

## 2020-06-04 ENCOUNTER — Telehealth: Payer: Self-pay

## 2020-06-04 NOTE — Telephone Encounter (Signed)
Positive Covid- cancelled CT Biopsy-Spoke with Marcie Bal to reschedule CT Biopsy-   CT Biopsy rescheduled 06/26/20 @ 9:30. Marcie Bal to let me know regarding another Covid test.   Dr.Oaks 07/07/20 @ 9:30 am. Patient notified and verbalized understanding.

## 2020-06-11 ENCOUNTER — Ambulatory Visit: Payer: Medicare HMO | Admitting: Cardiothoracic Surgery

## 2020-06-13 ENCOUNTER — Ambulatory Visit: Payer: Medicare HMO | Admitting: Cardiothoracic Surgery

## 2020-06-23 ENCOUNTER — Telehealth: Payer: Self-pay

## 2020-06-23 NOTE — Telephone Encounter (Signed)
Spoke with patient he will not take Eliquis beginning tomorrow. Patient was reminded to [facility] medical mall for Ct biopsy 06/26/20.

## 2020-06-24 ENCOUNTER — Other Ambulatory Visit: Payer: Self-pay | Admitting: Physician Assistant

## 2020-06-24 DIAGNOSIS — Z8616 Personal history of COVID-19: Secondary | ICD-10-CM | POA: Insufficient documentation

## 2020-06-25 ENCOUNTER — Other Ambulatory Visit: Payer: Self-pay | Admitting: Student

## 2020-06-26 ENCOUNTER — Ambulatory Visit
Admission: RE | Admit: 2020-06-26 | Discharge: 2020-06-26 | Disposition: A | Discharge status: Home / Self Care | Payer: Medicare HMO | Source: Ambulatory Visit | Attending: Cardiothoracic Surgery | Admitting: Cardiothoracic Surgery

## 2020-06-26 ENCOUNTER — Other Ambulatory Visit: Payer: Self-pay

## 2020-06-26 ENCOUNTER — Ambulatory Visit
Admission: RE | Admit: 2020-06-26 | Discharge: 2020-06-26 | Disposition: A | Discharge status: Home / Self Care | Payer: Medicare HMO | Source: Ambulatory Visit | Attending: Interventional Radiology | Admitting: Interventional Radiology

## 2020-06-26 DIAGNOSIS — I2510 Atherosclerotic heart disease of native coronary artery without angina pectoris: Secondary | ICD-10-CM | POA: Diagnosis not present

## 2020-06-26 DIAGNOSIS — R911 Solitary pulmonary nodule: Secondary | ICD-10-CM | POA: Diagnosis not present

## 2020-06-26 DIAGNOSIS — Z87891 Personal history of nicotine dependence: Secondary | ICD-10-CM | POA: Diagnosis not present

## 2020-06-26 DIAGNOSIS — Z7901 Long term (current) use of anticoagulants: Secondary | ICD-10-CM | POA: Insufficient documentation

## 2020-06-26 DIAGNOSIS — K219 Gastro-esophageal reflux disease without esophagitis: Secondary | ICD-10-CM | POA: Diagnosis not present

## 2020-06-26 DIAGNOSIS — Z7951 Long term (current) use of inhaled steroids: Secondary | ICD-10-CM | POA: Diagnosis not present

## 2020-06-26 DIAGNOSIS — J439 Emphysema, unspecified: Secondary | ICD-10-CM | POA: Insufficient documentation

## 2020-06-26 DIAGNOSIS — I4891 Unspecified atrial fibrillation: Secondary | ICD-10-CM | POA: Diagnosis not present

## 2020-06-26 DIAGNOSIS — Z79899 Other long term (current) drug therapy: Secondary | ICD-10-CM | POA: Insufficient documentation

## 2020-06-26 DIAGNOSIS — E538 Deficiency of other specified B group vitamins: Secondary | ICD-10-CM | POA: Diagnosis not present

## 2020-06-26 LAB — CBC
HCT: 41.5 % (ref 39.0–52.0)
Hemoglobin: 14.6 g/dL (ref 13.0–17.0)
MCH: 30 pg (ref 26.0–34.0)
MCHC: 35.2 g/dL (ref 30.0–36.0)
MCV: 85.4 fL (ref 80.0–100.0)
Platelets: 175 10*3/uL (ref 150–400)
RBC: 4.86 MIL/uL (ref 4.22–5.81)
RDW: 12.6 % (ref 11.5–15.5)
WBC: 7.6 10*3/uL (ref 4.0–10.5)
nRBC: 0 % (ref 0.0–0.2)

## 2020-06-26 LAB — PROTIME-INR
INR: 0.9 (ref 0.8–1.2)
Prothrombin Time: 12 seconds (ref 11.4–15.2)

## 2020-06-26 MED ORDER — MIDAZOLAM HCL 2 MG/2ML IJ SOLN
INTRAMUSCULAR | Status: AC | PRN
  Administered 2020-06-26: 1 mg via INTRAVENOUS

## 2020-06-26 MED ORDER — FENTANYL CITRATE (PF) 100 MCG/2ML IJ SOLN
INTRAMUSCULAR | Status: DC
Start: 2020-06-26 — End: 2020-06-26
  Filled 2020-06-26: qty 2

## 2020-06-26 MED ORDER — FENTANYL CITRATE (PF) 100 MCG/2ML IJ SOLN
INTRAMUSCULAR | Status: AC | PRN
  Administered 2020-06-26 (×2): 50 ug via INTRAVENOUS

## 2020-06-26 MED ORDER — SODIUM CHLORIDE 0.9 % IV SOLN
INTRAVENOUS | Status: DC
  Administered 2020-06-26: 1000 mL via INTRAVENOUS

## 2020-06-26 MED ORDER — MIDAZOLAM HCL 2 MG/2ML IJ SOLN
INTRAMUSCULAR | Status: AC
  Filled 2020-06-26: qty 2

## 2020-06-26 NOTE — Discharge Instructions (Signed)
Lung Biopsy, Care After °This sheet gives you information about how to care for yourself after your procedure. Your health care provider may also give you more specific instructions depending on the type of biopsy you had. If you have problems or questions, contact your health care provider. °What can I expect after the procedure? °After the procedure, it is common to have: °· A cough. °· A sore throat. °· Pain where a needle, bronchoscope, or incision was used to collect a biopsy sample (biopsy site). °· You may cough up a small amount of bloody sputum for up to several days after your biopsy.  If you cough up more than a TBSP of blood come to the emergency room by EMS.  ° °Follow these instructions at home: °Medicines °· Take over-the-counter and prescription medicines only as told by your health care provider. °· Do not drive for 24 hours if you were given a sedative. °· Do not drink alcohol while taking pain medicine. °· Do not drive or use heavy machinery while taking prescription pain medicine. °· To prevent or treat constipation while you are taking prescription pain medicine, your health care provider may recommend that you: °? Drink enough fluid to keep your urine clear or pale yellow. °? Take over-the-counter or prescription medicines. °? Eat foods that are high in fiber, such as fresh fruits and vegetables, whole grains, and beans. °? Limit foods that are high in fat and processed sugars, such as fried and sweet foods. °Activity °· If you had an incision during your procedure, avoid activities that may pull the incision site open. °· Return to your normal activities tomorrow.  °· You may shower tomorrow leave band aid on site and pat area dry. Change your band aid after your shower.  You may remove band aid the following day.   Do not scrub or rub your biopsy site or area surrounding it.  °If you had an open biopsy:  °· Follow instructions from your health care provider about how to take care of your  incision. Make sure you: °? Wash your hands with soap and water before you change your bandage (dressing). If soap and water are not available, use hand sanitizer. °? Remove your dressing in 24 hours °? Leave stitches (sutures), skin glue, or adhesive strips in place. These skin closures may need to stay in place for 2 weeks or longer. If adhesive strip edges start to loosen and curl up, you may trim the loose edges. Do not remove adhesive strips completely unless your health care provider tells you to do that. °· Check your incision area every day for signs of infection. Check for: °? Redness, swelling, or pain. °? Fluid or blood. °? Warmth. °? Pus or a bad smell. °General instructions °· It is up to you to get the results of your procedure. Ask your health care provider, or the department that is doing the procedure, when your results will be ready. °Contact a health care provider if: °· You have a fever. °· You have redness, swelling, or pain around your biopsy site. °· You have fluid or blood coming from your biopsy site. °· Your biopsy site feels warm to the touch. °· You have pus or a bad smell coming from your biopsy site. °Get help right away if: °· You cough up blood. °· You have trouble breathing. °· You have chest pain. °Summary °· After the procedure, it is common to have a sore throat and a cough. °· Return to   your normal activities as told by your health care provider. Ask your health care provider what activities are safe for you. °· Take over-the-counter and prescription medicines only as told by your health care provider. °· Report any unusual symptoms to your health care provider. °This information is not intended to replace advice given to you by your health care provider. Make sure you discuss any questions you have with your health care provider. °Document Released: 10/26/2016 Document Revised: 10/26/2016 Document Reviewed: 10/26/2016 °Elsevier Interactive Patient Education © 2018 Elsevier  Inc. °

## 2020-06-26 NOTE — Progress Notes (Signed)
Patient clinically stable post CT Lung biopsy, tolerated well. Vitals stable pre and post procedure. Denies complaints at this time. bandade dry and intact to left lower lateral site. Awake/alert and oriented post procedure. Received Versed 1mg  along with Fentanyl 100 mcg IV for procedure. Report given to Lowanda Foster Rn in specials post procedure with questions answered.

## 2020-06-26 NOTE — Procedures (Signed)
Interventional Radiology Procedure Note  Procedure: CT guided biopsy of left lower lobe posterior lung nodule  Complications: None immediate  Recommendations: - Bedrest until CXR cleared.  Minimize talking, coughing or otherwise straining.  - Follow up 1 hr CXR pending  - Difficult lung biopsy due to guarding of overlying rib, small size of nodule, and subpleural location.  If biopsy results demonstrate normal pulmonary parenchyma, consider surgical approach for tissue sampling.    Ruthann Cancer, MD

## 2020-06-26 NOTE — Consult Note (Signed)
Chief Complaint: Patient was seen in consultation today for CT guided lung biopsy at the request of Oaks,Timothy  Referring Physician(s): Oaks,Timothy  Patient Status: [facility] - Out-pt  History of Present Illness: Derrick Sheppard is a 64 y.o. male with history of smoking, COPD, atrial fibrillation, and enlarging left lung mass who presents today for CT guided biopsy.  Feels well today without complaints.  Has held eliquis since 06/21/20.    Past Medical History:  Diagnosis Date  . Allergic state   . Arthritis   . Asthma   . B12 deficiency   . Complication of anesthesia    Difficult to wake up with anesthesia from colonoscopy  . COPD (chronic obstructive pulmonary disease) ([place])   . Coronary artery disease   . Dysrhythmia   . Edema   . GERD (gastroesophageal reflux disease)   . Headache   . History of kidney stones   . Hypertension   . Palpitations   . Pulmonary nodules   . Sleep apnea     Past Surgical History:  Procedure Laterality Date  . CHOLECYSTECTOMY    . COLONOSCOPY    . COLONOSCOPY WITH PROPOFOL N/A 04/09/2016   Procedure: COLONOSCOPY WITH PROPOFOL;  Surgeon: Lollie Sails, MD;  Location: [facility] ENDOSCOPY;  Service: Endoscopy;  Laterality: N/A;  . TEE WITHOUT CARDIOVERSION N/A 02/21/2019   Procedure: TRANSESOPHAGEAL ECHOCARDIOGRAM (TEE);  Surgeon: Corey Skains, MD;  Location: [facility] ORS;  Service: Cardiovascular;  Laterality: N/A;    Allergies: Amlodipine and Codeine  Medications: Prior to Admission medications   Medication Sig Start Date End Date Taking? Authorizing Provider  albuterol (PROVENTIL HFA;VENTOLIN HFA) 108 (90 Base) MCG/ACT inhaler Inhale 2 puffs into the lungs every 6 (six) hours as needed for wheezing or shortness of breath.   Yes [provider]  Cholecalciferol (VITAMIN D3) 50 MCG (2000 UT) capsule Take 2,000 Units by mouth daily.    Yes [provider]  mirtazapine (REMERON) 7.5 MG tablet Take 7.5 mg by mouth at  bedtime as needed (anxiousness/rest).    Yes [provider]  pantoprazole (PROTONIX) 40 MG tablet Take 40 mg by mouth 2 (two) times a day. 01/27/19  Yes [provider]  potassium chloride (KLOR-CON) 10 MEQ tablet Take 10 mEq by mouth daily. 05/28/19  Yes [provider]  telmisartan (MICARDIS) 80 MG tablet Take 80 mg by mouth daily. 12/21/18  Yes [provider]  TRELEGY ELLIPTA 100-62.5-25 MCG/INH AEPB Inhale 1 puff into the lungs daily. 12/01/19  Yes [provider]  vitamin B-12 (CYANOCOBALAMIN) 1000 MCG tablet Take 1,000 mcg by mouth daily.   Yes [provider]  atorvastatin (LIPITOR) 40 MG tablet Take 1 tablet (40 mg total) by mouth daily at 6 PM. 12/18/18 02/11/20  Sainani, Belia Heman, MD  ELIQUIS 5 MG TABS tablet Take 5 mg by mouth 2 (two) times daily. 01/28/20   [provider]  fluticasone (FLONASE) 50 MCG/ACT nasal spray Place 1 spray into both nostrils daily as needed (hoarse voice).  12/05/19   [provider]     Family History  Problem Relation Age of Onset  . Colon cancer Father 74       died of cancer  . Stomach cancer Father   . Cancer - Colon Father   . Hypertension Mother   . Prostate cancer Neg Hx   . Bladder Cancer Neg Hx   . Kidney cancer Neg Hx     Social History   Socioeconomic History  .  Marital status: Married    Spouse name: Not on file  . Number of children: Not on file  . Years of education: Not on file  . Highest education level: Not on file  Occupational History  . Not on file  Tobacco Use  . Smoking status: Former Smoker    Packs/day: 1.50    Years: 46.00    Pack years: 69.00    Types: Cigarettes    Quit date: 09/01/2017    Years since quitting: 2.8  . Smokeless tobacco: Never Used  Vaping Use  . Vaping Use: Never used  Substance and Sexual Activity  . Alcohol use: No  . Drug use: No  . Sexual activity: Not on file  Other Topics Concern  . 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 1 living daughter-; 5 mins away.    Social Determinants of Health   Financial Resource Strain:   . Difficulty of Paying Living Expenses: Not on file  Food Insecurity:   . Worried About Charity fundraiser in the Last Year: Not on file  . Ran Out of Food in the Last Year: Not on file  Transportation Needs:   . Lack of Transportation (Medical): Not on file  . Lack of Transportation (Non-Medical): Not on file  Physical Activity:   . Days of Exercise per Week: Not on file  . Minutes of Exercise per Session: Not on file  Stress:   . Feeling of Stress : Not on file  Social Connections:   . Frequency of Communication with Friends and Family: Not on file  . Frequency of Social Gatherings with Friends and Family: Not on file  . Attends Religious Services: Not on file  . Active Member of Clubs or Organizations: Not on file  . Attends Archivist Meetings: Not on file  . Marital Status: Not on file     Review of Systems: A 12 point ROS discussed and pertinent positives are indicated in the HPI above.  All other systems are negative.  Review of Systems  Constitutional: Negative for chills and fever.  HENT: Negative.   Eyes: Negative.   Respiratory: Negative for shortness of breath.   Cardiovascular: Negative for chest pain.  Gastrointestinal: Negative.   Endocrine: Negative.   Genitourinary: Negative.   Musculoskeletal: Negative.   Skin: Negative.   Allergic/Immunologic: Negative.   Neurological: Negative.   Hematological: Negative.   Psychiatric/Behavioral: Negative.     Vital Signs: BP 130/74   Pulse (!) 53   Temp 98.3 F (36.8 C) (Oral)   Resp 14   Ht 5\' 7"  (1.702 m)   Wt 83.5 kg   SpO2 97%   BMI 28.82 kg/m   Physical Exam Constitutional:      Appearance: He is normal weight.  HENT:     Head: Normocephalic.     Mouth/Throat:     Mouth: Mucous membranes are moist.      Comments: MP 2  Cardiovascular:     Rate and Rhythm: Normal rate and regular rhythm.  Pulmonary:     Breath sounds: Normal breath sounds.  Abdominal:     General: There is no distension.  Skin:    General: Skin is warm and dry.  Neurological:     Mental Status: He is alert and oriented to person, place, and time.     Imaging: CT Chest 04/09/20   PET CT 04/17/20  Labs:  CBC: Recent Labs    02/24/20 0600 02/25/20 0535 02/26/20 0436 06/26/20 0937  WBC 12.6* 11.8* 10.1 7.6  HGB 12.4* 11.8* 12.4* 14.6  HCT 35.7* 34.1* 34.2* 41.5  PLT 193 203 237 175    COAGS: Recent Labs    06/26/20 0937  INR 0.9    BMP: Recent Labs    10/06/19 1526 02/22/20 0350 02/23/20 0521  NA 140 140 136  K 3.0* 4.7 4.6  CL 103 109 103  CO2 27 23 27   GLUCOSE 93 139* 115*  BUN 11 21 19   CALCIUM 8.8* 8.6* 8.5*  CREATININE 1.01 0.98 0.97  GFRNONAA >60 >60 >60  GFRAA >60 >60 >60    LIVER FUNCTION TESTS: Recent Labs    10/06/19 1526 02/22/20 1436  BILITOT 2.5* 1.1  AST 62* 42*  ALT 89* 47*  ALKPHOS 427* 255*  PROT 7.6 7.2  ALBUMIN 3.7 3.7    TUMOR MARKERS: No results for input(s): AFPTM, CEA, CA199, CHROMGRNA in the last 8760 hours.  Assessment and Plan:  64 year old male with history of enlarging left lower lobe posterior subpleural mass concerning for primary lung cancer.  Plan to proceed with CT-guided lung biopsy.  Risks and benefits of CT guided lung nodule biopsy was discussed with the patient including, but not limited to bleeding, hemoptysis, respiratory failure requiring intubation, infection, pneumothorax requiring chest tube placement, stroke from air embolism or even death.  All of the patient's questions were answered and the patient is agreeable to proceed.  Consent signed and in chart.    Thank you for this interesting consult.  I greatly enjoyed meeting Bobbye Reinitz and look forward to participating in their care.  A copy of this report was sent  to the requesting provider on this date.  Electronically Signed: Suzette Battiest, MD 06/26/2020, 10:10 AM   I spent a total of  15 Minutes  in face to face in clinical consultation, greater than 50% of which was counseling/coordinating care for CT-guided lung biopsy.

## 2020-06-30 LAB — SURGICAL PATHOLOGY

## 2020-07-07 ENCOUNTER — Ambulatory Visit: Payer: Medicare HMO | Admitting: Cardiothoracic Surgery

## 2020-07-08 ENCOUNTER — Other Ambulatory Visit: Payer: Self-pay

## 2020-07-08 ENCOUNTER — Telehealth: Payer: Self-pay | Admitting: Emergency Medicine

## 2020-07-08 ENCOUNTER — Ambulatory Visit (INDEPENDENT_AMBULATORY_CARE_PROVIDER_SITE_OTHER): Payer: Medicare HMO | Admitting: Cardiothoracic Surgery

## 2020-07-08 ENCOUNTER — Encounter: Payer: Self-pay | Admitting: Cardiothoracic Surgery

## 2020-07-08 VITALS — BP 110/60 | HR 62 | Temp 98.4°F | Resp 14 | Ht 67.0 in | Wt 183.8 lb

## 2020-07-08 DIAGNOSIS — R911 Solitary pulmonary nodule: Secondary | ICD-10-CM

## 2020-07-08 NOTE — Telephone Encounter (Signed)
Spoke to [place] at Dr [facility] office about rescheduling patients appt in December to a sooner appt.   Appt rescheduled to tomorrow 07/09/20 at 1:30pm.   Called patient and spoke to spouse Olin Hauser). Made her aware of appt. Wife voiced understanding.

## 2020-07-08 NOTE — Progress Notes (Signed)
[facility] Follow Up Note  Patient ID: Derrick Sheppard, male   DOB: [birth year], 64 y.o.   MRN: [id number]  HISTORY: He returns today in follow-up.  He did undergo a CT-guided needle biopsy of his left lower lobe lesion.  The CT-guided needle biopsy did not return any evidence of malignancy but did return some multi nucleated giant cells and polarizable foreign matter.  He states that he tolerated the biopsy well.  He did develop Covid several weeks ago and had to wait for his biopsy.  He is now back on his Eliquis.    Vitals:   07/08/20 1003  BP: 110/60  Pulse: 62  Resp: 14  Temp: 98.4 F (36.9 C)  SpO2: 96%     EXAM:  Resp: Lungs are clear bilaterally.  No respiratory distress, normal effort. Heart:  Regular without murmurs Abd:  Abdomen is soft, non distended and non tender. No masses are palpable.  There is no rebound and no guarding.  Neurological: Alert and oriented to person, place, and time. Coordination normal.  Skin: Skin is warm and dry. No rash noted. No diaphoretic. No erythema. No pallor.  Psychiatric: Normal mood and affect. Normal behavior. Judgment and thought content normal.      ASSESSMENT: I have independently reviewed his CT scan.  The CT scan does show the appropriate location of the CT-guided needle biopsy.  I reviewed with him the results of the pathology which did not return any evidence of malignancy.   PLAN:   At the present time the patient feels that he is unwilling to undergo major surgical resection.  He states that he does get short of breath with activities.  He was congratulated on his smoking cessation now for over 5 years.  I would like to refer him back to Dr. Raul Del at the [facility] clinic for further evaluation.    Nestor Lewandowsky, MD

## 2020-07-08 NOTE — Patient Instructions (Signed)
We will contact Dr Gust Brooms office to move your appointment sooner than December. Their office should contact you to schedule your appointment.   Follow up as needed with Dr Genevive Bi. Call the office if you have any questions or concerns.

## 2020-07-09 ENCOUNTER — Encounter: Payer: Self-pay | Admitting: *Deleted

## 2020-07-09 NOTE — Progress Notes (Signed)
  Oncology Nurse Navigator Documentation  Navigator Location: [facility] (07/09/20 0900)   )Navigator Encounter Type: Appt/Treatment Plan Review (07/09/20 0900)                         Barriers/Navigation Needs: No Barriers At This Time;No Needs (07/09/20 0900)   Interventions: None Required (07/09/20 0900)           Results and notes reviewed. No evidence of malignancy at this time. Per Dr. Rogue Bussing, no further follow up needed at the [facility]. Pt will follow up with Dr. Raul Del for further surveillance per notes. Nothing further needed at this time.           Time Spent with Patient: 15 (07/09/20 0900)

## 2020-07-14 ENCOUNTER — Ambulatory Visit: Payer: Medicare HMO | Admitting: Cardiothoracic Surgery

## 2020-07-16 ENCOUNTER — Other Ambulatory Visit: Payer: Self-pay | Admitting: Family Medicine

## 2020-07-16 DIAGNOSIS — R972 Elevated prostate specific antigen [PSA]: Secondary | ICD-10-CM

## 2020-07-17 ENCOUNTER — Other Ambulatory Visit: Payer: Self-pay

## 2020-07-17 ENCOUNTER — Other Ambulatory Visit: Payer: Medicare HMO

## 2020-07-17 DIAGNOSIS — R972 Elevated prostate specific antigen [PSA]: Secondary | ICD-10-CM

## 2020-07-18 LAB — PSA: Prostate Specific Ag, Serum: 3.9 ng/mL (ref 0.0–4.0)

## 2020-07-24 ENCOUNTER — Ambulatory Visit: Payer: Medicare HMO | Admitting: Urology

## 2020-07-30 ENCOUNTER — Other Ambulatory Visit: Payer: Self-pay

## 2020-07-30 ENCOUNTER — Ambulatory Visit: Payer: Medicare HMO | Admitting: Urology

## 2020-07-30 ENCOUNTER — Encounter: Payer: Self-pay | Admitting: Urology

## 2020-07-30 VITALS — BP 118/74 | HR 80 | Ht 67.0 in | Wt 187.1 lb

## 2020-07-30 DIAGNOSIS — E538 Deficiency of other specified B group vitamins: Secondary | ICD-10-CM | POA: Insufficient documentation

## 2020-07-30 DIAGNOSIS — M1990 Unspecified osteoarthritis, unspecified site: Secondary | ICD-10-CM | POA: Insufficient documentation

## 2020-07-30 DIAGNOSIS — R972 Elevated prostate specific antigen [PSA]: Secondary | ICD-10-CM | POA: Diagnosis not present

## 2020-07-30 DIAGNOSIS — I2510 Atherosclerotic heart disease of native coronary artery without angina pectoris: Secondary | ICD-10-CM | POA: Insufficient documentation

## 2020-07-30 DIAGNOSIS — R609 Edema, unspecified: Secondary | ICD-10-CM | POA: Insufficient documentation

## 2020-07-30 DIAGNOSIS — Z125 Encounter for screening for malignant neoplasm of prostate: Secondary | ICD-10-CM | POA: Diagnosis not present

## 2020-07-30 DIAGNOSIS — K219 Gastro-esophageal reflux disease without esophagitis: Secondary | ICD-10-CM | POA: Insufficient documentation

## 2020-07-30 NOTE — Patient Instructions (Signed)
Prostate Cancer Screening  Prostate cancer screening is a test that is done to check for the presence of prostate cancer in men. The prostate gland is a walnut-sized gland that is located below the bladder and in front of the rectum in males. The function of the prostate is to add fluid to semen during ejaculation. Prostate cancer is the second most common type of cancer in men. Who should have prostate cancer screening?  Screening recommendations vary based on age and other risk factors. Screening is recommended if:  You are older than age 55. If you are age 55-69, talk with your health care provider about your need for screening and how often screening should be done. Because most prostate cancers are slow growing and will not cause death, screening is generally reserved in this age group for men who have a 10-15-year life expectancy.  You are younger than age 55, and you have these risk factors: ? Being a black male or a male of African descent. ? Having a father, brother, or uncle who has been diagnosed with prostate cancer. The risk is higher if your family member's cancer occurred at an early age. Screening is not recommended if:  You are younger than age 40.  You are between the ages of 40 and 54 and you have no risk factors.  You are 70 years of age or older. At this age, the risks that screening can cause are greater than the benefits that it may provide. If you are at high risk for prostate cancer, your health care provider may recommend that you have screenings more often or that you start screening at a younger age. How is screening for prostate cancer done? The recommended prostate cancer screening test is a blood test called the prostate-specific antigen (PSA) test. PSA is a protein that is made in the prostate. As you age, your prostate naturally produces more PSA. Abnormally high PSA levels may be caused by:  Prostate cancer.  An enlarged prostate that is not caused by cancer  (benign prostatic hyperplasia, BPH). This condition is very common in older men.  A prostate gland infection (prostatitis). Depending on the PSA results, you may need more tests, such as:  A physical exam to check the size of your prostate gland.  Blood and imaging tests.  A procedure to remove tissue samples from your prostate gland for testing (biopsy). What are the benefits of prostate cancer screening?  Screening can help to identify cancer at an early stage, before symptoms start and when the cancer can be treated more easily.  There is a small chance that screening may lower your risk of dying from prostate cancer. The chance is small because prostate cancer is a slow-growing cancer, and most men with prostate cancer die from a different cause. What are the risks of prostate cancer screening? The main risk of prostate cancer screening is diagnosing and treating prostate cancer that would never have caused any symptoms or problems. This is called overdiagnosisand overtreatment. PSA screening cannot tell you if your PSA is high due to cancer or a different cause. A prostate biopsy is the only procedure to diagnose prostate cancer. Even the results of a biopsy may not tell you if your cancer needs to be treated. Slow-growing prostate cancer may not need any treatment other than monitoring, so diagnosing and treating it may cause unnecessary stress or other side effects. A prostate biopsy may also cause:  Infection or fever.  A false negative. This is   a result that shows that you do not have prostate cancer when you actually do have prostate cancer. Questions to ask your health care provider  When should I start prostate cancer screening?  What is my risk for prostate cancer?  How often do I need screening?  What type of screening tests do I need?  How do I get my test results?  What do my results mean?  Do I need treatment? Where to find more information  The American Cancer  Society: www.cancer.org  American Urological Association: www.auanet.org Contact a health care provider if:  You have difficulty urinating.  You have pain when you urinate or ejaculate.  You have blood in your urine or semen.  You have pain in your back or in the area of your prostate. Summary  Prostate cancer is a common type of cancer in men. The prostate gland is located below the bladder and in front of the rectum. This gland adds fluid to semen during ejaculation.  Prostate cancer screening may identify cancer at an early stage, when the cancer can be treated more easily.  The prostate-specific antigen (PSA) test is the recommended screening test for prostate cancer.  Discuss the risks and benefits of prostate cancer screening with your health care provider. If you are age 66 or older, the risks that screening can cause are greater than the benefits that it may provide. This information is not intended to replace advice given to you by your health care provider. Make sure you discuss any questions you have with your health care provider. Document Revised: 05/10/2019 Document Reviewed: 05/10/2019 Elsevier Patient Education  2020 Elsevier Inc.  

## 2020-07-30 NOTE — Progress Notes (Signed)
   07/30/2020 2:25 PM   Derrick Sheppard Mar 07, 1956 [id number]  Reason for visit: Follow up PSA screening  HPI: I saw [name] back in urology clinic for PSA screening.  He was previously followed by Dr. Junious Silk.  His PSA has varied from 4 to 5.6 over the last few years.  His DRE was normal with Dr. Junious Silk in June 2019, and he has a 70 g prostate on a recent CT scan.  PSA density is reassuring 0.07.  He denies any urinary symptoms or gross hematuria.  There is no family history of prostate cancer.  He denies any gross hematuria or urinary symptoms.  PSA this year is stable at 3.9 from 4.2(26% free) last year.  We reviewed at length the risks and benefits of PSA screening, as well as AUA guidelines that do not recommend routine screening in men over age 44.  With his very low PSA density, elevated percentage free PSA, and stable PSA over the last few years, is borderline PSA value is likely secondary to an enlarged prostate.  He would like to continue yearly PSA screening.  RTC 1 year with PSA prior   Billey Co, MD  [place] [address] [place], [place] [address] [phone]

## 2020-08-26 DIAGNOSIS — R748 Abnormal levels of other serum enzymes: Secondary | ICD-10-CM | POA: Insufficient documentation

## 2020-11-10 ENCOUNTER — Other Ambulatory Visit: Payer: Self-pay | Admitting: Specialist

## 2020-11-10 DIAGNOSIS — R918 Other nonspecific abnormal finding of lung field: Secondary | ICD-10-CM

## 2020-11-13 LAB — COLOGUARD: COLOGUARD: POSITIVE — AB

## 2020-11-21 ENCOUNTER — Ambulatory Visit
Admission: RE | Admit: 2020-11-21 | Discharge: 2020-11-21 | Disposition: A | Discharge status: Home / Self Care | Payer: Medicare HMO | Source: Ambulatory Visit | Attending: Specialist | Admitting: Specialist

## 2020-11-21 ENCOUNTER — Other Ambulatory Visit: Payer: Self-pay

## 2020-11-21 DIAGNOSIS — R918 Other nonspecific abnormal finding of lung field: Secondary | ICD-10-CM | POA: Insufficient documentation

## 2021-01-01 ENCOUNTER — Other Ambulatory Visit: Payer: Self-pay

## 2021-01-01 ENCOUNTER — Other Ambulatory Visit
Admission: RE | Admit: 2021-01-01 | Discharge: 2021-01-01 | Disposition: A | Discharge status: Home / Self Care | Payer: Medicare HMO | Source: Ambulatory Visit | Attending: Internal Medicine | Admitting: Internal Medicine

## 2021-01-01 DIAGNOSIS — Z20822 Contact with and (suspected) exposure to covid-19: Secondary | ICD-10-CM | POA: Diagnosis not present

## 2021-01-01 DIAGNOSIS — Z01812 Encounter for preprocedural laboratory examination: Secondary | ICD-10-CM | POA: Insufficient documentation

## 2021-01-01 LAB — SARS CORONAVIRUS 2 (TAT 6-24 HRS): SARS Coronavirus 2: NEGATIVE

## 2021-01-02 ENCOUNTER — Encounter: Payer: Self-pay | Admitting: Internal Medicine

## 2021-01-05 ENCOUNTER — Encounter
Admission: RE | Disposition: A | Discharge status: Home / Self Care | Payer: Self-pay | Source: Home / Self Care | Attending: Internal Medicine

## 2021-01-05 ENCOUNTER — Ambulatory Visit
Admission: RE | Admit: 2021-01-05 | Discharge: 2021-01-05 | Disposition: A | Discharge status: Home / Self Care | Payer: Medicare HMO | Attending: Internal Medicine | Admitting: Internal Medicine

## 2021-01-05 ENCOUNTER — Ambulatory Visit: Payer: Medicare HMO | Admitting: Anesthesiology

## 2021-01-05 ENCOUNTER — Other Ambulatory Visit: Payer: Self-pay

## 2021-01-05 ENCOUNTER — Encounter: Payer: Self-pay | Admitting: Internal Medicine

## 2021-01-05 DIAGNOSIS — Z888 Allergy status to other drugs, medicaments and biological substances status: Secondary | ICD-10-CM | POA: Diagnosis not present

## 2021-01-05 DIAGNOSIS — R6881 Early satiety: Secondary | ICD-10-CM | POA: Insufficient documentation

## 2021-01-05 DIAGNOSIS — K640 First degree hemorrhoids: Secondary | ICD-10-CM | POA: Insufficient documentation

## 2021-01-05 DIAGNOSIS — K5730 Diverticulosis of large intestine without perforation or abscess without bleeding: Secondary | ICD-10-CM | POA: Diagnosis not present

## 2021-01-05 DIAGNOSIS — D123 Benign neoplasm of transverse colon: Secondary | ICD-10-CM | POA: Insufficient documentation

## 2021-01-05 DIAGNOSIS — K31A11 Gastric intestinal metaplasia without dysplasia, involving the antrum: Secondary | ICD-10-CM | POA: Diagnosis not present

## 2021-01-05 DIAGNOSIS — D122 Benign neoplasm of ascending colon: Secondary | ICD-10-CM | POA: Diagnosis not present

## 2021-01-05 DIAGNOSIS — Z7901 Long term (current) use of anticoagulants: Secondary | ICD-10-CM | POA: Diagnosis not present

## 2021-01-05 DIAGNOSIS — D120 Benign neoplasm of cecum: Secondary | ICD-10-CM | POA: Insufficient documentation

## 2021-01-05 DIAGNOSIS — Z885 Allergy status to narcotic agent status: Secondary | ICD-10-CM | POA: Insufficient documentation

## 2021-01-05 DIAGNOSIS — Z7951 Long term (current) use of inhaled steroids: Secondary | ICD-10-CM | POA: Insufficient documentation

## 2021-01-05 DIAGNOSIS — K2950 Unspecified chronic gastritis without bleeding: Secondary | ICD-10-CM | POA: Insufficient documentation

## 2021-01-05 DIAGNOSIS — Z79899 Other long term (current) drug therapy: Secondary | ICD-10-CM | POA: Diagnosis not present

## 2021-01-05 DIAGNOSIS — D124 Benign neoplasm of descending colon: Secondary | ICD-10-CM | POA: Insufficient documentation

## 2021-01-05 DIAGNOSIS — R195 Other fecal abnormalities: Secondary | ICD-10-CM | POA: Insufficient documentation

## 2021-01-05 DIAGNOSIS — Z87442 Personal history of urinary calculi: Secondary | ICD-10-CM | POA: Diagnosis not present

## 2021-01-05 DIAGNOSIS — K449 Diaphragmatic hernia without obstruction or gangrene: Secondary | ICD-10-CM | POA: Insufficient documentation

## 2021-01-05 DIAGNOSIS — R1013 Epigastric pain: Secondary | ICD-10-CM | POA: Insufficient documentation

## 2021-01-05 HISTORY — PX: COLONOSCOPY WITH PROPOFOL: SHX5780

## 2021-01-05 HISTORY — PX: ESOPHAGOGASTRODUODENOSCOPY (EGD) WITH PROPOFOL: SHX5813

## 2021-01-05 SURGERY — COLONOSCOPY WITH PROPOFOL
Anesthesia: General

## 2021-01-05 MED ORDER — PROPOFOL 10 MG/ML IV BOLUS
INTRAVENOUS | Status: AC
  Filled 2021-01-05: qty 20

## 2021-01-05 MED ORDER — DEXMEDETOMIDINE (PRECEDEX) IN NS 20 MCG/5ML (4 MCG/ML) IV SYRINGE
PREFILLED_SYRINGE | INTRAVENOUS | Status: AC
  Filled 2021-01-05: qty 5

## 2021-01-05 MED ORDER — PROPOFOL 500 MG/50ML IV EMUL
INTRAVENOUS | Status: DC | PRN
  Administered 2021-01-05: 120 ug/kg/min via INTRAVENOUS

## 2021-01-05 MED ORDER — SODIUM CHLORIDE 0.9 % IV SOLN
INTRAVENOUS | Status: DC
  Administered 2021-01-05: 1000 mL via INTRAVENOUS

## 2021-01-05 MED ORDER — PROPOFOL 500 MG/50ML IV EMUL
INTRAVENOUS | Status: AC
  Filled 2021-01-05: qty 50

## 2021-01-05 MED ORDER — DEXMEDETOMIDINE (PRECEDEX) IN NS 20 MCG/5ML (4 MCG/ML) IV SYRINGE
PREFILLED_SYRINGE | INTRAVENOUS | Status: DC | PRN
  Administered 2021-01-05 (×3): 4 ug via INTRAVENOUS

## 2021-01-05 MED ORDER — PROPOFOL 10 MG/ML IV BOLUS
INTRAVENOUS | Status: DC | PRN
  Administered 2021-01-05: 20 mg via INTRAVENOUS
  Administered 2021-01-05: 80 mg via INTRAVENOUS

## 2021-01-05 NOTE — Op Note (Signed)
[facility] Gastroenterology Patient Name: Derrick Sheppard Procedure Date: 01/05/2021 1:22 PM MRN: [id number] Account #: [id number] Date of Birth: [birth year] Admit Type: Outpatient Age: 65 Room: [facility] ENDO ROOM 2 Gender: Male Note Status: Finalized Procedure:             Upper GI endoscopy Indications:           Epigastric abdominal pain, Early satiety Providers:             Benay Pike. Alice Reichert MD, MD Referring MD:          Ramonita Lab, MD (Referring MD) Medicines:             Propofol per Anesthesia Complications:         No immediate complications. Estimated blood loss: None. Procedure:             Pre-Anesthesia Assessment:                        - The risks and benefits of the procedure and the                         sedation options and risks were discussed with the                         patient. All questions were answered and informed                         consent was obtained.                        - Patient identification and proposed procedure were                         verified prior to the procedure by the nurse. The                         procedure was verified in the procedure room.                        - ASA Grade Assessment: III - A patient with severe                         systemic disease.                        - After reviewing the risks and benefits, the patient                         was deemed in satisfactory condition to undergo the                         procedure.                        After obtaining informed consent, the endoscope was                         passed under direct vision. Throughout the procedure,  the patient's blood pressure, pulse, and oxygen                         saturations were monitored continuously. The Endoscope                         was introduced through the mouth, and advanced to the                         third part of duodenum. The upper GI endoscopy was                          accomplished without difficulty. The patient tolerated                         the procedure well. Findings:      The esophagus was normal.      Patchy moderate inflammation characterized by congestion (edema),       erosions and erythema was found in the gastric antrum. Biopsies were       taken with a cold forceps for Helicobacter pylori testing.      The examined duodenum was normal.      A 1 cm hiatal hernia was present.      The exam was otherwise without abnormality. Impression:            - Normal esophagus.                        - Gastritis. Biopsied.                        - Normal examined duodenum. Recommendation:        - Await pathology results.                        - Proceed with colonoscopy Procedure Code(s):     --- Professional ---                        [phone], Esophagogastroduodenoscopy, flexible,                         transoral; with biopsy, single or multiple Diagnosis Code(s):     --- Professional ---                        R68.81, Early satiety                        R10.13, Epigastric pain                        K29.70, Gastritis, unspecified, without bleeding CPT copyright 2019 American Medical Association. All rights reserved. The codes documented in this report are preliminary and upon coder review may  be revised to meet current compliance requirements. Efrain Sella MD, MD 01/05/2021 1:43:30 PM This report has been signed electronically. Number of Addenda: 0 Note Initiated On: 01/05/2021 1:22 PM Estimated Blood Loss:  Estimated blood loss: none. Estimated blood loss: none.      [facility]

## 2021-01-05 NOTE — Anesthesia Preprocedure Evaluation (Signed)
Anesthesia Evaluation  Patient identified by MRN, date of birth, ID band Patient awake    Reviewed: Allergy & Precautions, H&P , NPO status , Patient's Chart, lab work & pertinent test results, reviewed documented beta blocker date and time   History of Anesthesia Complications (+) history of anesthetic complications  Airway Mallampati: II   Neck ROM: full    Dental  (+) Poor Dentition, Teeth Intact   Pulmonary asthma , sleep apnea , pneumonia, resolved, COPD, former smoker,    Pulmonary exam normal        Cardiovascular Exercise Tolerance: Good hypertension, On Medications + CAD and + Peripheral Vascular Disease  + dysrhythmias Atrial Fibrillation  Rhythm:regular Rate:Normal     Neuro/Psych  Headaches, CVA, No Residual Symptoms negative psych ROS   GI/Hepatic Neg liver ROS, GERD  Medicated,  Endo/Other  negative endocrine ROS  Renal/GU negative Renal ROS  negative genitourinary   Musculoskeletal   Abdominal   Peds  Hematology negative hematology ROS (+)   Anesthesia Other Findings Past Medical History: No date: Allergic state No date: Arthritis No date: Asthma No date: B12 deficiency No date: Complication of anesthesia     Comment:  Difficult to wake up with anesthesia from colonoscopy No date: COPD (chronic obstructive pulmonary disease) (HCC) No date: Coronary artery disease No date: Dysrhythmia No date: Edema No date: GERD (gastroesophageal reflux disease) No date: Headache No date: History of kidney stones No date: Hypertension No date: Palpitations No date: Pulmonary nodules No date: Sleep apnea Past Surgical History: No date: CHOLECYSTECTOMY No date: COLONOSCOPY 04/09/2016: COLONOSCOPY WITH PROPOFOL; N/A     Comment:  Procedure: COLONOSCOPY WITH PROPOFOL;  Surgeon: Lollie Sails, MD;  Location: [facility] ENDOSCOPY;  Service:               Endoscopy;  Laterality: N/A; 02/21/2019:  TEE WITHOUT CARDIOVERSION; N/A     Comment:  Procedure: TRANSESOPHAGEAL ECHOCARDIOGRAM (TEE);                Surgeon: Corey Skains, MD;  Location: [facility] ORS;                Service: Cardiovascular;  Laterality: N/A;   Reproductive/Obstetrics negative OB ROS                             Anesthesia Physical Anesthesia Plan  ASA: III  Anesthesia Plan: General   Post-op Pain Management:    Induction:   PONV Risk Score and Plan:   Airway Management Planned:   Additional Equipment:   Intra-op Plan:   Post-operative Plan:   Informed Consent: I have reviewed the patients History and Physical, chart, labs and discussed the procedure including the risks, benefits and alternatives for the proposed anesthesia with the patient or authorized representative who has indicated his/her understanding and acceptance.     Dental Advisory Given  Plan Discussed with: CRNA  Anesthesia Plan Comments:         Anesthesia Quick Evaluation

## 2021-01-05 NOTE — Transfer of Care (Signed)
Immediate Anesthesia Transfer of Care Note  Patient: Derrick Sheppard  Procedure(s) Performed: COLONOSCOPY WITH PROPOFOL (N/A ) ESOPHAGOGASTRODUODENOSCOPY (EGD) WITH PROPOFOL (N/A )  Patient Location: PACU  Anesthesia Type:General  Level of Consciousness: drowsy  Airway & Oxygen Therapy: Patient Spontanous Breathing and Patient connected to nasal cannula oxygen  Post-op Assessment: Report given to RN and Post -op Vital signs reviewed and stable  Post vital signs: Reviewed and stable  Last Vitals:  Vitals Value Taken Time  BP 115/69 01/05/21 1457  Temp    Pulse 72 01/05/21 1457  Resp 14 01/05/21 1457  SpO2 93 % 01/05/21 1457  Vitals shown include unvalidated device data.  Last Pain:  Vitals:   01/05/21 1457  TempSrc:   PainSc: Asleep      Patients Stated Pain Goal: 0 (93/57/01 [id number])  Complications: No complications documented.

## 2021-01-05 NOTE — H&P (Signed)
Outpatient short stay form Pre-procedure 01/05/2021 1:19 PM Taheera Thomann K. Alice Reichert, M.D.  Primary Physician: Ramonita Lab III, M.D.  Reason for visit:  Epigastric pain, early satiety, Positive cologuard stool test  History of present illness: Patient is a pleasant64y/o male presenting on referral from primary care provider for a POSITIVE Cologuard result. Patient denies change in bowel habits, rectal bleeding, weight loss or abdominal pain.    Patient has early satiety with symptoms of epigastric fullness, discomfort. GERD symptoms are controlled on daily Protonix.   Current Facility-Administered Medications:  .  0.9 %  sodium chloride infusion, , Intravenous, Continuous, [place], Benay Pike, MD, Last Rate: 20 mL/hr at 01/05/21 1310, 1,000 mL at 01/05/21 1310  Medications Prior to Admission  Medication Sig Dispense Refill Last Dose  . ADVAIR DISKUS 250-50 MCG/DOSE AEPB SMARTSIG:1 Inhalation Via Inhaler Every 12 Hours   01/05/2021 at 0500  . albuterol (PROVENTIL HFA;VENTOLIN HFA) 108 (90 Base) MCG/ACT inhaler Inhale 2 puffs into the lungs every 6 (six) hours as needed for wheezing or shortness of breath.   01/04/2021 at Unknown time  . Cholecalciferol (VITAMIN D3) 50 MCG (2000 UT) capsule Take 2,000 Units by mouth daily.    01/04/2021 at Unknown time  . ELIQUIS 5 MG TABS tablet Take 5 mg by mouth 2 (two) times daily.   01/02/2021 at Unknown time  . INCRUSE ELLIPTA 62.5 MCG/INH AEPB Inhale 1 puff into the lungs daily.   01/05/2021 at Unknown time  . mirtazapine (REMERON) 7.5 MG tablet Take 7.5 mg by mouth at bedtime as needed (anxiousness/rest).    Past Week at Unknown time  . pantoprazole (PROTONIX) 40 MG tablet Take 40 mg by mouth 2 (two) times a day.   01/04/2021 at Unknown time  . potassium chloride (KLOR-CON) 10 MEQ tablet Take 10 mEq by mouth daily.   01/04/2021 at Unknown time  . telmisartan (MICARDIS) 80 MG tablet Take 80 mg by mouth daily.   01/05/2021 at 0500am  . vitamin B-12 (CYANOCOBALAMIN) 1000  MCG tablet Take 1,000 mcg by mouth daily.   01/04/2021 at Unknown time     Allergies  Allergen Reactions  . Amlodipine Swelling    Makes his ankles and legs swell.  . Codeine Other (See Comments)    Gives him a Headache.     Past Medical History:  Diagnosis Date  . Allergic state   . Arthritis   . Asthma   . B12 deficiency   . Complication of anesthesia    Difficult to wake up with anesthesia from colonoscopy  . COPD (chronic obstructive pulmonary disease) ([place])   . Coronary artery disease   . Dysrhythmia   . Edema   . GERD (gastroesophageal reflux disease)   . Headache   . History of kidney stones   . Hypertension   . Palpitations   . Pulmonary nodules   . Sleep apnea     Review of systems:  Otherwise negative.    Physical Exam  Gen: Alert, oriented. Appears stated age.  HEENT: [place]/AT. PERRLA. Lungs: CTA, no wheezes. CV: RR nl S1, S2. Abd: soft, benign, no masses. BS+ Ext: No edema. Pulses 2+    Planned procedures: Proceed with EGD and colonoscopy. The patient understands the nature of the planned procedure, indications, risks, alternatives and potential complications including but not limited to bleeding, infection, perforation, damage to internal organs and possible oversedation/side effects from anesthesia. The patient agrees and gives consent to proceed.  Please refer to procedure notes for findings, recommendations  and patient disposition/instructions.     Ivory Bail K. Alice Reichert, M.D. Gastroenterology 01/05/2021  1:19 PM

## 2021-01-05 NOTE — Interval H&P Note (Signed)
History and Physical Interval Note:  01/05/2021 1:21 PM  Derrick Sheppard  has presented today for surgery, with the diagnosis of [place].  The various methods of treatment have been discussed with the patient and family. After consideration of risks, benefits and other options for treatment, the patient has consented to  Procedure(s): COLONOSCOPY WITH PROPOFOL (N/A) ESOPHAGOGASTRODUODENOSCOPY (EGD) WITH PROPOFOL (N/A) as a surgical intervention.  The patient's history has been reviewed, patient examined, no change in status, stable for surgery.  I have reviewed the patient's chart and labs.  Questions were answered to the patient's satisfaction.     [place], [place]

## 2021-01-05 NOTE — Op Note (Signed)
[facility] Gastroenterology Patient Name: Derrick Sheppard Procedure Date: 01/05/2021 1:22 PM MRN: [id number] Account #: [id number] Date of Birth: [birth year] Admit Type: Outpatient Age: 65 Room: [facility] ENDO ROOM 2 Gender: Male Note Status: Finalized Procedure:             Colonoscopy Indications:           Positive Cologuard test Providers:             Benay Pike. Alice Reichert MD, MD Referring MD:          Ramonita Lab, MD (Referring MD) Medicines:             Propofol per Anesthesia Complications:         No immediate complications. Procedure:             Pre-Anesthesia Assessment:                        - The risks and benefits of the procedure and the                         sedation options and risks were discussed with the                         patient. All questions were answered and informed                         consent was obtained.                        - Patient identification and proposed procedure were                         verified prior to the procedure by the nurse. The                         procedure was verified in the procedure room.                        - ASA Grade Assessment: III - A patient with severe                         systemic disease.                        - After reviewing the risks and benefits, the patient                         was deemed in satisfactory condition to undergo the                         procedure.                        After obtaining informed consent, the colonoscope was                         passed under direct vision. Throughout the procedure,                         the patient's blood pressure, pulse, and oxygen  saturations were monitored continuously. The                         Colonoscope was introduced through the anus and                         advanced to the the cecum, identified by appendiceal                         orifice and ileocecal valve. The colonoscopy was                          performed without difficulty. The patient tolerated                         the procedure well. The quality of the bowel                         preparation was adequate. The ileocecal valve,                         appendiceal orifice, and rectum were photographed. Findings:      The perianal and digital rectal examinations were normal. Pertinent       negatives include normal sphincter tone and no palpable rectal lesions.      Non-bleeding internal hemorrhoids were found during retroflexion. The       hemorrhoids were Grade I (internal hemorrhoids that do not prolapse).      A few small-mouthed diverticula were found in the sigmoid colon.      A 7 mm polyp was found in the ileocecal valve. The polyp was       semi-pedunculated. The polyp was removed with a hot snare. Resection and       retrieval were complete. To prevent bleeding after the polypectomy, one       hemostatic clip was successfully placed (MR conditional). There was no       bleeding during, or at the end, of the procedure.      A 20 mm polyp was found in the mid ascending colon. The polyp was       carpet-like, sessile and umbilicated. The polyp was removed with a       saline injection-lift technique using a hot snare. Resection and       retrieval were complete. To prevent bleeding after the polypectomy, one       hemostatic clip was successfully placed (MR conditional). There was no       bleeding at the end of the procedure.      A 8 mm polyp was found in the distal ascending colon. The polyp was       sessile. The polyp was removed with a hot snare. Resection and retrieval       were complete. To prevent bleeding after the polypectomy, one hemostatic       clip was successfully placed (MR conditional). There was no bleeding at       the end of the procedure.      A 12 mm polyp was found in the distal ascending colon. The polyp was       sessile. The polyp was removed with a hot snare. Resection and  retrieval       were  complete. To prevent bleeding after the polypectomy, one hemostatic       clip was successfully placed (MR conditional). There was no bleeding at       the end of the procedure.      Four sessile polyps were found in the distal ascending colon. The polyps       were 4 to 8 mm in size. These polyps were removed with a hot snare.       Resection and retrieval were complete.      A 15 mm polyp was found in the hepatic flexure. The polyp was sessile.       The polyp was removed with a saline injection-lift technique using a hot       snare. Resection and retrieval were complete. To prevent bleeding after       the polypectomy, one hemostatic clip was successfully placed (MR       conditional). There was no bleeding at the end of the procedure.      A 8 mm polyp was found in the descending colon. The polyp was sessile.       The polyp was removed with a hot snare. Resection and retrieval were       complete.      The exam was otherwise without abnormality. Impression:            - Non-bleeding internal hemorrhoids.                        - Diverticulosis in the sigmoid colon.                        - One 7 mm polyp at the ileocecal valve, removed with                         a hot snare. Resected and retrieved. Clip (MR                         conditional) was placed.                        - One 20 mm polyp in the mid ascending colon, removed                         using injection-lift and a hot snare. Resected and                         retrieved. Clip (MR conditional) was placed.                        - One 8 mm polyp in the distal ascending colon,                         removed with a hot snare. Resected and retrieved. Clip                         (MR conditional) was placed.                        - One 12 mm polyp in the distal ascending colon,  removed with a hot snare. Resected and retrieved. Clip                         (MR conditional)  was placed.                        - Four 4 to 8 mm polyps in the distal ascending colon,                         removed with a hot snare. Resected and retrieved.                        - One 15 mm polyp at the hepatic flexure, removed                         using injection-lift and a hot snare. Resected and                         retrieved. Clip (MR conditional) was placed.                        - One 8 mm polyp in the descending colon, removed with                         a hot snare. Resected and retrieved.                        - The examination was otherwise normal. Recommendation:        - Patient has a contact number available for                         emergencies. The signs and symptoms of potential                         delayed complications were discussed with the patient.                         Return to normal activities tomorrow. Written                         discharge instructions were provided to the patient.                        - Resume previous diet.                        - Continue present medications.                        - Repeat colonoscopy is recommended for surveillance.                         The colonoscopy date will be determined after                         pathology results from today's exam become available  for review.                        - Return to GI office PRN.                        - Resume Eliquis (apixaban) at prior dose tomorrow.                         Refer to managing physician for further adjustment of                         therapy.                        - The findings and recommendations were discussed with                         the patient. Procedure Code(s):     --- Professional ---                        [phone], Colonoscopy, flexible; with removal of                         tumor(s), polyp(s), or other lesion(s) by snare                         technique                        [id number],  Colonoscopy, flexible; with directed submucosal                         injection(s), any substance Diagnosis Code(s):     --- Professional ---                        K57.30, Diverticulosis of large intestine without                         perforation or abscess without bleeding                        R19.5, Other fecal abnormalities                        K63.5, Polyp of colon                        K64.0, First degree hemorrhoids CPT copyright 2019 American Medical Association. All rights reserved. The codes documented in this report are preliminary and upon coder review may  be revised to meet current compliance requirements. Efrain Sella MD, MD 01/05/2021 3:02:38 PM This report has been signed electronically. Number of Addenda: 0 Note Initiated On: 01/05/2021 1:22 PM Scope Withdrawal Time: 1 hour 0 minutes 13 seconds  Total Procedure Duration: 1 hour 6 minutes 17 seconds  Estimated Blood Loss:  Estimated blood loss was minimal.      [facility]

## 2021-01-06 ENCOUNTER — Encounter: Payer: Self-pay | Admitting: Internal Medicine

## 2021-01-06 NOTE — Anesthesia Postprocedure Evaluation (Signed)
Anesthesia Post Note  Patient: Derrick Sheppard  Procedure(s) Performed: COLONOSCOPY WITH PROPOFOL (N/A ) ESOPHAGOGASTRODUODENOSCOPY (EGD) WITH PROPOFOL (N/A )  Patient location during evaluation: Endoscopy Anesthesia Type: General Level of consciousness: awake and alert and oriented Pain management: pain level controlled Vital Signs Assessment: post-procedure vital signs reviewed and stable Respiratory status: spontaneous breathing Cardiovascular status: blood pressure returned to baseline Anesthetic complications: no   No complications documented.   Last Vitals:  Vitals:   01/05/21 1517 01/05/21 1527  BP: 114/69 134/87  Pulse: (!) 56 (!) 53  Resp: 16 13  Temp:    SpO2: 96% 95%    Last Pain:  Vitals:   01/05/21 1527  TempSrc:   PainSc: 0-No pain                 Brynlei Klausner

## 2021-01-07 LAB — SURGICAL PATHOLOGY

## 2021-05-14 ENCOUNTER — Other Ambulatory Visit: Payer: Self-pay

## 2021-05-14 ENCOUNTER — Ambulatory Visit: Payer: Medicare HMO | Admitting: Dermatology

## 2021-05-14 ENCOUNTER — Encounter: Payer: Self-pay | Admitting: Dermatology

## 2021-05-14 DIAGNOSIS — L578 Other skin changes due to chronic exposure to nonionizing radiation: Secondary | ICD-10-CM | POA: Diagnosis not present

## 2021-05-14 DIAGNOSIS — L570 Actinic keratosis: Secondary | ICD-10-CM | POA: Diagnosis not present

## 2021-05-14 DIAGNOSIS — R21 Rash and other nonspecific skin eruption: Secondary | ICD-10-CM

## 2021-05-14 DIAGNOSIS — Z1283 Encounter for screening for malignant neoplasm of skin: Secondary | ICD-10-CM

## 2021-05-14 DIAGNOSIS — L820 Inflamed seborrheic keratosis: Secondary | ICD-10-CM | POA: Diagnosis not present

## 2021-05-14 MED ORDER — DOXYCYCLINE MONOHYDRATE 100 MG PO TABS: 100.0000 mg | ORAL_TABLET | Freq: Two times a day (BID) | ORAL | 0 refills | Status: DC

## 2021-05-14 NOTE — Patient Instructions (Signed)

## 2021-05-14 NOTE — Progress Notes (Signed)
New Patient Visit  Subjective  Derrick Sheppard is a 65 y.o. male who presents for the following: Upper body skin exam, Rash (L shoulder, R abdomen, itching, just noticed the r abdomen), and hx of tick bite (Back, x 66month pt states he has felt fine up until this morning he felt like he had a fever but wife checked and he didn't have a fever.). He has other spots he would like evaluated.  New patient referral from Dr. BRamonita LabIII.  Patient accompanied by wife who contributes to history.  The following portions of the chart were reviewed this encounter and updated as appropriate:   Tobacco  Allergies  Meds  Problems  Med Hx  Surg Hx  Fam Hx      Review of Systems:  No other skin or systemic complaints except as noted in HPI or Assessment and Plan.  Objective  Well appearing patient in no apparent distress; mood and affect are within normal limits.  All skin waist up examined.  R lower abdomen, L chest/ant shoulder Pink macular morbilliform eruption       bil ears x 10 (10) Pink scaly macules   R cheek x 1, L neck x 1, back x 2 Total = 4 (4) Erythematous keratotic or waxy stuck-on papule or plaque.    Assessment & Plan   Actinic Damage - chronic, secondary to cumulative UV radiation exposure/sun exposure over time - diffuse scaly erythematous macules with underlying dyspigmentation - Recommend daily broad spectrum sunscreen SPF 30+ to sun-exposed areas, reapply every 2 hours as needed.  - Recommend staying in the shade or wearing long sleeves, sun glasses (UVA+UVB protection) and wide brim hats (4-inch brim around the entire circumference of the hat). - Call for new or changing lesions.  Rash -appears most consistent with hypersensitivity reaction. I am most concerned about this rash being coincident with tick bite on his back.  This may be consistent with tickborne illness/infectious disease such as Lyme disease or others. R lower abdomen, L chest/ant  shoulder  Hypersensitivity Reaction Hx of tick bite 1 month ago on L back  Recommend start Doxycycline '100mg'$  1 po bid with food and drink for 4 weeks  May use TMC 0.1% cr qd/bid aa abdomen/L ant shoulder until clear, avoid f/g/a (pt has cream at home)  Will plan biopsy on f/u if rash still present  Topical steroids (such as triamcinolone, fluocinolone, fluocinonide, mometasone, clobetasol, halobetasol, betamethasone, hydrocortisone) can cause thinning and lightening of the skin if they are used for too long in the same area. Your physician has selected the right strength medicine for your problem and area affected on the body. Please use your medication only as directed by your physician to prevent side effects.    Related Medications doxycycline (ADOXA) 100 MG tablet Take 1 tablet (100 mg total) by mouth 2 (two) times daily. Take with food and drink  AK (actinic keratosis) (10) bil ears x 10  Destruction of lesion - bil ears x 10 Complexity: simple   Destruction method: cryotherapy   Informed consent: discussed and consent obtained   Timeout:  patient name, date of birth, surgical site, and procedure verified Lesion destroyed using liquid nitrogen: Yes   Region frozen until ice ball extended beyond lesion: Yes   Outcome: patient tolerated procedure well with no complications   Post-procedure details: wound care instructions given    Inflamed seborrheic keratosis R cheek x 1, L neck x 1, back x 2 Total =  4  Destruction of lesion - R cheek x 1, L neck x 1, back x 2 Total = 4 Complexity: simple   Destruction method: cryotherapy   Informed consent: discussed and consent obtained   Timeout:  patient name, date of birth, surgical site, and procedure verified Lesion destroyed using liquid nitrogen: Yes   Region frozen until ice ball extended beyond lesion: Yes   Outcome: patient tolerated procedure well with no complications   Post-procedure details: wound care instructions given     Actinic Damage - chronic, secondary to cumulative UV radiation exposure/sun exposure over time - diffuse scaly erythematous macules with underlying dyspigmentation - Recommend daily broad spectrum sunscreen SPF 30+ to sun-exposed areas, reapply every 2 hours as needed.  - Recommend staying in the shade or wearing long sleeves, sun glasses (UVA+UVB protection) and wide brim hats (4-inch brim around the entire circumference of the hat). - Call for new or changing lesions.  Return in about 6 weeks (around 06/25/2021) for f/u rash.  I, Othelia Pulling, RMA, am acting as scribe for Sarina Ser, MD . Documentation: I have reviewed the above documentation for accuracy and completeness, and I agree with the above.  Sarina Ser, MD

## 2021-06-25 ENCOUNTER — Ambulatory Visit: Payer: Medicare HMO | Admitting: Dermatology

## 2021-06-25 ENCOUNTER — Other Ambulatory Visit: Payer: Self-pay

## 2021-06-25 DIAGNOSIS — R21 Rash and other nonspecific skin eruption: Secondary | ICD-10-CM

## 2021-06-25 DIAGNOSIS — L570 Actinic keratosis: Secondary | ICD-10-CM

## 2021-06-25 DIAGNOSIS — L578 Other skin changes due to chronic exposure to nonionizing radiation: Secondary | ICD-10-CM

## 2021-06-25 DIAGNOSIS — L820 Inflamed seborrheic keratosis: Secondary | ICD-10-CM

## 2021-06-25 NOTE — Patient Instructions (Signed)

## 2021-06-25 NOTE — Progress Notes (Signed)
   Follow-Up Visit   Subjective  Derrick Sheppard is a 65 y.o. male who presents for the following: Rash (6 weeks f/u hypersensitive reaction taking Doxycyline and using TMC cream prn with a good response ). Recheck irritated spots on the face and ears.   The following portions of the chart were reviewed this encounter and updated as appropriate:   Tobacco  Allergies  Meds  Problems  Med Hx  Surg Hx  Fam Hx     Review of Systems:  No other skin or systemic complaints except as noted in HPI or Assessment and Plan.  Objective  Well appearing patient in no apparent distress; mood and affect are within normal limits.  A focused examination was performed including face,chest,abdomen,back . Relevant physical exam findings are noted in the Assessment and Plan.  right lower abdomen, L chest/shoulder Faint pink modeled appearance at the left shoulder/chest       ears, cheek (6) Erythematous thin papules/macules with gritty scale.   left cheek  (2) (2) Erythematous keratotic or waxy stuck-on papule or plaque.    Assessment & Plan  Rash right lower abdomen, L chest/shoulder  Rash -appears most consistent with hypersensitivity reaction from previous Tick bite on the back- improving on current treatment with topical steroids and doxycycline.  We decided last visit to cover him for possibility of Lyme disease.  Complete Doxycycline 100 mg 10 days remaining Cont Triamcinolone cream prn   If any changes return to the clinic   Related Medications doxycycline (ADOXA) 100 MG tablet Take 1 tablet (100 mg total) by mouth 2 (two) times daily. Take with food and drink  AK (actinic keratosis) (6) ears, cheek  Destruction of lesion - ears, cheek Complexity: simple   Destruction method: cryotherapy   Informed consent: discussed and consent obtained   Timeout:  patient name, date of birth, surgical site, and procedure verified Lesion destroyed using liquid nitrogen: Yes   Region  frozen until ice ball extended beyond lesion: Yes   Outcome: patient tolerated procedure well with no complications   Post-procedure details: wound care instructions given    Inflamed seborrheic keratosis left cheek  (2)  Destruction of lesion - left cheek  (2) Complexity: simple   Destruction method: cryotherapy   Informed consent: discussed and consent obtained   Timeout:  patient name, date of birth, surgical site, and procedure verified Lesion destroyed using liquid nitrogen: Yes   Region frozen until ice ball extended beyond lesion: Yes   Outcome: patient tolerated procedure well with no complications   Post-procedure details: wound care instructions given    Actinic Damage - chronic, secondary to cumulative UV radiation exposure/sun exposure over time - diffuse scaly erythematous macules with underlying dyspigmentation - Recommend daily broad spectrum sunscreen SPF 30+ to sun-exposed areas, reapply every 2 hours as needed.  - Recommend staying in the shade or wearing long sleeves, sun glasses (UVA+UVB protection) and wide brim hats (4-inch brim around the entire circumference of the hat). - Call for new or changing lesions.  Return in about 6 months (around 12/23/2021) for Aks .  IMarye Round, CMA, am acting as scribe for Sarina Ser, MD .  Documentation: I have reviewed the above documentation for accuracy and completeness, and I agree with the above.  Sarina Ser, MD

## 2021-06-28 ENCOUNTER — Encounter: Payer: Self-pay | Admitting: Dermatology

## 2021-07-28 ENCOUNTER — Other Ambulatory Visit: Payer: Medicare HMO

## 2021-07-28 ENCOUNTER — Other Ambulatory Visit: Payer: Self-pay

## 2021-07-28 DIAGNOSIS — R972 Elevated prostate specific antigen [PSA]: Secondary | ICD-10-CM

## 2021-07-29 LAB — PSA TOTAL (REFLEX TO FREE): Prostate Specific Ag, Serum: 3.8 ng/mL (ref 0.0–4.0)

## 2021-07-30 ENCOUNTER — Ambulatory Visit: Payer: Medicare HMO | Admitting: Urology

## 2021-07-30 ENCOUNTER — Other Ambulatory Visit: Payer: Self-pay

## 2021-07-30 ENCOUNTER — Encounter: Payer: Self-pay | Admitting: Urology

## 2021-07-30 VITALS — BP 126/65 | HR 75 | Ht 67.0 in | Wt 188.0 lb

## 2021-07-30 DIAGNOSIS — Z125 Encounter for screening for malignant neoplasm of prostate: Secondary | ICD-10-CM

## 2021-07-30 NOTE — Progress Notes (Signed)
   07/30/2021 1:38 PM   Derrick Sheppard 05-29-1956 [id number]  Reason for visit: Follow up PSA screening  HPI:  His PSA has varied from 4 to 5.6 over the last few years.  His DRE was normal with Dr. Junious Silk in June 2019, and he has a 70 g prostate on a recent CT scan.  PSA density is reassuring 0.07.  He denies any urinary symptoms or gross hematuria.  There is no family history of prostate cancer.  He denies any gross hematuria or urinary symptoms.  He denies any urologic changes in the last year   PSA this year is stable at 3.8 from 3.9 prior, and from 4.2(26% free) October 2020.   We reviewed at length the risks and benefits of PSA screening, as well as AUA guidelines that do not recommend routine screening in men over age 89.  With his very low PSA density, elevated percentage free PSA, and stable PSA over the last few years, his borderline PSA value is likely secondary to an enlarged prostate.  With these reassuring findings I recommended resuming screening every other year per the AUA guideline recommendations  RTC 2 years with PSA reflex to free prior   Billey Co, MD  [facility] [address] [place],  [address] [phone]

## 2021-07-30 NOTE — Patient Instructions (Signed)
Prostate Cancer Screening Prostate cancer screening is a test that is done to check for the presence of prostate cancer in men. The prostate gland is a walnut-sized gland that is located below the bladder and in front of the rectum in males. The function of the prostate is to add fluid to semen during ejaculation. Prostate cancer is the second most common type of cancer in men. Who should have prostate cancer screening? Screening recommendations vary based on age and other risk factors. Screening is recommended if: You are older than age 61. If you are age 48-69, talk with your health care provider about your need for screening and how often screening should be done. Because most prostate cancers are slow growing and will not cause death, screening is generally reserved in this age group for men who have a 10-15-year life expectancy. You are younger than age 48, and you have these risk factors: Being a [place] male or a male of African descent. Having a father, brother, or uncle who has been diagnosed with prostate cancer. The risk is higher if your family member's cancer occurred at an early age. Screening is not recommended if: You are younger than age 14. You are between the ages of 29 and 71 and you have no risk factors. You are 45 years of age or older. At this age, the risks that screening can cause are greater than the benefits that it may provide. If you are at high risk for prostate cancer, your health care provider may recommend that you have screenings more often or that you start screening at a younger age. How is screening for prostate cancer done? The recommended prostate cancer screening test is a blood test called the prostate-specific antigen (PSA) test. PSA is a protein that is made in the prostate. As you age, your prostate naturally produces more PSA. Abnormally high PSA levels may be caused by: Prostate cancer. An enlarged prostate that is not caused by cancer (benign prostatic  hyperplasia, BPH). This condition is very common in older men. A prostate gland infection (prostatitis). Depending on the PSA results, you may need more tests, such as: A physical exam to check the size of your prostate gland. Blood and imaging tests. A procedure to remove tissue samples from your prostate gland for testing (biopsy). What are the benefits of prostate cancer screening? Screening can help to identify cancer at an early stage, before symptoms start and when the cancer can be treated more easily. There is a small chance that screening may lower your risk of dying from prostate cancer. The chance is small because prostate cancer is a slow-growing cancer, and most men with prostate cancer die from a different cause. What are the risks of prostate cancer screening? The main risk of prostate cancer screening is diagnosing and treating prostate cancer that would never have caused any symptoms or problems. This is called overdiagnosisand overtreatment. PSA screening cannot tell you if your PSA is high due to cancer or a different cause. A prostate biopsy is the only procedure to diagnose prostate cancer. Even the results of a biopsy may not tell you if your cancer needs to be treated. Slow-growing prostate cancer may not need any treatment other than monitoring, so diagnosing and treating it may cause unnecessary stress or other side effects. Questions to ask your health care provider When should I start prostate cancer screening? What is my risk for prostate cancer? How often do I need screening? What type of screening tests do  I need? How do I get my test results? What do my results mean? Do I need treatment? Where to find more information The American Cancer Society: www.cancer.org American Urological Association: www.auanet.org Contact a health care provider if: You have difficulty urinating. You have pain when you urinate or ejaculate. You have blood in your urine or semen. You  have pain in your back or in the area of your prostate. Summary Prostate cancer is a common type of cancer in men. The prostate gland is located below the bladder and in front of the rectum. This gland adds fluid to semen during ejaculation. Prostate cancer screening may identify cancer at an early stage, when the cancer can be treated more easily. The prostate-specific antigen (PSA) test is the recommended screening test for prostate cancer. Discuss the risks and benefits of prostate cancer screening with your health care provider. If you are age 72 or older, the risks that screening can cause are greater than the benefits that it may provide. This information is not intended to replace advice given to you by your health care provider. Make sure you discuss any questions you have with your health care provider. Document Revised: 11/28/2020 Document Reviewed: 05/10/2019 Elsevier Patient Education  [place].

## 2021-09-13 ENCOUNTER — Emergency Department: Payer: Medicare HMO

## 2021-09-13 ENCOUNTER — Emergency Department
Admission: EM | Admit: 2021-09-13 | Discharge: 2021-09-13 | Disposition: A | Discharge status: Home / Self Care | Payer: Medicare HMO | Attending: Emergency Medicine | Admitting: Emergency Medicine

## 2021-09-13 ENCOUNTER — Other Ambulatory Visit: Payer: Self-pay

## 2021-09-13 ENCOUNTER — Encounter: Payer: Self-pay | Admitting: Emergency Medicine

## 2021-09-13 DIAGNOSIS — I10 Essential (primary) hypertension: Secondary | ICD-10-CM | POA: Diagnosis not present

## 2021-09-13 DIAGNOSIS — R1013 Epigastric pain: Secondary | ICD-10-CM | POA: Diagnosis present

## 2021-09-13 DIAGNOSIS — I2510 Atherosclerotic heart disease of native coronary artery without angina pectoris: Secondary | ICD-10-CM | POA: Diagnosis not present

## 2021-09-13 DIAGNOSIS — J45909 Unspecified asthma, uncomplicated: Secondary | ICD-10-CM | POA: Diagnosis not present

## 2021-09-13 DIAGNOSIS — Z7901 Long term (current) use of anticoagulants: Secondary | ICD-10-CM | POA: Diagnosis not present

## 2021-09-13 DIAGNOSIS — Z7952 Long term (current) use of systemic steroids: Secondary | ICD-10-CM | POA: Diagnosis not present

## 2021-09-13 DIAGNOSIS — J449 Chronic obstructive pulmonary disease, unspecified: Secondary | ICD-10-CM | POA: Diagnosis not present

## 2021-09-13 DIAGNOSIS — R55 Syncope and collapse: Secondary | ICD-10-CM | POA: Diagnosis not present

## 2021-09-13 DIAGNOSIS — Z87891 Personal history of nicotine dependence: Secondary | ICD-10-CM | POA: Diagnosis not present

## 2021-09-13 LAB — CBC
HCT: 43 % (ref 39.0–52.0)
Hemoglobin: 14.5 g/dL (ref 13.0–17.0)
MCH: 29.4 pg (ref 26.0–34.0)
MCHC: 33.7 g/dL (ref 30.0–36.0)
MCV: 87 fL (ref 80.0–100.0)
Platelets: 202 10*3/uL (ref 150–400)
RBC: 4.94 MIL/uL (ref 4.22–5.81)
RDW: 12.7 % (ref 11.5–15.5)
WBC: 9.4 10*3/uL (ref 4.0–10.5)
nRBC: 0 % (ref 0.0–0.2)

## 2021-09-13 LAB — TROPONIN I (HIGH SENSITIVITY)
Troponin I (High Sensitivity): 6 ng/L (ref <18)
Troponin I (High Sensitivity): 6 ng/L (ref <18)

## 2021-09-13 LAB — COMPREHENSIVE METABOLIC PANEL
ALT: 42 U/L (ref 0–44)
AST: 45 U/L — ABNORMAL HIGH (ref 15–41)
Albumin: 4.1 g/dL (ref 3.5–5.0)
Alkaline Phosphatase: 359 U/L — ABNORMAL HIGH (ref 38–126)
Anion gap: 6 (ref 5–15)
BUN: 13 mg/dL (ref 8–23)
CO2: 24 mmol/L (ref 22–32)
Calcium: 9.1 mg/dL (ref 8.9–10.3)
Chloride: 102 mmol/L (ref 98–111)
Creatinine, Ser: 1.02 mg/dL (ref 0.61–1.24)
GFR, Estimated: >60 mL/min (ref >60)
Glucose, Bld: 120 mg/dL — ABNORMAL HIGH (ref 70–99)
Potassium: 3.9 mmol/L (ref 3.5–5.1)
Sodium: 132 mmol/L — ABNORMAL LOW (ref 135–145)
Total Bilirubin: 1.2 mg/dL (ref 0.3–1.2)
Total Protein: 7.5 g/dL (ref 6.5–8.1)

## 2021-09-13 LAB — LIPASE, BLOOD: Lipase: 40 U/L (ref 11–51)

## 2021-09-13 MED ORDER — SODIUM CHLORIDE 0.9 % IV BOLUS
1000.0000 mL | Freq: Once | INTRAVENOUS | Status: AC
  Administered 2021-09-13: 16:00:00 1000 mL via INTRAVENOUS

## 2021-09-13 MED ORDER — IOHEXOL 350 MG/ML SOLN
100.0000 mL | Freq: Once | INTRAVENOUS | Status: AC | PRN
  Administered 2021-09-13: 17:00:00 100 mL via INTRAVENOUS

## 2021-09-13 NOTE — Discharge Instructions (Signed)
Your lab tests and CT scan are all okay.  Please follow a bland diet, drink plenty of fluids and allow yourself extra rest over the next few days while monitoring your symptoms.

## 2021-09-13 NOTE — ED Triage Notes (Incomplete)
Pt via EMS from church. Pt states he had a syncopal episode at church, pt had sudden epigastric abd pain and vomiting. Pt states that after vomiting he passed out for a couple of second. Denies head injury but states he is on blood thinners. Pt is A&Ox4 and NAD.

## 2021-09-13 NOTE — ED Provider Notes (Signed)
[facility] Emergency Department Provider Note  ____________________________________________  Time seen: Approximately 7:14 PM  I have reviewed the triage vital signs and the nursing notes.   HISTORY  Chief Complaint Abdominal Pain and Loss of Consciousness    HPI Derrick Sheppard is a 65 y.o. male with a history of COPD, hypertension, GERD who reports being in his usual state of health until this morning when he was at church.  He started to sing in the choir when he started to feel like he was having chest pain.  When he returned to his seat, he started having abdominal pain and nausea.  He felt lightheaded.  He went to the bathroom where he then vomited and passed out.  He woke back up and was having ongoing upper abdominal pain.  He sat in a chair until he could be helped home by his family.  Denies any current chest pain, no shortness of breath.  Pain is all epigastric, nonradiating, no aggravating or alleviating factors currently.  Pain is moderate intensity.    Past Medical History:  Diagnosis Date   Allergic state    Arthritis    Asthma    B12 deficiency    Complication of anesthesia    Difficult to wake up with anesthesia from colonoscopy   COPD (chronic obstructive pulmonary disease) (HCC)    Coronary artery disease    Dysrhythmia    Edema    GERD (gastroesophageal reflux disease)    Headache    History of kidney stones    Hypertension    Palpitations    Pulmonary nodules    Sleep apnea      Patient Active Problem List   Diagnosis Date Noted   Arthritis 07/30/2020   B12 deficiency 07/30/2020   Coronary artery disease 07/30/2020   Edema 07/30/2020   GERD (gastroesophageal reflux disease) 07/30/2020   History of 2019 novel coronavirus disease (COVID-19) 06/24/2020   SOBOE (shortness of breath on exertion) 05/19/2020   COPD with acute exacerbation (HCC)    Hiccups    Generalized abdominal pain    Hypotension    Acute  respiratory failure with hypoxia ([place]) 02/25/2020   Acute cholecystitis 02/23/2020   Chronic cholecystitis 02/21/2020   Paroxysmal atrial fibrillation ([place]) 11/05/2019   History of stroke 04/30/2019   PFO with atrial septal aneurysm 03/01/2019   Bilateral carotid artery stenosis 02/12/2019   Stroke (cerebrum) ([place]) 12/18/2018   Nocturnal hypoxia 07/18/2018   Elevated PSA 07/12/2018   Chest trauma    Pneumothorax    Emphysema lung (HCC)    Tobacco abuse    Pain    Acute blood loss anemia    Tachypnea    Urinary retention    Community acquired pneumonia    Accidental fall from ladder    Nodule of lower lobe of left lung    Closed fracture of one rib of right side    Closed fracture of four ribs of right side    Pneumothorax, traumatic    Respiratory distress    Multiple rib fractures 08/30/2017   Aortic atherosclerosis ([place]) 07/14/2017   Senile purpura ([place]) 07/14/2016   Essential hypertension 10/22/2014     Past Surgical History:  Procedure Laterality Date   CHOLECYSTECTOMY     COLONOSCOPY     COLONOSCOPY WITH PROPOFOL N/A 04/09/2016   Procedure: COLONOSCOPY WITH PROPOFOL;  Surgeon: Lollie Sails, MD;  Location: [facility] ENDOSCOPY;  Service: Endoscopy;  Laterality: N/A;   COLONOSCOPY WITH PROPOFOL N/A  01/05/2021   Procedure: COLONOSCOPY WITH PROPOFOL;  Surgeon: Toledo, Benay Pike, MD;  Location: [facility] ENDOSCOPY;  Service: Gastroenterology;  Laterality: N/A;   ESOPHAGOGASTRODUODENOSCOPY (EGD) WITH PROPOFOL N/A 01/05/2021   Procedure: ESOPHAGOGASTRODUODENOSCOPY (EGD) WITH PROPOFOL;  Surgeon: Toledo, Benay Pike, MD;  Location: [facility] ENDOSCOPY;  Service: Gastroenterology;  Laterality: N/A;   TEE WITHOUT CARDIOVERSION N/A 02/21/2019   Procedure: TRANSESOPHAGEAL ECHOCARDIOGRAM (TEE);  Surgeon: Corey Skains, MD;  Location: [facility] ORS;  Service: Cardiovascular;  Laterality: N/A;     Prior to Admission medications   Medication Sig Start Date End Date Taking? Authorizing Provider   ADVAIR DISKUS 250-50 MCG/DOSE AEPB SMARTSIG:1 Inhalation Via Inhaler Every 12 Hours 07/09/20   [provider]  albuterol (PROVENTIL HFA;VENTOLIN HFA) 108 (90 Base) MCG/ACT inhaler Inhale 2 puffs into the lungs every 6 (six) hours as needed for wheezing or shortness of breath.    [provider]  Cholecalciferol (VITAMIN D3) 50 MCG (2000 UT) capsule Take 2,000 Units by mouth daily.     [provider]  ELIQUIS 5 MG TABS tablet Take 5 mg by mouth 2 (two) times daily. 01/28/20   [provider]  INCRUSE ELLIPTA 62.5 MCG/INH AEPB Inhale 1 puff into the lungs daily. 07/17/20   [provider]  mirtazapine (REMERON) 7.5 MG tablet Take 7.5 mg by mouth at bedtime as needed (anxiousness/rest).  Patient not taking: Reported on 07/30/2021    [provider]  Omeprazole 20 MG TBDD Take by mouth.    [provider]  pantoprazole (PROTONIX) 40 MG tablet Take 40 mg by mouth 2 (two) times a day. 01/27/19   [provider]  potassium chloride (KLOR-CON) 10 MEQ tablet Take 10 mEq by mouth daily. 05/28/19   [provider]  telmisartan (MICARDIS) 80 MG tablet Take 80 mg by mouth daily. 12/21/18   [provider]  triamcinolone cream (KENALOG) 0.1 % SMARTSIG:1 Application Topical 2-3 Times Daily 05/11/21   [provider]  vitamin B-12 (CYANOCOBALAMIN) 1000 MCG tablet Take 1,000 mcg by mouth daily.    [provider]     Allergies Amlodipine and Codeine   Family History  Problem Relation Age of Onset   Colon cancer Father 78       died of cancer   Stomach cancer Father    Cancer - Colon Father    Hypertension Mother    Prostate cancer Neg Hx    Bladder Cancer Neg Hx    Kidney cancer Neg Hx     Social History Social History   Tobacco Use   Smoking status: Former    Packs/day: 1.50    Years: 46.00    Pack years: 69.00    Types: Cigarettes    Quit date: 09/01/2017    Years since quitting: 4.0    Smokeless tobacco: Never  Vaping Use   Vaping Use: Never used  Substance Use Topics   Alcohol use: No   Drug use: No    Review of Systems  Constitutional:   No fever or chills.  ENT:   No sore throat. No rhinorrhea. Cardiovascular:   Positive chest pain and syncope. Respiratory:   No dyspnea or cough. Gastrointestinal:   Positive epigastric pain and vomiting.  Musculoskeletal:   Negative for focal pain or swelling All other systems reviewed and are negative except as documented above in ROS and HPI.  ____________________________________________   PHYSICAL EXAM:  VITAL SIGNS: ED Triage Vitals  Enc Vitals Group     BP  09/13/21 1324 126/80     Pulse Rate 09/13/21 1324 (!) 51     Resp 09/13/21 1324 20     Temp 09/13/21 1324 98 F (36.7 C)     Temp Source 09/13/21 1324 Oral     SpO2 09/13/21 1324 94 %     Weight 09/13/21 1325 180 lb (81.6 kg)     Height 09/13/21 1325 5\' 7"  (1.702 m)     Head Circumference --      Peak Flow --      Pain Score 09/13/21 1325 0     Pain Loc --      Pain Edu? --      Excl. in [place]? --     Vital signs reviewed, nursing assessments reviewed.   Constitutional:   Alert and oriented. Non-toxic appearance. Eyes:   Conjunctivae are normal. EOMI. PERRL. ENT      Head:   Normocephalic and atraumatic.      Nose:   Normal      Mouth/Throat:   Normal, moist mucosa.      Neck:   No meningismus. Full ROM. Hematological/Lymphatic/Immunilogical:   No cervical lymphadenopathy. Cardiovascular:   RRR. Symmetric bilateral radial and DP pulses.  No murmurs. Cap refill less than 2 seconds. Respiratory:   Normal respiratory effort without tachypnea/retractions. Breath sounds are clear and equal bilaterally. No wheezes/rales/rhonchi. Gastrointestinal:   Soft with mild epigastric tenderness. Non distended. There is no CVA tenderness.  No rebound, rigidity, or guarding. Genitourinary:   deferred Musculoskeletal:   Normal range of motion in all extremities. No  joint effusions.  No lower extremity tenderness.  No edema. Neurologic:   Normal speech and language.  Motor grossly intact. No acute focal neurologic deficits are appreciated.  Skin:    Skin is warm, dry and intact. No rash noted.  No petechiae, purpura, or bullae.  ____________________________________________    LABS (pertinent positives/negatives) (all labs ordered are listed, but only abnormal results are displayed) Labs Reviewed  COMPREHENSIVE METABOLIC PANEL - Abnormal; Notable for the following components:      Result Value   Sodium 132 (*)    Glucose, Bld 120 (*)    AST 45 (*)    Alkaline Phosphatase 359 (*)    All other components within normal limits  LIPASE, BLOOD  CBC  URINALYSIS, ROUTINE W REFLEX MICROSCOPIC  TROPONIN I (HIGH SENSITIVITY)  TROPONIN I (HIGH SENSITIVITY)   ____________________________________________   EKG  Interpreted by me Sinus bradycardia rate of 57, normal axis and intervals.  Poor R wave progression.  Normal ST segments and T waves.  3 PVCs on the strip.  ____________________________________________    RADIOLOGY  CT Angio Chest/Abd/Pel for Dissection W and/or Wo Contrast  Result Date: 09/13/2021 CLINICAL DATA:  Abdominal pain, aortic dissection suspected. Syncope EXAM: CT ANGIOGRAPHY CHEST, ABDOMEN AND PELVIS TECHNIQUE: Non-contrast CT of the chest was initially obtained. Multidetector CT imaging through the chest, abdomen and pelvis was performed using the standard protocol during bolus administration of intravenous contrast. Multiplanar reconstructed images and MIPs were obtained and reviewed to evaluate the vascular anatomy. CONTRAST:  148mL OMNIPAQUE IOHEXOL 350 MG/ML SOLN COMPARISON:  CT chest 11/21/2020 FINDINGS: CTA CHEST FINDINGS Cardiovascular Preferential opacification of the thoracic aorta.Mild-to-moderate atherosclerotic plaque of the thoracic aorta. No aneurysm or dissection. Normal heart size. At least two vessel coronary  calcification no significant pericardial effusion. The main pulmonary is normal in caliber. No central or segmental pulmonary embolus. Mediastinum/Nodes: No enlarged mediastinal, hilar, or axillary lymph  nodes. Thyroid gland, trachea, and esophagus demonstrate no significant findings. Lungs/Pleura: Mild centrilobular emphysematous changes. No focal consolidation. Stable 5 mm right lower lobe subpleural nodule (7:118). Stable 5 mm right lung nodule noted (7:86). Stable other 5 mm nodule noted within the right lower lobe (7:101). Persistent pleural based nodularity/irregularity along the left lower lobe (7:90). Query associated pleural base nodules measuring 2.3 x 0.7 cm and 0.5 x 0.5 cm. No new pulmonary nodule. No pulmonary mass. No pleural effusion. No pneumothorax. Musculoskeletal: No chest wall abnormality. No suspicious lytic or blastic osseous lesions. No acute displaced fracture. Multilevel degenerative changes of the spine. Review of the MIP images confirms the above findings. CTA ABDOMEN AND PELVIS FINDINGS VASCULAR Aorta: Severe atherosclerotic plaque. Normal caliber aorta without aneurysm, dissection, vasculitis or significant stenosis. Celiac: Patent without evidence of aneurysm, dissection, vasculitis or significant stenosis. SMA: At least moderate to severe atherosclerotic plaque of its origin. Patent without evidence of aneurysm, dissection, vasculitis or significant stenosis. Renals: At least mild atherosclerotic plaque of the origins. Both renal arteries are patent without evidence of aneurysm, dissection, vasculitis, fibromuscular dysplasia or significant stenosis. IMA: Patent without evidence of aneurysm, dissection, vasculitis or significant stenosis. Inflow: Moderate to severe atherosclerotic plaque. Patent without evidence of aneurysm, dissection, vasculitis or significant stenosis. Veins: No obvious venous abnormality within the limitations of this arterial phase study. Review of the MIP images  confirms the above findings. NON-VASCULAR Hepatobiliary: No focal liver abnormality. Status post cholecystectomy. No biliary dilatation. Pancreas: No focal lesion. Normal pancreatic contour. No surrounding inflammatory changes. No main pancreatic ductal dilatation. Spleen: Normal in size without focal abnormality. Adrenals/Urinary Tract: No adrenal nodule bilaterally. Bilateral kidneys enhance symmetrically. No hydronephrosis. No hydroureter. The urinary bladder is unremarkable. Stomach/Bowel: Stomach is within normal limits. No evidence of bowel wall thickening or dilatation. Fatty infiltration of the wall of the ascending colon suggestive of chronic inflammatory changes. Appendix appears normal. Lymphatic: No lymphadenopathy. Reproductive: The prostate is enlarged measuring up to 5.6 cm. Other: No intraperitoneal free fluid. No intraperitoneal free gas. No organized fluid collection. Musculoskeletal: No abdominal wall hernia or abnormality. Densely sclerotic lesion within the right sacrum likely a bone island. No suspicious lytic or blastic osseous lesions. No acute displaced fracture. Multilevel degenerative changes of the spine. Review of the MIP images confirms the above findings. IMPRESSION: 1. No acute thoracic abnormality. Aortic Atherosclerosis (ICD10-I70.0) - severe including at least 2 vessel coronary calcification. 2. No pulmonary embolus. 3. Persistent pleural based nodularity/irregularity along the left lower lobe with question of persistent pleural based nodules measuring 2.3 x 0.7 cm and 0.5 x 0.5 cm. Stable three right lower lobe 5 mm subpleural nodules. 4. Prostatomegaly. 5.  Emphysema (ICD10-J43.9). Electronically Signed   By: Iven Finn M.D.   On: 09/13/2021 18:03    ____________________________________________   PROCEDURES Procedures  ____________________________________________  DIFFERENTIAL DIAGNOSIS   Pancreatitis, bowel obstruction, bowel perforation, AAA, aortic dissection,  non-STEMI  CLINICAL IMPRESSION / ASSESSMENT AND PLAN / ED COURSE  Medications ordered in the ED: Medications  sodium chloride 0.9 % bolus 1,000 mL (0 mLs Intravenous Stopped 09/13/21 1734)  iohexol (OMNIPAQUE) 350 MG/ML injection 100 mL (100 mLs Intravenous Contrast Given 09/13/21 1634)    Pertinent labs & imaging results that were available during my care of the patient were reviewed by me and considered in my medical decision making (see chart for details).  Derrick Sheppard was evaluated in Emergency Department on 09/13/2021 for the symptoms described in the history of present illness. He  was evaluated in the context of the global COVID-19 pandemic, which necessitated consideration that the patient might be at risk for infection with the SARS-CoV-2 virus that causes COVID-19. Institutional protocols and algorithms that pertain to the evaluation of patients at risk for COVID-19 are in a state of rapid change based on information released by regulatory bodies including the CDC and federal and state organizations. These policies and algorithms were followed during the patient's care in the ED.   Patient presents with chest pain and epigastric pain associated with syncope.  Symptoms are consistent with a vagal episode, but with the patient's age and ongoing epigastric pain, CT scan obtained which is fortunately unremarkable.  Lab panel including serial troponins is also unremarkable.  EKG nonischemic, vital signs unremarkable and patient is relatively well-appearing and comfortable.  Nontoxic.  Stable for discharge home to follow-up with PCP.      ____________________________________________   FINAL CLINICAL IMPRESSION(S) / ED DIAGNOSES    Final diagnoses:  Epigastric pain  Syncope, unspecified syncope type     ED Discharge Orders     None       Portions of this note were generated with dragon dictation software. Dictation errors may occur despite best attempts at  proofreading.    Carrie Mew, MD 09/13/21 1921

## 2021-09-13 NOTE — ED Triage Notes (Signed)
Pt in via EMS from home with c/o syncopal episode 2 hours ago while at church. Pt got home and started with abd pain. Pt vomited x's 1, HR 50, 138/92, 98% RA, #20g to right AC.

## 2021-09-22 ENCOUNTER — Other Ambulatory Visit: Payer: Self-pay | Admitting: Dermatology

## 2021-09-22 DIAGNOSIS — R21 Rash and other nonspecific skin eruption: Secondary | ICD-10-CM

## 2021-11-12 DIAGNOSIS — D6869 Other thrombophilia: Secondary | ICD-10-CM | POA: Insufficient documentation

## 2021-12-17 ENCOUNTER — Ambulatory Visit: Payer: Medicare HMO | Admitting: Dermatology

## 2021-12-17 ENCOUNTER — Other Ambulatory Visit: Payer: Self-pay

## 2021-12-17 DIAGNOSIS — L578 Other skin changes due to chronic exposure to nonionizing radiation: Secondary | ICD-10-CM | POA: Diagnosis not present

## 2021-12-17 DIAGNOSIS — L820 Inflamed seborrheic keratosis: Secondary | ICD-10-CM

## 2021-12-17 DIAGNOSIS — L720 Epidermal cyst: Secondary | ICD-10-CM

## 2021-12-17 DIAGNOSIS — L98491 Non-pressure chronic ulcer of skin of other sites limited to breakdown of skin: Secondary | ICD-10-CM

## 2021-12-17 DIAGNOSIS — L570 Actinic keratosis: Secondary | ICD-10-CM | POA: Diagnosis not present

## 2021-12-17 DIAGNOSIS — L97221 Non-pressure chronic ulcer of left calf limited to breakdown of skin: Secondary | ICD-10-CM | POA: Diagnosis not present

## 2021-12-17 MED ORDER — MUPIROCIN 2 % EX OINT: 1.0000 "application " | TOPICAL_OINTMENT | Freq: Two times a day (BID) | CUTANEOUS | 2 refills | Status: DC

## 2021-12-17 NOTE — Progress Notes (Deleted)
? ?  Follow-Up Visit ?  ?Subjective  ?Derrick Sheppard is a 66 y.o. male who presents for the following: Actinic Keratosis (6 month follow up of face and ears treated with LN2). ? ?Accompanied by wife ? ?The following portions of the chart were reviewed this encounter and updated as appropriate:  ?  ?  ? ?Review of Systems:  No other skin or systemic complaints except as noted in HPI or Assessment and Plan. ? ?Objective  ?Well appearing patient in no apparent distress; mood and affect are within normal limits. ? ?A focused examination was performed including face. Relevant physical exam findings are noted in the Assessment and Plan. ? ? ? ?Assessment & Plan  ? ?No follow-ups on file. ? ?I, Ashok Cordia, CMA, am acting as scribe for Sarina Ser, MD . ? ?

## 2021-12-17 NOTE — Patient Instructions (Signed)

## 2021-12-17 NOTE — Progress Notes (Unsigned)
° °  Follow-Up Visit   Subjective  Derrick Sheppard is a 66 y.o. male who presents for the following: Actinic Keratosis (6 month follow up of face and ears treated with LN2).  Accompanied by wife  The following portions of the chart were reviewed this encounter and updated as appropriate:       Review of Systems:  No other skin or systemic complaints except as noted in HPI or Assessment and Plan.  Objective  Well appearing patient in no apparent distress; mood and affect are within normal limits.  A focused examination was performed including scalp, face, ears, left leg. Relevant physical exam findings are noted in the Assessment and Plan.  Right cheek Erythematous stuck-on, waxy papule or plaque  Left Zygomatic Area Subcutaneous nodule. 1.3 cm  Face, ears (12) Erythematous thin papules/macules with gritty scale.   Left calf Crust         Assessment & Plan  Inflamed seborrheic keratosis Right cheek  Destruction of lesion - Right cheek Complexity: simple   Destruction method: cryotherapy   Informed consent: discussed and consent obtained   Timeout:  patient name, date of birth, surgical site, and procedure verified Lesion destroyed using liquid nitrogen: Yes   Region frozen until ice ball extended beyond lesion: Yes   Outcome: patient tolerated procedure well with no complications   Post-procedure details: wound care instructions given    Epidermal inclusion cyst Left Zygomatic Area  Discussed excision. 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 Recheck on follow up  mupirocin ointment (BACTROBAN) 2 % - Left calf Apply 1 application. topically 2 (two) times daily.   Return for Surgery cyst of left cheek, 4 months with Dr. Dara Lords, Ashok Cordia, CMA, am acting as scribe for Sarina Ser, MD .

## 2021-12-23 ENCOUNTER — Encounter: Payer: Self-pay | Admitting: Dermatology

## 2021-12-31 ENCOUNTER — Ambulatory Visit: Payer: Medicare HMO | Admitting: Dermatology

## 2022-02-02 ENCOUNTER — Encounter: Payer: Medicare HMO | Admitting: Dermatology

## 2022-03-11 ENCOUNTER — Ambulatory Visit: Payer: Medicare HMO | Admitting: Dermatology

## 2022-03-11 ENCOUNTER — Encounter: Payer: Self-pay | Admitting: Dermatology

## 2022-03-11 DIAGNOSIS — C44719 Basal cell carcinoma of skin of left lower limb, including hip: Secondary | ICD-10-CM | POA: Diagnosis not present

## 2022-03-11 DIAGNOSIS — C4491 Basal cell carcinoma of skin, unspecified: Secondary | ICD-10-CM

## 2022-03-11 DIAGNOSIS — D492 Neoplasm of unspecified behavior of bone, soft tissue, and skin: Secondary | ICD-10-CM

## 2022-03-11 HISTORY — DX: Basal cell carcinoma of skin, unspecified: C44.91

## 2022-03-11 NOTE — Progress Notes (Signed)
   Follow-Up Visit   Subjective  Derrick Sheppard is a 66 y.o. male who presents for the following: Skin Ulcer (2 month recheck. Patient states ulcer is no better. Mupirocin caused burning. Hit area yesterday. Area is burning and painful).  Wife with patient.   The following portions of the chart were reviewed this encounter and updated as appropriate:  Tobacco  Allergies  Meds  Problems  Med Hx  Surg Hx  Fam Hx     Review of Systems: No other skin or systemic complaints except as noted in HPI or Assessment and Plan.  Objective  Well appearing patient in no apparent distress; mood and affect are within normal limits.  A focused examination was performed including face, left leg. Relevant physical exam findings are noted in the Assessment and Plan.  left calf 1.7 x 1.2 cm ulcerated plaque      Assessment & Plan  Neoplasm of skin left calf  Skin / nail biopsy Type of biopsy: tangential   Informed consent: discussed and consent obtained   Timeout: patient name, date of birth, surgical site, and procedure verified   Procedure prep:  Patient was prepped and draped in usual sterile fashion Prep type:  Isopropyl alcohol Anesthesia: the lesion was anesthetized in a standard fashion   Anesthetic:  1% lidocaine w/ epinephrine 1-100,000 buffered w/ 8.4% NaHCO3 Instrument used: flexible razor blade   Hemostasis achieved with: pressure, aluminum chloride and electrodesiccation   Outcome: patient tolerated procedure well   Post-procedure details: sterile dressing applied and wound care instructions given   Dressing type: bandage and petrolatum    Specimen 1 - Surgical pathology Differential Diagnosis: Ulcer, R/O skin cancer Check Margins: No  Return for Follow Up As Scheduled.  I, Emelia Salisbury, CMA, am acting as scribe for Sarina Ser, MD. Documentation: I have reviewed the above documentation for accuracy and completeness, and I agree with the above.  Sarina Ser, MD

## 2022-03-11 NOTE — Patient Instructions (Signed)

## 2022-03-15 ENCOUNTER — Encounter: Payer: Self-pay | Admitting: Dermatology

## 2022-03-15 ENCOUNTER — Telehealth: Payer: Self-pay

## 2022-03-15 NOTE — Telephone Encounter (Signed)
-----   Message from Ralene Bathe, MD sent at 03/14/2022  5:41 PM EDT ----- Diagnosis Skin , left calf BASAL CELL CARCINOMA, NODULAR AND INFILTRATIVE PATTERNS, BASE INVOLVED  Cancer - [facility] Schedule surgery

## 2022-03-15 NOTE — Telephone Encounter (Signed)
Discussed biopsy results with pt return for surgery of [facility] 06/08/2022

## 2022-03-15 NOTE — Telephone Encounter (Signed)
Left message on voicemail to return my call.  

## 2022-04-06 ENCOUNTER — Ambulatory Visit: Payer: Medicare HMO | Admitting: Dermatology

## 2022-04-06 DIAGNOSIS — C44719 Basal cell carcinoma of skin of left lower limb, including hip: Secondary | ICD-10-CM | POA: Diagnosis not present

## 2022-04-06 DIAGNOSIS — C44619 Basal cell carcinoma of skin of left upper limb, including shoulder: Secondary | ICD-10-CM | POA: Diagnosis not present

## 2022-04-06 DIAGNOSIS — D492 Neoplasm of unspecified behavior of bone, soft tissue, and skin: Secondary | ICD-10-CM

## 2022-04-06 DIAGNOSIS — C4491 Basal cell carcinoma of skin, unspecified: Secondary | ICD-10-CM

## 2022-04-06 DIAGNOSIS — Z5189 Encounter for other specified aftercare: Secondary | ICD-10-CM

## 2022-04-06 HISTORY — DX: Basal cell carcinoma of skin, unspecified: C44.91

## 2022-04-06 MED ORDER — DOXYCYCLINE MONOHYDRATE 100 MG PO CAPS: 100.0000 mg | ORAL_CAPSULE | Freq: Two times a day (BID) | ORAL | 0 refills | Status: AC

## 2022-04-06 NOTE — Progress Notes (Signed)
Follow-Up Visit   Subjective  Derrick Sheppard is a 66 y.o. male who presents for the following: Wound Check (Patient here today for wound check. Patient has bx proven BCC at left calf and is scheduled for excision 06/08/2022.).  Left leg with biopsy proven infiltrative type BCC, awaiting treatment.   With significant pain at the area.  History of rash and discomfort with mupirocin, d/c'd a few weeks ago.   Patient accompanied by wife who contributes to history.   The following portions of the chart were reviewed this encounter and updated as appropriate:   Tobacco  Allergies  Meds  Problems  Med Hx  Surg Hx  Fam Hx      Review of Systems:  No other skin or systemic complaints except as noted in HPI or Assessment and Plan.  Objective  Well appearing patient in no apparent distress; mood and affect are within normal limits.  A focused examination was performed including arms,left calf. Relevant physical exam findings are noted in the Assessment and Plan.  Left Forearm 0.7 cm excoriated pink papule with arborizing telangiectasia     left calf 1.9 cm ulcer within macerated plaque with surrounding erythema         Assessment & Plan  Neoplasm of skin Left Forearm  Epidermal / dermal shaving  Lesion diameter (cm):  0.7 Informed consent: discussed and consent obtained   Timeout: patient name, date of birth, surgical site, and procedure verified   Anesthesia: the lesion was anesthetized in a standard fashion   Anesthetic:  1% lidocaine w/ epinephrine 1-100,000 local infiltration Instrument used: flexible razor blade   Hemostasis achieved with: electrodesiccation   Outcome: patient tolerated procedure well   Post-procedure details: wound care instructions given    Destruction of lesion  Destruction method: electrodesiccation and curettage   Informed consent: discussed and consent obtained   Timeout:  patient name, date of birth, surgical site, and  procedure verified Anesthesia: the lesion was anesthetized in a standard fashion   Anesthetic:  1% lidocaine w/ epinephrine 1-100,000 buffered w/ 8.4% NaHCO3 Curettage performed in three different directions: Yes   Electrodesiccation performed over the curetted area: Yes   Curettage cycles:  3 Final wound size (cm):  0.9 Hemostasis achieved with:  electrodesiccation Outcome: patient tolerated procedure well with no complications   Post-procedure details: sterile dressing applied and wound care instructions given   Dressing type: petrolatum   Additional details:  Vaseline and bandage applied.  Specimen 1 - Surgical pathology Differential Diagnosis: r/o BCC  Check Margins: No 0.7 cm excoriated pink papule with arborizing telangiectasia  Basal cell carcinoma (BCC) of skin of left lower extremity including hip left calf  doxycycline (MONODOX) 100 MG capsule Take 1 capsule (100 mg total) by mouth 2 (two) times daily for 7 days. Take with food  Possible contact dermatitis by history to mupirocin  With signs of infection today  Culture today  Plan urgent referral for Mohs to [place] due to area growing (ulcer larger than measured earlier in the month) and being symptomatic for patient.  Start doxycycline monohydrate 100 mg twice daily with food x 7 days.   If not improving in 2 days, patient will advise and will consider changing antibiotic.  Recommend over the counter Bacitracin.   Doxycycline should be taken with food to prevent nausea. Do not lay down for 30 minutes after taking. Be cautious with sun exposure and use good sun protection while on this medication. Pregnant women  should not take this medication.    Related Procedures Anaerobic and Aerobic Culture   Return in about 1 week (around 04/13/2022) for wound check, TBSE.  Graciella Belton, RMA, am acting as scribe for Forest Gleason, MD .  Documentation: I have reviewed the above documentation for accuracy  and completeness, and I agree with the above.  Forest Gleason, MD

## 2022-04-08 ENCOUNTER — Other Ambulatory Visit: Payer: Self-pay

## 2022-04-08 ENCOUNTER — Encounter: Payer: Self-pay | Admitting: Dermatology

## 2022-04-08 ENCOUNTER — Telehealth: Payer: Self-pay

## 2022-04-08 DIAGNOSIS — C44719 Basal cell carcinoma of skin of left lower limb, including hip: Secondary | ICD-10-CM

## 2022-04-08 MED ORDER — SULFAMETHOXAZOLE-TRIMETHOPRIM 800-160 MG PO TABS: 1.0000 | ORAL_TABLET | ORAL | 0 refills | Status: DC

## 2022-04-08 NOTE — Progress Notes (Signed)
Patient advised of information per Dr. Laurence Ferrari and RX sent in. Aw

## 2022-04-08 NOTE — Telephone Encounter (Signed)
Patient advised of information per Dr. Laurence Ferrari and RX sent in. aw

## 2022-04-08 NOTE — Telephone Encounter (Signed)
-----   Message from Alfonso Patten, MD sent at 04/08/2022 12:52 PM EDT ----- Skin , left forearm BASAL CELL CARCINOMA, NODULAR PATTERN, BASE INVOLVED --> already treated in clinic with [facility]  MAs please call. Thank you!

## 2022-04-08 NOTE — Telephone Encounter (Signed)
Recommend d/c doxycycline and switch to Bactrim DS 1 tablet every 12 hours x 7 days since he is not getting improvement. Thank you!

## 2022-04-08 NOTE — Telephone Encounter (Signed)
Patient left nurse voicemail that his wound on the leg still has a odor and hurts. He hopes this antibiotic is working.   MOHs referral was placed. aw

## 2022-04-10 LAB — ANAEROBIC AND AEROBIC CULTURE

## 2022-04-14 ENCOUNTER — Ambulatory Visit: Payer: Medicare HMO | Admitting: Dermatology

## 2022-04-14 ENCOUNTER — Encounter: Payer: Self-pay | Admitting: Dermatology

## 2022-04-14 DIAGNOSIS — L814 Other melanin hyperpigmentation: Secondary | ICD-10-CM

## 2022-04-14 DIAGNOSIS — C44719 Basal cell carcinoma of skin of left lower limb, including hip: Secondary | ICD-10-CM | POA: Diagnosis not present

## 2022-04-14 DIAGNOSIS — B965 Pseudomonas (aeruginosa) (mallei) (pseudomallei) as the cause of diseases classified elsewhere: Secondary | ICD-10-CM | POA: Diagnosis not present

## 2022-04-14 DIAGNOSIS — L0889 Other specified local infections of the skin and subcutaneous tissue: Secondary | ICD-10-CM | POA: Diagnosis not present

## 2022-04-14 DIAGNOSIS — L821 Other seborrheic keratosis: Secondary | ICD-10-CM

## 2022-04-14 DIAGNOSIS — B964 Proteus (mirabilis) (morganii) as the cause of diseases classified elsewhere: Secondary | ICD-10-CM

## 2022-04-14 DIAGNOSIS — L570 Actinic keratosis: Secondary | ICD-10-CM | POA: Diagnosis not present

## 2022-04-14 DIAGNOSIS — Z1283 Encounter for screening for malignant neoplasm of skin: Secondary | ICD-10-CM

## 2022-04-14 DIAGNOSIS — L578 Other skin changes due to chronic exposure to nonionizing radiation: Secondary | ICD-10-CM

## 2022-04-14 DIAGNOSIS — D229 Melanocytic nevi, unspecified: Secondary | ICD-10-CM

## 2022-04-14 DIAGNOSIS — D1800 Hemangioma unspecified site: Secondary | ICD-10-CM

## 2022-04-14 DIAGNOSIS — B952 Enterococcus as the cause of diseases classified elsewhere: Secondary | ICD-10-CM

## 2022-04-14 MED ORDER — GENTAMICIN SULFATE 0.1 % EX OINT: 1.0000 | TOPICAL_OINTMENT | Freq: Every day | CUTANEOUS | 1 refills | Status: DC

## 2022-04-14 NOTE — Progress Notes (Signed)
Follow-Up Visit   Subjective  Derrick Sheppard is a 66 y.o. male who presents for the following: FBSE (The patient presents for Total-Body Skin Exam (TBSE) for skin cancer screening and mole check.  The patient has spots, moles and lesions to be evaluated, some may be new or changing and the patient has concerns that these could be cancer. Patient also here today for wound check. ).  Patient accompanied by wife who contributes to history.  Patient with bx proven BCC at left lower leg, scheduled for Mohs next week. Patient was seen last week in clinic with signs of infection and pain at bx site and was prescribed doxycycline 100 mg twice daily. Patient's rx was switched to Bactrim DS 2 days after starting doxycycline due to odor and discomfort per patient. Patient advises area seems better and not as tender.  The following portions of the chart were reviewed this encounter and updated as appropriate:   Tobacco  Allergies  Meds  Problems  Med Hx  Surg Hx  Fam Hx      Review of Systems:  No other skin or systemic complaints except as noted in HPI or Assessment and Plan.  Objective  Well appearing patient in no apparent distress; mood and affect are within normal limits.  A full examination was performed including scalp, head, eyes, ears, nose, lips, neck, chest, axillae, abdomen, back, buttocks, bilateral upper extremities, bilateral lower extremities, hands, feet, fingers, toes, fingernails, and toenails. All findings within normal limits unless otherwise noted below.  pigmented at L pretibia x 1, hypertrophic at R sup shoulder x 1, R preauricular x 2 (4) Erythematous thin papules/macules with gritty scale.    Assessment & Plan  Basal cell carcinoma (BCC) of skin of left lower extremity including hip left calf  gentamicin ointment (GARAMYCIN) 0.1 % Apply 1 Application topically daily. And cover with bandage  With infection  Culture grew Pseudomonas, Enterococcus  faecalis, and Proteus mirabilis. Patient has been unasedsing neosporin and is s/p course of Bactrim DS.  Today site no longer looks infected. However, start gentamicin ointment to AA once daily and cover with bandage given pseudomonas growth and Mohs scheduled for next week.  Patient scheduled for Mohs next week at [place].  AK (actinic keratosis) (4) pigmented at L pretibia x 1, hypertrophic at R sup shoulder x 1, R preauricular x 2  Actinic keratoses are precancerous spots that appear secondary to cumulative UV radiation exposure/sun exposure over time. They are chronic with expected duration over 1 year. A portion of actinic keratoses will progress to squamous cell carcinoma of the skin. It is not possible to reliably predict which spots will progress to skin cancer and so treatment is recommended to prevent development of skin cancer.  Recommend daily broad spectrum sunscreen SPF 30+ to sun-exposed areas, reapply every 2 hours as needed.  Recommend staying in the shade or wearing long sleeves, sun glasses (UVA+UVB protection) and wide brim hats (4-inch brim around the entire circumference of the hat). Call for new or changing lesions.  Prior to procedure, discussed risks of blister formation, small wound, skin dyspigmentation, or rare scar following cryotherapy. Recommend Vaseline ointment to treated areas while healing.   Destruction of lesion - pigmented at L pretibia x 1, hypertrophic at R sup shoulder x 1, R preauricular x 2  Destruction method: cryotherapy   Informed consent: discussed and consent obtained   Lesion destroyed using liquid nitrogen: Yes   Cryotherapy cycles:  2 Outcome:  patient tolerated procedure well with no complications   Post-procedure details: wound care instructions given     Lentigines - Scattered tan macules - Due to sun exposure - Benign-appearing, observe - Recommend daily broad spectrum sunscreen SPF 30+ to sun-exposed areas, reapply  every 2 hours as needed. - Call for any changes  Seborrheic Keratoses - Stuck-on, waxy, tan-brown papules and/or plaques  - Benign-appearing - Discussed benign etiology and prognosis. - Observe - Call for any changes  Melanocytic Nevi - Tan-brown and/or pink-flesh-colored symmetric macules and papules - Benign appearing on exam today - Observation - Call clinic for new or changing moles - Recommend daily use of broad spectrum spf 30+ sunscreen to sun-exposed areas.   Hemangiomas - Red papules - Discussed benign nature - Observe - Call for any changes  Actinic Damage - Chronic condition, secondary to cumulative UV/sun exposure - diffuse scaly erythematous macules with underlying dyspigmentation - Recommend daily broad spectrum sunscreen SPF 30+ to sun-exposed areas, reapply every 2 hours as needed.  - Staying in the shade or wearing long sleeves, sun glasses (UVA+UVB protection) and wide brim hats (4-inch brim around the entire circumference of the hat) are also recommended for sun protection.  - Call for new or changing lesions.  Skin cancer screening performed today.  Return in about 6 months (around 10/15/2022) for TBSE, AK follow up.  Graciella Belton, RMA, am acting as scribe for Forest Gleason, MD .  Documentation: I have reviewed the above documentation for accuracy and completeness, and I agree with the above.  Forest Gleason, MD

## 2022-04-14 NOTE — Patient Instructions (Signed)
Cryotherapy Aftercare  Wash gently with soap and water everyday.   Apply Vaseline and Band-Aid daily until healed.   Recommend taking Heliocare sun protection supplement daily in sunny weather for additional sun protection. For maximum protection on the sunniest days, you can take up to 2 capsules of regular Heliocare OR take 1 capsule of Heliocare Ultra. For prolonged exposure (such as a full day in the sun), you can repeat your dose of the supplement 4 hours after your first dose. Heliocare can be purchased at [facility], at some [facility] or at www.[email or web address].    Melanoma ABCDEs  Melanoma is the most dangerous type of skin cancer, and is the leading cause of death from skin disease.  You are more likely to develop melanoma if you: Have light-colored skin, light-colored eyes, or red or blond hair Spend a lot of time in the sun Tan regularly, either outdoors or in a tanning bed Have had blistering sunburns, especially during childhood Have a close family member who has had a melanoma Have atypical moles or large birthmarks  Early detection of melanoma is key since treatment is typically straightforward and cure rates are extremely high if we catch it early.   The first sign of melanoma is often a change in a mole or a new dark spot.  The ABCDE system is a way of remembering the signs of melanoma.  A for asymmetry:  The two halves do not match. B for border:  The edges of the growth are irregular. C for color:  A mixture of colors are present instead of an even brown color. D for diameter:  Melanomas are usually (but not always) greater than 6mm - the size of a pencil eraser. E for evolution:  The spot keeps changing in size, shape, and color.  Please check your skin once per month between visits. You can use a small mirror in front and a large mirror behind you to keep an eye on the back side or your body.   If you see any new or changing lesions before your next follow-up,  please call to schedule a visit.  Please continue daily skin protection including broad spectrum sunscreen SPF 30+ to sun-exposed areas, reapplying every 2 hours as needed when you're outdoors.    Due to recent changes in healthcare laws, you may see results of your pathology and/or laboratory studies on MyChart before the doctors have had a chance to review them. We understand that in some cases there may be results that are confusing or concerning to you. Please understand that not all results are received at the same time and often the doctors may need to interpret multiple results in order to provide you with the best plan of care or course of treatment. Therefore, we ask that you please give us 2 business days to thoroughly review all your results before contacting the office for clarification. Should we see a critical lab result, you will be contacted sooner.   If You Need Anything After Your Visit  If you have any questions or concerns for your doctor, please call our main line at [phone] and press option 4 to reach your doctor's medical assistant. If no one answers, please leave a voicemail as directed and we will return your call as soon as possible. Messages left after 4 pm will be answered the following business day.   You may also send us a message via MyChart. We typically respond to MyChart messages within 1-2 business days.    For prescription refills, please ask your pharmacy to contact our office. Our fax number is [phone].  If you have an urgent issue when the clinic is closed that cannot wait until the next business day, you can page your doctor at the number below.    Please note that while we do our best to be available for urgent issues outside of office hours, we are not available 24/7.   If you have an urgent issue and are unable to reach us, you may choose to seek medical care at your doctor's office, retail clinic, urgent care center, or emergency room.  If you  have a medical emergency, please immediately call 911 or go to the emergency department.  Pager Numbers  - Dr. Kowalski: [phone]  - Dr. Moye: [phone]  - Dr. Stewart: [phone]  In the event of inclement weather, please call our main line at [phone] for an update on the status of any delays or closures.  Dermatology Medication Tips: Please keep the boxes that topical medications come in in order to help keep track of the instructions about where and how to use these. Pharmacies typically print the medication instructions only on the boxes and not directly on the medication tubes.   If your medication is too expensive, please contact our office at [phone] option 4 or send us a message through MyChart.   We are unable to tell what your co-pay for medications will be in advance as this is different depending on your insurance coverage. However, we may be able to find a substitute medication at lower cost or fill out paperwork to get insurance to cover a needed medication.   If a prior authorization is required to get your medication covered by your insurance company, please allow us 1-2 business days to complete this process.  Drug prices often vary depending on where the prescription is filled and some pharmacies may offer cheaper prices.  The website www.goodrx.com contains coupons for medications through different pharmacies. The prices here do not account for what the cost may be with help from insurance (it may be cheaper with your insurance), but the website can give you the price if you did not use any insurance.  - You can print the associated coupon and take it with your prescription to the pharmacy.  - You may also stop by our office during regular business hours and pick up a GoodRx coupon card.  - If you need your prescription sent electronically to a different pharmacy, notify our office through [place] MyChart or by phone at [phone] option  4.     Si Usted Necesita Algo Despus de Su Visita  Tambin puede enviarnos un mensaje a travs de MyChart. Por lo general respondemos a los mensajes de MyChart en el transcurso de 1 a 2 das hbiles.  Para renovar recetas, por favor pida a su farmacia que se ponga en contacto con nuestra oficina. Nuestro nmero de fax es el [phone].  Si tiene un asunto urgente cuando la clnica est cerrada y que no puede esperar hasta el siguiente da hbil, puede llamar/localizar a su doctor(a) al nmero que aparece a continuacin.   Por favor, tenga en cuenta que aunque hacemos todo lo posible para estar disponibles para asuntos urgentes fuera del horario de oficina, no estamos disponibles las 24 horas del da, los 7 das de la semana.   Si tiene un problema urgente y no puede comunicarse con nosotros, puede optar por buscar atencin mdica  en   el consultorio de su doctor(a), en una clnica privada, en un centro de atencin urgente o en una sala de emergencias.  Si tiene una emergencia mdica, por favor llame inmediatamente al 911 o vaya a la sala de emergencias.  Nmeros de bper  - Dr. Kowalski: [phone]  - Dra. Moye: [phone]  - Dra. Stewart: [phone]  En caso de inclemencias del tiempo, por favor llame a nuestra lnea principal al [phone] para una actualizacin sobre el estado de cualquier retraso o cierre.  Consejos para la medicacin en dermatologa: Por favor, guarde las cajas en las que vienen los medicamentos de uso tpico para ayudarle a seguir las instrucciones sobre dnde y cmo usarlos. Las farmacias generalmente imprimen las instrucciones del medicamento slo en las cajas y no directamente en los tubos del medicamento.   Si su medicamento es muy caro, por favor, pngase en contacto con nuestra oficina llamando al [phone] y presione la opcin 4 o envenos un mensaje a travs de MyChart.   No podemos decirle cul ser su copago por los medicamentos por  adelantado ya que esto es diferente dependiendo de la cobertura de su seguro. Sin embargo, es posible que podamos encontrar un medicamento sustituto a menor costo o llenar un formulario para que el seguro cubra el medicamento que se considera necesario.   Si se requiere una autorizacin previa para que su compaa de seguros cubra su medicamento, por favor permtanos de 1 a 2 das hbiles para completar este proceso.  Los precios de los medicamentos varan con frecuencia dependiendo del lugar de dnde se surte la receta y alguna farmacias pueden ofrecer precios ms baratos.  El sitio web www.goodrx.com tiene cupones para medicamentos de diferentes farmacias. Los precios aqu no tienen en cuenta lo que podra costar con la ayuda del seguro (puede ser ms barato con su seguro), pero el sitio web puede darle el precio si no utiliz ningn seguro.  - Puede imprimir el cupn correspondiente y llevarlo con su receta a la farmacia.  - Tambin puede pasar por nuestra oficina durante el horario de atencin regular y recoger una tarjeta de cupones de GoodRx.  - Si necesita que su receta se enve electrnicamente a una farmacia diferente, informe a nuestra oficina a travs de MyChart de [facility] o por telfono llamando al [phone] y presione la opcin 4.  

## 2022-04-29 ENCOUNTER — Ambulatory Visit: Payer: Medicare HMO | Admitting: Dermatology

## 2022-05-25 ENCOUNTER — Telehealth: Payer: Self-pay

## 2022-05-25 NOTE — Telephone Encounter (Signed)
History and specimen tracking updated per Mohs progress notes

## 2022-06-08 ENCOUNTER — Encounter: Payer: Medicare HMO | Admitting: Dermatology

## 2022-09-08 ENCOUNTER — Ambulatory Visit: Payer: Medicare HMO | Admitting: Dermatology

## 2022-09-08 ENCOUNTER — Encounter: Payer: Self-pay | Admitting: Dermatology

## 2022-09-08 DIAGNOSIS — L308 Other specified dermatitis: Secondary | ICD-10-CM | POA: Diagnosis not present

## 2022-09-08 DIAGNOSIS — D492 Neoplasm of unspecified behavior of bone, soft tissue, and skin: Secondary | ICD-10-CM | POA: Diagnosis not present

## 2022-09-08 DIAGNOSIS — Z1283 Encounter for screening for malignant neoplasm of skin: Secondary | ICD-10-CM | POA: Diagnosis not present

## 2022-09-08 DIAGNOSIS — L814 Other melanin hyperpigmentation: Secondary | ICD-10-CM

## 2022-09-08 DIAGNOSIS — L821 Other seborrheic keratosis: Secondary | ICD-10-CM

## 2022-09-08 DIAGNOSIS — L570 Actinic keratosis: Secondary | ICD-10-CM

## 2022-09-08 DIAGNOSIS — Z85828 Personal history of other malignant neoplasm of skin: Secondary | ICD-10-CM

## 2022-09-08 DIAGNOSIS — D2372 Other benign neoplasm of skin of left lower limb, including hip: Secondary | ICD-10-CM

## 2022-09-08 DIAGNOSIS — D229 Melanocytic nevi, unspecified: Secondary | ICD-10-CM

## 2022-09-08 DIAGNOSIS — L578 Other skin changes due to chronic exposure to nonionizing radiation: Secondary | ICD-10-CM

## 2022-09-08 MED ORDER — TRIAMCINOLONE ACETONIDE 0.1 % EX OINT: TOPICAL_OINTMENT | CUTANEOUS | 1 refills | Status: DC

## 2022-09-08 NOTE — Progress Notes (Signed)
Follow-Up Visit   Subjective  Derrick Sheppard is a 66 y.o. male who presents for the following: FBSE (Hx BCC).  The patient presents for Total-Body Skin Exam (TBSE) for skin cancer screening and mole check.  The patient has spots, moles and lesions to be evaluated, some may be new or changing and the patient has concerns that these could be cancer.  Patient accompanied by wife who contributes to history.   The following portions of the chart were reviewed this encounter and updated as appropriate:   Tobacco  Allergies  Meds  Problems  Med Hx  Surg Hx  Fam Hx      Review of Systems:  No other skin or systemic complaints except as noted in HPI or Assessment and Plan.  Objective  Well appearing patient in no apparent distress; mood and affect are within normal limits.  A full examination was performed including scalp, head, eyes, ears, nose, lips, neck, chest, axillae, abdomen, back, buttocks, bilateral upper extremities, bilateral lower extremities, hands, feet, fingers, toes, fingernails, and toenails. All findings within normal limits unless otherwise noted below.  R upper arm x 1, R forearm x 1, R elbow x 2, L forearm x 2, R temple x 1, L flank x 1 (8) Erythematous thin papules/macules with gritty scale.   left tongue Superficial erosion     legs Scaly pink papules coalescing to plaques     Assessment & Plan  AK (actinic keratosis) (8) R upper arm x 1, R forearm x 1, R elbow x 2, L forearm x 2, R temple x 1, L flank x 1  Actinic keratoses are precancerous spots that appear secondary to cumulative UV radiation exposure/sun exposure over time. They are chronic with expected duration over 1 year. A portion of actinic keratoses will progress to squamous cell carcinoma of the skin. It is not possible to reliably predict which spots will progress to skin cancer and so treatment is recommended to prevent development of skin cancer.  Recommend daily broad spectrum  sunscreen SPF 30+ to sun-exposed areas, reapply every 2 hours as needed.  Recommend staying in the shade or wearing long sleeves, sun glasses (UVA+UVB protection) and wide brim hats (4-inch brim around the entire circumference of the hat). Call for new or changing lesions.  Prior to procedure, discussed risks of blister formation, small wound, skin dyspigmentation, or rare scar following cryotherapy. Recommend Vaseline ointment to treated areas while healing.   Destruction of lesion - R upper arm x 1, R forearm x 1, R elbow x 2, L forearm x 2, R temple x 1, L flank x 1  Destruction method: cryotherapy   Informed consent: discussed and consent obtained   Lesion destroyed using liquid nitrogen: Yes   Cryotherapy cycles:  2 Outcome: patient tolerated procedure well with no complications   Post-procedure details: wound care instructions given    Neoplasm of skin left tongue  Monitor, recheck on follow up in 6 weeks. Call for worsening.  Other eczema legs  Start TMC 0.1% ointment twice a day up to 2 weeks. Avoid applying to face, groin, and axilla. Use as directed. Long-term use can cause thinning of the skin.  Topical steroids (such as triamcinolone, fluocinolone, fluocinonide, mometasone, clobetasol, halobetasol, betamethasone, hydrocortisone) can cause thinning and lightening of the skin if they are used for too long in the same area. Your physician has selected the right strength medicine for your problem and area affected on the body. Please use your medication  only as directed by your physician to prevent side effects.    triamcinolone ointment (KENALOG) 0.1 % - legs Apply to legs twice daily for up to 2 weeks as needed. Avoid applying to face, groin, and axilla. Use as directed. Long-term use can cause thinning of the skin.   History of Basal Cell Carcinoma of the Skin - No evidence of recurrence today - Recommend regular full body skin exams - Recommend daily broad spectrum  sunscreen SPF 30+ to sun-exposed areas, reapply every 2 hours as needed.  - Call if any new or changing lesions are noted between office visits  Lentigines - Scattered tan macules - Due to sun exposure - Benign-appearing, observe - Recommend daily broad spectrum sunscreen SPF 30+ to sun-exposed areas, reapply every 2 hours as needed. - Call for any changes  Seborrheic Keratoses - Stuck-on, waxy, tan-brown papules and/or plaques  - Benign-appearing - Discussed benign etiology and prognosis. - Observe - Call for any changes  Melanocytic Nevi - Tan-brown and/or pink-flesh-colored symmetric macules and papules - Benign appearing on exam today - Observation - Call clinic for new or changing moles - Recommend daily use of broad spectrum spf 30+ sunscreen to sun-exposed areas.   Hemangiomas - Red papules - Discussed benign nature - Observe - Call for any changes  Actinic Damage - Chronic condition, secondary to cumulative UV/sun exposure - diffuse scaly erythematous macules with underlying dyspigmentation - Recommend daily broad spectrum sunscreen SPF 30+ to sun-exposed areas, reapply every 2 hours as needed.  - Staying in the shade or wearing long sleeves, sun glasses (UVA+UVB protection) and wide brim hats (4-inch brim around the entire circumference of the hat) are also recommended for sun protection.  - Call for new or changing lesions.  Skin cancer screening performed today.  Dermatofibroma - Firm pink/brown papulenodule with dimple sign at left pretibia - Benign appearing - Call for any changes  Return in about 6 weeks (around 10/20/2022) for recheck tongue, AK follow up.  Graciella Belton, RMA, am acting as scribe for Forest Gleason, MD .  Documentation: I have reviewed the above documentation for accuracy and completeness, and I agree with the above.  Forest Gleason, MD

## 2022-09-08 NOTE — Patient Instructions (Addendum)
Recommend OTC [place] Rapid Relief Anti-Itch cream (pramoxine + menthol), CeraVe Anti-itch cream or lotion (pramoxine), Sarna lotion (Original- menthol + camphor or Sensitive- pramoxine) or Eucerin 12 hour Itch Relief lotion (menthol) up to 3 times per day to areas on body that are itchy.  Gentle Skin Care Guide  1. Bathe no more than once a day.  2. Avoid bathing in hot water  3. Use a mild soap like Dove, Vanicream, Cetaphil, CeraVe. Can use Lever 2000 or Cetaphil antibacterial soap  4. Use soap only where you need it. On most days, use it under your arms, between your legs, and on your feet. Let the water rinse other areas unless visibly dirty.  5. When you get out of the bath/shower, use a towel to gently blot your skin dry, don't rub it.  6. While your skin is still a little damp, apply a moisturizing cream such as Vanicream, CeraVe, Cetaphil, Eucerin, Sarna lotion or plain Vaseline Jelly. For hands apply Neutrogena [place] Hand Cream or Excipial Hand Cream.  7. Reapply moisturizer any time you start to itch or feel dry.  8. Sometimes using free and clear laundry detergents can be helpful. Fabric softener sheets should be avoided. Downy Free & Gentle liquid, or any liquid fabric softener that is free of dyes and perfumes, it acceptable to use  9. If your doctor has given you prescription creams you may apply moisturizers over them   Melanoma ABCDEs  Melanoma is the most dangerous type of skin cancer, and is the leading cause of death from skin disease.  You are more likely to develop melanoma if you: Have light-colored skin, light-colored eyes, or red or blond hair Spend a lot of time in the sun Tan regularly, either outdoors or in a tanning bed Have had blistering sunburns, especially during childhood Have a close family member who has had a melanoma Have atypical moles or large birthmarks  Early detection of melanoma is key since treatment is typically straightforward and  cure rates are extremely high if we catch it early.   The first sign of melanoma is often a change in a mole or a new dark spot.  The ABCDE system is a way of remembering the signs of melanoma.  A for asymmetry:  The two halves do not match. B for border:  The edges of the growth are irregular. C for color:  A mixture of colors are present instead of an even brown color. D for diameter:  Melanomas are usually (but not always) greater than 13m - the size of a pencil eraser. E for evolution:  The spot keeps changing in size, shape, and color.  Please check your skin once per month between visits. You can use a small mirror in front and a large mirror behind you to keep an eye on the back side or your body.   If you see any new or changing lesions before your next follow-up, please call to schedule a visit.  Please continue daily skin protection including broad spectrum sunscreen SPF 30+ to sun-exposed areas, reapplying every 2 hours as needed when you're outdoors.    Due to recent changes in healthcare laws, you may see results of your pathology and/or laboratory studies on MyChart before the doctors have had a chance to review them. We understand that in some cases there may be results that are confusing or concerning to you. Please understand that not all results are received at the same time and often the  doctors may need to interpret multiple results in order to provide you with the best plan of care or course of treatment. Therefore, we ask that you please give [place] 2 business days to thoroughly review all your results before contacting the office for clarification. Should we see a critical lab result, you will be contacted sooner.   If You Need Anything After Your Visit  If you have any questions or concerns for your doctor, please call our main line at [phone] and press option 4 to reach your doctor's medical assistant. If no one answers, please leave a voicemail as directed and we will  return your call as soon as possible. Messages left after 4 pm will be answered the following business day.   You may also send [place] a message via [place]. We typically respond to MyChart messages within 1-2 business days.  For prescription refills, please ask your pharmacy to contact our office. Our fax number is [phone].  If you have an urgent issue when the clinic is closed that cannot wait until the next business day, you can page your doctor at the number below.    Please note that while we do our best to be available for urgent issues outside of office hours, we are not available 24/7.   If you have an urgent issue and are unable to reach [place], you may choose to seek medical care at your doctor's office, retail clinic, urgent care center, or emergency room.  If you have a medical emergency, please immediately call 911 or go to the emergency department.  Pager Numbers  - Dr. Nehemiah Massed: [phone]  - Dr. Laurence Ferrari: [phone]  - Dr. Nicole Kindred: [phone]  In the event of inclement weather, please call our main line at [phone] for an update on the status of any delays or closures.  Dermatology Medication Tips: Please keep the boxes that topical medications come in in order to help keep track of the instructions about where and how to use these. Pharmacies typically print the medication instructions only on the boxes and not directly on the medication tubes.   If your medication is too expensive, please contact our office at [phone] option 4 or send [place] a message through [place].   We are unable to tell what your co-pay for medications will be in advance as this is different depending on your insurance coverage. However, we may be able to find a substitute medication at lower cost or fill out paperwork to get insurance to cover a needed medication.   If a prior authorization is required to get your medication covered by your insurance company, please allow [place] 1-2 business  days to complete this process.  Drug prices often vary depending on where the prescription is filled and some pharmacies may offer cheaper prices.  The website www.goodrx.com contains coupons for medications through different pharmacies. The prices here do not account for what the cost may be with help from insurance (it may be cheaper with your insurance), but the website can give you the price if you did not use any insurance.  - You can print the associated coupon and take it with your prescription to the pharmacy.  - You may also stop by our office during regular business hours and pick up a GoodRx coupon card.  - If you need your prescription sent electronically to a different pharmacy, notify our office through [facility] or by phone at [phone] option 4.     Si Usted [employer]  Despus de Su Visita  Tambin puede enviarnos un mensaje a travs de MyChart. Por lo general respondemos a los mensajes de MyChart en el transcurso de 1 a 2 das hbiles.  Para renovar recetas, por favor pida a su farmacia que se ponga en contacto con nuestra oficina. Harland Dingwall de fax es [place] [phone].  Si tiene un asunto urgente cuando la clnica est cerrada y que no puede esperar hasta el siguiente da hbil, puede llamar/localizar a su doctor(a) al nmero que aparece a continuacin.   Por favor, tenga en cuenta que aunque hacemos todo lo posible para estar disponibles para asuntos urgentes fuera del horario de [place], no estamos disponibles las 24 horas del da, los 7 das de la [place].   Si tiene un problema urgente y no puede comunicarse con nosotros, puede optar por buscar atencin mdica  en el consultorio de su doctor(a), en una clnica privada, en un centro de atencin urgente o en una sala de emergencias.  Si tiene Engineering geologist, por favor llame inmediatamente al 911 o vaya a la sala de emergencias.  Nmeros de bper  - Dr. Nehemiah Massed: [phone]  - Dra. Moye:  [phone]  - Dra. Nicole Kindred: [phone]  En caso de inclemencias del [place], por favor llame a Johnsie Kindred principal al [phone] para una actualizacin sobre el [place] de cualquier retraso o cierre.  Consejos para la medicacin en dermatologa: Por favor, guarde las cajas en las que vienen los medicamentos de uso tpico para ayudarle a seguir las instrucciones sobre dnde y cmo usarlos. Las farmacias generalmente imprimen las instrucciones del medicamento slo en las cajas y no directamente en los tubos del [place].   Si su medicamento es muy caro, por favor, pngase en contacto con Zigmund Daniel llamando al [phone] y presione la opcin 4 o envenos un mensaje a travs de Pharmacist, community.   No podemos decirle cul ser su copago por los medicamentos por adelantado ya que esto es diferente dependiendo de la cobertura de su seguro. Sin embargo, es posible que podamos encontrar un medicamento sustituto a Electrical engineer un formulario para que el seguro cubra el medicamento que se considera necesario.   Si se requiere una autorizacin previa para que su compaa de seguros [place] su medicamento, por favor permtanos de 1 a 2 das hbiles para completar este proceso.  Los precios de los medicamentos varan con frecuencia dependiendo del Environmental consultant de dnde se surte la receta y alguna farmacias pueden ofrecer precios ms baratos.  El sitio web www.goodrx.com tiene cupones para medicamentos de Airline pilot. Los precios aqu no tienen en cuenta lo que podra costar con la ayuda del seguro (puede ser ms barato con su seguro), pero el sitio web puede darle el precio si no utiliz Research scientist (physical sciences).  - Puede imprimir el cupn correspondiente y llevarlo con su receta a la farmacia.  - Tambin puede pasar por nuestra oficina durante el horario de atencin regular y Charity fundraiser una tarjeta de cupones de GoodRx.  - Si necesita que su receta se enve electrnicamente a una farmacia diferente,  informe a nuestra oficina a travs de MyChart de [place] o por telfono llamando al [phone] y presione la opcin 4.

## 2022-10-18 ENCOUNTER — Ambulatory Visit: Payer: Medicare HMO | Admitting: Dermatology

## 2022-10-26 ENCOUNTER — Ambulatory Visit: Payer: Medicare HMO | Admitting: Dermatology

## 2022-12-02 ENCOUNTER — Ambulatory Visit: Payer: Medicare HMO | Admitting: Dermatology

## 2022-12-02 VITALS — BP 120/65 | HR 71

## 2022-12-02 DIAGNOSIS — L570 Actinic keratosis: Secondary | ICD-10-CM | POA: Diagnosis not present

## 2022-12-02 DIAGNOSIS — L720 Epidermal cyst: Secondary | ICD-10-CM | POA: Diagnosis not present

## 2022-12-02 DIAGNOSIS — D485 Neoplasm of uncertain behavior of skin: Secondary | ICD-10-CM

## 2022-12-02 DIAGNOSIS — L209 Atopic dermatitis, unspecified: Secondary | ICD-10-CM

## 2022-12-02 DIAGNOSIS — L578 Other skin changes due to chronic exposure to nonionizing radiation: Secondary | ICD-10-CM | POA: Diagnosis not present

## 2022-12-02 DIAGNOSIS — Z85828 Personal history of other malignant neoplasm of skin: Secondary | ICD-10-CM

## 2022-12-02 DIAGNOSIS — L308 Other specified dermatitis: Secondary | ICD-10-CM

## 2022-12-02 NOTE — Progress Notes (Signed)
Follow-Up Visit   Subjective  Derrick Sheppard is a 67 y.o. male who presents for the following: Actinic Keratosis (Recheck previously treated precancerous skin lesions on the face, arms, and hands) and Recheck lesion on the tongue (Patient does still have tenderness at the L tongue). The patient has spots, moles and lesions to be evaluated, some may be new or changing.  The following portions of the chart were reviewed this encounter and updated as appropriate:   Tobacco  Allergies  Meds  Problems  Med Hx  Surg Hx  Fam Hx      Review of Systems:  No other skin or systemic complaints except as noted in HPI or Assessment and Plan.  Objective  Well appearing patient in no apparent distress; mood and affect are within normal limits.  A focused examination was performed including the face, arms, hands, side, and tongue. Relevant physical exam findings are noted in the Assessment and Plan.  L tongue Erosions L tongue, R tongue clear.     R preauricular x 1, L forearm x 3, L upper back x 1 (5) Erythematous thin papules/macules with thicker gritty scale.   L zygoma Subcutaneous nodule.   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 They are chronic with expected duration over 1 year. A portion of actinic keratoses will progress to squamous cell carcinoma of the skin. It is not possible to reliably predict which spots will progress to skin cancer and so treatment is recommended to prevent development of skin cancer.  Recommend  daily broad spectrum sunscreen SPF 30+ to sun-exposed areas, reapply every 2 hours as needed.  Recommend staying in the shade or wearing long sleeves, sun glasses (UVA+UVB protection) and wide brim hats (4-inch brim around the entire circumference of the hat). Call for new or changing lesions.  Prior to procedure, discussed risks of blister formation, small wound, skin dyspigmentation, or rare scar following cryotherapy. Recommend Vaseline ointment to treated areas while healing.   Destruction of lesion - R preauricular x 1, L forearm x 3, L upper back x 1 Complexity: simple   Destruction method: cryotherapy   Informed consent: discussed and consent obtained   Timeout:  patient name, date of birth, surgical site, and procedure verified Lesion destroyed using liquid nitrogen: Yes   Region frozen until ice ball extended beyond lesion: Yes   Outcome: patient tolerated procedure well with no complications   Post-procedure details: wound care instructions given    Epidermal inclusion cyst L zygoma  Benign-appearing. Exam most consistent with an epidermal inclusion cyst. Discussed that a cyst is a benign growth that can grow over time and sometimes get irritated or inflamed. Recommend observation if it is not bothersome. Discussed option of surgical excision to remove it if it is growing, symptomatic, or other changes noted. Please call for new or changing lesions so they can be evaluated.    Other eczema B/L lower leg  Continue TMC 0.1% ointment to aa's QD-BID PRN as needed up to 2 weeks. Topical  steroids (such as triamcinolone, fluocinolone, fluocinonide, mometasone, clobetasol, halobetasol, betamethasone, hydrocortisone) can cause thinning and lightening of the skin if they are used for too long in the same area. Your physician has selected the right strength medicine for your problem and area affected on the body. Please use your medication only as directed by your physician to prevent side  effects.    Related Medications triamcinolone ointment (KENALOG) 0.1 % Apply to legs twice daily for up to 2 weeks as needed. Avoid applying to face, groin, and axilla. Use as directed. Long-term use can cause thinning of the skin.  Actinic Damage - chronic, secondary to cumulative UV radiation exposure/sun exposure over time - diffuse scaly erythematous macules with underlying dyspigmentation - Recommend daily broad spectrum sunscreen SPF 30+ to sun-exposed areas, reapply every 2 hours as needed.  - Recommend staying in the shade or wearing long sleeves, sun glasses (UVA+UVB protection) and wide brim hats (4-inch brim around the entire circumference of the hat). - Call for new or changing lesions.  Seborrheic Keratoses - Stuck-on, waxy, tan-brown papules and/or plaques  - Benign-appearing - Discussed benign etiology and prognosis. - Observe - Call for any changes  History of Basal Cell Carcinoma of the Skin - L forearm - No evidence of recurrence today - Recommend regular full body skin exams - Recommend daily broad spectrum sunscreen SPF 30+ to sun-exposed areas, reapply every 2 hours as needed.  - Call if any new or changing lesions are noted between office visits  Return in about 3 months (around 03/02/2023) for TBSE.  Luther Redo, CMA, am acting as scribe for Forest Gleason, MD .  Documentation: I have reviewed the above documentation for accuracy and completeness, and I agree with the above.  Forest Gleason, MD

## 2022-12-02 NOTE — Patient Instructions (Signed)
Due to recent changes in healthcare laws, you may see results of your pathology and/or laboratory studies on MyChart before the doctors have had a chance to review them. We understand that in some cases there may be results that are confusing or concerning to you. Please understand that not all results are received at the same time and often the doctors may need to interpret multiple results in order to provide you with the best plan of care or course of treatment. Therefore, we ask that you please give us 2 business days to thoroughly review all your results before contacting the office for clarification. Should we see a critical lab result, you will be contacted sooner.   If You Need Anything After Your Visit  If you have any questions or concerns for your doctor, please call our main line at [phone] and press option 4 to reach your doctor's medical assistant. If no one answers, please leave a voicemail as directed and we will return your call as soon as possible. Messages left after 4 pm will be answered the following business day.   You may also send us a message via MyChart. We typically respond to MyChart messages within 1-2 business days.  For prescription refills, please ask your pharmacy to contact our office. Our fax number is [phone].  If you have an urgent issue when the clinic is closed that cannot wait until the next business day, you can page your doctor at the number below.    Please note that while we do our best to be available for urgent issues outside of office hours, we are not available 24/7.   If you have an urgent issue and are unable to reach us, you may choose to seek medical care at your doctor's office, retail clinic, urgent care center, or emergency room.  If you have a medical emergency, please immediately call 911 or go to the emergency department.  Pager Numbers  - Dr. Kowalski: [phone]  - Dr. Moye: [phone]  - Dr. Stewart:  [phone]  In the event of inclement weather, please call our main line at [phone] for an update on the status of any delays or closures.  Dermatology Medication Tips: Please keep the boxes that topical medications come in in order to help keep track of the instructions about where and how to use these. Pharmacies typically print the medication instructions only on the boxes and not directly on the medication tubes.   If your medication is too expensive, please contact our office at [phone] option 4 or send us a message through MyChart.   We are unable to tell what your co-pay for medications will be in advance as this is different depending on your insurance coverage. However, we may be able to find a substitute medication at lower cost or fill out paperwork to get insurance to cover a needed medication.   If a prior authorization is required to get your medication covered by your insurance company, please allow us 1-2 business days to complete this process.  Drug prices often vary depending on where the prescription is filled and some pharmacies may offer cheaper prices.  The website www.goodrx.com contains coupons for medications through different pharmacies. The prices here do not account for what the cost may be with help from insurance (it may be cheaper with your insurance), but the website can give you the price if you did not use any insurance.  - You can print the associated coupon and take it with   your prescription to the pharmacy.  - You may also stop by our office during regular business hours and pick up a GoodRx coupon card.  - If you need your prescription sent electronically to a different pharmacy, notify our office through [place] MyChart or by phone at [phone] option 4.     Si Usted Necesita Algo Despus de Su Visita  Tambin puede enviarnos un mensaje a travs de MyChart. Por lo general respondemos a los mensajes de MyChart en el transcurso de 1 a 2  das hbiles.  Para renovar recetas, por favor pida a su farmacia que se ponga en contacto con nuestra oficina. Nuestro nmero de fax es el [phone].  Si tiene un asunto urgente cuando la clnica est cerrada y que no puede esperar hasta el siguiente da hbil, puede llamar/localizar a su doctor(a) al nmero que aparece a continuacin.   Por favor, tenga en cuenta que aunque hacemos todo lo posible para estar disponibles para asuntos urgentes fuera del horario de oficina, no estamos disponibles las 24 horas del da, los 7 das de la semana.   Si tiene un problema urgente y no puede comunicarse con nosotros, puede optar por buscar atencin mdica  en el consultorio de su doctor(a), en una clnica privada, en un centro de atencin urgente o en una sala de emergencias.  Si tiene una emergencia mdica, por favor llame inmediatamente al 911 o vaya a la sala de emergencias.  Nmeros de bper  - Dr. Kowalski: [phone]  - Dra. Moye: [phone]  - Dra. Stewart: [phone]  En caso de inclemencias del tiempo, por favor llame a nuestra lnea principal al [phone] para una actualizacin sobre el estado de cualquier retraso o cierre.  Consejos para la medicacin en dermatologa: Por favor, guarde las cajas en las que vienen los medicamentos de uso tpico para ayudarle a seguir las instrucciones sobre dnde y cmo usarlos. Las farmacias generalmente imprimen las instrucciones del medicamento slo en las cajas y no directamente en los tubos del medicamento.   Si su medicamento es muy caro, por favor, pngase en contacto con nuestra oficina llamando al [phone] y presione la opcin 4 o envenos un mensaje a travs de MyChart.   No podemos decirle cul ser su copago por los medicamentos por adelantado ya que esto es diferente dependiendo de la cobertura de su seguro. Sin embargo, es posible que podamos encontrar un medicamento sustituto a menor costo o llenar un formulario para que el  seguro cubra el medicamento que se considera necesario.   Si se requiere una autorizacin previa para que su compaa de seguros cubra su medicamento, por favor permtanos de 1 a 2 das hbiles para completar este proceso.  Los precios de los medicamentos varan con frecuencia dependiendo del lugar de dnde se surte la receta y alguna farmacias pueden ofrecer precios ms baratos.  El sitio web www.goodrx.com tiene cupones para medicamentos de diferentes farmacias. Los precios aqu no tienen en cuenta lo que podra costar con la ayuda del seguro (puede ser ms barato con su seguro), pero el sitio web puede darle el precio si no utiliz ningn seguro.  - Puede imprimir el cupn correspondiente y llevarlo con su receta a la farmacia.  - Tambin puede pasar por nuestra oficina durante el horario de atencin regular y recoger una tarjeta de cupones de GoodRx.  - Si necesita que su receta se enve electrnicamente a una farmacia diferente, informe a nuestra oficina a travs de MyChart de [facility]   o por telfono llamando al [phone] y presione la opcin 4.  

## 2022-12-03 ENCOUNTER — Encounter: Payer: Self-pay | Admitting: Dermatology

## 2022-12-07 ENCOUNTER — Telehealth: Payer: Self-pay

## 2022-12-07 NOTE — Telephone Encounter (Signed)
Referral sent to [facility] in [place] [phone]).

## 2022-12-07 NOTE — Telephone Encounter (Signed)
Please check if [facility] could see him or if [facility] could see him and biopsy the tongue if indicated. Thank you!

## 2022-12-07 NOTE — Telephone Encounter (Signed)
[facility] called declining referral. They can only accept referrals from other dental offices.

## 2022-12-13 ENCOUNTER — Other Ambulatory Visit: Payer: Self-pay | Admitting: Internal Medicine

## 2022-12-13 DIAGNOSIS — R911 Solitary pulmonary nodule: Secondary | ICD-10-CM

## 2022-12-13 DIAGNOSIS — I10 Essential (primary) hypertension: Secondary | ICD-10-CM

## 2022-12-20 ENCOUNTER — Ambulatory Visit: Payer: Medicare HMO | Admitting: Registered Nurse

## 2022-12-20 ENCOUNTER — Encounter
Admission: RE | Disposition: A | Discharge status: Home / Self Care | Payer: Self-pay | Source: Home / Self Care | Attending: Gastroenterology

## 2022-12-20 ENCOUNTER — Ambulatory Visit
Admission: RE | Admit: 2022-12-20 | Discharge: 2022-12-20 | Disposition: A | Discharge status: Home / Self Care | Payer: Medicare HMO | Attending: Gastroenterology | Admitting: Gastroenterology

## 2022-12-20 DIAGNOSIS — G4730 Sleep apnea, unspecified: Secondary | ICD-10-CM | POA: Diagnosis not present

## 2022-12-20 DIAGNOSIS — D509 Iron deficiency anemia, unspecified: Secondary | ICD-10-CM | POA: Insufficient documentation

## 2022-12-20 DIAGNOSIS — I2510 Atherosclerotic heart disease of native coronary artery without angina pectoris: Secondary | ICD-10-CM | POA: Insufficient documentation

## 2022-12-20 DIAGNOSIS — I10 Essential (primary) hypertension: Secondary | ICD-10-CM | POA: Diagnosis not present

## 2022-12-20 DIAGNOSIS — Z87891 Personal history of nicotine dependence: Secondary | ICD-10-CM | POA: Diagnosis not present

## 2022-12-20 DIAGNOSIS — Z7901 Long term (current) use of anticoagulants: Secondary | ICD-10-CM | POA: Insufficient documentation

## 2022-12-20 DIAGNOSIS — E669 Obesity, unspecified: Secondary | ICD-10-CM | POA: Diagnosis not present

## 2022-12-20 DIAGNOSIS — Z6827 Body mass index (BMI) 27.0-27.9, adult: Secondary | ICD-10-CM | POA: Insufficient documentation

## 2022-12-20 DIAGNOSIS — K5730 Diverticulosis of large intestine without perforation or abscess without bleeding: Secondary | ICD-10-CM | POA: Insufficient documentation

## 2022-12-20 DIAGNOSIS — D122 Benign neoplasm of ascending colon: Secondary | ICD-10-CM | POA: Insufficient documentation

## 2022-12-20 DIAGNOSIS — D120 Benign neoplasm of cecum: Secondary | ICD-10-CM | POA: Diagnosis not present

## 2022-12-20 DIAGNOSIS — K2950 Unspecified chronic gastritis without bleeding: Secondary | ICD-10-CM | POA: Insufficient documentation

## 2022-12-20 DIAGNOSIS — Z87442 Personal history of urinary calculi: Secondary | ICD-10-CM | POA: Diagnosis not present

## 2022-12-20 DIAGNOSIS — Z800 Family history of malignant neoplasm of digestive organs: Secondary | ICD-10-CM | POA: Insufficient documentation

## 2022-12-20 DIAGNOSIS — D123 Benign neoplasm of transverse colon: Secondary | ICD-10-CM | POA: Diagnosis not present

## 2022-12-20 DIAGNOSIS — D125 Benign neoplasm of sigmoid colon: Secondary | ICD-10-CM | POA: Diagnosis not present

## 2022-12-20 DIAGNOSIS — K219 Gastro-esophageal reflux disease without esophagitis: Secondary | ICD-10-CM | POA: Insufficient documentation

## 2022-12-20 DIAGNOSIS — J4489 Other specified chronic obstructive pulmonary disease: Secondary | ICD-10-CM | POA: Insufficient documentation

## 2022-12-20 DIAGNOSIS — D124 Benign neoplasm of descending colon: Secondary | ICD-10-CM | POA: Diagnosis not present

## 2022-12-20 DIAGNOSIS — I4891 Unspecified atrial fibrillation: Secondary | ICD-10-CM | POA: Diagnosis not present

## 2022-12-20 HISTORY — PX: COLONOSCOPY WITH PROPOFOL: SHX5780

## 2022-12-20 HISTORY — PX: ESOPHAGOGASTRODUODENOSCOPY (EGD) WITH PROPOFOL: SHX5813

## 2022-12-20 SURGERY — COLONOSCOPY WITH PROPOFOL
Anesthesia: General

## 2022-12-20 MED ORDER — LIDOCAINE HCL (CARDIAC) PF 100 MG/5ML IV SOSY
PREFILLED_SYRINGE | INTRAVENOUS | Status: DC | PRN
  Administered 2022-12-20: 60 mg via INTRAVENOUS

## 2022-12-20 MED ORDER — PROPOFOL 10 MG/ML IV BOLUS
INTRAVENOUS | Status: DC | PRN
  Administered 2022-12-20: 60 mg via INTRAVENOUS

## 2022-12-20 MED ORDER — EPHEDRINE SULFATE (PRESSORS) 50 MG/ML IJ SOLN
INTRAMUSCULAR | Status: DC | PRN
  Administered 2022-12-20: 5 mg via INTRAVENOUS

## 2022-12-20 MED ORDER — PROPOFOL 500 MG/50ML IV EMUL
INTRAVENOUS | Status: DC | PRN
  Administered 2022-12-20: 140 ug/kg/min via INTRAVENOUS

## 2022-12-20 MED ORDER — DEXMEDETOMIDINE HCL IN NACL 80 MCG/20ML IV SOLN
INTRAVENOUS | Status: DC | PRN
  Administered 2022-12-20: 8 ug via INTRAVENOUS

## 2022-12-20 MED ORDER — PHENYLEPHRINE HCL (PRESSORS) 10 MG/ML IV SOLN
INTRAVENOUS | Status: DC | PRN
  Administered 2022-12-20 (×2): 80 ug via INTRAVENOUS
  Administered 2022-12-20: 160 ug via INTRAVENOUS
  Administered 2022-12-20: 80 ug via INTRAVENOUS
  Administered 2022-12-20: 160 ug via INTRAVENOUS

## 2022-12-20 MED ORDER — STERILE WATER FOR IRRIGATION IR SOLN
Status: DC | PRN
  Administered 2022-12-20: 120 mL

## 2022-12-20 MED ORDER — PHENYLEPHRINE 80 MCG/ML (10ML) SYRINGE FOR IV PUSH (FOR BLOOD PRESSURE SUPPORT)
PREFILLED_SYRINGE | INTRAVENOUS | Status: AC
  Filled 2022-12-20: qty 10

## 2022-12-20 MED ORDER — SODIUM CHLORIDE 0.9 % IV SOLN
INTRAVENOUS | Status: DC
  Administered 2022-12-20: 20 mL/h via INTRAVENOUS

## 2022-12-20 MED ORDER — EPHEDRINE 5 MG/ML INJ
INTRAVENOUS | Status: AC
  Filled 2022-12-20: qty 15

## 2022-12-20 NOTE — Op Note (Addendum)
[facility] Gastroenterology Patient Name: Derrick Sheppard Procedure Date: 12/20/2022 12:43 PM MRN: [id number] Account #: [id number] Date of Birth: [birth year] Admit Type: Outpatient Age: 67 Room: [facility] ENDO ROOM 3 Gender: Male Note Status: Finalized Instrument Name: Colonoscope [id number] Procedure:             Colonoscopy Indications:           Iron deficiency anemia Providers:             Andrey Farmer MD, MD Referring MD:          Ramonita Lab, MD (Referring MD) Medicines:             Monitored Anesthesia Care Complications:         No immediate complications. Estimated blood loss:                         Minimal. Procedure:             Pre-Anesthesia Assessment:                        - Prior to the procedure, a History and Physical was                         performed, and patient medications and allergies were                         reviewed. The patient is competent. The risks and                         benefits of the procedure and the sedation options and                         risks were discussed with the patient. All questions                         were answered and informed consent was obtained.                         Patient identification and proposed procedure were                         verified by the physician, the nurse, the                         anesthesiologist, the anesthetist and the technician                         in the endoscopy suite. Mental Status Examination:                         alert and oriented. Airway Examination: normal                         oropharyngeal airway and neck mobility. Respiratory                         Examination: clear to auscultation. CV Examination:  normal. Prophylactic Antibiotics: The patient does not                         require prophylactic antibiotics. Prior                         Anticoagulants: The patient has taken Eliquis                         (apixaban),  last dose was 7 days prior to procedure.                         ASA Grade Assessment: III - A patient with severe                         systemic disease. After reviewing the risks and                         benefits, the patient was deemed in satisfactory                         condition to undergo the procedure. The anesthesia                         plan was to use monitored anesthesia care (MAC).                         Immediately prior to administration of medications,                         the patient was re-assessed for adequacy to receive                         sedatives. The heart rate, respiratory rate, oxygen                         saturations, blood pressure, adequacy of pulmonary                         ventilation, and response to care were monitored                         throughout the procedure. The physical status of the                         patient was re-assessed after the procedure.                        After obtaining informed consent, the colonoscope was                         passed under direct vision. Throughout the procedure,                         the patient's blood pressure, pulse, and oxygen                         saturations were monitored continuously. The  Colonoscope was introduced through the anus and                         advanced to the the cecum, identified by appendiceal                         orifice and ileocecal valve. The colonoscopy was                         somewhat difficult due to a redundant colon. The                         patient tolerated the procedure well. The quality of                         the bowel preparation was fair. The ileocecal valve,                         appendiceal orifice, and rectum were photographed. Findings:      The perianal and digital rectal examinations were normal.      A 3 mm polyp was found in the cecum. The polyp was sessile. The polyp       was removed with a  cold snare. Resection and retrieval were complete.       Estimated blood loss was minimal.      A 5 mm polyp was found in the ascending colon. The polyp was sessile.       The polyp was removed with a cold snare. Resection and retrieval were       complete. Estimated blood loss was minimal.      A 3 mm polyp was found in the hepatic flexure. The polyp was sessile.       The polyp was removed with a cold snare. Resection and retrieval were       complete. Estimated blood loss was minimal.      A 2 mm polyp was found in the hepatic flexure. The polyp was sessile.       The polyp was removed with a jumbo cold forceps. Resection and retrieval       were complete. Estimated blood loss was minimal.      A 15 mm polyp was found in the hepatic flexure. The polyp was sessile.       The polyp was removed with a saline injection-lift technique using a hot       snare. The polyp was removed with a piecemeal technique using a hot       snare. Resection and retrieval were complete. To prevent bleeding       post-intervention, two hemostatic clips were successfully placed. There       was no bleeding during, or at the end, of the procedure. Area was       tattooed with an injection of 0.5 mL of [place] ink.      A post polypectomy scar was found at the hepatic flexure. There was       residual polyp tissue. The polyp was removed with a cold snare.       Resection and retrieval were complete. Estimated blood loss was minimal.      A 3 mm polyp was found in the descending colon. The polyp was sessile.  The polyp was removed with a cold snare. Resection and retrieval were       complete. Estimated blood loss was minimal.      Two sessile polyps were found in the sigmoid colon. The polyps were 3 to       7 mm in size. These polyps were removed with a cold snare. Resection and       retrieval were complete. Estimated blood loss was minimal.      A few small-mouthed diverticula were found in the sigmoid  colon.      The exam was otherwise without abnormality on direct and retroflexion       views. Impression:            - Preparation of the colon was fair.                        - One 3 mm polyp in the cecum, removed with a cold                         snare. Resected and retrieved.                        - One 5 mm polyp in the ascending colon, removed with                         a cold snare. Resected and retrieved.                        - One 3 mm polyp at the hepatic flexure, removed with                         a cold snare. Resected and retrieved.                        - One 2 mm polyp at the hepatic flexure, removed with                         a jumbo cold forceps. Resected and retrieved.                        - One 15 mm polyp at the hepatic flexure, removed                         using injection-lift and a hot snare and removed                         piecemeal using a hot snare. Resected and retrieved.                         Clips were placed. Tattooed.                        - Post-polypectomy scar at the hepatic flexure.                        - One 3 mm polyp in the descending colon, removed with  a cold snare. Resected and retrieved.                        - Two 3 to 7 mm polyps in the sigmoid colon, removed                         with a cold snare. Resected and retrieved.                        - Diverticulosis in the sigmoid colon.                        - The examination was otherwise normal on direct and                         retroflexion views. Recommendation:        - Discharge patient to home.                        - Resume previous diet.                        - Continue present medications.                        - Await pathology results.                        - Repeat colonoscopy in 6 months for surveillance                         after piecemeal polypectomy.                        - Return to referring physician as previously                          scheduled.                        - Resume Eliquis (apixaban) at prior dose in 2 days. Procedure Code(s):     --- Professional ---                        [phone], Colonoscopy, flexible; with removal of                         tumor(s), polyp(s), or other lesion(s) by snare                         technique                        45380, 82, Colonoscopy, flexible; with biopsy, single                         or multiple                        45381, Colonoscopy, flexible; with directed submucosal  injection(s), any substance Diagnosis Code(s):     --- Professional ---                        D12.0, Benign neoplasm of cecum                        D12.2, Benign neoplasm of ascending colon                        D12.3, Benign neoplasm of transverse colon (hepatic                         flexure or splenic flexure)                        D12.4, Benign neoplasm of descending colon                        Z98.890, Other specified postprocedural states                        D12.5, Benign neoplasm of sigmoid colon                        D50.9, Iron deficiency anemia, unspecified                        K57.30, Diverticulosis of large intestine without                         perforation or abscess without bleeding CPT copyright 2022 American Medical Association. All rights reserved. The codes documented in this report are preliminary and upon coder review may  be revised to meet current compliance requirements. Andrey Farmer MD, MD 12/20/2022 1:57:49 PM Number of Addenda: 0 Note Initiated On: 12/20/2022 12:43 PM Scope Withdrawal Time: 0 hours 35 minutes 24 seconds  Total Procedure Duration: 0 hours 42 minutes 58 seconds  Estimated Blood Loss:  Estimated blood loss was minimal.      [facility]

## 2022-12-20 NOTE — Anesthesia Postprocedure Evaluation (Signed)
Anesthesia Post Note  Patient: Jewel Curtright  Procedure(s) Performed: COLONOSCOPY WITH PROPOFOL ESOPHAGOGASTRODUODENOSCOPY (EGD) WITH PROPOFOL  Patient location during evaluation: PACU Anesthesia Type: General Level of consciousness: awake Pain management: satisfactory to patient Vital Signs Assessment: post-procedure vital signs reviewed and stable Respiratory status: nonlabored ventilation and respiratory function stable Cardiovascular status: stable Anesthetic complications: no   No notable events documented.   Last Vitals:  Vitals:   12/20/22 1128 12/20/22 1345  BP: (!) 114/58 (!) 99/59  Pulse: (!) 59 80  Resp: 20 16  Temp: (!) 35.8 C (!) 36.4 C  SpO2: 99% 98%    Last Pain:  Vitals:   12/20/22 1355  TempSrc:   PainSc: 0-No pain                 VAN STAVEREN,Tema Alire

## 2022-12-20 NOTE — H&P (Signed)
Outpatient short stay form Pre-procedure 12/20/2022  Derrick Rubenstein, MD  Primary Physician: Adin Hector, MD  Reason for visit:  IDA  History of present illness:    67 y/o gentleman with history of COPD, CAD, and hypertension here for EGD/Colonoscopy for IDA. Last dose of eliquis was 7 days ago. History of cholecystectomy. Father with stomach cancer and colon cancer in his 88's/80's. Patient's last colonoscopy in 2022 with several polyps including large lesions.    Current Facility-Administered Medications:    0.9 %  sodium chloride infusion, , Intravenous, Continuous, Canio Winokur, Hilton Cork, MD, Last Rate: 20 mL/hr at 12/20/22 1139, 20 mL/hr at 12/20/22 1139  Medications Prior to Admission  Medication Sig Dispense Refill Last Dose   ADVAIR DISKUS 250-50 MCG/DOSE AEPB SMARTSIG:1 Inhalation Via Inhaler Every 12 Hours   12/19/2022   albuterol (PROVENTIL HFA;VENTOLIN HFA) 108 (90 Base) MCG/ACT inhaler Inhale 2 puffs into the lungs every 6 (six) hours as needed for wheezing or shortness of breath.   12/19/2022   Cholecalciferol (VITAMIN D3) 50 MCG (2000 UT) capsule Take 2,000 Units by mouth daily.    12/19/2022   ELIQUIS 5 MG TABS tablet Take 5 mg by mouth 2 (two) times daily.   Past Week   gentamicin ointment (GARAMYCIN) 0.1 % Apply 1 Application topically daily. And cover with bandage 15 g 1 12/19/2022   INCRUSE ELLIPTA 62.5 MCG/INH AEPB Inhale 1 puff into the lungs daily.   12/19/2022   mupirocin ointment (BACTROBAN) 2 % Apply 1 application. topically 2 (two) times daily. 22 g 2 12/19/2022   Omeprazole 20 MG TBDD Take by mouth.   12/19/2022   pantoprazole (PROTONIX) 40 MG tablet Take 40 mg by mouth 2 (two) times a day.   12/19/2022   potassium chloride (KLOR-CON) 10 MEQ tablet Take 10 mEq by mouth daily.   12/19/2022   sulfamethoxazole-trimethoprim (BACTRIM DS) 800-160 MG tablet Take 1 tablet by mouth as directed. Take 1 tablet by mouth every 12 hours for 7 days 14 tablet 0 12/19/2022    telmisartan (MICARDIS) 80 MG tablet Take 80 mg by mouth daily.   12/19/2022   triamcinolone cream (KENALOG) 0.1 % SMARTSIG:1 Application Topical 2-3 Times Daily   12/19/2022   triamcinolone ointment (KENALOG) 0.1 % Apply to legs twice daily for up to 2 weeks as needed. Avoid applying to face, groin, and axilla. Use as directed. Long-term use can cause thinning of the skin. 30 g 1 12/19/2022   vitamin B-12 (CYANOCOBALAMIN) 1000 MCG tablet Take 1,000 mcg by mouth daily.   12/19/2022   mirtazapine (REMERON) 7.5 MG tablet Take 7.5 mg by mouth at bedtime as needed (anxiousness/rest).  (Patient not taking: Reported on 07/30/2021)        Allergies  Allergen Reactions   Amlodipine Swelling    Makes his ankles and legs swell.   Codeine Other (See Comments)    Gives him a Headache.     Past Medical History:  Diagnosis Date   Allergic state    Arthritis    Asthma    B12 deficiency    Basal cell carcinoma 03/11/2022   left calf, Mohs 04/20/22   BCC (basal cell carcinoma) 04/06/2022   left forearm, tx'd with EDC   Complication of anesthesia    Difficult to wake up with anesthesia from colonoscopy   COPD (chronic obstructive pulmonary disease) ([place])    Coronary artery disease    Dysrhythmia    Edema    GERD (gastroesophageal reflux  disease)    Headache    History of kidney stones    Hypertension    Palpitations    Pulmonary nodules    Sleep apnea     Review of systems:  Otherwise negative.    Physical Exam  Gen: Alert, oriented. Appears stated age.  HEENT: PERRLA. Lungs: No respiratory distress CV: RRR Abd: soft, benign, no masses Ext: No edema    Planned procedures: Proceed with EGD/colonoscopy. The patient understands the nature of the planned procedure, indications, risks, alternatives and potential complications including but not limited to bleeding, infection, perforation, damage to internal organs and possible oversedation/side effects from anesthesia. The patient agrees  and gives consent to proceed.  Please refer to procedure notes for findings, recommendations and patient disposition/instructions.     Derrick Rubenstein, MD [facility] Gastroenterology

## 2022-12-20 NOTE — Interval H&P Note (Signed)
History and Physical Interval Note:  12/20/2022 12:04 PM  Derrick Sheppard  has presented today for surgery, with the diagnosis of IDA.  The various methods of treatment have been discussed with the patient and family. After consideration of risks, benefits and other options for treatment, the patient has consented to  Procedure(s): COLONOSCOPY WITH PROPOFOL (N/A) ESOPHAGOGASTRODUODENOSCOPY (EGD) WITH PROPOFOL (N/A) as a surgical intervention.  The patient's history has been reviewed, patient examined, no change in status, stable for surgery.  I have reviewed the patient's chart and labs.  Questions were answered to the patient's satisfaction.     [name]  Ok to proceed with EGD/Colonoscopy

## 2022-12-20 NOTE — Anesthesia Preprocedure Evaluation (Signed)
Anesthesia Evaluation  Patient identified by MRN, date of birth, ID band Patient awake    Reviewed: Allergy & Precautions, NPO status , Patient's Chart, lab work & pertinent test results  Airway Mallampati: III  TM Distance: >3 FB Neck ROM: full    Dental  (+) Poor Dentition, Missing, Chipped, Dental Advisory Given   Pulmonary neg pulmonary ROS, asthma , sleep apnea , COPD,  COPD inhaler, former smoker   Pulmonary exam normal breath sounds clear to auscultation       Cardiovascular Exercise Tolerance: Good hypertension, Pt. on medications + CAD  negative cardio ROS Normal cardiovascular exam+ dysrhythmias Atrial Fibrillation  Rhythm:Regular Rate:Normal  Small patent PFO with bidirectional flow   Neuro/Psych TIAnegative neurological ROS  negative psych ROS   GI/Hepatic negative GI ROS, Neg liver ROS,GERD  ,,  Endo/Other  negative endocrine ROS    Renal/GU negative Renal ROS  negative genitourinary   Musculoskeletal   Abdominal  (+) + obese  Peds negative pediatric ROS (+)  Hematology negative hematology ROS (+)   Anesthesia Other Findings Past Medical History: No date: Allergic state No date: Arthritis No date: Asthma No date: B12 deficiency 03/11/2022: Basal cell carcinoma     Comment:  left calf, Mohs 04/20/22 04/06/2022: BCC (basal cell carcinoma)     Comment:  left forearm, tx'd with EDC No date: Complication of anesthesia     Comment:  Difficult to wake up with anesthesia from colonoscopy No date: COPD (chronic obstructive pulmonary disease) (HCC) No date: Coronary artery disease No date: Dysrhythmia No date: Edema No date: GERD (gastroesophageal reflux disease) No date: Headache No date: History of kidney stones No date: Hypertension No date: Palpitations No date: Pulmonary nodules No date: Sleep apnea  Past Surgical History: No date: CHOLECYSTECTOMY No date: COLONOSCOPY 04/09/2016: COLONOSCOPY  WITH PROPOFOL; N/A     Comment:  Procedure: COLONOSCOPY WITH PROPOFOL;  Surgeon: Lollie Sails, MD;  Location: [facility] ENDOSCOPY;  Service:               Endoscopy;  Laterality: N/A; 01/05/2021: COLONOSCOPY WITH PROPOFOL; N/A     Comment:  Procedure: COLONOSCOPY WITH PROPOFOL;  Surgeon: Toledo,               Benay Pike, MD;  Location: [facility] ENDOSCOPY;  Service:               Gastroenterology;  Laterality: N/A; 01/05/2021: ESOPHAGOGASTRODUODENOSCOPY (EGD) WITH PROPOFOL; N/A     Comment:  Procedure: ESOPHAGOGASTRODUODENOSCOPY (EGD) WITH               PROPOFOL;  Surgeon: Toledo, Benay Pike, MD;  Location:               [facility] ENDOSCOPY;  Service: Gastroenterology;  Laterality:               N/A; 02/21/2019: TEE WITHOUT CARDIOVERSION; N/A     Comment:  Procedure: TRANSESOPHAGEAL ECHOCARDIOGRAM (TEE);                Surgeon: Corey Skains, MD;  Location: [facility] ORS;                Service: Cardiovascular;  Laterality: N/A;  BMI    Body Mass Index: 27.69 kg/m      Reproductive/Obstetrics negative OB ROS  Anesthesia Physical Anesthesia Plan  ASA: 3  Anesthesia Plan: General   Post-op Pain Management:    Induction: Intravenous  PONV Risk Score and Plan: Propofol infusion and TIVA  Airway Management Planned: Natural Airway  Additional Equipment:   Intra-op Plan:   Post-operative Plan:   Informed Consent: I have reviewed the patients History and Physical, chart, labs and discussed the procedure including the risks, benefits and alternatives for the proposed anesthesia with the patient or authorized representative who has indicated his/her understanding and acceptance.     Dental Advisory Given  Plan Discussed with: CRNA and Surgeon  Anesthesia Plan Comments:        Anesthesia Quick Evaluation

## 2022-12-20 NOTE — Transfer of Care (Signed)
Immediate Anesthesia Transfer of Care Note  Patient: Derrick Sheppard  Procedure(s) Performed: COLONOSCOPY WITH PROPOFOL ESOPHAGOGASTRODUODENOSCOPY (EGD) WITH PROPOFOL  Patient Location: PACU  Anesthesia Type:General  Level of Consciousness: awake, alert , and oriented  Airway & Oxygen Therapy: Patient Spontanous Breathing  Post-op Assessment: Report given to RN and Post -op Vital signs reviewed and stable  Post vital signs: Reviewed and stable  Last Vitals:  Vitals Value Taken Time  BP    Temp    Pulse    Resp    SpO2      Last Pain:  Vitals:   12/20/22 1128  TempSrc: Temporal  PainSc: 0-No pain         Complications: No notable events documented.

## 2022-12-20 NOTE — Op Note (Signed)
[facility] Gastroenterology Patient Name: Derrick Sheppard Procedure Date: 12/20/2022 12:44 PM MRN: [id number] Account #: [id number] Date of Birth: [birth year] Admit Type: Outpatient Age: 67 Room: [facility] ENDO ROOM 3 Gender: Male Note Status: Finalized Instrument Name: Upper Endoscope [id number] Procedure:             Upper GI endoscopy Indications:           Iron deficiency anemia Providers:             Andrey Farmer MD, MD Referring MD:          Ramonita Lab, MD (Referring MD) Medicines:             Monitored Anesthesia Care Complications:         No immediate complications. Estimated blood loss:                         Minimal. Procedure:             Pre-Anesthesia Assessment:                        - Prior to the procedure, a History and Physical was                         performed, and patient medications and allergies were                         reviewed. The patient is competent. The risks and                         benefits of the procedure and the sedation options and                         risks were discussed with the patient. All questions                         were answered and informed consent was obtained.                         Patient identification and proposed procedure were                         verified by the physician, the nurse, the                         anesthesiologist, the anesthetist and the technician                         in the endoscopy suite. Mental Status Examination:                         alert and oriented. Airway Examination: normal                         oropharyngeal airway and neck mobility. Respiratory                         Examination: clear to auscultation. CV Examination:  normal. Prophylactic Antibiotics: The patient does not                         require prophylactic antibiotics. Prior                         Anticoagulants: The patient has taken Eliquis                          (apixaban), last dose was 7 days prior to procedure.                         ASA Grade Assessment: III - A patient with severe                         systemic disease. After reviewing the risks and                         benefits, the patient was deemed in satisfactory                         condition to undergo the procedure. The anesthesia                         plan was to use monitored anesthesia care (MAC).                         Immediately prior to administration of medications,                         the patient was re-assessed for adequacy to receive                         sedatives. The heart rate, respiratory rate, oxygen                         saturations, blood pressure, adequacy of pulmonary                         ventilation, and response to care were monitored                         throughout the procedure. The physical status of the                         patient was re-assessed after the procedure.                        After obtaining informed consent, the endoscope was                         passed under direct vision. Throughout the procedure,                         the patient's blood pressure, pulse, and oxygen                         saturations were monitored continuously. The Endoscope  was introduced through the mouth, and advanced to the                         second part of duodenum. The upper GI endoscopy was                         accomplished without difficulty. The patient tolerated                         the procedure well. Findings:      The examined esophagus was normal.      The entire examined stomach was normal. Biopsies were taken with a cold       forceps for histology. Estimated blood loss was minimal.      The examined duodenum was normal. Impression:            - Normal esophagus.                        - Normal stomach. Biopsied.                        - Normal examined duodenum. Recommendation:         - Await pathology results.                        - Perform a colonoscopy today. Procedure Code(s):     --- Professional ---                        [id number], Esophagogastroduodenoscopy, flexible,                         transoral; with biopsy, single or multiple Diagnosis Code(s):     --- Professional ---                        D50.9, Iron deficiency anemia, unspecified CPT copyright 2022 American Medical Association. All rights reserved. The codes documented in this report are preliminary and upon coder review may  be revised to meet current compliance requirements. Andrey Farmer MD, MD 12/20/2022 1:49:21 PM Number of Addenda: 0 Note Initiated On: 12/20/2022 12:44 PM Estimated Blood Loss:  Estimated blood loss was minimal.      [facility]

## 2022-12-21 ENCOUNTER — Encounter: Payer: Self-pay | Admitting: Gastroenterology

## 2022-12-22 ENCOUNTER — Ambulatory Visit
Admission: RE | Admit: 2022-12-22 | Discharge: 2022-12-22 | Disposition: A | Discharge status: Home / Self Care | Payer: Medicare HMO | Source: Ambulatory Visit | Attending: Internal Medicine | Admitting: Internal Medicine

## 2022-12-22 DIAGNOSIS — R911 Solitary pulmonary nodule: Secondary | ICD-10-CM | POA: Insufficient documentation

## 2022-12-22 DIAGNOSIS — I10 Essential (primary) hypertension: Secondary | ICD-10-CM | POA: Diagnosis present

## 2022-12-22 LAB — SURGICAL PATHOLOGY

## 2022-12-30 ENCOUNTER — Other Ambulatory Visit: Payer: Self-pay | Admitting: Gastroenterology

## 2022-12-30 DIAGNOSIS — R748 Abnormal levels of other serum enzymes: Secondary | ICD-10-CM

## 2023-01-05 ENCOUNTER — Ambulatory Visit
Admission: RE | Admit: 2023-01-05 | Discharge: 2023-01-05 | Disposition: A | Discharge status: Home / Self Care | Payer: Medicare HMO | Source: Ambulatory Visit | Attending: Gastroenterology | Admitting: Gastroenterology

## 2023-01-05 DIAGNOSIS — R748 Abnormal levels of other serum enzymes: Secondary | ICD-10-CM | POA: Insufficient documentation

## 2023-01-05 MED ORDER — GADOBUTROL 1 MMOL/ML IV SOLN
7.5000 mL | Freq: Once | INTRAVENOUS | Status: AC | PRN
  Administered 2023-01-05: 7.5 mL via INTRAVENOUS

## 2023-01-19 DIAGNOSIS — D649 Anemia, unspecified: Secondary | ICD-10-CM | POA: Insufficient documentation

## 2023-01-19 DIAGNOSIS — G4733 Obstructive sleep apnea (adult) (pediatric): Secondary | ICD-10-CM | POA: Insufficient documentation

## 2023-01-27 ENCOUNTER — Other Ambulatory Visit: Payer: Self-pay | Admitting: Gastroenterology

## 2023-01-27 DIAGNOSIS — R748 Abnormal levels of other serum enzymes: Secondary | ICD-10-CM

## 2023-01-27 DIAGNOSIS — Z7901 Long term (current) use of anticoagulants: Secondary | ICD-10-CM

## 2023-01-27 DIAGNOSIS — R933 Abnormal findings on diagnostic imaging of other parts of digestive tract: Secondary | ICD-10-CM

## 2023-02-05 NOTE — H&P (Signed)
Chief Complaint: Patient was seen in consultation today for abnormal LFTs/MRCP at the request of Woodard,Janice B  Referring Physician(s): Ardelia Mems  Supervising Physician: Pernell Dupre  Patient Status: [facility] - Out-pt  History of Present Illness: Derrick Sheppard is a 67 y.o. male followed by gastroenterology for abnormal LFTs.  Patient underwent MRCP 01/06/2023 that demonstrated abnormality.  He has known elevated alkaline phosphatase level and elevated mitochondrial AB.  Patient has been referred for random liver biopsy due to previous abnormal testing.  Patient denies chills, fever, CP, leg swelling, nausea, dizziness, HA or weakness. He endorses loss of appetite, fatigue, baseline shortness of breath from COPD, abdominal pain, intermittent blood in stool and easily bruised. He is n.p.o. per order. LD Eliquis was 02/06/2023 He denies the use of other AP/AC  Past Medical History:  Diagnosis Date   Allergic state    Arthritis    Asthma    B12 deficiency    Basal cell carcinoma 03/11/2022   left calf, Mohs 04/20/22   BCC (basal cell carcinoma) 04/06/2022   left forearm, tx'd with EDC   Complication of anesthesia    Difficult to wake up with anesthesia from colonoscopy   COPD (chronic obstructive pulmonary disease) (HCC)    Coronary artery disease    Dysrhythmia    Edema    GERD (gastroesophageal reflux disease)    Headache    History of kidney stones    Hypertension    Palpitations    Pulmonary nodules    Sleep apnea     Past Surgical History:  Procedure Laterality Date   CHOLECYSTECTOMY     COLONOSCOPY     COLONOSCOPY WITH PROPOFOL N/A 04/09/2016   Procedure: COLONOSCOPY WITH PROPOFOL;  Surgeon: Christena Deem, MD;  Location: [facility] ENDOSCOPY;  Service: Endoscopy;  Laterality: N/A;   COLONOSCOPY WITH PROPOFOL N/A 01/05/2021   Procedure: COLONOSCOPY WITH PROPOFOL;  Surgeon: Toledo, Boykin Nearing, MD;  Location: [facility] ENDOSCOPY;  Service: Gastroenterology;   Laterality: N/A;   COLONOSCOPY WITH PROPOFOL N/A 12/20/2022   Procedure: COLONOSCOPY WITH PROPOFOL;  Surgeon: Regis Bill, MD;  Location: [facility] ENDOSCOPY;  Service: Endoscopy;  Laterality: N/A;   ESOPHAGOGASTRODUODENOSCOPY (EGD) WITH PROPOFOL N/A 01/05/2021   Procedure: ESOPHAGOGASTRODUODENOSCOPY (EGD) WITH PROPOFOL;  Surgeon: Toledo, Boykin Nearing, MD;  Location: [facility] ENDOSCOPY;  Service: Gastroenterology;  Laterality: N/A;   ESOPHAGOGASTRODUODENOSCOPY (EGD) WITH PROPOFOL N/A 12/20/2022   Procedure: ESOPHAGOGASTRODUODENOSCOPY (EGD) WITH PROPOFOL;  Surgeon: Regis Bill, MD;  Location: [facility] ENDOSCOPY;  Service: Endoscopy;  Laterality: N/A;   TEE WITHOUT CARDIOVERSION N/A 02/21/2019   Procedure: TRANSESOPHAGEAL ECHOCARDIOGRAM (TEE);  Surgeon: Lamar Blinks, MD;  Location: [facility] ORS;  Service: Cardiovascular;  Laterality: N/A;    Allergies: Amlodipine and Codeine  Medications: Prior to Admission medications   Medication Sig Start Date End Date Taking? Authorizing Provider  ADVAIR DISKUS 250-50 MCG/DOSE AEPB SMARTSIG:1 Inhalation Via Inhaler Every 12 Hours 07/09/20   [provider]  albuterol (PROVENTIL HFA;VENTOLIN HFA) 108 (90 Base) MCG/ACT inhaler Inhale 2 puffs into the lungs every 6 (six) hours as needed for wheezing or shortness of breath.    [provider]  Cholecalciferol (VITAMIN D3) 50 MCG (2000 UT) capsule Take 2,000 Units by mouth daily.     [provider]  ELIQUIS 5 MG TABS tablet Take 5 mg by mouth 2 (two) times daily. 01/28/20   [provider]  gentamicin ointment (GARAMYCIN) 0.1 % Apply 1 Application topically daily. And cover with bandage 04/14/22  Moye, [place], MD  INCRUSE ELLIPTA 62.5 MCG/INH AEPB Inhale 1 puff into the lungs daily. 07/17/20   [provider]  mirtazapine (REMERON) 7.5 MG tablet Take 7.5 mg by mouth at bedtime as needed (anxiousness/rest).  Patient not taking: Reported on 07/30/2021    [provider]  mupirocin ointment (BACTROBAN) 2 % Apply 1 application. topically 2 (two) times daily. 12/17/21   Deirdre Evener, MD  Omeprazole 20 MG TBDD Take by mouth.    [provider]  pantoprazole (PROTONIX) 40 MG tablet Take 40 mg by mouth 2 (two) times a day. 01/27/19   [provider]  potassium chloride (KLOR-CON) 10 MEQ tablet Take 10 mEq by mouth daily. 05/28/19   [provider]  sulfamethoxazole-trimethoprim (BACTRIM DS) 800-160 MG tablet Take 1 tablet by mouth as directed. Take 1 tablet by mouth every 12 hours for 7 days 04/08/22   Neale Burly, [place], MD  telmisartan (MICARDIS) 80 MG tablet Take 80 mg by mouth daily. 12/21/18   [provider]  triamcinolone cream (KENALOG) 0.1 % SMARTSIG:1 Application Topical 2-3 Times Daily 05/11/21   [provider]  triamcinolone ointment (KENALOG) 0.1 % Apply to legs twice daily for up to 2 weeks as needed. Avoid applying to face, groin, and axilla. Use as directed. Long-term use can cause thinning of the skin. 09/08/22   Moye, [place], MD  vitamin B-12 (CYANOCOBALAMIN) 1000 MCG tablet Take 1,000 mcg by mouth daily.    [provider]     Family History  Problem Relation Age of Onset   Colon cancer Father 25       died of cancer   Stomach cancer Father    Cancer - Colon Father    Hypertension Mother    Prostate cancer Neg Hx    Bladder Cancer Neg Hx    Kidney cancer Neg Hx     Social History   Socioeconomic History   Marital status: Married    Spouse name: Not on file   Number of children: Not on file   Years of education: Not on file   Highest education level: Not on file  Occupational History   Not on file  Tobacco Use   Smoking status: Former    Packs/day: 1.50    Years: 46.00    Additional pack years: 0.00    Total pack years: 69.00    Types: Cigarettes    Quit date: 09/01/2017    Years since quitting: 5.4   Smokeless tobacco: Never  Vaping Use   Vaping Use: Never used   Substance and Sexual Activity   Alcohol use: No   Drug use: No   Sexual activity: Yes    Birth control/protection: None  Other Topics Concern   Not on file  Social History Narrative   46 years smoking; quit 2016; no alcohol; lives in [place]. Careers adviser. 1 living daughter-; 5 mins away.    Social Determinants of Health   Financial Resource Strain: Not on file  Food Insecurity: Not on file  Transportation Needs: Not on file  Physical Activity: Not on file  Stress: Not on file  Social Connections: Not on file     Review of Systems: A 12 point ROS discussed and pertinent positives are indicated in the HPI above.  All other systems are negative.  Review of Systems  Constitutional:  Positive for appetite change and fatigue. Negative for chills and fever.  Respiratory:  Positive for shortness of breath.   Cardiovascular:  Negative for chest pain and leg swelling.  Gastrointestinal:  Positive for abdominal pain and blood in stool. Negative for nausea and vomiting.  Neurological:  Negative for dizziness, weakness and headaches.  Hematological:  Bruises/bleeds easily.    Vital Signs: BP 133/85   Pulse 64   Resp 17   Ht 5\' 7"  (1.702 m)   Wt 178 lb (80.7 kg)   SpO2 95%   BMI 27.88 kg/m     Physical Exam Vitals reviewed.  Constitutional:      General: He is not in acute distress.    Appearance: Normal appearance. He is not ill-appearing.  HENT:     Head: Normocephalic and atraumatic.     Mouth/Throat:     Mouth: Mucous membranes are dry.     Pharynx: Oropharynx is clear.  Eyes:     Extraocular Movements: Extraocular movements intact.     Pupils: Pupils are equal, round, and reactive to light.  Cardiovascular:     Rate and Rhythm: Normal rate and regular rhythm.     Pulses: Normal pulses.     Heart sounds: Normal heart sounds.  Pulmonary:     Effort: Pulmonary effort is normal.     Breath sounds: Normal breath sounds.  Abdominal:     General: Bowel sounds are  normal. There is no distension.     Palpations: Abdomen is soft.     Tenderness: There is no abdominal tenderness. There is no guarding.  Musculoskeletal:     Right lower leg: No edema.     Left lower leg: No edema.  Skin:    General: Skin is warm and dry.  Neurological:     Mental Status: He is alert and oriented to person, place, and time.  Psychiatric:        Mood and Affect: Mood normal.        Behavior: Behavior normal.        Thought Content: Thought content normal.        Judgment: Judgment normal.     Imaging: No results found.  Labs:  CBC: Recent Labs    02/09/23 0918  WBC 5.2  HGB 11.6*  HCT 36.1*  PLT 177    COAGS: Recent Labs    02/09/23 0918  INR 1.0    BMP: No results for input(s): "NA", "K", "CL", "CO2", "GLUCOSE", "BUN", "CALCIUM", "CREATININE", "GFRNONAA", "GFRAA" in the last 8760 hours.  Invalid input(s): "CMP"  LIVER FUNCTION TESTS: No results for input(s): "BILITOT", "AST", "ALT", "ALKPHOS", "PROT", "ALBUMIN" in the last 8760 hours.  TUMOR MARKERS: No results for input(s): "AFPTM", "CEA", "CA199", "CHROMGRNA" in the last 8760 hours.  Assessment and Plan:  67 year old male with PMH X of COPD, CAD, HTN, tobacco abuse, CVA, paroxysmal A-fib on Eliquis presents to IR for random liver biopsy.  Patient resting on stretcher with family at bedside. He is alert and oriented, calm and pleasant. He is in no distress.   Risks and benefits of random liver biopsy with moderate sedation was discussed with the patient and/or patient's family including, but not limited to bleeding, infection, damage to adjacent structures or low yield requiring additional tests.  All of the questions were answered and there is agreement to proceed.  Consent signed and in chart.  Thank you for this interesting consult.  I greatly enjoyed meeting Derrick Sheppard and look forward to participating in their care.  A copy of this report was sent to the requesting  provider on this date.  Electronically  Signed: Shon Hough, NP 02/09/2023, 9:49 AM   I spent a total of 20 minutes in face to face in clinical consultation, greater than 50% of which was counseling/coordinating care for abnormal LFTs, abnormal MRCP.

## 2023-02-08 ENCOUNTER — Other Ambulatory Visit (HOSPITAL_COMMUNITY): Payer: Self-pay | Admitting: Student

## 2023-02-08 DIAGNOSIS — R748 Abnormal levels of other serum enzymes: Secondary | ICD-10-CM

## 2023-02-08 NOTE — Progress Notes (Signed)
Patient for US guided Liver Biopsy on Wed 02/09/2023, I called and spoke with the patient on the phone and gave pre-procedure instructions. Pt was made aware to be here at 9a, last dose of Eliquis was on Sunday 02/06/2023, NPO after MN prior to procedure as well as driver post procedure/recovery/discharge. Pt stated understanding.  Called 02/08/2023

## 2023-02-09 ENCOUNTER — Ambulatory Visit: Payer: Medicare HMO

## 2023-02-09 ENCOUNTER — Other Ambulatory Visit: Payer: Self-pay

## 2023-02-09 ENCOUNTER — Ambulatory Visit
Admission: RE | Admit: 2023-02-09 | Discharge: 2023-02-09 | Disposition: A | Discharge status: Home / Self Care | Payer: Medicare HMO | Source: Ambulatory Visit | Attending: Gastroenterology | Admitting: Gastroenterology

## 2023-02-09 DIAGNOSIS — R7989 Other specified abnormal findings of blood chemistry: Secondary | ICD-10-CM | POA: Diagnosis not present

## 2023-02-09 DIAGNOSIS — I2510 Atherosclerotic heart disease of native coronary artery without angina pectoris: Secondary | ICD-10-CM | POA: Diagnosis not present

## 2023-02-09 DIAGNOSIS — Z7951 Long term (current) use of inhaled steroids: Secondary | ICD-10-CM | POA: Insufficient documentation

## 2023-02-09 DIAGNOSIS — I480 Paroxysmal atrial fibrillation: Secondary | ICD-10-CM | POA: Diagnosis not present

## 2023-02-09 DIAGNOSIS — J4489 Other specified chronic obstructive pulmonary disease: Secondary | ICD-10-CM | POA: Diagnosis not present

## 2023-02-09 DIAGNOSIS — Z7901 Long term (current) use of anticoagulants: Secondary | ICD-10-CM | POA: Insufficient documentation

## 2023-02-09 DIAGNOSIS — R748 Abnormal levels of other serum enzymes: Secondary | ICD-10-CM | POA: Insufficient documentation

## 2023-02-09 DIAGNOSIS — Z79899 Other long term (current) drug therapy: Secondary | ICD-10-CM | POA: Diagnosis not present

## 2023-02-09 DIAGNOSIS — Z8673 Personal history of transient ischemic attack (TIA), and cerebral infarction without residual deficits: Secondary | ICD-10-CM | POA: Insufficient documentation

## 2023-02-09 DIAGNOSIS — I10 Essential (primary) hypertension: Secondary | ICD-10-CM | POA: Insufficient documentation

## 2023-02-09 DIAGNOSIS — Z87891 Personal history of nicotine dependence: Secondary | ICD-10-CM | POA: Insufficient documentation

## 2023-02-09 DIAGNOSIS — J449 Chronic obstructive pulmonary disease, unspecified: Secondary | ICD-10-CM | POA: Diagnosis not present

## 2023-02-09 DIAGNOSIS — R933 Abnormal findings on diagnostic imaging of other parts of digestive tract: Secondary | ICD-10-CM

## 2023-02-09 LAB — CBC
HCT: 36.1 % — ABNORMAL LOW (ref 39.0–52.0)
Hemoglobin: 11.6 g/dL — ABNORMAL LOW (ref 13.0–17.0)
MCH: 26.5 pg (ref 26.0–34.0)
MCHC: 32.1 g/dL (ref 30.0–36.0)
MCV: 82.4 fL (ref 80.0–100.0)
Platelets: 177 10*3/uL (ref 150–400)
RBC: 4.38 MIL/uL (ref 4.22–5.81)
RDW: 18.1 % — ABNORMAL HIGH (ref 11.5–15.5)
WBC: 5.2 10*3/uL (ref 4.0–10.5)
nRBC: 0 % (ref 0.0–0.2)

## 2023-02-09 LAB — PROTIME-INR
INR: 1 (ref 0.8–1.2)
Prothrombin Time: 13.5 seconds (ref 11.4–15.2)

## 2023-02-09 MED ORDER — LIDOCAINE HCL (PF) 1 % IJ SOLN
10.0000 mL | Freq: Once | INTRAMUSCULAR | Status: AC
  Administered 2023-02-09: 10 mL via INTRADERMAL

## 2023-02-09 MED ORDER — MIDAZOLAM HCL 2 MG/2ML IJ SOLN
INTRAMUSCULAR | Status: AC
  Filled 2023-02-09: qty 4

## 2023-02-09 MED ORDER — SODIUM CHLORIDE 0.9 % IV SOLN: INTRAVENOUS | Status: DC

## 2023-02-09 MED ORDER — MIDAZOLAM HCL 2 MG/2ML IJ SOLN
INTRAMUSCULAR | Status: AC | PRN
  Administered 2023-02-09 (×2): 1 mg via INTRAVENOUS

## 2023-02-09 MED ORDER — FENTANYL CITRATE (PF) 100 MCG/2ML IJ SOLN
INTRAMUSCULAR | Status: AC
  Filled 2023-02-09: qty 4

## 2023-02-09 MED ORDER — FENTANYL CITRATE (PF) 100 MCG/2ML IJ SOLN
INTRAMUSCULAR | Status: AC | PRN
  Administered 2023-02-09 (×2): 50 ug via INTRAVENOUS

## 2023-02-09 NOTE — Procedures (Signed)
Interventional Radiology Procedure Note  Date of Procedure: 02/09/2023  Procedure: [place] random liver core needle biopsy   Findings:  1. Random liver core needle biopsy 18ga right lobe x2 cores    Complications: No immediate complications noted.   Estimated Blood Loss: minimal  Follow-up and Recommendations: 1. Bedrest 2 hours    Olive Bass, MD  Vascular & Interventional Radiology  02/09/2023 10:34 AM

## 2023-02-11 LAB — SURGICAL PATHOLOGY

## 2023-03-03 ENCOUNTER — Ambulatory Visit: Payer: Medicare HMO | Admitting: Dermatology

## 2023-03-03 ENCOUNTER — Encounter: Payer: Self-pay | Admitting: Dermatology

## 2023-03-03 VITALS — BP 102/62

## 2023-03-03 DIAGNOSIS — D0439 Carcinoma in situ of skin of other parts of face: Secondary | ICD-10-CM

## 2023-03-03 DIAGNOSIS — L814 Other melanin hyperpigmentation: Secondary | ICD-10-CM

## 2023-03-03 DIAGNOSIS — L570 Actinic keratosis: Secondary | ICD-10-CM | POA: Diagnosis not present

## 2023-03-03 DIAGNOSIS — W908XXA Exposure to other nonionizing radiation, initial encounter: Secondary | ICD-10-CM

## 2023-03-03 DIAGNOSIS — Z1283 Encounter for screening for malignant neoplasm of skin: Secondary | ICD-10-CM | POA: Diagnosis not present

## 2023-03-03 DIAGNOSIS — X32XXXA Exposure to sunlight, initial encounter: Secondary | ICD-10-CM

## 2023-03-03 DIAGNOSIS — D1801 Hemangioma of skin and subcutaneous tissue: Secondary | ICD-10-CM

## 2023-03-03 DIAGNOSIS — L821 Other seborrheic keratosis: Secondary | ICD-10-CM

## 2023-03-03 DIAGNOSIS — D099 Carcinoma in situ, unspecified: Secondary | ICD-10-CM

## 2023-03-03 DIAGNOSIS — Z872 Personal history of diseases of the skin and subcutaneous tissue: Secondary | ICD-10-CM

## 2023-03-03 DIAGNOSIS — D492 Neoplasm of unspecified behavior of bone, soft tissue, and skin: Secondary | ICD-10-CM

## 2023-03-03 DIAGNOSIS — Z85828 Personal history of other malignant neoplasm of skin: Secondary | ICD-10-CM

## 2023-03-03 DIAGNOSIS — L578 Other skin changes due to chronic exposure to nonionizing radiation: Secondary | ICD-10-CM

## 2023-03-03 HISTORY — DX: Carcinoma in situ, unspecified: D09.9

## 2023-03-03 NOTE — Progress Notes (Signed)
Follow-Up Visit   Subjective  Derrick Sheppard is a 67 y.o. male who presents for the following: Skin Cancer Screening and Full Body Skin Exam, hx of BCC, Aks, Eczema bil lower leg, TMC 0.1% cr prn, hx of spot on L tongue, was benign per patient   The patient presents for Total-Body Skin Exam (TBSE) for skin cancer screening and mole check. The patient has spots, moles and lesions to be evaluated, some may be new or changing and the patient has concerns that these could be cancer.  Patient accompanied by wife.   The following portions of the chart were reviewed this encounter and updated as appropriate: medications, allergies, medical history  Review of Systems:  No other skin or systemic complaints except as noted in HPI or Assessment and Plan.  Objective  Well appearing patient in no apparent distress; mood and affect are within normal limits.  A full examination was performed including scalp, head, eyes, ears, nose, lips, neck, chest, axillae, abdomen, back, buttocks, bilateral upper extremities, bilateral lower extremities, hands, feet, fingers, toes, fingernails, and toenails. All findings within normal limits unless otherwise noted below.   Relevant physical exam findings are noted in the Assessment and Plan.  Right Temple 0.6 cm firm pink papule     left jaw x1, right hand x1, right 4th finger x1, R antecubital fossa x1 (4) Erythematous thin papules/macules with gritty scale.     Assessment & Plan   LENTIGINES, SEBORRHEIC KERATOSES, HEMANGIOMAS - Benign normal skin lesions - Benign-appearing - Call for any changes  MELANOCYTIC NEVI - Tan-brown and/or pink-flesh-colored symmetric macules and papules - Benign appearing on exam today - Observation - Call clinic for new or changing moles - Recommend daily use of broad spectrum spf 30+ sunscreen to sun-exposed areas.   ACTINIC DAMAGE - Chronic condition, secondary to cumulative UV/sun exposure - diffuse scaly  erythematous macules with underlying dyspigmentation - Recommend daily broad spectrum sunscreen SPF 30+ to sun-exposed areas, reapply every 2 hours as needed.  - Staying in the shade or wearing long sleeves, sun glasses (UVA+UVB protection) and wide brim hats (4-inch brim around the entire circumference of the hat) are also recommended for sun protection.  - Call for new or changing lesions.  SKIN CANCER SCREENING PERFORMED TODAY.  HISTORY OF BASAL CELL CARCINOMA OF THE SKIN. Left calf, Mohs, 04/20/2022. Left forearm, EDC, 04/06/2022. - No evidence of recurrence today - Recommend regular full body skin exams - Recommend daily broad spectrum sunscreen SPF 30+ to sun-exposed areas, reapply every 2 hours as needed.  - Call if any new or changing lesions are noted between office visits  - L calf, L forearm  Neoplasm of skin Right Temple  Skin / nail biopsy Type of biopsy: tangential   Informed consent: discussed and consent obtained   Anesthesia: the lesion was anesthetized in a standard fashion   Anesthesia comment:  Area prepped with alcohol Anesthetic:  1% lidocaine w/ epinephrine 1-100,000 buffered w/ 8.4% NaHCO3 Instrument used: flexible razor blade   Hemostasis achieved with: pressure, aluminum chloride and electrodesiccation   Outcome: patient tolerated procedure well   Post-procedure details: wound care instructions given   Post-procedure details comment:  Ointment and small bandage applied  Specimen 1 - Surgical pathology Differential Diagnosis: R/O SCC  Check Margins: No  Discussed Mohs surgery with patient and wife. Plan to refer for Mohs at [facility], pending pathology results.   AK (actinic keratosis) (4) left jaw x1, right hand x1, right  4th finger x1, R antecubital fossa x1  Actinic keratoses are precancerous spots that appear secondary to cumulative UV radiation exposure/sun exposure over time. They are chronic with expected duration over 1 year. A portion of  actinic keratoses will progress to squamous cell carcinoma of the skin. It is not possible to reliably predict which spots will progress to skin cancer and so treatment is recommended to prevent development of skin cancer.  Recommend daily broad spectrum sunscreen SPF 30+ to sun-exposed areas, reapply every 2 hours as needed.  Recommend staying in the shade or wearing long sleeves, sun glasses (UVA+UVB protection) and wide brim hats (4-inch brim around the entire circumference of the hat). Call for new or changing lesions.  Destruction of lesion - left jaw x1, right hand x1, right 4th finger x1, R antecubital fossa x1  Destruction method: cryotherapy   Informed consent: discussed and consent obtained   Lesion destroyed using liquid nitrogen: Yes   Region frozen until ice ball extended beyond lesion: Yes   Outcome: patient tolerated procedure well with no complications   Post-procedure details: wound care instructions given   Additional details:  Prior to procedure, discussed risks of blister formation, small wound, skin dyspigmentation, or rare scar following cryotherapy. Recommend Vaseline ointment to treated areas while healing.    Return in about 6 months (around 09/03/2023) for TBSE, HxBCC.  I, Lawson Radar, CMA, am acting as scribe for Darden Dates, MD.   Documentation: I have reviewed the above documentation for accuracy and completeness, and I agree with the above.  Darden Dates, MD

## 2023-03-03 NOTE — Patient Instructions (Addendum)
Cryotherapy Aftercare  Wash gently with soap and water everyday.   Apply Vaseline daily until healed.    Wound Care Instructions  Cleanse wound gently with soap and water once a day then pat dry with clean gauze. Apply a thin coat of Petrolatum (petroleum jelly, "Vaseline") over the wound (unless you have an allergy to this). We recommend that you use a new, sterile tube of Vaseline. Do not pick or remove scabs. Do not remove the yellow or white "healing tissue" from the base of the wound.  Cover the wound with fresh, clean, nonstick gauze and secure with paper tape. You may use Band-Aids in place of gauze and tape if the wound is small enough, but would recommend trimming much of the tape off as there is often too much. Sometimes Band-Aids can irritate the skin.  You should call the office for your biopsy report after 1 week if you have not already been contacted.  If you experience any problems, such as abnormal amounts of bleeding, swelling, significant bruising, significant pain, or evidence of infection, please call the office immediately.  FOR ADULT SURGERY PATIENTS: If you need something for pain relief you may take 1 extra strength Tylenol (acetaminophen) AND 2 Ibuprofen (200mg  each) together every 4 hours as needed for pain. (do not take these if you are allergic to them or if you have a reason you should not take them.) Typically, you may only need pain medication for 1 to 3 days.    Recommend Niacinamide or Nicotinamide 500mg  twice per day to lower risk of non-melanoma skin cancer by approximately 25%. This is usually available at [employer].     Recommend daily broad spectrum sunscreen SPF 30+ to sun-exposed areas, reapply every 2 hours as needed. Call for new or changing lesions.  Staying in the shade or wearing long sleeves, sun glasses (UVA+UVB protection) and wide brim hats (4-inch brim around the entire circumference of the hat) are also recommended for sun protection.     Recommend taking Heliocare sun protection supplement daily in sunny weather for additional sun protection. For maximum protection on the sunniest days, you can take up to 2 capsules of regular Heliocare OR take 1 capsule of Heliocare Ultra. For prolonged exposure (such as a full day in the sun), you can repeat your dose of the supplement 4 hours after your first dose. Heliocare can be purchased at [employer], at some [facility] or at [email or web address].     Due to recent changes in healthcare laws, you may see results of your pathology and/or laboratory studies on MyChart before the doctors have had a chance to review them. We understand that in some cases there may be results that are confusing or concerning to you. Please understand that not all results are received at the same time and often the doctors may need to interpret multiple results in order to provide you with the best plan of care or course of treatment. Therefore, we ask that you please give [place] 2 business days to thoroughly review all your results before contacting the office for clarification. Should we see a critical lab result, you will be contacted sooner.   If You Need Anything After Your Visit  If you have any questions or concerns for your doctor, please call our main line at [phone] and press option 4 to reach your doctor's medical assistant. If no one answers, please leave a voicemail as directed and we will return your call as soon as possible.  Messages left after 4 pm will be answered the following business day.   You may also send [place] a message via MyChart. We typically respond to MyChart messages within 1-2 business days.  For prescription refills, please ask your pharmacy to contact our office. Our fax number is [phone].  If you have an urgent issue when the clinic is closed that cannot wait until the next business day, you can page your doctor at the number below.    Please note that while we do  our best to be available for urgent issues outside of office hours, we are not available 24/7.   If you have an urgent issue and are unable to reach [place], you may choose to seek medical care at your doctor's office, retail clinic, urgent care center, or emergency room.  If you have a medical emergency, please immediately call 911 or go to the emergency department.  Pager Numbers  - Dr. Gwen Pounds: [phone]  - Dr. Neale Burly: [phone]  - Dr. Roseanne Reno: [phone]  In the event of inclement weather, please call our main line at [phone] for an update on the status of any delays or closures.  Dermatology Medication Tips: Please keep the boxes that topical medications come in in order to help keep track of the instructions about where and how to use these. Pharmacies typically print the medication instructions only on the boxes and not directly on the medication tubes.   If your medication is too expensive, please contact our office at [phone] option 4 or send [place] a message through MyChart.   We are unable to tell what your co-pay for medications will be in advance as this is different depending on your insurance coverage. However, we may be able to find a substitute medication at lower cost or fill out paperwork to get insurance to cover a needed medication.   If a prior authorization is required to get your medication covered by your insurance company, please allow [place] 1-2 business days to complete this process.  Drug prices often vary depending on where the prescription is filled and some pharmacies may offer cheaper prices.  The website www.goodrx.com contains coupons for medications through different pharmacies. The prices here do not account for what the cost may be with help from insurance (it may be cheaper with your insurance), but the website can give you the price if you did not use any insurance.  - You can print the associated coupon and take it with your prescription to the  pharmacy.  - You may also stop by our office during regular business hours and pick up a GoodRx coupon card.  - If you need your prescription sent electronically to a different pharmacy, notify our office through [facility] or by phone at [phone] option 4.     Si Usted Necesita Algo Despus de Su Visita  Tambin puede enviarnos un mensaje a travs de Clinical cytogeneticist. Por lo general respondemos a los mensajes de MyChart en el transcurso de 1 a 2 das hbiles.  Para renovar recetas, por favor pida a su farmacia que se ponga en contacto con nuestra oficina. Annie Sable de fax es [place] [phone].  Si tiene un asunto urgente cuando la clnica est cerrada y que no puede esperar hasta el siguiente da hbil, puede llamar/localizar a su doctor(a) al nmero que aparece a continuacin.   Por favor, tenga en cuenta que aunque hacemos todo lo posible para estar disponibles para asuntos urgentes fuera del horario de [place], no  estamos disponibles las 24 horas del da, los 7 [address]  [address] de la [place].   Si tiene un problema urgente y no puede comunicarse con nosotros, puede optar por buscar atencin mdica  en el consultorio de su doctor(a), en una clnica privada, en un centro de atencin urgente o en una sala de emergencias.  Si tiene Engineer, drilling, por favor llame inmediatamente al 911 o vaya a la sala de emergencias.  Nmeros de bper  - Dr. Gwen Pounds: [phone]  - Dra. Moye: [phone]  - Dra. Roseanne Reno: [phone]  En caso de inclemencias del [place], por favor llame a Lacy Duverney principal al [phone] para una actualizacin sobre el [place] de cualquier retraso o cierre.  Consejos para la medicacin en dermatologa: Por favor, guarde las cajas en las que vienen los medicamentos de uso tpico para ayudarle a seguir las instrucciones sobre dnde y cmo usarlos. Las farmacias generalmente imprimen las instrucciones del medicamento slo en las cajas y no directamente en los  tubos del [place].   Si su medicamento es muy caro, por favor, pngase en contacto con Rolm Gala llamando al [phone] y presione la opcin 4 o envenos un mensaje a travs de Clinical cytogeneticist.   No podemos decirle cul ser su copago por los medicamentos por adelantado ya que esto es diferente dependiendo de la cobertura de su seguro. Sin embargo, es posible que podamos encontrar un medicamento sustituto a Audiological scientist un formulario para que el seguro cubra el medicamento que se considera necesario.   Si se requiere una autorizacin previa para que su compaa de seguros [place] su medicamento, por favor permtanos de 1 a 2 das hbiles para completar [address]  Los precios de los medicamentos varan con frecuencia dependiendo del Environmental consultant de dnde se surte la receta y alguna farmacias pueden ofrecer precios ms baratos.  El sitio web www.goodrx.com tiene cupones para medicamentos de Health and safety inspector. Los precios aqu no tienen en cuenta lo que podra costar con la ayuda del seguro (puede ser ms barato con su seguro), pero el sitio web puede darle el precio si no utiliz Tourist information centre manager.  - Puede imprimir el cupn correspondiente y llevarlo con su receta a la farmacia.  - Tambin puede pasar por nuestra oficina durante el horario de atencin regular y Education officer, museum una tarjeta de cupones de GoodRx.  - Si necesita que su receta se enve electrnicamente a una farmacia diferente, informe a nuestra oficina a travs de MyChart de [place] o por telfono llamando al [phone] y presione la opcin 4.

## 2023-03-10 ENCOUNTER — Telehealth: Payer: Self-pay

## 2023-03-10 DIAGNOSIS — D099 Carcinoma in situ, unspecified: Secondary | ICD-10-CM

## 2023-03-10 NOTE — Telephone Encounter (Signed)
Discussed pathology results. Patient voiced understanding. Referral sent to [facility] for Mohs.  

## 2023-03-10 NOTE — Telephone Encounter (Signed)
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 Please let me know if they have any questions. Thank you!

## 2023-03-25 ENCOUNTER — Telehealth: Payer: Self-pay | Admitting: Dermatology

## 2023-03-25 NOTE — Telephone Encounter (Addendum)
Please send patient a certified letter. Thank you!    Dear Mr. Mcguirt,  I hope this letter finds you well.  It has been my sincere pleasure to be a part of your healthcare team. At the time of my leaving, our records show you have a squamous cell in situ skin cancer at the right temple which has not yet been treated.   Since I will be unable to follow-up with you in future, I am writing to stress the importance of keeping your Mohs surgery appointment. When treated, skin cancer typically has very good outcomes. However, if skin cancer is left untreated it can pose serious health risks including destroying nearby tissue or even death.   If for any reason you are unable to go to your appointment or if you do not have your appointment scheduled, please reach out to the [facility] at [phone].  You can also reach out to our office at [phone] option 4 or through MyChart if you need assistance.  If you have any concerns about your treatment and wish to discuss other treatment options, please reach out to our office as well. You can reach [place] by phone at [phone] option 4 or through [employer].   As I am transitioning from the practice, I regret that I will not be able to continue with your care personally. However, I am confident you are in capable hands with my colleagues and the team at [facility].   Thank you for entrusting me with your skin health. 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 Please do not hesitate to reach out if you have any questions or concerns.  Wishing you continued health and well-being.  Sincerely,  Sandi Mealy, MD, MPH Dermatologist [facility] [phone] option 4

## 2023-03-28 ENCOUNTER — Encounter: Payer: Self-pay | Admitting: Dermatology

## 2023-07-04 DIAGNOSIS — M0579 Rheumatoid arthritis with rheumatoid factor of multiple sites without organ or systems involvement: Secondary | ICD-10-CM | POA: Insufficient documentation

## 2023-08-11 DIAGNOSIS — K743 Primary biliary cirrhosis: Secondary | ICD-10-CM | POA: Insufficient documentation

## 2023-08-30 ENCOUNTER — Ambulatory Visit: Payer: Medicare HMO | Admitting: Dermatology

## 2023-08-30 DIAGNOSIS — L814 Other melanin hyperpigmentation: Secondary | ICD-10-CM | POA: Diagnosis not present

## 2023-08-30 DIAGNOSIS — Z8589 Personal history of malignant neoplasm of other organs and systems: Secondary | ICD-10-CM

## 2023-08-30 DIAGNOSIS — Z1283 Encounter for screening for malignant neoplasm of skin: Secondary | ICD-10-CM

## 2023-08-30 DIAGNOSIS — D229 Melanocytic nevi, unspecified: Secondary | ICD-10-CM

## 2023-08-30 DIAGNOSIS — L578 Other skin changes due to chronic exposure to nonionizing radiation: Secondary | ICD-10-CM

## 2023-08-30 DIAGNOSIS — L820 Inflamed seborrheic keratosis: Secondary | ICD-10-CM | POA: Diagnosis not present

## 2023-08-30 DIAGNOSIS — L719 Rosacea, unspecified: Secondary | ICD-10-CM

## 2023-08-30 DIAGNOSIS — D692 Other nonthrombocytopenic purpura: Secondary | ICD-10-CM

## 2023-08-30 DIAGNOSIS — Z7189 Other specified counseling: Secondary | ICD-10-CM

## 2023-08-30 DIAGNOSIS — I872 Venous insufficiency (chronic) (peripheral): Secondary | ICD-10-CM

## 2023-08-30 DIAGNOSIS — Z85828 Personal history of other malignant neoplasm of skin: Secondary | ICD-10-CM

## 2023-08-30 NOTE — Patient Instructions (Addendum)

## 2023-08-30 NOTE — Progress Notes (Signed)
Follow-Up Visit   Subjective  Derrick Sheppard is a 67 y.o. male who presents for the following: Skin Cancer Screening and Full Body Skin Exam Hx of sccis, hx of multiple bcc, hx of aks, hx of isks  6 month tbse   The patient presents for Total-Body Skin Exam (TBSE) for skin cancer screening and mole check. The patient has spots, moles and lesions to be evaluated, some may be new or changing and the patient may have concern these could be cancer.    The following portions of the chart were reviewed this encounter and updated as appropriate: medications, allergies, medical history  Review of Systems:  No other skin or systemic complaints except as noted in HPI or Assessment and Plan.  Objective  Well appearing patient in no apparent distress; mood and affect are within normal limits.  A full examination was performed including scalp, head, eyes, ears, nose, lips, neck, chest, axillae, abdomen, back, buttocks, bilateral upper extremities, bilateral lower extremities, hands, feet, fingers, toes, fingernails, and toenails. All findings within normal limits unless otherwise noted below.   Relevant physical exam findings are noted in the Assessment and Plan.  b/l arms x 6 (6) Erythematous stuck-on, waxy papule or plaque    Assessment & Plan   SKIN CANCER SCREENING PERFORMED TODAY.  ACTINIC DAMAGE - Chronic condition, secondary to cumulative UV/sun exposure - diffuse scaly erythematous macules with underlying dyspigmentation - Recommend daily broad spectrum sunscreen SPF 30+ to sun-exposed areas, reapply every 2 hours as needed.  - Staying in the shade or wearing long sleeves, sun glasses (UVA+UVB protection) and wide brim hats (4-inch brim around the entire circumference of the hat) are also recommended for sun protection.  - Call for new or changing lesions.  LENTIGINES, SEBORRHEIC KERATOSES, HEMANGIOMAS - Benign normal skin lesions - Benign-appearing - Call for any  changes  MELANOCYTIC NEVI - Tan-brown and/or pink-flesh-colored symmetric macules and papules - Benign appearing on exam today - Observation - Call clinic for new or changing moles - Recommend daily use of broad spectrum spf 30+ sunscreen to sun-exposed areas.   Purpura - Chronic; persistent and recurrent.  Treatable, but not curable. - Violaceous macules and patches - Benign - Related to trauma, age, sun damage and/or use of blood thinners, chronic use of topical and/or oral steroids - Observe - Can use OTC arnica containing moisturizer such as Dermend Bruise Formula if desired - Call for worsening or other concerns  HISTORY OF BASAL CELL CARCINOMA OF THE SKIN. Left calf, Mohs, 04/20/2022. Left forearm, EDC, 04/06/2022. - No evidence of recurrence today - Recommend regular full body skin exams - Recommend daily broad spectrum sunscreen SPF 30+ to sun-exposed areas, reapply every 2 hours as needed.  - Call if any new or changing lesions are noted between office visits  - L calf, L forearm  HISTORY OF SQUAMOUS CELL CARCINOMA IN SITU OF THE SKIN 03/03/23 right temple Mohs 04/19/2023 - No evidence of recurrence today - Recommend regular full body skin exams - Recommend daily broad spectrum sunscreen SPF 30+ to sun-exposed areas, reapply every 2 hours as needed.  - Call if any new or changing lesions are noted between office visits  ROSACEA with rhinophyma  Exam Mid face erythema with telangiectasias +/- scattered inflammatory papules at nose   wellcontrolled vs notatgoal vs flared  Rosacea is a chronic progressive skin condition usually affecting the face of adults, causing redness and/or acne bumps. It is treatable but not curable. It sometimes  affects the eyes (ocular rosacea) as well. It may respond to topical and/or systemic medication and can flare with stress, sun exposure, alcohol, exercise, topical steroids (including hydrocortisone/cortisone 10) and some foods.  Daily  application of broad spectrum spf 30+ sunscreen to face is recommended to reduce flares.  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  Chronic and persistent condition with duration or expected duration over one year. Condition is symptomatic/ bothersome to patient. Not currently at goal.   Stasis in the legs causes chronic leg swelling, which may result in itchy or painful rashes, skin discoloration, skin texture changes, and sometimes ulceration.  Recommend daily graduated compression hose/stockings- easiest to put on first thing in morning, remove at bedtime.  Elevate legs as much as possible. Avoid salt/sodium rich foods.  Treatment Plan: No treatment recommended    Inflamed seborrheic keratosis (6) b/l arms x 6  Symptomatic, irritating, patient would like treated.  Destruction of lesion - b/l arms x 6 (6) Complexity: simple   Destruction method: cryotherapy   Informed consent: discussed and consent obtained   Timeout:  patient name, date of birth, surgical site, and procedure verified Lesion destroyed using liquid nitrogen: Yes   Region frozen until ice ball extended beyond lesion: Yes   Outcome: patient tolerated procedure well with no complications   Post-procedure details: wound care instructions given     Return in about 1 year (around 08/29/2024) for TBSE.  IAsher Muir, CMA, am acting as scribe for Armida Sans, MD.   Documentation: I have reviewed the above documentation for accuracy and completeness, and I agree with the above.  Armida Sans, MD

## 2023-09-04 ENCOUNTER — Encounter: Payer: Self-pay | Admitting: Dermatology

## 2023-10-27 ENCOUNTER — Other Ambulatory Visit: Payer: Self-pay

## 2023-10-27 ENCOUNTER — Emergency Department: Payer: Medicare HMO

## 2023-10-27 ENCOUNTER — Encounter: Payer: Self-pay | Admitting: Emergency Medicine

## 2023-10-27 DIAGNOSIS — R0789 Other chest pain: Secondary | ICD-10-CM | POA: Diagnosis not present

## 2023-10-27 DIAGNOSIS — Z5321 Procedure and treatment not carried out due to patient leaving prior to being seen by health care provider: Secondary | ICD-10-CM | POA: Insufficient documentation

## 2023-10-27 DIAGNOSIS — J449 Chronic obstructive pulmonary disease, unspecified: Secondary | ICD-10-CM | POA: Diagnosis not present

## 2023-10-27 DIAGNOSIS — R0602 Shortness of breath: Secondary | ICD-10-CM | POA: Insufficient documentation

## 2023-10-27 LAB — CBC
HCT: 38.2 % — ABNORMAL LOW (ref 39.0–52.0)
Hemoglobin: 12.4 g/dL — ABNORMAL LOW (ref 13.0–17.0)
MCH: 29.3 pg (ref 26.0–34.0)
MCHC: 32.5 g/dL (ref 30.0–36.0)
MCV: 90.3 fL (ref 80.0–100.0)
Platelets: 90 10*3/uL — ABNORMAL LOW (ref 150–400)
RBC: 4.23 MIL/uL (ref 4.22–5.81)
RDW: 13.6 % (ref 11.5–15.5)
WBC: 3.8 10*3/uL — ABNORMAL LOW (ref 4.0–10.5)
nRBC: 0 % (ref 0.0–0.2)

## 2023-10-27 LAB — BASIC METABOLIC PANEL
Anion gap: 10 (ref 5–15)
BUN: 10 mg/dL (ref 8–23)
CO2: 24 mmol/L (ref 22–32)
Calcium: 8.8 mg/dL — ABNORMAL LOW (ref 8.9–10.3)
Chloride: 106 mmol/L (ref 98–111)
Creatinine, Ser: 1.25 mg/dL — ABNORMAL HIGH (ref 0.61–1.24)
GFR, Estimated: >60 mL/min (ref >60)
Glucose, Bld: 93 mg/dL (ref 70–99)
Potassium: 3.7 mmol/L (ref 3.5–5.1)
Sodium: 140 mmol/L (ref 135–145)

## 2023-10-27 LAB — TROPONIN I (HIGH SENSITIVITY): Troponin I (High Sensitivity): 5 ng/L (ref <18)

## 2023-10-27 LAB — PROTIME-INR
INR: 1.1 (ref 0.8–1.2)
Prothrombin Time: 14.8 s (ref 11.4–15.2)

## 2023-10-27 NOTE — ED Triage Notes (Signed)
Patient to ED via POV from [facility] for SOB. Patient states ongoing x2 days. States pain in left chest when taking a deep breath or coughing. Non-productive cough. Hx COPD

## 2023-10-28 ENCOUNTER — Emergency Department
Admission: EM | Admit: 2023-10-28 | Discharge: 2023-10-28 | Discharge status: Against Medical Advice | Payer: Medicare HMO | Attending: Student in an Organized Health Care Education/Training Program | Admitting: Student in an Organized Health Care Education/Training Program

## 2023-10-28 LAB — TROPONIN I (HIGH SENSITIVITY): Troponin I (High Sensitivity): 5 ng/L (ref <18)

## 2024-01-02 ENCOUNTER — Encounter: Payer: Self-pay | Admitting: Emergency Medicine

## 2024-01-09 ENCOUNTER — Ambulatory Visit
Admission: RE | Admit: 2024-01-09 | Discharge: 2024-01-09 | Disposition: A | Discharge status: Home / Self Care | Payer: Medicare HMO | Source: Ambulatory Visit | Attending: Gastroenterology | Admitting: Gastroenterology

## 2024-01-09 ENCOUNTER — Encounter: Payer: Self-pay | Admitting: *Deleted

## 2024-01-09 ENCOUNTER — Ambulatory Visit: Admitting: Anesthesiology

## 2024-01-09 ENCOUNTER — Encounter
Admission: RE | Disposition: A | Discharge status: Home / Self Care | Payer: Self-pay | Source: Ambulatory Visit | Attending: Gastroenterology

## 2024-01-09 DIAGNOSIS — Z87891 Personal history of nicotine dependence: Secondary | ICD-10-CM | POA: Diagnosis not present

## 2024-01-09 DIAGNOSIS — K6389 Other specified diseases of intestine: Secondary | ICD-10-CM | POA: Diagnosis not present

## 2024-01-09 DIAGNOSIS — K640 First degree hemorrhoids: Secondary | ICD-10-CM | POA: Diagnosis not present

## 2024-01-09 DIAGNOSIS — J449 Chronic obstructive pulmonary disease, unspecified: Secondary | ICD-10-CM | POA: Insufficient documentation

## 2024-01-09 DIAGNOSIS — I2510 Atherosclerotic heart disease of native coronary artery without angina pectoris: Secondary | ICD-10-CM | POA: Diagnosis not present

## 2024-01-09 DIAGNOSIS — Z7951 Long term (current) use of inhaled steroids: Secondary | ICD-10-CM | POA: Diagnosis not present

## 2024-01-09 DIAGNOSIS — G4733 Obstructive sleep apnea (adult) (pediatric): Secondary | ICD-10-CM | POA: Insufficient documentation

## 2024-01-09 DIAGNOSIS — D122 Benign neoplasm of ascending colon: Secondary | ICD-10-CM | POA: Insufficient documentation

## 2024-01-09 DIAGNOSIS — Z09 Encounter for follow-up examination after completed treatment for conditions other than malignant neoplasm: Secondary | ICD-10-CM | POA: Diagnosis present

## 2024-01-09 DIAGNOSIS — I10 Essential (primary) hypertension: Secondary | ICD-10-CM | POA: Diagnosis not present

## 2024-01-09 DIAGNOSIS — Z7901 Long term (current) use of anticoagulants: Secondary | ICD-10-CM | POA: Diagnosis not present

## 2024-01-09 DIAGNOSIS — K219 Gastro-esophageal reflux disease without esophagitis: Secondary | ICD-10-CM | POA: Insufficient documentation

## 2024-01-09 DIAGNOSIS — Z79899 Other long term (current) drug therapy: Secondary | ICD-10-CM | POA: Diagnosis not present

## 2024-01-09 DIAGNOSIS — R0602 Shortness of breath: Secondary | ICD-10-CM | POA: Diagnosis not present

## 2024-01-09 DIAGNOSIS — R519 Headache, unspecified: Secondary | ICD-10-CM | POA: Diagnosis not present

## 2024-01-09 HISTORY — PX: HEMOSTASIS CLIP PLACEMENT: SHX6857

## 2024-01-09 HISTORY — PX: COLONOSCOPY WITH PROPOFOL: SHX5780

## 2024-01-09 SURGERY — COLONOSCOPY WITH PROPOFOL
Anesthesia: General

## 2024-01-09 MED ORDER — PROPOFOL 500 MG/50ML IV EMUL
INTRAVENOUS | Status: DC | PRN
  Administered 2024-01-09: 80 mg via INTRAVENOUS
  Administered 2024-01-09: 150 ug/kg/min via INTRAVENOUS

## 2024-01-09 MED ORDER — PHENYLEPHRINE 80 MCG/ML (10ML) SYRINGE FOR IV PUSH (FOR BLOOD PRESSURE SUPPORT)
PREFILLED_SYRINGE | INTRAVENOUS | Status: DC | PRN
  Administered 2024-01-09 (×2): 80 ug via INTRAVENOUS

## 2024-01-09 MED ORDER — PROPOFOL 1000 MG/100ML IV EMUL
INTRAVENOUS | Status: AC
Start: 2024-01-09 — End: ?
  Filled 2024-01-09: qty 100

## 2024-01-09 MED ORDER — SODIUM CHLORIDE 0.9 % IV SOLN
INTRAVENOUS | Status: DC
  Administered 2024-01-09: 20 mL/h via INTRAVENOUS

## 2024-01-09 NOTE — Anesthesia Postprocedure Evaluation (Signed)
 Anesthesia Post Note  Patient: Derrick Sheppard  Procedure(s) Performed: COLONOSCOPY WITH PROPOFOL CONTROL OF HEMORRHAGE, GI TRACT, ENDOSCOPIC, BY CLIPPING OR OVERSEWING  Patient location during evaluation: Endoscopy Anesthesia Type: General Level of consciousness: awake and alert Pain management: pain level controlled Vital Signs Assessment: post-procedure vital signs reviewed and stable Respiratory status: spontaneous breathing, nonlabored ventilation, respiratory function stable and patient connected to nasal cannula oxygen Cardiovascular status: blood pressure returned to baseline and stable Postop Assessment: no apparent nausea or vomiting Anesthetic complications: no   There were no known notable events for this encounter.   Last Vitals:  Vitals:   01/09/24 0946 01/09/24 0956  BP: 90/62 95/70  Pulse: 74 65  Resp: 19 14  Temp:    SpO2: 99% 100%    Last Pain:  Vitals:   01/09/24 0956  TempSrc:   PainSc: 0-No pain                 Cleda Mccreedy Yoali Conry

## 2024-01-09 NOTE — H&P (Signed)
 Outpatient short stay form Pre-procedure 01/09/2024  Derrick Bill, MD  Primary Physician: Lynnea Ferrier, MD  Reason for visit:  Surveillance colonoscopy  History of present illness:    68 y/o gentleman with history of hypertension, COPD, and OSA here for surveillance colonoscopy due to piecemeal polypectomy. Last colonoscopy in 2024 with piecemeal resection of hepatic flexture polyp. Last took eliquis over two days ago. History of cholecystectomy.    Current Facility-Administered Medications:    0.9 %  sodium chloride infusion, , Intravenous, Continuous, Aadan Chenier, Rossie Muskrat, MD, Last Rate: 20 mL/hr at 01/09/24 0809, Continued from Pre-op at 01/09/24 0809  Medications Prior to Admission  Medication Sig Dispense Refill Last Dose/Taking   ADVAIR DISKUS 250-50 MCG/DOSE AEPB SMARTSIG:1 Inhalation Via Inhaler Every 12 Hours   01/09/2024 at  6:00 AM   albuterol (PROVENTIL HFA;VENTOLIN HFA) 108 (90 Base) MCG/ACT inhaler Inhale 2 puffs into the lungs every 6 (six) hours as needed for wheezing or shortness of breath.   01/09/2024 at  6:00 AM   Cholecalciferol (VITAMIN D3) 50 MCG (2000 UT) capsule Take 2,000 Units by mouth daily.    Past Week   ELIQUIS 5 MG TABS tablet Take 5 mg by mouth 2 (two) times daily.   Past Week   ferrous sulfate 325 (65 FE) MG EC tablet Take by mouth.   Past Week   gentamicin ointment (GARAMYCIN) 0.1 % Apply 1 Application topically daily. And cover with bandage 15 g 1 Past Week   INCRUSE ELLIPTA 62.5 MCG/INH AEPB Inhale 1 puff into the lungs daily.   01/09/2024 at  6:00 AM   mirtazapine (REMERON) 7.5 MG tablet Take 7.5 mg by mouth at bedtime as needed (anxiousness/rest).   Past Week   mupirocin ointment (BACTROBAN) 2 % Apply 1 application. topically 2 (two) times daily. 22 g 2 Past Week   Omeprazole 20 MG TBDD Take by mouth.   Past Week   pantoprazole (PROTONIX) 40 MG tablet Take 40 mg by mouth 2 (two) times a day.   Past Week   potassium chloride (KLOR-CON) 10 MEQ  tablet Take 10 mEq by mouth daily.   Past Week   sulfamethoxazole-trimethoprim (BACTRIM DS) 800-160 MG tablet Take 1 tablet by mouth as directed. Take 1 tablet by mouth every 12 hours for 7 days 14 tablet 0 Past Week   telmisartan (MICARDIS) 80 MG tablet Take 80 mg by mouth daily.   Past Week   triamcinolone cream (KENALOG) 0.1 % SMARTSIG:1 Application Topical 2-3 Times Daily   Past Week   triamcinolone ointment (KENALOG) 0.1 % Apply to legs twice daily for up to 2 weeks as needed. Avoid applying to face, groin, and axilla. Use as directed. Long-term use can cause thinning of the skin. 30 g 1 Past Week   vitamin B-12 (CYANOCOBALAMIN) 1000 MCG tablet Take 1,000 mcg by mouth daily.   Past Week     Allergies  Allergen Reactions   Amlodipine Swelling    Makes his ankles and legs swell.   Codeine Other (See Comments)    Gives him a Headache.     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 SCCis, hypertrophic. Mohs 04/19/2023    Review of systems:  Otherwise negative.    Physical Exam  Gen: Alert, oriented. Appears stated age.  HEENT: PERRLA. Lungs: No respiratory distress CV: RRR Abd: soft, benign, no masses Ext: No edema    Planned procedures: Proceed with colonoscopy. The patient understands the nature of the planned procedure, indications, risks, alternatives and  potential complications including but not limited to bleeding, infection, perforation, damage to internal organs and possible oversedation/side effects from anesthesia. The patient agrees and gives consent to proceed.  Please refer to procedure notes for findings, recommendations and patient disposition/instructions.     Derrick Bill, MD [facility]

## 2024-01-09 NOTE — Op Note (Signed)
 [facility] Gastroenterology Patient Name: Derrick Sheppard Procedure Date: 01/09/2024 8:46 AM MRN: [id number] Account #: [id number] Date of Birth: [birth year] Admit Type: Outpatient Age: 68 Room: [facility] ENDO ROOM 3 Gender: Male Note Status: Finalized Instrument Name: Nelda Marseille [id number] Procedure:             Colonoscopy Indications:           Surveillance: Piecemeal removal of large sessile                         adenoma last colonoscopy (< 3 yrs) Providers:             Eather Colas MD, MD Referring MD:          Daniel Nones, MD (Referring MD) Medicines:             Monitored Anesthesia Care Complications:         No immediate complications. Estimated blood loss:                         Minimal. Procedure:             Pre-Anesthesia Assessment:                        - Prior to the procedure, a History and Physical was                         performed, and patient medications and allergies were                         reviewed. The patient is competent. The risks and                         benefits of the procedure and the sedation options and                         risks were discussed with the patient. All questions                         were answered and informed consent was obtained.                         Patient identification and proposed procedure were                         verified by the physician, the nurse, the                         anesthesiologist, the anesthetist and the technician                         in the endoscopy suite. Mental Status Examination:                         alert and oriented. Airway Examination: normal                         oropharyngeal airway and neck mobility. Respiratory  Examination: clear to auscultation. CV Examination:                         normal. Prophylactic Antibiotics: The patient does not                         require prophylactic antibiotics. Prior                          Anticoagulants: The patient has taken Eliquis                         (apixaban), last dose was 2 days prior to procedure.                         ASA Grade Assessment: III - A patient with severe                         systemic disease. After reviewing the risks and                         benefits, the patient was deemed in satisfactory                         condition to undergo the procedure. The anesthesia                         plan was to use monitored anesthesia care (MAC).                         Immediately prior to administration of medications,                         the patient was re-assessed for adequacy to receive                         sedatives. The heart rate, respiratory rate, oxygen                         saturations, blood pressure, adequacy of pulmonary                         ventilation, and response to care were monitored                         throughout the procedure. The physical status of the                         patient was re-assessed after the procedure.                        After obtaining informed consent, the colonoscope was                         passed under direct vision. Throughout the procedure,                         the patient's blood pressure, pulse, and oxygen  saturations were monitored continuously. The                         Colonoscope was introduced through the anus and                         advanced to the the cecum, identified by appendiceal                         orifice and ileocecal valve. The colonoscopy was                         performed without difficulty. The patient tolerated                         the procedure well. The quality of the bowel                         preparation was adequate to identify polyps. The                         ileocecal valve, appendiceal orifice, and rectum were                         photographed. Findings:      The perianal and digital rectal  examinations were normal.      A localized area of mildly erythematous mucosa was found in the       ascending colon. It appeared to be a possible avm vs polyp. The polyp       was removed with a cold snare. Resection and retrieval were complete.       There was a fair amount of bleeding afterwards so the decision was made       to place clips. To prevent bleeding after the polypectomy, two       hemostatic clips were successfully placed. There was no bleeding at the       end of the procedure.      A tattoo was seen at the hepatic flexure. A post-polypectomy scar was       found at the tattoo site.      Internal hemorrhoids were found during retroflexion. The hemorrhoids       were Grade I (internal hemorrhoids that do not prolapse).      The exam was otherwise without abnormality on direct and retroflexion       views. Impression:            - Erythematous mucosa in the ascending colon. Clips                         were placed.                        - A tattoo was seen at the hepatic flexure. A                         post-polypectomy scar was found at the tattoo site.                        - Internal hemorrhoids.                        -  The examination was otherwise normal on direct and                         retroflexion views. Recommendation:        - Discharge patient to home.                        - Resume previous diet.                        - Resume Eliquis (apixaban) at prior dose tomorrow.                        - Await pathology results.                        - Repeat colonoscopy for surveillance based on                         pathology results.                        - Return to referring physician as previously                         scheduled. Procedure Code(s):     --- Professional ---                        [id number], Colonoscopy, flexible; with removal of                         tumor(s), polyp(s), or other lesion(s) by snare                          technique Diagnosis Code(s):     --- Professional ---                        Z86.010, Personal history of colonic polyps                        K63.89, Other specified diseases of intestine                        K64.0, First degree hemorrhoids CPT copyright 2022 [employer]. All rights reserved. The codes documented in this report are preliminary and upon coder review may  be revised to meet current compliance requirements. Eather Colas MD, MD 01/09/2024 9:37:53 AM Number of Addenda: 0 Note Initiated On: 01/09/2024 8:46 AM Scope Withdrawal Time: 0 hours 15 minutes 57 seconds  Total Procedure Duration: 0 hours 23 minutes 1 second  Estimated Blood Loss:  Estimated blood loss was minimal.      [facility]

## 2024-01-09 NOTE — Interval H&P Note (Signed)
 History and Physical Interval Note:  01/09/2024 8:59 AM  Derrick Sheppard  has presented today for surgery, with the diagnosis of PH TA polyps.  The various methods of treatment have been discussed with the patient and family. After consideration of risks, benefits and other options for treatment, the patient has consented to  Procedure(s): COLONOSCOPY WITH PROPOFOL (N/A) as a surgical intervention.  The patient's history has been reviewed, patient examined, no change in status, stable for surgery.  I have reviewed the patient's chart and labs.  Questions were answered to the patient's satisfaction.     Regis Bill  Ok to proceed with colonoscopy

## 2024-01-09 NOTE — Anesthesia Preprocedure Evaluation (Signed)
 Anesthesia Evaluation  Patient identified by MRN, date of birth, ID band Patient awake    Reviewed: Allergy & Precautions, NPO status , Patient's Chart, lab work & pertinent test results  History of Anesthesia Complications Negative for: history of anesthetic complications  Airway Mallampati: III  TM Distance: <3 FB Neck ROM: full    Dental  (+) Chipped, Poor Dentition   Pulmonary shortness of breath and with exertion, asthma , sleep apnea , COPD, former smoker   Pulmonary exam normal        Cardiovascular hypertension, + CAD  Normal cardiovascular exam+ dysrhythmias      Neuro/Psych  Headaches CVA  negative psych ROS   GI/Hepatic Neg liver ROS,GERD  Controlled,,  Endo/Other  negative endocrine ROS    Renal/GU negative Renal ROS  negative genitourinary   Musculoskeletal   Abdominal   Peds  Hematology negative hematology ROS (+)   Anesthesia Other Findings Past Medical History: No date: Actinic keratosis No date: Allergic state No date: Arthritis No date: Asthma No date: B12 deficiency 03/11/2022: Basal cell carcinoma     Comment:  left calf, Mohs 04/20/22 04/06/2022: BCC (basal cell carcinoma)     Comment:  left forearm, tx'd with EDC No date: Complication of anesthesia     Comment:  Difficult to wake up with anesthesia from colonoscopy No date: COPD (chronic obstructive pulmonary disease) (HCC) No date: Coronary artery disease No date: Dysrhythmia No date: Edema No date: GERD (gastroesophageal reflux disease) No date: Headache No date: History of kidney stones No date: Hypertension No date: Palpitations No date: Pulmonary nodules No date: Sleep apnea 03/03/2023: Squamous cell carcinoma in situ     Comment:  Right temple. SCCis, hypertrophic. Mohs 04/19/2023  Past Surgical History: No date: CHOLECYSTECTOMY No date: COLONOSCOPY 04/09/2016: COLONOSCOPY WITH PROPOFOL; N/A     Comment:  Procedure:  COLONOSCOPY WITH PROPOFOL;  Surgeon: Christena Deem, MD;  Location: [facility] ENDOSCOPY;  Service:               Endoscopy;  Laterality: N/A; 01/05/2021: COLONOSCOPY WITH PROPOFOL; N/A     Comment:  Procedure: COLONOSCOPY WITH PROPOFOL;  Surgeon: Tol[place],               Boykin Nearing, MD;  Location: [facility] ENDOSCOPY;  Service:               Gastroenterology;  Laterality: N/A; 12/20/2022: COLONOSCOPY WITH PROPOFOL; N/A     Comment:  Procedure: COLONOSCOPY WITH PROPOFOL;  Surgeon:               Regis Bill, MD;  Location: [facility] ENDOSCOPY;                Service: Endoscopy;  Laterality: N/A; 01/05/2021: ESOPHAGOGASTRODUODENOSCOPY (EGD) WITH PROPOFOL; N/A     Comment:  Procedure: ESOPHAGOGASTRODUODENOSCOPY (EGD) WITH               PROPOFOL;  Surgeon: Toledo, Boykin Nearing, MD;  Location:               [facility] ENDOSCOPY;  Service: Gastroenterology;  Laterality:               N/A; 12/20/2022: ESOPHAGOGASTRODUODENOSCOPY (EGD) WITH PROPOFOL; N/A     Comment:  Procedure: ESOPHAGOGASTRODUODENOSCOPY (EGD) WITH               PROPOFOL;  Surgeon: Regis Bill, MD;  Location:  [facility] ENDOSCOPY;  Service: Endoscopy;  Laterality: N/A; 02/21/2019: TEE WITHOUT CARDIOVERSION; N/A     Comment:  Procedure: TRANSESOPHAGEAL ECHOCARDIOGRAM (TEE);                Surgeon: Lamar Blinks, MD;  Location: [facility] ORS;                Service: Cardiovascular;  Laterality: N/A;  BMI    Body Mass Index: 27.13 kg/m      Reproductive/Obstetrics negative OB ROS                             Anesthesia Physical Anesthesia Plan  ASA: 3  Anesthesia Plan: General   Post-op Pain Management:    Induction: Intravenous  PONV Risk Score and Plan: Propofol infusion and TIVA  Airway Management Planned: Natural Airway and Nasal Cannula  Additional Equipment:   Intra-op Plan:   Post-operative Plan:   Informed Consent: I have reviewed the patients History and Physical, chart,  labs and discussed the procedure including the risks, benefits and alternatives for the proposed anesthesia with the patient or authorized representative who has indicated his/her understanding and acceptance.     Dental Advisory Given  Plan Discussed with: Anesthesiologist, CRNA and Surgeon  Anesthesia Plan Comments: (Patient consented for risks of anesthesia including but not limited to:  - adverse reactions to medications - risk of airway placement if required - damage to eyes, teeth, lips or other oral mucosa - nerve damage due to positioning  - sore throat or hoarseness - Damage to heart, brain, nerves, lungs, other parts of body or loss of life  Patient voiced understanding and assent.)       Anesthesia Quick Evaluation

## 2024-01-09 NOTE — Transfer of Care (Signed)
 Immediate Anesthesia Transfer of Care Note  Patient: Derrick Sheppard  Procedure(s) Performed: COLONOSCOPY WITH PROPOFOL  Patient Location: PACU  Anesthesia Type:General  Level of Consciousness: sedated  Airway & Oxygen Therapy: Patient Spontanous Breathing  Post-op Assessment: Report given to RN and Post -op Vital signs reviewed and stable  Post vital signs: Reviewed and stable  Last Vitals:  Vitals Value Taken Time  BP 115/53 01/09/24 0938  Temp 36.2 C 01/09/24 0936  Pulse 70 01/09/24 0938  Resp 14 01/09/24 0938  SpO2 94 % 01/09/24 0938  Vitals shown include unfiled device data.  Last Pain:  Vitals:   01/09/24 0936  TempSrc: Temporal  PainSc: Asleep         Complications: There were no known notable events for this encounter.

## 2024-01-10 ENCOUNTER — Encounter: Payer: Self-pay | Admitting: Gastroenterology

## 2024-01-10 LAB — SURGICAL PATHOLOGY

## 2024-03-21 ENCOUNTER — Telehealth: Payer: Self-pay | Admitting: *Deleted

## 2024-03-21 NOTE — Telephone Encounter (Signed)
 Patient called to see when he will get appt to come to cancer center. The patient was referred today. I told him that it usually takes 1-2 weeks for most people.

## 2024-03-26 ENCOUNTER — Inpatient Hospital Stay

## 2024-03-26 ENCOUNTER — Inpatient Hospital Stay: Attending: Internal Medicine | Admitting: Internal Medicine

## 2024-03-26 ENCOUNTER — Encounter: Payer: Self-pay | Admitting: Internal Medicine

## 2024-03-26 VITALS — BP 104/56 | HR 79 | Temp 97.6°F | Resp 18 | Wt 178.4 lb

## 2024-03-26 DIAGNOSIS — Z85828 Personal history of other malignant neoplasm of skin: Secondary | ICD-10-CM | POA: Insufficient documentation

## 2024-03-26 DIAGNOSIS — R5383 Other fatigue: Secondary | ICD-10-CM | POA: Insufficient documentation

## 2024-03-26 DIAGNOSIS — D696 Thrombocytopenia, unspecified: Secondary | ICD-10-CM | POA: Insufficient documentation

## 2024-03-26 DIAGNOSIS — D649 Anemia, unspecified: Secondary | ICD-10-CM | POA: Diagnosis not present

## 2024-03-26 DIAGNOSIS — Z87891 Personal history of nicotine dependence: Secondary | ICD-10-CM | POA: Diagnosis not present

## 2024-03-26 DIAGNOSIS — Z800 Family history of malignant neoplasm of digestive organs: Secondary | ICD-10-CM | POA: Insufficient documentation

## 2024-03-26 DIAGNOSIS — Z7901 Long term (current) use of anticoagulants: Secondary | ICD-10-CM | POA: Diagnosis not present

## 2024-03-26 DIAGNOSIS — G4733 Obstructive sleep apnea (adult) (pediatric): Secondary | ICD-10-CM | POA: Insufficient documentation

## 2024-03-26 DIAGNOSIS — J4489 Other specified chronic obstructive pulmonary disease: Secondary | ICD-10-CM | POA: Diagnosis not present

## 2024-03-26 DIAGNOSIS — M069 Rheumatoid arthritis, unspecified: Secondary | ICD-10-CM | POA: Diagnosis not present

## 2024-03-26 DIAGNOSIS — I4891 Unspecified atrial fibrillation: Secondary | ICD-10-CM | POA: Insufficient documentation

## 2024-03-26 DIAGNOSIS — D72819 Decreased white blood cell count, unspecified: Secondary | ICD-10-CM | POA: Insufficient documentation

## 2024-03-26 LAB — IRON AND TIBC
Iron: 23 ug/dL — ABNORMAL LOW (ref 45–182)
Saturation Ratios: 5 % — ABNORMAL LOW (ref 17.9–39.5)
TIBC: 451 ug/dL — ABNORMAL HIGH (ref 250–450)
UIBC: 428 ug/dL

## 2024-03-26 LAB — TECHNOLOGIST SMEAR REVIEW
Plt Morphology: DECREASED
RBC MORPHOLOGY: NORMAL
WBC MORPHOLOGY: NORMAL

## 2024-03-26 LAB — COMPREHENSIVE METABOLIC PANEL WITH GFR
ALT: 14 U/L (ref 0–44)
AST: 19 U/L (ref 15–41)
Albumin: 3.6 g/dL (ref 3.5–5.0)
Alkaline Phosphatase: 205 U/L — ABNORMAL HIGH (ref 38–126)
Anion gap: 8 (ref 5–15)
BUN: 15 mg/dL (ref 8–23)
CO2: 22 mmol/L (ref 22–32)
Calcium: 8.5 mg/dL — ABNORMAL LOW (ref 8.9–10.3)
Chloride: 105 mmol/L (ref 98–111)
Creatinine, Ser: 1.07 mg/dL (ref 0.61–1.24)
GFR, Estimated: >60 mL/min (ref >60)
Glucose, Bld: 101 mg/dL — ABNORMAL HIGH (ref 70–99)
Potassium: 4.1 mmol/L (ref 3.5–5.1)
Sodium: 135 mmol/L (ref 135–145)
Total Bilirubin: 1.1 mg/dL (ref 0.0–1.2)
Total Protein: 6.9 g/dL (ref 6.5–8.1)

## 2024-03-26 LAB — CBC WITH DIFFERENTIAL/PLATELET
Abs Immature Granulocytes: 0.01 10*3/uL (ref 0.00–0.07)
Basophils Absolute: 0 10*3/uL (ref 0.0–0.1)
Basophils Relative: 1 %
Eosinophils Absolute: 0 10*3/uL (ref 0.0–0.5)
Eosinophils Relative: 1 %
HCT: 29.1 % — ABNORMAL LOW (ref 39.0–52.0)
Hemoglobin: 8.7 g/dL — ABNORMAL LOW (ref 13.0–17.0)
Immature Granulocytes: 0 %
Lymphocytes Relative: 20 %
Lymphs Abs: 0.7 10*3/uL (ref 0.7–4.0)
MCH: 23.8 pg — ABNORMAL LOW (ref 26.0–34.0)
MCHC: 29.9 g/dL — ABNORMAL LOW (ref 30.0–36.0)
MCV: 79.7 fL — ABNORMAL LOW (ref 80.0–100.0)
Monocytes Absolute: 0.4 10*3/uL (ref 0.1–1.0)
Monocytes Relative: 9 %
Neutro Abs: 2.6 10*3/uL (ref 1.7–7.7)
Neutrophils Relative %: 69 %
Platelets: 126 10*3/uL — ABNORMAL LOW (ref 150–400)
RBC: 3.65 MIL/uL — ABNORMAL LOW (ref 4.22–5.81)
RDW: 13.7 % (ref 11.5–15.5)
Smear Review: NORMAL
WBC: 3.7 10*3/uL — ABNORMAL LOW (ref 4.0–10.5)
nRBC: 0 % (ref 0.0–0.2)

## 2024-03-26 LAB — ABO/RH: ABO/RH(D): B POS

## 2024-03-26 LAB — LACTATE DEHYDROGENASE: LDH: 126 U/L (ref 98–192)

## 2024-03-26 LAB — FERRITIN: Ferritin: 13 ng/mL — ABNORMAL LOW (ref 24–336)

## 2024-03-26 LAB — FOLATE: Folate: 6.7 ng/mL (ref >5.9)

## 2024-03-26 LAB — SAMPLE TO BLOOD BANK

## 2024-03-26 LAB — RETICULOCYTES
Immature Retic Fract: 12 % (ref 2.3–15.9)
RBC.: 3.5 MIL/uL — ABNORMAL LOW (ref 4.22–5.81)
Retic Count, Absolute: 80.6 10*3/uL (ref 19.0–186.0)
Retic Ct Pct: 2.3 % (ref 0.4–3.1)

## 2024-03-26 LAB — C-REACTIVE PROTEIN: CRP: 0.8 mg/dL (ref <1.0)

## 2024-03-26 LAB — VITAMIN B12: Vitamin B-12: 1438 pg/mL — ABNORMAL HIGH (ref 180–914)

## 2024-03-26 NOTE — Progress Notes (Signed)
 Patient has no concerns

## 2024-03-26 NOTE — Progress Notes (Signed)
 [facility] CONSULT NOTE  Patient Care Team: Melchor Spoon, MD as PCP - General (Internal Medicine) Gwyn Leos, MD as Consulting Physician (Oncology)  CHIEF COMPLAINTS/PURPOSE OF CONSULTATION: ANEMIA   HEMATOLOGY HISTORY  # ANEMIA[Hb; MCV-platelets- WBC; Iron sat; ferritin;  GFR- CT/US ; EGD/colonoscopy-   Latest Reference Range & Units 10/27/23 16:24  WBC 4.0 - 10.5 K/uL 3.8 (L)  RBC 4.22 - 5.81 MIL/uL 4.23  Hemoglobin 13.0 - 17.0 g/dL 40.9 (L)  HCT 81.1 - 91.4 % 38.2 (L)  MCV 80.0 - 100.0 fL 90.3  MCH 26.0 - 34.0 pg 29.3  MCHC 30.0 - 36.0 g/dL 78.2  RDW 95.6 - 21.3 % 13.6  Platelets 150 - 400 K/uL 90 (L)  (L): Data is abnormally low  HISTORY OF PRESENTING ILLNESS:  Derrick Sheppard 68 y.o.  male with  pleasant patient with history of COPD-OSA- [facility]- CAD/A.fib on eliquis ; PBC-no hepatosplenomegaly was been referred to us  for further evaluation of anemia/thrombocytopenia-.  Patient to have progressive worsening anemia/thrombocytopenia.  Patient denies any bleeding.  Patient has poor tolerance to oral iron because of nausea.  Patient states that his liver numbers are improving while on ursodiol.  Blood in stools: none Blood in urine:none Difficulty swallowing:none Change of bowel movement/constipation:none Prior blood transfusion:remote  Kidney  Liver disease: PBC- [[facility]]- liver biopsy- march 2024.  Alcohol: none Bariatric surgery: none   Prior bone marrow biopsy: none Oral iron: Iron-nausea.  Prior IV iron infusions: none    Review of Systems  Constitutional:  Positive for malaise/fatigue and weight loss. Negative for chills, diaphoresis and fever.  HENT:  Negative for nosebleeds and sore throat.   Eyes:  Negative for double vision.  Respiratory:  Negative for cough, hemoptysis, sputum production, shortness of breath and wheezing.   Cardiovascular:  Negative for chest pain, palpitations, orthopnea and leg swelling.   Gastrointestinal:  Negative for abdominal pain, blood in stool, constipation, diarrhea, heartburn, melena, nausea and vomiting.  Genitourinary:  Negative for dysuria, frequency and urgency.  Musculoskeletal:  Positive for back pain and joint pain.  Skin: Negative.  Negative for itching and rash.  Neurological:  Negative for dizziness, tingling, focal weakness, weakness and headaches.  Endo/Heme/Allergies:  Does not bruise/bleed easily.  Psychiatric/Behavioral:  Negative for depression. The patient is not nervous/anxious and does not have insomnia.      MEDICAL HISTORY:  Past Medical History:  Diagnosis Date   Actinic keratosis    Allergic state    Arthritis    Asthma    B12 deficiency    Basal cell carcinoma 03/11/2022   left calf, Mohs 04/20/22   BCC (basal cell carcinoma) 04/06/2022   left forearm, tx'd with EDC   Complication of anesthesia    Difficult to wake up with anesthesia from colonoscopy   COPD (chronic obstructive pulmonary disease) (HCC)    Coronary artery disease    Dysrhythmia    Edema    GERD (gastroesophageal reflux disease)    Headache    History of kidney stones    Hypertension    Palpitations    Pulmonary nodules    Sleep apnea    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 SCCis, hypertrophic. Mohs 04/19/2023    SURGICAL HISTORY: Past Surgical History:  Procedure Laterality Date   CHOLECYSTECTOMY     COLONOSCOPY     COLONOSCOPY WITH PROPOFOL  N/A 04/09/2016   Procedure: COLONOSCOPY WITH PROPOFOL ;  Surgeon: Deveron Fly, MD;  Location: [facility] ENDOSCOPY;  Service: Endoscopy;  Laterality: N/A;   COLONOSCOPY WITH PROPOFOL  N/A 01/05/2021   Procedure: COLONOSCOPY WITH PROPOFOL ;  Surgeon: Toledo, Alphonsus Jeans, MD;  Location: [facility] ENDOSCOPY;  Service: Gastroenterology;  Laterality: N/A;   COLONOSCOPY WITH PROPOFOL  N/A 12/20/2022   Procedure: COLONOSCOPY WITH PROPOFOL ;  Surgeon: Shane Darling, MD;  Location: [facility] ENDOSCOPY;  Service:  Endoscopy;  Laterality: N/A;   COLONOSCOPY WITH PROPOFOL  N/A 01/09/2024   Procedure: COLONOSCOPY WITH PROPOFOL ;  Surgeon: Shane Darling, MD;  Location: [facility] ENDOSCOPY;  Service: Endoscopy;  Laterality: N/A;   ESOPHAGOGASTRODUODENOSCOPY (EGD) WITH PROPOFOL  N/A 01/05/2021   Procedure: ESOPHAGOGASTRODUODENOSCOPY (EGD) WITH PROPOFOL ;  Surgeon: Toledo, Alphonsus Jeans, MD;  Location: [facility] ENDOSCOPY;  Service: Gastroenterology;  Laterality: N/A;   ESOPHAGOGASTRODUODENOSCOPY (EGD) WITH PROPOFOL  N/A 12/20/2022   Procedure: ESOPHAGOGASTRODUODENOSCOPY (EGD) WITH PROPOFOL ;  Surgeon: Shane Darling, MD;  Location: [facility] ENDOSCOPY;  Service: Endoscopy;  Laterality: N/A;   HEMOSTASIS CLIP PLACEMENT  01/09/2024   Procedure: CONTROL OF HEMORRHAGE, GI TRACT, ENDOSCOPIC, BY CLIPPING OR OVERSEWING;  Surgeon: Shane Darling, MD;  Location: [facility] ENDOSCOPY;  Service: Endoscopy;;   TEE WITHOUT CARDIOVERSION N/A 02/21/2019   Procedure: TRANSESOPHAGEAL ECHOCARDIOGRAM (TEE);  Surgeon: Michelle Aid, MD;  Location: [facility] ORS;  Service: Cardiovascular;  Laterality: N/A;    SOCIAL HISTORY: Social History   Socioeconomic History   Marital status: Married    Spouse name: Not on file   Number of children: Not on file   Years of education: Not on file   Highest education level: Not on file  Occupational History   Not on file  Tobacco Use   Smoking status: Former    Current packs/day: 0.00    Average packs/day: 1.5 packs/day for 46.0 years (69.0 ttl pk-yrs)    Types: Cigarettes    Start date: 09/02/1971    Quit date: 09/01/2017    Years since quitting: 6.5   Smokeless tobacco: Never  Vaping Use   Vaping status: Never Used  Substance and Sexual Activity   Alcohol use: No   Drug use: No   Sexual activity: Yes    Birth control/protection: None  Other Topics Concern   Not on file  Social History Narrative   46 years smoking; quit 2016; no alcohol; lives in [place]. Careers adviser. 1 living daughter-; 5  mins away.    Social Drivers of Corporate investment banker Strain: Low Risk  (11/09/2023)   Received from [facility]   Overall Financial Resource Strain (CARDIA)    Difficulty of Paying Living Expenses: Not hard at all  Food Insecurity: No Food Insecurity (03/26/2024)   Hunger Vital Sign    Worried About Running Out of Food in the Last Year: Never true    Ran Out of Food in the Last Year: Never true  Transportation Needs: No Transportation Needs (03/26/2024)   PRAPARE - Administrator, Civil Service (Medical): No    Lack of Transportation (Non-Medical): No  Physical Activity: Not on file  Stress: Not on file  Social Connections: Not on file  Intimate Partner Violence: Not At Risk (03/26/2024)   Humiliation, Afraid, Rape, and Kick questionnaire    Fear of Current or Ex-Partner: No    Emotionally Abused: No    Physically Abused: No    Sexually Abused: No    FAMILY HISTORY: Family History  Problem Relation Age of Onset   Colon cancer Father 19       died of cancer   Stomach  cancer Father    Cancer - Colon Father    Hypertension Mother    Prostate cancer Neg Hx    Bladder Cancer Neg Hx    Kidney cancer Neg Hx     ALLERGIES:  is allergic to amlodipine and codeine.  MEDICATIONS:  Current Outpatient Medications  Medication Sig Dispense Refill   ADVAIR DISKUS 250-50 MCG/DOSE AEPB SMARTSIG:1 Inhalation Via Inhaler Every 12 Hours     albuterol  (PROVENTIL  HFA;VENTOLIN  HFA) 108 (90 Base) MCG/ACT inhaler Inhale 2 puffs into the lungs every 6 (six) hours as needed for wheezing or shortness of breath.     Cholecalciferol  (VITAMIN D3) 50 MCG (2000 UT) capsule Take 2,000 Units by mouth daily.      ELIQUIS  5 MG TABS tablet Take 5 mg by mouth 2 (two) times daily.     ferrous sulfate 325 (65 FE) MG EC tablet Take by mouth.     gentamicin  ointment (GARAMYCIN ) 0.1 % Apply 1 Application topically daily. And cover with bandage 15 g 1   hydroxychloroquine  (PLAQUENIL) 200 MG tablet Take 200 mg by mouth 2 (two) times daily.     INCRUSE ELLIPTA  62.5 MCG/INH AEPB Inhale 1 puff into the lungs daily.     mirtazapine  (REMERON ) 7.5 MG tablet Take 7.5 mg by mouth at bedtime as needed (anxiousness/rest).     mupirocin  ointment (BACTROBAN ) 2 % Apply 1 application. topically 2 (two) times daily. 22 g 2   Omeprazole 20 MG TBDD Take by mouth.     pantoprazole  (PROTONIX ) 40 MG tablet Take 40 mg by mouth 2 (two) times a day.     potassium chloride  (KLOR-CON ) 10 MEQ tablet Take 10 mEq by mouth daily.     telmisartan (MICARDIS) 80 MG tablet Take 80 mg by mouth daily.     triamcinolone  cream (KENALOG ) 0.1 % SMARTSIG:1 Application Topical 2-3 Times Daily     triamcinolone  ointment (KENALOG ) 0.1 % Apply to legs twice daily for up to 2 weeks as needed. Avoid applying to face, groin, and axilla. Use as directed. Long-term use can cause thinning of the skin. 30 g 1   vitamin B-12 (CYANOCOBALAMIN ) 1000 MCG tablet Take 1,000 mcg by mouth daily.     sulfamethoxazole -trimethoprim  (BACTRIM  DS) 800-160 MG tablet Take 1 tablet by mouth as directed. Take 1 tablet by mouth every 12 hours for 7 days (Patient not taking: Reported on 03/26/2024) 14 tablet 0   No current facility-administered medications for this visit.     PHYSICAL EXAMINATION:   Vitals:   03/26/24 1049  BP: (!) 104/56  Pulse: 79  Resp: 18  Temp: 97.6 F (36.4 C)  SpO2: 96%   Filed Weights   03/26/24 1049  Weight: 178 lb 6.4 oz (80.9 kg)    Physical Exam Vitals and nursing note reviewed.  HENT:     Head: Normocephalic and atraumatic.     Mouth/Throat:     Pharynx: Oropharynx is clear.   Eyes:     Extraocular Movements: Extraocular movements intact.     Pupils: Pupils are equal, round, and reactive to light.    Cardiovascular:     Rate and Rhythm: Normal rate and regular rhythm.  Pulmonary:     Comments: Decreased breath sounds bilaterally.  Abdominal:     Palpations: Abdomen is soft.    Musculoskeletal:        General: Normal range of motion.     Cervical back: Normal range of motion.   Skin:    General:  Skin is warm.   Neurological:     General: No focal deficit present.     Mental Status: He is alert and oriented to person, place, and time.   Psychiatric:        Behavior: Behavior normal.        Judgment: Judgment normal.      LABORATORY DATA:  I have reviewed the data as listed Lab Results  Component Value Date   WBC 3.8 (L) 10/27/2023   HGB 12.4 (L) 10/27/2023   HCT 38.2 (L) 10/27/2023   MCV 90.3 10/27/2023   PLT 90 (L) 10/27/2023   Recent Labs    10/27/23 1624  NA 140  K 3.7  CL 106  CO2 24  GLUCOSE 93  BUN 10  CREATININE 1.25*  CALCIUM  8.8*  GFRNONAA >60     No results found.  ASSESSMENT & PLAN:   Symptomatic anemia # I had a long discussion with the patient regarding etiology of unexplained anemia/thrombocytopenia/leukopenia.    # I had a long discussion with the patient regarding the possibility of the etiologies include -benign like nutritional/malabsorption, liver disease, chronic kidney disease viral infections, autoimmune; blood loss etc. 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  Possible MDS/primary bone marrow problem was discussed-and have recommended bone marrow biopsy for further evaluation.  # Discussed with the patient the bone marrow biopsy and aspiration indication and procedure at length.  Given significant discomfort involved-I would recommend under sedation/with interventional radiology. I discussed the potential complications include-bleeding/trauma and risk of infection; which are fortunately very rare.  Patient is in agreement. Patient will sign the consent prior to the procedure. Bone marrow biopsy/aspiration is ordered.   #  I recommend CBC CMP LDH haptoglobin B12 folic acid reticulocyte count platelet immature fraction; review of peripheral smear.  Iron studies;  ferritin.myeloma panel kappa lambda light chain ratio.   # ETIOLOGY: Unclear-autoimmune liver disease primary biliary [March 2024 status post liver biopsy-fibrosis no cirrhosis; [facility] on [facility]]; [facility]-March 2025-colonoscopyMRI- [facility] 2024- no cirrhosis/splenomegaly.   # OSA-CPAP- COPD [[facility]]- stable-   # A.fib on Eliquis  [Dr. Claudia Cuff; [facility]]-no evidence any bleeding monitor closely on platelet count around 90s.  # Rheumatoid arthritis- [Dr.Patel]-on hydroxychloroquine. Check CRP.   Thank you Dr.Patel for allowing me to participate in the care of your pleasant patient. Please do not hesitate to contact me with questions or concerns in the interim.  # DISPOSITION: # Hydrocholoquine - add to med list-  # labs today- ordered # bone marrow biopsy- asap please # follow up in appx 2 or so weeks AFTER BONE MARROW Biopsy-  MD; labs- cbc/HOLD tube-  possible venofer/ D-2 Possible  blood- Dr.B  # 45 minutes face-to-face with the patient discussing the above plan of care; more than 50% of time spent on counseling and coordination. My contact information was given; and all questions were answered. The patient knows to call the clinic with any problems, questions or concerns.    Gwyn Leos, MD 03/26/2024 12:18 PM

## 2024-03-26 NOTE — Assessment & Plan Note (Addendum)
#   I had a long discussion with the patient regarding etiology of unexplained anemia/thrombocytopenia/leukopenia.    # I had a long discussion with the patient regarding the possibility of the etiologies include -benign like nutritional/malabsorption, liver disease, chronic kidney disease viral infections, autoimmune; blood loss etc. 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  Possible MDS/primary bone marrow problem was discussed-and have recommended bone marrow biopsy for further evaluation.  # Discussed with the patient the bone marrow biopsy and aspiration indication and procedure at length.  Given significant discomfort involved-I would recommend under sedation/with interventional radiology. I discussed the potential complications include-bleeding/trauma and risk of infection; which are fortunately very rare.  Patient is in agreement. Patient will sign the consent prior to the procedure. Bone marrow biopsy/aspiration is ordered.   #  I recommend CBC CMP LDH haptoglobin B12 folic acid reticulocyte count platelet immature fraction; review of peripheral smear.  Iron studies; ferritin.myeloma panel kappa lambda light chain ratio.   # ETIOLOGY: Unclear-autoimmune liver disease primary biliary [March 2024 status post liver biopsy-fibrosis no cirrhosis; [facility] on [facility]]; [facility]-March 2025-colonoscopyMRI- [facility] 2024- no cirrhosis/splenomegaly.   # OSA-CPAP- COPD [[facility]]- stable-   # A.fib on Eliquis  [Dr. Claudia Cuff; [facility]]-no evidence any bleeding monitor closely on platelet count around 90s.  # Rheumatoid arthritis- [Dr.Patel]-on hydroxychloroquine. Check CRP.   Thank you Dr.Patel for allowing me to participate in the care of your pleasant patient. Please do not hesitate to contact me with questions or concerns in the interim.  # DISPOSITION: # Hydrocholoquine - add to med list-  # labs today- ordered # bone marrow biopsy- asap please # follow up in appx 2 or  so weeks AFTER BONE MARROW Biopsy-  MD; labs- cbc/HOLD tube-  possible venofer/ D-2 Possible  blood- Dr.B  # 45 minutes face-to-face with the patient discussing the above plan of care; more than 50% of time spent on counseling and coordination. My contact information was given; and all questions were answered. The patient knows to call the clinic with any problems, questions or concerns.  I spoke to patient that he is iron deficient-hemoglobin 8.7; platelets improved at 122-recommend starting Venofer at the earliest- weekly x 3 # Cancel bone marrow biopsy for now # Follow-up in 6 weeks-MD; labs CBC CMP; possible Venofer- Dr.B

## 2024-03-27 ENCOUNTER — Telehealth: Payer: Self-pay | Admitting: Internal Medicine

## 2024-03-27 ENCOUNTER — Telehealth: Payer: Self-pay | Admitting: *Deleted

## 2024-03-27 ENCOUNTER — Other Ambulatory Visit: Payer: Self-pay

## 2024-03-27 DIAGNOSIS — D649 Anemia, unspecified: Secondary | ICD-10-CM

## 2024-03-27 NOTE — Telephone Encounter (Signed)
 Labs entered.

## 2024-03-27 NOTE — Telephone Encounter (Signed)
 I spoke to patient that he is iron deficient-hemoglobin 8.7; platelets improved at 122  # -recommend starting Venofer at the earliest- weekly x 3- please schedule  # Cancel bone marrow biopsy for now  # Follow-up in 6 weeks-MD; labs CBC CMP; possible Venofer- Dr.B

## 2024-03-27 NOTE — Telephone Encounter (Signed)
 Phone call to patient; Pt has agreed to [facility] appt on June 25 th with arrival time of 8:30 am [facility]. Nothing to eat or drink after midnight. He will need a driver.

## 2024-03-28 LAB — MULTIPLE MYELOMA PANEL, SERUM
Albumin SerPl Elph-Mcnc: 3.3 g/dL (ref 2.9–4.4)
Albumin/Glob SerPl: 1.2 (ref 0.7–1.7)
Alpha 1: 0.3 g/dL (ref 0.0–0.4)
Alpha2 Glob SerPl Elph-Mcnc: 0.5 g/dL (ref 0.4–1.0)
B-Globulin SerPl Elph-Mcnc: 1.2 g/dL (ref 0.7–1.3)
Gamma Glob SerPl Elph-Mcnc: 0.8 g/dL (ref 0.4–1.8)
Globulin, Total: 2.8 g/dL (ref 2.2–3.9)
IgA: 489 mg/dL — ABNORMAL HIGH (ref 61–437)
IgG (Immunoglobin G), Serum: 821 mg/dL (ref 603–1613)
IgM (Immunoglobulin M), Srm: 148 mg/dL (ref 20–172)
Total Protein ELP: 6.1 g/dL (ref 6.0–8.5)

## 2024-03-28 LAB — KAPPA/LAMBDA LIGHT CHAINS
Kappa free light chain: 29 mg/L — ABNORMAL HIGH (ref 3.3–19.4)
Kappa, lambda light chain ratio: 0.83 (ref 0.26–1.65)
Lambda free light chains: 34.8 mg/L — ABNORMAL HIGH (ref 5.7–26.3)

## 2024-03-28 LAB — HAPTOGLOBIN: Haptoglobin: 126 mg/dL (ref 32–363)

## 2024-04-02 ENCOUNTER — Inpatient Hospital Stay

## 2024-04-02 VITALS — BP 106/56 | HR 73 | Temp 97.6°F | Resp 18

## 2024-04-02 DIAGNOSIS — D649 Anemia, unspecified: Secondary | ICD-10-CM | POA: Diagnosis not present

## 2024-04-02 MED ORDER — IRON SUCROSE 20 MG/ML IV SOLN
200.0000 mg | Freq: Once | INTRAVENOUS | Status: AC
  Administered 2024-04-02: 200 mg via INTRAVENOUS
  Filled 2024-04-02: qty 10

## 2024-04-02 NOTE — Patient Instructions (Signed)

## 2024-04-04 ENCOUNTER — Other Ambulatory Visit: Admitting: Radiology

## 2024-04-09 ENCOUNTER — Inpatient Hospital Stay

## 2024-04-09 VITALS — BP 109/58 | HR 64 | Temp 97.6°F | Resp 16

## 2024-04-09 DIAGNOSIS — D649 Anemia, unspecified: Secondary | ICD-10-CM

## 2024-04-09 MED ORDER — IRON SUCROSE 20 MG/ML IV SOLN
200.0000 mg | Freq: Once | INTRAVENOUS | Status: AC
  Administered 2024-04-09: 200 mg via INTRAVENOUS
  Filled 2024-04-09: qty 10

## 2024-04-16 ENCOUNTER — Inpatient Hospital Stay: Attending: Internal Medicine

## 2024-04-16 VITALS — BP 110/56 | HR 70 | Temp 97.5°F | Resp 16

## 2024-04-16 DIAGNOSIS — G4733 Obstructive sleep apnea (adult) (pediatric): Secondary | ICD-10-CM | POA: Diagnosis not present

## 2024-04-16 DIAGNOSIS — R5383 Other fatigue: Secondary | ICD-10-CM | POA: Diagnosis not present

## 2024-04-16 DIAGNOSIS — M069 Rheumatoid arthritis, unspecified: Secondary | ICD-10-CM | POA: Diagnosis not present

## 2024-04-16 DIAGNOSIS — Z85828 Personal history of other malignant neoplasm of skin: Secondary | ICD-10-CM | POA: Diagnosis not present

## 2024-04-16 DIAGNOSIS — Z87891 Personal history of nicotine dependence: Secondary | ICD-10-CM | POA: Insufficient documentation

## 2024-04-16 DIAGNOSIS — Z800 Family history of malignant neoplasm of digestive organs: Secondary | ICD-10-CM | POA: Diagnosis not present

## 2024-04-16 DIAGNOSIS — Z7901 Long term (current) use of anticoagulants: Secondary | ICD-10-CM | POA: Diagnosis not present

## 2024-04-16 DIAGNOSIS — I4891 Unspecified atrial fibrillation: Secondary | ICD-10-CM | POA: Insufficient documentation

## 2024-04-16 DIAGNOSIS — D72819 Decreased white blood cell count, unspecified: Secondary | ICD-10-CM | POA: Insufficient documentation

## 2024-04-16 DIAGNOSIS — D696 Thrombocytopenia, unspecified: Secondary | ICD-10-CM | POA: Insufficient documentation

## 2024-04-16 DIAGNOSIS — D649 Anemia, unspecified: Secondary | ICD-10-CM | POA: Diagnosis present

## 2024-04-16 DIAGNOSIS — G4489 Other headache syndrome: Secondary | ICD-10-CM | POA: Insufficient documentation

## 2024-04-16 MED ORDER — IRON SUCROSE 20 MG/ML IV SOLN
200.0000 mg | Freq: Once | INTRAVENOUS | Status: AC
  Administered 2024-04-16: 200 mg via INTRAVENOUS
  Filled 2024-04-16: qty 10

## 2024-05-10 ENCOUNTER — Encounter: Payer: Self-pay | Admitting: Internal Medicine

## 2024-05-10 ENCOUNTER — Telehealth: Payer: Self-pay | Admitting: *Deleted

## 2024-05-10 ENCOUNTER — Inpatient Hospital Stay: Admitting: Internal Medicine

## 2024-05-10 ENCOUNTER — Other Ambulatory Visit: Payer: Self-pay | Admitting: *Deleted

## 2024-05-10 ENCOUNTER — Inpatient Hospital Stay

## 2024-05-10 ENCOUNTER — Other Ambulatory Visit: Payer: Self-pay

## 2024-05-10 VITALS — BP 117/56 | HR 66

## 2024-05-10 VITALS — BP 119/66 | HR 71 | Temp 97.8°F | Resp 18 | Ht 67.0 in | Wt 181.9 lb

## 2024-05-10 DIAGNOSIS — D649 Anemia, unspecified: Secondary | ICD-10-CM | POA: Diagnosis not present

## 2024-05-10 LAB — CBC WITH DIFFERENTIAL (CANCER CENTER ONLY)
Abs Immature Granulocytes: 0 K/uL (ref 0.00–0.07)
Basophils Absolute: 0 K/uL (ref 0.0–0.1)
Basophils Relative: 1 %
Eosinophils Absolute: 0 K/uL (ref 0.0–0.5)
Eosinophils Relative: 1 %
HCT: 24.1 % — ABNORMAL LOW (ref 39.0–52.0)
Hemoglobin: 6.9 g/dL — CL (ref 13.0–17.0)
Immature Granulocytes: 0 %
Lymphocytes Relative: 30 %
Lymphs Abs: 0.7 K/uL (ref 0.7–4.0)
MCH: 22 pg — ABNORMAL LOW (ref 26.0–34.0)
MCHC: 28.6 g/dL — ABNORMAL LOW (ref 30.0–36.0)
MCV: 76.8 fL — ABNORMAL LOW (ref 80.0–100.0)
Monocytes Absolute: 0.3 K/uL (ref 0.1–1.0)
Monocytes Relative: 12 %
Neutro Abs: 1.3 K/uL — ABNORMAL LOW (ref 1.7–7.7)
Neutrophils Relative %: 56 %
Platelet Count: 103 K/uL — ABNORMAL LOW (ref 150–400)
RBC: 3.14 MIL/uL — ABNORMAL LOW (ref 4.22–5.81)
RDW: 16.9 % — ABNORMAL HIGH (ref 11.5–15.5)
WBC Count: 2.3 K/uL — ABNORMAL LOW (ref 4.0–10.5)
nRBC: 0 % (ref 0.0–0.2)

## 2024-05-10 LAB — IRON AND TIBC
Iron: 15 ug/dL — ABNORMAL LOW (ref 45–182)
Saturation Ratios: 4 % — ABNORMAL LOW (ref 17.9–39.5)
TIBC: 375 ug/dL (ref 250–450)
UIBC: 360 ug/dL

## 2024-05-10 LAB — CMP (CANCER CENTER ONLY)
ALT: 15 U/L (ref 0–44)
AST: 19 U/L (ref 15–41)
Albumin: 3.3 g/dL — ABNORMAL LOW (ref 3.5–5.0)
Alkaline Phosphatase: 181 U/L — ABNORMAL HIGH (ref 38–126)
Anion gap: 6 (ref 5–15)
BUN: 12 mg/dL (ref 8–23)
CO2: 22 mmol/L (ref 22–32)
Calcium: 8.5 mg/dL — ABNORMAL LOW (ref 8.9–10.3)
Chloride: 109 mmol/L (ref 98–111)
Creatinine: 0.98 mg/dL (ref 0.61–1.24)
GFR, Estimated: >60 mL/min (ref >60)
Glucose, Bld: 108 mg/dL — ABNORMAL HIGH (ref 70–99)
Potassium: 3.9 mmol/L (ref 3.5–5.1)
Sodium: 137 mmol/L (ref 135–145)
Total Bilirubin: 0.7 mg/dL (ref 0.0–1.2)
Total Protein: 6.1 g/dL — ABNORMAL LOW (ref 6.5–8.1)

## 2024-05-10 LAB — FERRITIN: Ferritin: 8 ng/mL — ABNORMAL LOW (ref 24–336)

## 2024-05-10 LAB — SAMPLE TO BLOOD BANK

## 2024-05-10 MED ORDER — SODIUM CHLORIDE 0.9% FLUSH
10.0000 mL | Freq: Once | INTRAVENOUS | Status: AC | PRN
  Administered 2024-05-10: 10 mL
  Filled 2024-05-10: qty 10

## 2024-05-10 MED ORDER — IRON SUCROSE 20 MG/ML IV SOLN
200.0000 mg | Freq: Once | INTRAVENOUS | Status: AC
  Administered 2024-05-10: 200 mg via INTRAVENOUS
  Filled 2024-05-10: qty 10

## 2024-05-10 NOTE — Telephone Encounter (Signed)
 I made contact with patient via telephone call. I explained to him that Dr. B spoke to his GI doctor, Locklear and his cardiologist, Paraschos. All in agreement that the risk outweighs the benefit of taking eliquis  as hemoglobin keeps dropping. Pt instructed to stop eliquis  for now. I spoke to him about a bone marrow biopsy. Pt states that is something he is not in favor of doing. WE will hold off scheduling the BM biopsy for now. He can discuss with Dr B at his next appt. Pt in agreement.

## 2024-05-10 NOTE — Progress Notes (Signed)
 Patient says he is concerned about blood work results from PCP. He also states he would like to get to the bottom of what is going on with his iron . He states since Dr. Tobie started him on Plaquenil, he has been dealing with these iron  issues.

## 2024-05-10 NOTE — Progress Notes (Signed)
 Patient tolerated Venofer  infusion well. Explained recommendation of 30 min post monitoring. Patient refused to wait post monitoring. Educated on what signs to watch for & to call with any concerns. No questions, discharged. Stable. AVS given

## 2024-05-10 NOTE — Progress Notes (Signed)
 [facility] CONSULT NOTE  Patient Care Team: [name], MD as PCP - General (Internal Medicine) [name], MD as Consulting Physician (Oncology)  CHIEF COMPLAINTS/PURPOSE OF CONSULTATION: ANEMIA   HEMATOLOGY HISTORY  # ANEMIA[Hb; MCV-platelets- WBC; Iron  sat; ferritin;  GFR- CT/US ; EGD/colonoscopy-   Latest Reference Range & Units 10/27/23 16:24  WBC 4.0 - 10.5 K/uL 3.8 (L)  RBC 4.22 - 5.81 MIL/uL 4.23  Hemoglobin 13.0 - 17.0 g/dL 87.5 (L)  HCT 60.9 - 47.9 % 38.2 (L)  MCV 80.0 - 100.0 fL 90.3  MCH 26.0 - 34.0 pg 29.3  MCHC 30.0 - 36.0 g/dL 67.4  RDW 88.4 - 84.4 % 13.6  Platelets 150 - 400 K/uL 90 (L)  (L): Data is abnormally low  HISTORY OF PRESENTING ILLNESS: Patient ambulating-independently .  Alone   Zeppelin Commisso Nieves 68 y.o.  male with  pleasant patient with history of COPD-OSA- CPAP- CAD/A.fib on eliquis ; PBC-no cirrhosis or hepatosplenomegaly is here for follow-up of anemia/thrombocytopenia.  On blood work patient noted to have iron  deficiency anemia.  Patient is currently patient s/p iron  infusions.  Patient noted to have some improvement of his energy levels.  However currently feels tired..  Patient denies any bleeding.  He denies any black-colored stool.  Patient has poor tolerance to oral iron  because of nausea.  Patient states that his liver numbers are improving while on ursodiol.    Review of Systems  Constitutional:  Positive for malaise/fatigue and weight loss. Negative for chills, diaphoresis and fever.  HENT:  Negative for nosebleeds and sore throat.   Eyes:  Negative for double vision.  Respiratory:  Negative for cough, hemoptysis, sputum production, shortness of breath and wheezing.   Cardiovascular:  Negative for chest pain, palpitations, orthopnea and leg swelling.  Gastrointestinal:  Negative for abdominal pain, blood in stool, constipation, diarrhea, heartburn, melena, nausea and vomiting.  Genitourinary:   Negative for dysuria, frequency and urgency.  Musculoskeletal:  Positive for back pain and joint pain.  Skin: Negative.  Negative for itching and rash.  Neurological:  Negative for dizziness, tingling, focal weakness, weakness and headaches.  Endo/Heme/Allergies:  Does not bruise/bleed easily.  Psychiatric/Behavioral:  Negative for depression. The patient is not nervous/anxious and does not have insomnia.      MEDICAL HISTORY:  Past Medical History:  Diagnosis Date   Actinic keratosis    Allergic state    Arthritis    Asthma    B12 deficiency    Basal cell carcinoma 03/11/2022   left calf, Mohs 04/20/22   BCC (basal cell carcinoma) 04/06/2022   left forearm, tx'd with EDC   Complication of anesthesia    Difficult to wake up with anesthesia from colonoscopy   COPD (chronic obstructive pulmonary disease) (HCC)    Coronary artery disease    Dysrhythmia    Edema    GERD (gastroesophageal reflux disease)    Headache    History of kidney stones    Hypertension    Palpitations    Pulmonary nodules    Sleep apnea    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 SCCis, hypertrophic. Mohs 04/19/2023    SURGICAL HISTORY: Past Surgical History:  Procedure Laterality Date   CHOLECYSTECTOMY     COLONOSCOPY     COLONOSCOPY WITH PROPOFOL  N/A 04/09/2016   Procedure: COLONOSCOPY WITH PROPOFOL ;  Surgeon: Gladis RAYMOND Mariner, MD;  Location[facility] ENDOSCOPY;  Service: Endoscopy;  Laterality: N/A;   COLONOSCOPY WITH PROPOFOL  N/A  01/05/2021   Procedure: COLONOSCOPY WITH PROPOFOL ;  Surgeon: Toledo, Ladell POUR, MD;  Location: [facility] ENDOSCOPY;  Service: Gastroenterology;  Laterality: N/A;   COLONOSCOPY WITH PROPOFOL  N/A 12/20/2022   Procedure: COLONOSCOPY WITH PROPOFOL ;  Surgeon: Maryruth Ole DASEN, MD;  Location: [facility] ENDOSCOPY;  Service: Endoscopy;  Laterality: N/A;   COLONOSCOPY WITH PROPOFOL  N/A 01/09/2024   Procedure: COLONOSCOPY WITH PROPOFOL ;  Surgeon: Maryruth Ole DASEN, MD;   Location: [facility] ENDOSCOPY;  Service: Endoscopy;  Laterality: N/A;   ESOPHAGOGASTRODUODENOSCOPY (EGD) WITH PROPOFOL  N/A 01/05/2021   Procedure: ESOPHAGOGASTRODUODENOSCOPY (EGD) WITH PROPOFOL ;  Surgeon: Toledo, Ladell POUR, MD;  Location: [facility] ENDOSCOPY;  Service: Gastroenterology;  Laterality: N/A;   ESOPHAGOGASTRODUODENOSCOPY (EGD) WITH PROPOFOL  N/A 12/20/2022   Procedure: ESOPHAGOGASTRODUODENOSCOPY (EGD) WITH PROPOFOL ;  Surgeon: Maryruth Ole DASEN, MD;  Location: [facility] ENDOSCOPY;  Service: Endoscopy;  Laterality: N/A;   HEMOSTASIS CLIP PLACEMENT  01/09/2024   Procedure: CONTROL OF HEMORRHAGE, GI TRACT, ENDOSCOPIC, BY CLIPPING OR OVERSEWING;  Surgeon: Maryruth Ole DASEN, MD;  Location: [facility] ENDOSCOPY;  Service: Endoscopy;;   TEE WITHOUT CARDIOVERSION N/A 02/21/2019   Procedure: TRANSESOPHAGEAL ECHOCARDIOGRAM (TEE);  Surgeon: Hester Wolm PARAS, MD;  Location: [facility] ORS;  Service: Cardiovascular;  Laterality: N/A;    SOCIAL HISTORY: Social History   Socioeconomic History   Marital status: Married    Spouse name: Not on file   Number of children: Not on file   Years of education: Not on file   Highest education level: Not on file  Occupational History   Not on file  Tobacco Use   Smoking status: Former    Current packs/day: 0.00    Average packs/day: 1.5 packs/day for 46.0 years (69.0 ttl pk-yrs)    Types: Cigarettes    Start date: 09/02/1971    Quit date: 09/01/2017    Years since quitting: 6.6   Smokeless tobacco: Never  Vaping Use   Vaping status: Never Used  Substance and Sexual Activity   Alcohol use: No   Drug use: No   Sexual activity: Yes    Birth control/protection: None  Other Topics Concern   Not on file  Social History Narrative   46 years smoking; quit 2016; no alcohol; lives in [place]. Careers adviser. 1 living daughter-; 5 mins away.    Social Drivers of Corporate investment banker Strain: Low Risk  (11/09/2023)   Received from [facility]   Overall  Financial Resource Strain (CARDIA)    Difficulty of Paying Living Expenses: Not hard at all  Food Insecurity: No Food Insecurity (03/26/2024)   Hunger Vital Sign    Worried About Running Out of Food in the Last Year: Never true    Ran Out of Food in the Last Year: Never true  Transportation Needs: No Transportation Needs (03/26/2024)   PRAPARE - Administrator, Civil Service (Medical): No    Lack of Transportation (Non-Medical): No  Physical Activity: Not on file  Stress: Not on file  Social Connections: Not on file  Intimate Partner Violence: Not At Risk (03/26/2024)   Humiliation, Afraid, Rape, and Kick questionnaire    Fear of Current or Ex-Partner: No    Emotionally Abused: No    Physically Abused: No    Sexually Abused: No    FAMILY HISTORY: Family History  Problem Relation Age of Onset   Colon cancer Father 41       died of cancer   Stomach cancer Father    Cancer - Colon Father  Hypertension Mother    Prostate cancer Neg Hx    Bladder Cancer Neg Hx    Kidney cancer Neg Hx     ALLERGIES:  is allergic to amlodipine and codeine.  MEDICATIONS:  Current Outpatient Medications  Medication Sig Dispense Refill   ursodiol (ACTIGALL) 300 MG capsule Take 600 mg by mouth.     ADVAIR DISKUS 250-50 MCG/DOSE AEPB SMARTSIG:1 Inhalation Via Inhaler Every 12 Hours     albuterol  (PROVENTIL  HFA;VENTOLIN  HFA) 108 (90 Base) MCG/ACT inhaler Inhale 2 puffs into the lungs every 6 (six) hours as needed for wheezing or shortness of breath.     Cholecalciferol  (VITAMIN D3) 50 MCG (2000 UT) capsule Take 2,000 Units by mouth daily.      ELIQUIS  5 MG TABS tablet Take 5 mg by mouth 2 (two) times daily.     ferrous sulfate 325 (65 FE) MG EC tablet Take by mouth.     gentamicin  ointment (GARAMYCIN ) 0.1 % Apply 1 Application topically daily. And cover with bandage 15 g 1   hydroxychloroquine (PLAQUENIL) 200 MG tablet Take 200 mg by mouth 2 (two) times daily.     INCRUSE ELLIPTA  62.5  MCG/INH AEPB Inhale 1 puff into the lungs daily.     mirtazapine  (REMERON ) 7.5 MG tablet Take 7.5 mg by mouth at bedtime as needed (anxiousness/rest).     mupirocin  ointment (BACTROBAN ) 2 % Apply 1 application. topically 2 (two) times daily. 22 g 2   Omeprazole 20 MG TBDD Take by mouth.     pantoprazole  (PROTONIX ) 40 MG tablet Take 40 mg by mouth 2 (two) times a day.     potassium chloride  (KLOR-CON ) 10 MEQ tablet Take 10 mEq by mouth daily.     sulfamethoxazole -trimethoprim  (BACTRIM  DS) 800-160 MG tablet Take 1 tablet by mouth as directed. Take 1 tablet by mouth every 12 hours for 7 days (Patient not taking: Reported on 03/26/2024) 14 tablet 0   telmisartan (MICARDIS) 80 MG tablet Take 80 mg by mouth daily.     triamcinolone  cream (KENALOG ) 0.1 % SMARTSIG:1 Application Topical 2-3 Times Daily     triamcinolone  ointment (KENALOG ) 0.1 % Apply to legs twice daily for up to 2 weeks as needed. Avoid applying to face, groin, and axilla. Use as directed. Long-term use can cause thinning of the skin. 30 g 1   vitamin B-12 (CYANOCOBALAMIN ) 1000 MCG tablet Take 1,000 mcg by mouth daily.     No current facility-administered medications for this visit.     PHYSICAL EXAMINATION:   Vitals:   05/10/24 0958  BP: 119/66  Pulse: 71  Resp: 18  Temp: 97.8 F (36.6 C)  SpO2: 100%    Filed Weights   05/10/24 0958  Weight: 181 lb 14.4 oz (82.5 kg)     Physical Exam Vitals and nursing note reviewed.  HENT:     Head: Normocephalic and atraumatic.     Mouth/Throat:     Pharynx: Oropharynx is clear.  Eyes:     Extraocular Movements: Extraocular movements intact.     Pupils: Pupils are equal, round, and reactive to light.  Cardiovascular:     Rate and Rhythm: Normal rate and regular rhythm.  Pulmonary:     Comments: Decreased breath sounds bilaterally.  Abdominal:     Palpations: Abdomen is soft.  Musculoskeletal:        General: Normal range of motion.     Cervical back: Normal range of  motion.  Skin:    General: Skin is  warm.  Neurological:     General: No focal deficit present.     Mental Status: He is alert and oriented to person, place, and time.  Psychiatric:        Behavior: Behavior normal.        Judgment: Judgment normal.      LABORATORY DATA:  I have reviewed the data as listed Lab Results  Component Value Date   WBC 2.3 (L) 05/10/2024   HGB 6.9 (LL) 05/10/2024   HCT 24.1 (L) 05/10/2024   MCV 76.8 (L) 05/10/2024   PLT 103 (L) 05/10/2024   Recent Labs    10/27/23 1624 03/26/24 1212 05/10/24 0931  NA 140 135 137  K 3.7 4.1 3.9  CL 106 105 109  CO2 24 22 22   GLUCOSE 93 101* 108*  BUN 10 15 12   CREATININE 1.25* 1.07 0.98  CALCIUM  8.8* 8.5* 8.5*  GFRNONAA >60 >60 >60  PROT  --  6.9 6.1*  ALBUMIN  --  3.6 3.3*  AST  --  19 19  ALT  --  14 15  ALKPHOS  --  205* 181*  BILITOT  --  1.1 0.7     No results found.  ASSESSMENT & PLAN:   Symptomatic anemia # Iron  deficient anemia-June 2025-saturation 5% ferritin 17-hemoglobin 8.7- myeloma panel kappa lambda light chain ratio- WNL.  Status post iron  infusion-   # However status post iron  infusions x 3-hemoglobin today 6.9.  Patient is clinically stable-proceed with IV Venofer  weekly.  # Mild moderate thrombocytopenia/leukopenia- ? Related to autoimmune- ? RA-liver disease [No Hx of cirrhosis-recommend proceeding with bone marrow biopsy-especially given the multiple cytopenias.  # ETIOLOGY: Unclear-autoimmune liver disease primary biliary [March 2024 status post liver biopsy-fibrosis no cirrhosis; [facility] on [facility]]; [facility]-March 2025-colonoscopyMRI- [facility] 2024- no cirrhosis/splenomegaly.  Discussed with GI proceed with  Capsule.   # A.fib on Eliquis  [Dr. Alwyn; [facility]- [facility]]-given the ongoing iron  deficiency-suspect chronic GI bleed.  Discussed with cardiology-given the severe anemia recommend holding Eliquis  until stabilization of hemoglobin.  # Rheumatoid arthritis- [Dr.Patel]-on  hydroxychloroquine.  Reviewed with the patient that hydroxychloroquine unlikely culprit.  # OSA-CPAP- COPD [[facility]]- stable-   # DISPOSITION: # add iron  studies/ferritin today # venofer - today # venofer  weekly x 2 more-  # follow up in appx 3 weeks- APP- MD; labs- cbc/HOLD tube-  possible venofer / D-2 Possible  blood- Dr.B  Addendum: Discussed with GI -Dr. Maryruth ; and also cardiology Dr. Ammon.  Given the risk versus benefits -recommend holding Eliquis  for now.  Would recommend bone marrow biopsy.  Also capsule study as per GI.  I called the patient to discuss above-unable to reach voicemail.  Please order bone marrow biopsy. Change follow up to MD; [name] please inform the patient of member recommendations- GB     Cindy JONELLE Joe, MD 05/10/2024 1:44 PM

## 2024-05-10 NOTE — Patient Instructions (Signed)

## 2024-05-10 NOTE — Assessment & Plan Note (Addendum)
#   Iron  deficient anemia-June 2025-saturation 5% ferritin 17-hemoglobin 8.7- myeloma panel kappa lambda light chain ratio- WNL.  Status post iron  infusion-   # However status post iron  infusions x 3-hemoglobin today 6.9.  Patient is clinically stable-proceed with IV Venofer  weekly.  # Mild moderate thrombocytopenia/leukopenia- ? Related to autoimmune- ? RA-liver disease [No Hx of cirrhosis-recommend proceeding with bone marrow biopsy-especially given the multiple cytopenias.  # ETIOLOGY: Unclear-autoimmune liver disease primary biliary [March 2024 status post liver biopsy-fibrosis no cirrhosis; [facility] on [facility]]; [facility]-March 2025-colonoscopyMRI- [facility] 2024- no cirrhosis/splenomegaly.  Discussed with GI proceed with  Capsule.   # A.fib on Eliquis  [Dr. Alwyn; [facility]- [facility]]-given the ongoing iron  deficiency-suspect chronic GI bleed.  Discussed with cardiology-given the severe anemia recommend holding Eliquis  until stabilization of hemoglobin.  # Rheumatoid arthritis- [Dr.Patel]-on hydroxychloroquine.  Reviewed with the patient that hydroxychloroquine unlikely culprit.  # OSA-CPAP- COPD [[facility]-Pul]- stable-   # DISPOSITION: # add iron  studies/ferritin today # venofer - today # venofer  weekly x 2 more-  # follow up in appx 3 weeks- APP- MD; labs- cbc/HOLD tube-  possible venofer / D-2 Possible  blood- Dr.B  Addendum: Discussed with GI -Dr. Maryruth ; and also cardiology Dr. Ammon.  Given the risk versus benefits -recommend holding Eliquis  for now.  Would recommend bone marrow biopsy.  Also capsule study as per GI.  I called the patient to discuss above-unable to reach voicemail.  Please order bone marrow biopsy. Change follow up to MD; [name] please inform the patient of member recommendations- GB

## 2024-05-10 NOTE — Progress Notes (Signed)
 Critical lab called by [facility]. Hgb 6.9. Readback. MD notified.

## 2024-05-14 ENCOUNTER — Inpatient Hospital Stay: Attending: Internal Medicine

## 2024-05-14 VITALS — BP 120/61 | HR 76 | Temp 98.0°F | Resp 16

## 2024-05-14 DIAGNOSIS — D649 Anemia, unspecified: Secondary | ICD-10-CM | POA: Insufficient documentation

## 2024-05-14 MED ORDER — IRON SUCROSE 20 MG/ML IV SOLN
200.0000 mg | Freq: Once | INTRAVENOUS | Status: AC
  Administered 2024-05-14: 200 mg via INTRAVENOUS
  Filled 2024-05-14: qty 10

## 2024-05-21 ENCOUNTER — Ambulatory Visit

## 2024-05-29 ENCOUNTER — Inpatient Hospital Stay (HOSPITAL_BASED_OUTPATIENT_CLINIC_OR_DEPARTMENT_OTHER): Admitting: Nurse Practitioner

## 2024-05-29 ENCOUNTER — Inpatient Hospital Stay

## 2024-05-29 ENCOUNTER — Telehealth: Payer: Self-pay | Admitting: *Deleted

## 2024-05-29 VITALS — BP 116/58 | HR 71 | Temp 97.8°F | Resp 20 | Wt 181.3 lb

## 2024-05-29 DIAGNOSIS — D509 Iron deficiency anemia, unspecified: Secondary | ICD-10-CM | POA: Diagnosis not present

## 2024-05-29 DIAGNOSIS — D649 Anemia, unspecified: Secondary | ICD-10-CM

## 2024-05-29 DIAGNOSIS — D696 Thrombocytopenia, unspecified: Secondary | ICD-10-CM | POA: Diagnosis not present

## 2024-05-29 DIAGNOSIS — J449 Chronic obstructive pulmonary disease, unspecified: Secondary | ICD-10-CM | POA: Insufficient documentation

## 2024-05-29 DIAGNOSIS — D709 Neutropenia, unspecified: Secondary | ICD-10-CM

## 2024-05-29 LAB — CBC WITH DIFFERENTIAL (CANCER CENTER ONLY)
Abs Immature Granulocytes: 0 K/uL (ref 0.00–0.07)
Basophils Absolute: 0 K/uL (ref 0.0–0.1)
Basophils Relative: 1 %
Eosinophils Absolute: 0 K/uL (ref 0.0–0.5)
Eosinophils Relative: 2 %
HCT: 26.7 % — ABNORMAL LOW (ref 39.0–52.0)
Hemoglobin: 7.7 g/dL — ABNORMAL LOW (ref 13.0–17.0)
Immature Granulocytes: 0 %
Lymphocytes Relative: 23 %
Lymphs Abs: 0.5 K/uL — ABNORMAL LOW (ref 0.7–4.0)
MCH: 22.1 pg — ABNORMAL LOW (ref 26.0–34.0)
MCHC: 28.8 g/dL — ABNORMAL LOW (ref 30.0–36.0)
MCV: 76.7 fL — ABNORMAL LOW (ref 80.0–100.0)
Monocytes Absolute: 0.3 K/uL (ref 0.1–1.0)
Monocytes Relative: 13 %
Neutro Abs: 1.4 K/uL — ABNORMAL LOW (ref 1.7–7.7)
Neutrophils Relative %: 61 %
Platelet Count: 113 K/uL — ABNORMAL LOW (ref 150–400)
RBC: 3.48 MIL/uL — ABNORMAL LOW (ref 4.22–5.81)
RDW: 18.4 % — ABNORMAL HIGH (ref 11.5–15.5)
WBC Count: 2.3 K/uL — ABNORMAL LOW (ref 4.0–10.5)
nRBC: 0 % (ref 0.0–0.2)

## 2024-05-29 LAB — SAMPLE TO BLOOD BANK

## 2024-05-29 MED ORDER — IRON SUCROSE 20 MG/ML IV SOLN
200.0000 mg | Freq: Once | INTRAVENOUS | Status: AC
  Administered 2024-05-29: 200 mg via INTRAVENOUS
  Filled 2024-05-29: qty 10

## 2024-05-29 MED ORDER — OXYCODONE HCL 5 MG PO TABS: 5.0000 mg | ORAL_TABLET | Freq: Four times a day (QID) | ORAL | 0 refills | Status: DC | PRN

## 2024-05-29 NOTE — Patient Instructions (Signed)
 Derrick Sheppard

## 2024-05-29 NOTE — Progress Notes (Signed)
 [facility] CONSULT NOTE  Patient Care Team: Derrick Sheppard, Derrick as PCP - General (Internal Medicine) [name], Derrick as Consulting Physician (Oncology)  CHIEF COMPLAINTS/PURPOSE OF CONSULTATION: ANEMIA   HEMATOLOGY HISTORY  # ANEMIA [Hb; MCV-platelets- WBC; Iron  sat; ferritin;  GFR- CT/US ; EGD/colonoscopy-   Latest Reference Range & Units 10/27/23 16:24  WBC 4.0 - 10.5 K/uL 3.8 (L)  RBC 4.22 - 5.81 MIL/uL 4.23  Hemoglobin 13.0 - 17.0 g/dL 87.5 (L)  HCT 60.9 - 47.9 % 38.2 (L)  MCV 80.0 - 100.0 fL 90.3  MCH 26.0 - 34.0 pg 29.3  MCHC 30.0 - 36.0 g/dL 67.4  RDW 88.4 - 84.4 % 13.6  Platelets 150 - 400 K/uL 90 (L)  (L): Data is abnormally low  HISTORY OF PRESENTING ILLNESS: Patient ambulating-independently .  Alone   Derrick Sheppard 68 y.o. male pleasant patient with history of COPD-OSA- CPAP- CAD/A.fib on eliquis ; PBC-no cirrhosis or hepatosplenomegaly, is here for follow-up of anemia/thrombocytopenia.  On blood work patient noted to have iron  deficiency anemia.  Patient is currently patient s/p iron  infusions, last on 05/14/24. Energy improved after most recent infusion. Continues to feel tired. Eliquis  has been held. He's worried about doing bone marrow biopsy and pain after. Not on oral iron  d/t GI upset. Feels short of breath when exerting himself but somewhat improved.     Review of Systems  Constitutional:  Positive for malaise/fatigue. Negative for chills, diaphoresis, fever and weight loss.  HENT:  Negative for nosebleeds and sore throat.   Eyes:  Negative for double vision.  Respiratory:  Positive for shortness of breath. Negative for cough, hemoptysis, sputum production and wheezing.   Cardiovascular:  Negative for chest pain, palpitations, orthopnea and leg swelling.  Gastrointestinal:  Negative for abdominal pain, blood in stool, constipation, diarrhea, heartburn, melena, nausea and vomiting.  Genitourinary:  Negative for dysuria, frequency  and urgency.  Musculoskeletal:  Positive for back pain and joint pain.  Skin: Negative.  Negative for itching and rash.  Neurological:  Negative for dizziness, tingling, focal weakness, weakness and headaches.  Endo/Heme/Allergies:  Does not bruise/bleed easily.  Psychiatric/Behavioral:  Negative for depression. The patient is not nervous/anxious and does not have insomnia.    MEDICAL HISTORY:  Past Medical History:  Diagnosis Date   Actinic keratosis    Allergic state    Arthritis    Asthma    B12 deficiency    Basal cell carcinoma 03/11/2022   left calf, Mohs 04/20/22   BCC (basal cell carcinoma) 04/06/2022   left forearm, tx'd with EDC   Complication of anesthesia    Difficult to wake up with anesthesia from colonoscopy   COPD (chronic obstructive pulmonary disease) (HCC)    Coronary artery disease    Dysrhythmia    Edema    GERD (gastroesophageal reflux disease)    Headache    History of kidney stones    Hypertension    Palpitations    Pulmonary nodules    Sleep apnea    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 SCCis, hypertrophic. Mohs 04/19/2023   SURGICAL HISTORY: Past Surgical History:  Procedure Laterality Date   CHOLECYSTECTOMY     COLONOSCOPY     COLONOSCOPY WITH PROPOFOL  N/A 04/09/2016   Procedure: COLONOSCOPY WITH PROPOFOL ;  Surgeon: Gladis RAYMOND Mariner, Derrick;  Location: [facility] ENDOSCOPY;  Service: Endoscopy;  Laterality: N/A;   COLONOSCOPY WITH PROPOFOL  N/A 01/05/2021   Procedure: COLONOSCOPY WITH PROPOFOL ;  Surgeon:  Toledo, Ladell POUR, Derrick;  Location: [facility] ENDOSCOPY;  Service: Gastroenterology;  Laterality: N/A;   COLONOSCOPY WITH PROPOFOL  N/A 12/20/2022   Procedure: COLONOSCOPY WITH PROPOFOL ;  Surgeon: Maryruth Ole DASEN, Derrick;  Location: [facility] ENDOSCOPY;  Service: Endoscopy;  Laterality: N/A;   COLONOSCOPY WITH PROPOFOL  N/A 01/09/2024   Procedure: COLONOSCOPY WITH PROPOFOL ;  Surgeon: Maryruth Ole DASEN, Derrick;  Location: [facility] ENDOSCOPY;  Service:  Endoscopy;  Laterality: N/A;   ESOPHAGOGASTRODUODENOSCOPY (EGD) WITH PROPOFOL  N/A 01/05/2021   Procedure: ESOPHAGOGASTRODUODENOSCOPY (EGD) WITH PROPOFOL ;  Surgeon: Toledo, Ladell POUR, Derrick;  Location: [facility] ENDOSCOPY;  Service: Gastroenterology;  Laterality: N/A;   ESOPHAGOGASTRODUODENOSCOPY (EGD) WITH PROPOFOL  N/A 12/20/2022   Procedure: ESOPHAGOGASTRODUODENOSCOPY (EGD) WITH PROPOFOL ;  Surgeon: Maryruth Ole DASEN, Derrick;  Location: [facility] ENDOSCOPY;  Service: Endoscopy;  Laterality: N/A;   HEMOSTASIS CLIP PLACEMENT  01/09/2024   Procedure: CONTROL OF HEMORRHAGE, GI TRACT, ENDOSCOPIC, BY CLIPPING OR OVERSEWING;  Surgeon: Maryruth Ole DASEN, Derrick;  Location: [facility] ENDOSCOPY;  Service: Endoscopy;;   TEE WITHOUT CARDIOVERSION N/A 02/21/2019   Procedure: TRANSESOPHAGEAL ECHOCARDIOGRAM (TEE);  Surgeon: Hester Wolm PARAS, Derrick;  Location: [facility] ORS;  Service: Cardiovascular;  Laterality: N/A;   SOCIAL HISTORY: Social History   Socioeconomic History   Marital status: Married    Spouse name: Not on file   Number of children: Not on file   Years of education: Not on file   Highest education level: Not on file  Occupational History   Not on file  Tobacco Use   Smoking status: Former    Current packs/day: 0.00    Average packs/day: 1.5 packs/day for 46.0 years (69.0 ttl pk-yrs)    Types: Cigarettes    Start date: 09/02/1971    Quit date: 09/01/2017    Years since quitting: 6.7   Smokeless tobacco: Never  Vaping Use   Vaping status: Never Used  Substance and Sexual Activity   Alcohol use: No   Drug use: No   Sexual activity: Yes    Birth control/protection: None  Other Topics Concern   Not on file  Social History Narrative   46 years smoking; quit 2016; no alcohol; lives in [place]. Careers adviser. 1 living daughter-; 5 mins away.    Social Drivers of Corporate investment banker Strain: Low Risk  (11/09/2023)   Received [facility]   Overall Financial Resource Strain (CARDIA)     Difficulty of Paying Living Expenses: Not hard at all  Food Insecurity: No Food Insecurity (03/26/2024)   Hunger Vital Sign    Worried About Running Out of Food in the Last Year: Never true    Ran Out of Food in the Last Year: Never true  Transportation Needs: No Transportation Needs (03/26/2024)   PRAPARE - Administrator, Civil Service (Medical): No    Lack of Transportation (Non-Medical): No  Physical Activity: Not on file  Stress: Not on file  Social Connections: Not on file  Intimate Partner Violence: Not At Risk (03/26/2024)   Humiliation, Afraid, Rape, and Kick questionnaire    Fear of Current or Ex-Partner: No    Emotionally Abused: No    Physically Abused: No    Sexually Abused: No   FAMILY HISTORY: Family History  Problem Relation Age of Onset   Colon cancer Father 37       died of cancer   Stomach cancer Father    Cancer - Colon Father    Hypertension Mother    Prostate cancer Neg Hx  Bladder Cancer Neg Hx    Kidney cancer Neg Hx    ALLERGIES:  is allergic to amlodipine and codeine.  MEDICATIONS:  Current Outpatient Medications  Medication Sig Dispense Refill   ADVAIR DISKUS 250-50 MCG/DOSE AEPB SMARTSIG:1 Inhalation Via Inhaler Every 12 Hours     albuterol  (PROVENTIL  HFA;VENTOLIN  HFA) 108 (90 Base) MCG/ACT inhaler Inhale 2 puffs into the lungs every 6 (six) hours as needed for wheezing or shortness of breath.     Cholecalciferol  (VITAMIN D3) 50 MCG (2000 UT) capsule Take 2,000 Units by mouth daily.      hydroxychloroquine (PLAQUENIL) 200 MG tablet Take 200 mg by mouth 2 (two) times daily.     INCRUSE ELLIPTA  62.5 MCG/INH AEPB Inhale 1 puff into the lungs daily.     mirtazapine  (REMERON ) 7.5 MG tablet Take 7.5 mg by mouth at bedtime as needed (anxiousness/rest).     mupirocin  ointment (BACTROBAN ) 2 % Apply 1 application. topically 2 (two) times daily. 22 g 2   oxyCODONE  (OXY IR/ROXICODONE ) 5 MG immediate release tablet Take 1 tablet (5 mg total) by  mouth every 6 (six) hours as needed for severe pain (pain score 7-10). 5 tablet 0   pantoprazole  (PROTONIX ) 40 MG tablet Take 40 mg by mouth 2 (two) times a day.     potassium chloride  (KLOR-CON ) 10 MEQ tablet Take 10 mEq by mouth daily.     telmisartan (MICARDIS) 80 MG tablet Take 80 mg by mouth daily.     ursodiol (ACTIGALL) 300 MG capsule Take 600 mg by mouth.     vitamin B-12 (CYANOCOBALAMIN ) 1000 MCG tablet Take 1,000 mcg by mouth daily.     ELIQUIS  5 MG TABS tablet Take 5 mg by mouth 2 (two) times daily. (Patient not taking: Reported on 05/29/2024)     ferrous sulfate 325 (65 FE) MG EC tablet Take by mouth. (Patient not taking: Reported on 05/29/2024)     gentamicin  ointment (GARAMYCIN ) 0.1 % Apply 1 Application topically daily. And cover with bandage (Patient not taking: Reported on 05/29/2024) 15 g 1   Omeprazole 20 MG TBDD Take by mouth. (Patient not taking: Reported on 05/29/2024)     sulfamethoxazole -trimethoprim  (BACTRIM  DS) 800-160 MG tablet Take 1 tablet by mouth as directed. Take 1 tablet by mouth every 12 hours for 7 days (Patient not taking: Reported on 03/26/2024) 14 tablet 0   triamcinolone  cream (KENALOG ) 0.1 % SMARTSIG:1 Application Topical 2-3 Times Daily (Patient not taking: Reported on 05/29/2024)     triamcinolone  ointment (KENALOG ) 0.1 % Apply to legs twice daily for up to 2 weeks as needed. Avoid applying to face, groin, and axilla. Use as directed. Long-term use can cause thinning of the skin. (Patient not taking: Reported on 05/29/2024) 30 g 1   No current facility-administered medications for this visit.   Facility-Administered Medications Ordered in Other Visits  Medication Dose Route Frequency Provider Last Rate Last Admin   iron  sucrose (VENOFER ) injection 200 mg  200 mg Intravenous Once Brahmanday, Govinda R, Derrick       PHYSICAL EXAMINATION: Vitals:   05/29/24 1320  BP: (!) 116/58  Pulse: 71  Resp: 20  Temp: 97.8 F (36.6 C)  SpO2: 100%    Filed Weights    05/29/24 1320  Weight: 181 lb 4.8 oz (82.2 kg)    Physical Exam Vitals reviewed.  Constitutional:      Appearance: He is not ill-appearing.  HENT:     Head: Normocephalic and atraumatic.  Cardiovascular:  Rate and Rhythm: Normal rate and regular rhythm.  Pulmonary:     Comments: Decreased breath sounds bilaterally.  Abdominal:     Palpations: Abdomen is soft.     Tenderness: There is no guarding.  Musculoskeletal:        General: No deformity.  Skin:    General: Skin is warm.     Coloration: Skin is pale.  Neurological:     General: No focal deficit present.     Mental Status: He is alert and oriented to person, place, and time.  Psychiatric:        Mood and Affect: Mood is anxious.        Behavior: Behavior normal.     LABORATORY DATA:  I have reviewed the data as listed Lab Results  Component Value Date   WBC 2.3 (L) 05/29/2024   HGB 7.7 (L) 05/29/2024   HCT 26.7 (L) 05/29/2024   MCV 76.7 (L) 05/29/2024   PLT 113 (L) 05/29/2024   Recent Labs    10/27/23 1624 03/26/24 1212 05/10/24 0931  NA 140 135 137  K 3.7 4.1 3.9  CL 106 105 109  CO2 24 22 22   GLUCOSE 93 101* 108*  BUN 10 15 12   CREATININE 1.25* 1.07 0.98  CALCIUM  8.8* 8.5* 8.5*  GFRNONAA >60 >60 >60  PROT  --  6.9 6.1*  ALBUMIN  --  3.6 3.3*  AST  --  19 19  ALT  --  14 15  ALKPHOS  --  205* 181*  BILITOT  --  1.1 0.7   Iron /TIBC/Ferritin/ %Sat    Component Value Date/Time   IRON  15 (L) 05/10/2024 0931   TIBC 375 05/10/2024 0931   FERRITIN 8 (L) 05/10/2024 0931   IRONPCTSAT 4 (L) 05/10/2024 0931     No results found.  ASSESSMENT & PLAN:   Symptomatic anemia # Iron  deficient anemia-June 2025-saturation 5% ferritin 17-hemoglobin 8.7- myeloma panel kappa lambda light chain ratio- WNL.  Status post iron  infusion. S/p iron  infusions- hmg 6.9. Received venofer  05/14/24. Self discontinued oral iron  d/t GI upset. Hmg today 7.7. Ferritin 8, iron  sat 4%. Proceed with venofer  today. Plan for  weekly venofer .   # Etiology- unclear- autoimmune liver disease vs GI losses? autoimmune liver disease primary biliary [March 2024 status post liver biopsy-fibrosis no cirrhosis; [facility] on [facility]]; [facility]-March 2025-colonoscopy MRI- [facility] 2024- no cirrhosis/splenomegaly.  Discussed with GI/Dr Locklear proceed with capsule.     # Mild moderate thrombocytopenia/leukopenia- ? Related to autoimmune- ? RA-liver disease [No Hx of cirrhosis-recommend proceeding with bone marrow biopsy-especially given the multiple cytopenias. Patient declined BMBx. The risks, benefits, and alternatives of the procedure were explained. The risks include infection, pain, minimal blood loss, and discomfort following the procedure. The patient expresses understanding and asks appropriate questions. All questions regarding the procedure were answered. His biggest concern is pain and I will send him a prescription for oxycodone  to have on hand in case of pain.    # A.fib on Eliquis  [Dr. Ricke LAMY- Cardiology]-given the ongoing iron  deficiency-suspect chronic GI bleed.  Discussed with cardiology-given the severe anemia recommend holding Eliquis  until stabilization of hemoglobin. Eliquis  Stopped.    # Rheumatoid arthritis- [Dr.Patel]-on hydroxychloroquine. Hydroxychloroquine unlikely culprit of his anemia.   # OSA-CPAP- COPD [[facility]]- stable-    # DISPOSITION: Venofer  today No blood transfusion on 8/21 Venofer  weekly x 4 more  2 & 4 weeks- lab (cbc, hold tube) also 5 weeks- lab (cbc, cmp, hold tube, ferritin, iron  studies),  Dr Rennie, +/- venofer - la   No problem-specific Assessment & Plan notes found for this encounter.  Tinnie KANDICE Dawn, NP 05/29/2024   CC: Dr Rennie

## 2024-05-29 NOTE — Telephone Encounter (Signed)
 Message sent to Clarita Ricker to schedule a bone marrow aspiration.

## 2024-05-30 ENCOUNTER — Telehealth: Payer: Self-pay | Admitting: *Deleted

## 2024-05-30 ENCOUNTER — Encounter: Payer: Self-pay | Admitting: *Deleted

## 2024-05-30 NOTE — Telephone Encounter (Signed)
 Bone marrow Biopsy has been Scheduled for Weds Aug 27th arrival time 8:30 am . Nothing to eat or drink after midnight. He can have a sip of water  with meds. He will need a driver.Patient will be there.

## 2024-05-31 ENCOUNTER — Inpatient Hospital Stay

## 2024-05-31 ENCOUNTER — Other Ambulatory Visit: Payer: Self-pay | Admitting: Interventional Radiology

## 2024-05-31 DIAGNOSIS — Z01818 Encounter for other preprocedural examination: Secondary | ICD-10-CM

## 2024-06-04 ENCOUNTER — Other Ambulatory Visit: Payer: Self-pay | Admitting: *Deleted

## 2024-06-04 ENCOUNTER — Ambulatory Visit

## 2024-06-05 ENCOUNTER — Encounter: Payer: Self-pay | Admitting: Internal Medicine

## 2024-06-05 ENCOUNTER — Inpatient Hospital Stay

## 2024-06-05 ENCOUNTER — Other Ambulatory Visit (HOSPITAL_COMMUNITY): Payer: Self-pay | Admitting: Radiology

## 2024-06-05 VITALS — BP 122/72 | HR 72 | Temp 97.2°F | Resp 18

## 2024-06-05 DIAGNOSIS — D649 Anemia, unspecified: Secondary | ICD-10-CM

## 2024-06-05 MED ORDER — IRON SUCROSE 20 MG/ML IV SOLN
200.0000 mg | Freq: Once | INTRAVENOUS | Status: AC
  Administered 2024-06-05: 200 mg via INTRAVENOUS
  Filled 2024-06-05: qty 10

## 2024-06-05 NOTE — Progress Notes (Signed)
 Patient for IR Bone Marrow Biopsy on Wed 06/06/24, I called and spoke with the patient on the phone and gave pre-procedure instructions. Pt was made aware to be here at 8:30a, NPO after MN prior to procedure as well as driver post procedure/recovery/discharge. Pt stated understanding.  Called 06/05/24

## 2024-06-05 NOTE — Patient Instructions (Signed)

## 2024-06-06 ENCOUNTER — Ambulatory Visit
Admission: RE | Admit: 2024-06-06 | Discharge: 2024-06-06 | Disposition: A | Discharge status: Home / Self Care | Source: Ambulatory Visit | Attending: Nurse Practitioner | Admitting: Nurse Practitioner

## 2024-06-06 ENCOUNTER — Encounter: Payer: Self-pay | Admitting: Radiology

## 2024-06-06 DIAGNOSIS — Z01818 Encounter for other preprocedural examination: Secondary | ICD-10-CM

## 2024-06-06 DIAGNOSIS — Z87891 Personal history of nicotine dependence: Secondary | ICD-10-CM | POA: Insufficient documentation

## 2024-06-06 DIAGNOSIS — D696 Thrombocytopenia, unspecified: Secondary | ICD-10-CM | POA: Diagnosis not present

## 2024-06-06 DIAGNOSIS — D72819 Decreased white blood cell count, unspecified: Secondary | ICD-10-CM | POA: Diagnosis present

## 2024-06-06 DIAGNOSIS — D61818 Other pancytopenia: Secondary | ICD-10-CM | POA: Insufficient documentation

## 2024-06-06 DIAGNOSIS — D649 Anemia, unspecified: Secondary | ICD-10-CM | POA: Insufficient documentation

## 2024-06-06 DIAGNOSIS — D509 Iron deficiency anemia, unspecified: Secondary | ICD-10-CM

## 2024-06-06 HISTORY — PX: IR BONE MARROW BIOPSY & ASPIRATION: IMG5727

## 2024-06-06 LAB — CBC WITH DIFFERENTIAL/PLATELET
Abs Immature Granulocytes: 0 K/uL (ref 0.00–0.07)
Basophils Absolute: 0 K/uL (ref 0.0–0.1)
Basophils Relative: 1 %
Eosinophils Absolute: 0 K/uL (ref 0.0–0.5)
Eosinophils Relative: 2 %
HCT: 30 % — ABNORMAL LOW (ref 39.0–52.0)
Hemoglobin: 8.9 g/dL — ABNORMAL LOW (ref 13.0–17.0)
Immature Granulocytes: 0 %
Lymphocytes Relative: 23 %
Lymphs Abs: 0.4 K/uL — ABNORMAL LOW (ref 0.7–4.0)
MCH: 23.1 pg — ABNORMAL LOW (ref 26.0–34.0)
MCHC: 29.7 g/dL — ABNORMAL LOW (ref 30.0–36.0)
MCV: 77.9 fL — ABNORMAL LOW (ref 80.0–100.0)
Monocytes Absolute: 0.2 K/uL (ref 0.1–1.0)
Monocytes Relative: 11 %
Neutro Abs: 1.2 K/uL — ABNORMAL LOW (ref 1.7–7.7)
Neutrophils Relative %: 63 %
Platelets: 83 K/uL — ABNORMAL LOW (ref 150–400)
RBC: 3.85 MIL/uL — ABNORMAL LOW (ref 4.22–5.81)
RDW: 19.6 % — ABNORMAL HIGH (ref 11.5–15.5)
WBC: 1.8 K/uL — ABNORMAL LOW (ref 4.0–10.5)
nRBC: 0 % (ref 0.0–0.2)

## 2024-06-06 MED ORDER — FENTANYL CITRATE (PF) 100 MCG/2ML IJ SOLN
INTRAMUSCULAR | Status: AC | PRN
  Administered 2024-06-06 (×2): 25 ug via INTRAVENOUS
  Administered 2024-06-06: 50 ug via INTRAVENOUS

## 2024-06-06 MED ORDER — MIDAZOLAM HCL 2 MG/2ML IJ SOLN
INTRAMUSCULAR | Status: AC
  Filled 2024-06-06: qty 2

## 2024-06-06 MED ORDER — SODIUM CHLORIDE 0.9 % IV SOLN: INTRAVENOUS | Status: DC

## 2024-06-06 MED ORDER — FENTANYL CITRATE (PF) 100 MCG/2ML IJ SOLN
INTRAMUSCULAR | Status: AC
  Filled 2024-06-06: qty 2

## 2024-06-06 MED ORDER — MIDAZOLAM HCL 2 MG/2ML IJ SOLN
INTRAMUSCULAR | Status: AC | PRN
  Administered 2024-06-06: 1 mg via INTRAVENOUS
  Administered 2024-06-06 (×2): .5 mg via INTRAVENOUS

## 2024-06-06 MED ORDER — HEPARIN SOD (PORK) LOCK FLUSH 100 UNIT/ML IV SOLN
INTRAVENOUS | Status: AC
  Filled 2024-06-06: qty 5

## 2024-06-06 MED ORDER — LIDOCAINE 1 % OPTIME INJ - NO CHARGE
10.0000 mL | Freq: Once | INTRAMUSCULAR | Status: AC
  Administered 2024-06-06: 10 mL via INTRADERMAL
  Filled 2024-06-06: qty 10

## 2024-06-06 NOTE — Discharge Instructions (Signed)
 Bone Marrow Aspiration and Bone Marrow Biopsy, Adult, Care After This sheet gives you information about how to care for yourself after your procedure. If you have problems or questions, contact your health care provider.  What can I expect after the procedure?  After the procedure, it is common to have: Mild pain and tenderness. Swelling. Bruising.  Follow these instructions at home: Take over-the-counter or prescription medicines only as told by your health care provider. You may shower tomorrow Remove band aid tomorrow, replace with another bandaid if  site has any drainage from biopsy site. Wash your hands with soap and water before you touch your biopsy site  If soap and water are not available, use hand sanitizer. Change your dressing frequently for bleeding and/or drainage. Check your puncture site every day for signs of infection. Check for: More redness, swelling, or pain. More fluid or blood. Warmth. Pus or a bad smell. Return to your normal activities in 24hours.  Do not drive for 24 hours if you were given a medicine to help you relax (sedative). Keep all follow-up visits as told by your health care provider. This is important. Contact a health care provider if: You have more redness, swelling, or pain around the puncture site. You have more fluid or blood coming from the puncture site. Your puncture site feels warm to the touch. You have pus or a bad smell coming from the puncture site. You have a fever. Your pain is not controlled with medicine. This information is not intended to replace advice given to you by your health care provider. Make sure you discuss any questions you have with your health care provider. Document Released: 04/16/2005 Document Revised: 04/16/2016 Document Reviewed: 03/10/2016 Elsevier Interactive Patient Education  2018 [employer].

## 2024-06-06 NOTE — H&P (Signed)
 Chief Complaint: Patient was seen in consultation today for pancytopenia  Procedure: Bone Marrow Biopsy with aspiration  Referring Physician(s): Allen,Lauren G  Supervising Physician: Philip Cornet  Patient Status: [facility] - Out-pt  History of Present Illness: Derrick Sheppard is a 67 y.o. male with a history of COPD, CAD, and A-fib previously on Eliquis  who was referred to Hematology earlier this year for evaluation of anemia/thrombocytopenia. Patient underwent extensive lab workup which revealed worsening pancytopenia over the past few month. Patient reports undergoing Iron  transfusions due to low iron  levels. Denies any blood transfusions at this point. Reports worsening shortness of breath, dizziness/lightheadedness and fatigue over the past 2 months. Patient's Eliquis  was stopped approximately 2 weeks ago. Given the unclear etiology of patient's pancytopenia, he has been referred to IR for bone marrow biopsy.  He presents today with his wife and daughter. He admits to continued shortness of breath and fatigue. Denies any blood in stool, changes in weight, palpitations, or abdominal pain. NPO since midnight. All questions and concerns answered at the bedside.   Code Status: Full Code  Past Medical History:  Diagnosis Date   Actinic keratosis    Allergic state    Arthritis    Asthma    B12 deficiency    Basal cell carcinoma 03/11/2022   left calf, Mohs 04/20/22   BCC (basal cell carcinoma) 04/06/2022   left forearm, tx'd with EDC   Complication of anesthesia    Difficult to wake up with anesthesia from colonoscopy   COPD (chronic obstructive pulmonary disease) (HCC)    Coronary artery disease    Dysrhythmia    Edema    GERD (gastroesophageal reflux disease)    Headache    History of kidney stones    Hypertension    Palpitations    Pulmonary nodules    Sleep apnea    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 SCCis, hypertrophic. Mohs 04/19/2023     Past Surgical History:  Procedure Laterality Date   CHOLECYSTECTOMY     COLONOSCOPY     COLONOSCOPY WITH PROPOFOL  N/A 04/09/2016   Procedure: COLONOSCOPY WITH PROPOFOL ;  Surgeon: Gladis RAYMOND Mariner, MD;  Location: [facility] ENDOSCOPY;  Service: Endoscopy;  Laterality: N/A;   COLONOSCOPY WITH PROPOFOL  N/A 01/05/2021   Procedure: COLONOSCOPY WITH PROPOFOL ;  Surgeon: Toledo, Ladell POUR, MD;  Location: [facility] ENDOSCOPY;  Service: Gastroenterology;  Laterality: N/A;   COLONOSCOPY WITH PROPOFOL  N/A 12/20/2022   Procedure: COLONOSCOPY WITH PROPOFOL ;  Surgeon: Maryruth Ole DASEN, MD;  Location: [facility] ENDOSCOPY;  Service: Endoscopy;  Laterality: N/A;   COLONOSCOPY WITH PROPOFOL  N/A 01/09/2024   Procedure: COLONOSCOPY WITH PROPOFOL ;  Surgeon: Maryruth Ole DASEN, MD;  Location: [facility] ENDOSCOPY;  Service: Endoscopy;  Laterality: N/A;   ESOPHAGOGASTRODUODENOSCOPY (EGD) WITH PROPOFOL  N/A 01/05/2021   Procedure: ESOPHAGOGASTRODUODENOSCOPY (EGD) WITH PROPOFOL ;  Surgeon: Toledo, Ladell POUR, MD;  Location: [facility] ENDOSCOPY;  Service: Gastroenterology;  Laterality: N/A;   ESOPHAGOGASTRODUODENOSCOPY (EGD) WITH PROPOFOL  N/A 12/20/2022   Procedure: ESOPHAGOGASTRODUODENOSCOPY (EGD) WITH PROPOFOL ;  Surgeon: Maryruth Ole DASEN, MD;  Location: [facility] ENDOSCOPY;  Service: Endoscopy;  Laterality: N/A;   HEMOSTASIS CLIP PLACEMENT  01/09/2024   Procedure: CONTROL OF HEMORRHAGE, GI TRACT, ENDOSCOPIC, BY CLIPPING OR OVERSEWING;  Surgeon: Maryruth Ole DASEN, MD;  Location: [facility] ENDOSCOPY;  Service: Endoscopy;;   TEE WITHOUT CARDIOVERSION N/A 02/21/2019   Procedure: TRANSESOPHAGEAL ECHOCARDIOGRAM (TEE);  Surgeon: Hester Wolm PARAS, MD;  Location: [facility] ORS;  Service: Cardiovascular;  Laterality: N/A;    Allergies: Amlodipine and  Codeine  Medications: Prior to Admission medications   Medication Sig Start Date End Date Taking? Authorizing Provider  ADVAIR DISKUS 250-50 MCG/DOSE AEPB SMARTSIG:1 Inhalation Via Inhaler Every 12 Hours 07/09/20   Yes [provider]  albuterol  (PROVENTIL  HFA;VENTOLIN  HFA) 108 (90 Base) MCG/ACT inhaler Inhale 2 puffs into the lungs every 6 (six) hours as needed for wheezing or shortness of breath.   Yes [provider]  Cholecalciferol  (VITAMIN D3) 50 MCG (2000 UT) capsule Take 2,000 Units by mouth daily.    Yes [provider]  hydroxychloroquine (PLAQUENIL) 200 MG tablet Take 200 mg by mouth 2 (two) times daily.   Yes [provider]  INCRUSE ELLIPTA  62.5 MCG/INH AEPB Inhale 1 puff into the lungs daily. 07/17/20  Yes [provider]  pantoprazole  (PROTONIX ) 40 MG tablet Take 40 mg by mouth 2 (two) times a day. 01/27/19  Yes [provider]  potassium chloride  (KLOR-CON ) 10 MEQ tablet Take 10 mEq by mouth daily. 05/28/19  Yes [provider]  ursodiol (ACTIGALL) 300 MG capsule Take 600 mg by mouth. 03/20/24  Yes [provider]  vitamin B-12 (CYANOCOBALAMIN ) 1000 MCG tablet Take 1,000 mcg by mouth daily.   Yes [provider]  ELIQUIS  5 MG TABS tablet Take 5 mg by mouth 2 (two) times daily. Patient not taking: Reported on 05/29/2024 01/28/20   [provider]  ferrous sulfate 325 (65 FE) MG EC tablet Take by mouth. Patient not taking: Reported on 05/29/2024    [provider]  gentamicin  ointment (GARAMYCIN ) 0.1 % Apply 1 Application topically daily. And cover with bandage Patient not taking: Reported on 05/29/2024 04/14/22   Moye, Virginia , MD  mirtazapine  (REMERON ) 7.5 MG tablet Take 7.5 mg by mouth at bedtime as needed (anxiousness/rest).    [provider]  mupirocin  ointment (BACTROBAN ) 2 % Apply 1 application. topically 2 (two) times daily. 12/17/21   Hester Alm BROCKS, MD  Omeprazole 20 MG TBDD Take by mouth. Patient not taking: Reported on 05/29/2024    [provider]  oxyCODONE  (OXY IR/ROXICODONE ) 5 MG immediate release tablet Take 1 tablet (5 mg total) by mouth every 6 (six) hours as needed  for severe pain (pain score 7-10). 05/29/24   Dasie Tinnie MATSU, NP  sulfamethoxazole -trimethoprim  (BACTRIM  DS) 800-160 MG tablet Take 1 tablet by mouth as directed. Take 1 tablet by mouth every 12 hours for 7 days Patient not taking: Reported on 03/26/2024 04/08/22   Moye, Virginia , MD  telmisartan (MICARDIS) 80 MG tablet Take 80 mg by mouth daily. 12/21/18   [provider]  triamcinolone  cream (KENALOG ) 0.1 % SMARTSIG:1 Application Topical 2-3 Times Daily Patient not taking: Reported on 05/29/2024 05/11/21   [provider]  triamcinolone  ointment (KENALOG ) 0.1 % Apply to legs twice daily for up to 2 weeks as needed. Avoid applying to face, groin, and axilla. Use as directed. Long-term use can cause thinning of the skin. Patient not taking: Reported on 05/29/2024 09/08/22   Moye, Virginia , MD     Family History  Problem Relation Age of Onset   Colon cancer Father 7       died of cancer   Stomach cancer Father    Cancer - Colon Father    Hypertension Mother    Prostate cancer Neg Hx    Bladder Cancer Neg Hx    Kidney cancer Neg Hx     Social History   Socioeconomic History   Marital status: Married    Spouse  name: Not on file   Number of children: Not on file   Years of education: Not on file   Highest education level: Not on file  Occupational History   Not on file  Tobacco Use   Smoking status: Former    Current packs/day: 0.00    Average packs/day: 1.5 packs/day for 46.0 years (69.0 ttl pk-yrs)    Types: Cigarettes    Start date: 09/02/1971    Quit date: 09/01/2017    Years since quitting: 6.7   Smokeless tobacco: Never  Vaping Use   Vaping status: Never Used  Substance and Sexual Activity   Alcohol use: No   Drug use: No   Sexual activity: Yes    Birth control/protection: None  Other Topics Concern   Not on file  Social History Narrative   46 years smoking; quit 2016; no alcohol; lives in [place]. Careers adviser. 1 living daughter-; 5 mins away.     Social Drivers of Corporate investment banker Strain: Low Risk  (11/09/2023)   Received from [facility]   Overall Financial Resource Strain (CARDIA)    Difficulty of Paying Living Expenses: Not hard at all  Food Insecurity: No Food Insecurity (03/26/2024)   Hunger Vital Sign    Worried About Running Out of Food in the Last Year: Never true    Ran Out of Food in the Last Year: Never true  Transportation Needs: No Transportation Needs (03/26/2024)   PRAPARE - Administrator, Civil Service (Medical): No    Lack of Transportation (Non-Medical): No  Physical Activity: Not on file  Stress: Not on file  Social Connections: Not on file    Review of Systems  Constitutional:  Positive for fatigue.  Respiratory:  Positive for shortness of breath.   Cardiovascular:  Negative for palpitations.  Neurological:  Positive for dizziness and light-headedness.  Denies any N/V, chest pain, abdominal pain, fevers/chills, or melena. All other ROS negative.  Vital Signs: Pulse 76   Temp 97.8 F (36.6 C) (Oral)   Resp 15   Ht 5' 7 (1.702 m)   Wt 180 lb (81.6 kg)   SpO2 97%   BMI 28.19 kg/m    Physical Exam Constitutional:      Appearance: Normal appearance.  HENT:     Mouth/Throat:     Mouth: Mucous membranes are moist.     Pharynx: Oropharynx is clear.  Cardiovascular:     Rate and Rhythm: Normal rate and regular rhythm.     Heart sounds: Normal heart sounds.  Pulmonary:     Effort: Pulmonary effort is normal.     Comments: Diminished throughout Abdominal:     General: Abdomen is flat.     Palpations: Abdomen is soft.     Tenderness: There is no abdominal tenderness.  Skin:    General: Skin is warm and dry.  Neurological:     General: No focal deficit present.     Mental Status: He is alert and oriented to person, place, and time.  Psychiatric:        Mood and Affect: Mood normal.        Behavior: Behavior normal.     Imaging: No results  found.  Labs:  CBC: Recent Labs    03/26/24 1212 05/10/24 0931 05/29/24 1255 06/06/24 0904  WBC 3.7* 2.3* 2.3* 1.8*  HGB 8.7* 6.9* 7.7* 8.9*  HCT 29.1* 24.1* 26.7* 30.0*  PLT 126* 103* 113* 83*  COAGS: Recent Labs    10/27/23 1624  INR 1.1    BMP: Recent Labs    10/27/23 1624 03/26/24 1212 05/10/24 0931  NA 140 135 137  K 3.7 4.1 3.9  CL 106 105 109  CO2 24 22 22   GLUCOSE 93 101* 108*  BUN 10 15 12   CALCIUM  8.8* 8.5* 8.5*  CREATININE 1.25* 1.07 0.98  GFRNONAA >60 >60 >60    LIVER FUNCTION TESTS: Recent Labs    03/26/24 1212 05/10/24 0931  BILITOT 1.1 0.7  AST 19 19  ALT 14 15  ALKPHOS 205* 181*  PROT 6.9 6.1*  ALBUMIN 3.6 3.3*    TUMOR MARKERS: No results for input(s): AFPTM, CEA, CA199, CHROMGRNA in the last 8760 hours.  Assessment and Plan:  Pancytopenia: Derrick Sheppard is a 69 y.o. male with a history of anemia and thrombocytopenia worsening over the past several months who presents to [facility] Interventional Radiology department for an image-guided bone marrow biopsy with aspiration with Dr. DELENA Balder. Procedure to be performed under moderate sedation.  Risks and benefits of bone marrow biopsy was discussed with the patient and/or patient's family including, but not limited to bleeding, infection, damage to adjacent structures or low yield requiring additional tests.  All of the questions were answered and there is agreement to proceed.  Consent signed and in chart.   Thank you for this interesting consult. I greatly enjoyed meeting Derrick Sheppard and look forward to participating in their care. A copy of this report was sent to the requesting provider on this date.  Electronically Signed: Evin Loiseau M Vermelle Cammarata, PA-C 06/06/2024, 9:25 AM   I spent a total of  30 Minutes in face to face clinical consultation, greater than 50% of which was counseling/coordinating care for bone marrow biopsy with aspiration.

## 2024-06-06 NOTE — Procedures (Signed)
 Interventional Radiology Procedure:   Indications: Leukopenia, thrombocytopenia and iron  deficiency anemia   Procedure: Image guided bone marrow biopsy  Findings: 2 aspirates and 2 cores from right ilium  Complications: None     EBL: Minimal, less than 10 ml  Plan: Discharge to home in one hour.   Mckinzee Spirito R. Philip, MD  Pager: [phone]

## 2024-06-12 LAB — SURGICAL PATHOLOGY

## 2024-06-14 ENCOUNTER — Inpatient Hospital Stay

## 2024-06-14 ENCOUNTER — Encounter (HOSPITAL_COMMUNITY): Payer: Self-pay | Admitting: Internal Medicine

## 2024-06-14 ENCOUNTER — Inpatient Hospital Stay: Attending: Internal Medicine

## 2024-06-14 VITALS — BP 121/65 | HR 66 | Temp 97.8°F | Resp 17

## 2024-06-14 DIAGNOSIS — D509 Iron deficiency anemia, unspecified: Secondary | ICD-10-CM

## 2024-06-14 DIAGNOSIS — D696 Thrombocytopenia, unspecified: Secondary | ICD-10-CM

## 2024-06-14 DIAGNOSIS — D649 Anemia, unspecified: Secondary | ICD-10-CM | POA: Insufficient documentation

## 2024-06-14 DIAGNOSIS — D709 Neutropenia, unspecified: Secondary | ICD-10-CM

## 2024-06-14 LAB — CBC WITH DIFFERENTIAL (CANCER CENTER ONLY)
Abs Immature Granulocytes: 0.01 K/uL (ref 0.00–0.07)
Basophils Absolute: 0 K/uL (ref 0.0–0.1)
Basophils Relative: 1 %
Eosinophils Absolute: 0 K/uL (ref 0.0–0.5)
Eosinophils Relative: 1 %
HCT: 29.9 % — ABNORMAL LOW (ref 39.0–52.0)
Hemoglobin: 8.7 g/dL — ABNORMAL LOW (ref 13.0–17.0)
Immature Granulocytes: 1 %
Lymphocytes Relative: 25 %
Lymphs Abs: 0.5 K/uL — ABNORMAL LOW (ref 0.7–4.0)
MCH: 22.7 pg — ABNORMAL LOW (ref 26.0–34.0)
MCHC: 29.1 g/dL — ABNORMAL LOW (ref 30.0–36.0)
MCV: 77.9 fL — ABNORMAL LOW (ref 80.0–100.0)
Monocytes Absolute: 0.3 K/uL (ref 0.1–1.0)
Monocytes Relative: 15 %
Neutro Abs: 1.1 K/uL — ABNORMAL LOW (ref 1.7–7.7)
Neutrophils Relative %: 57 %
Platelet Count: 85 K/uL — ABNORMAL LOW (ref 150–400)
RBC: 3.84 MIL/uL — ABNORMAL LOW (ref 4.22–5.81)
RDW: 20 % — ABNORMAL HIGH (ref 11.5–15.5)
WBC Count: 1.9 K/uL — ABNORMAL LOW (ref 4.0–10.5)
nRBC: 0 % (ref 0.0–0.2)

## 2024-06-14 LAB — SAMPLE TO BLOOD BANK

## 2024-06-14 MED ORDER — SODIUM CHLORIDE 0.9% FLUSH
10.0000 mL | Freq: Once | INTRAVENOUS | Status: DC | PRN
  Filled 2024-06-14: qty 10

## 2024-06-14 MED ORDER — IRON SUCROSE 20 MG/ML IV SOLN
200.0000 mg | Freq: Once | INTRAVENOUS | Status: AC
  Administered 2024-06-14: 200 mg via INTRAVENOUS
  Filled 2024-06-14: qty 10

## 2024-06-14 NOTE — Patient Instructions (Signed)

## 2024-06-14 NOTE — Progress Notes (Signed)
 Patient tolerated Venofer  infusion well. Explained recommendation of 30 min post monitoring. Patient refused to wait post monitoring. Educated on what signs to watch for & to call with any concerns. No questions, discharged. Stable

## 2024-06-19 ENCOUNTER — Inpatient Hospital Stay

## 2024-06-19 VITALS — BP 113/59 | HR 71 | Temp 96.0°F | Resp 19

## 2024-06-19 DIAGNOSIS — D649 Anemia, unspecified: Secondary | ICD-10-CM | POA: Diagnosis not present

## 2024-06-19 MED ORDER — IRON SUCROSE 20 MG/ML IV SOLN
200.0000 mg | Freq: Once | INTRAVENOUS | Status: AC
  Administered 2024-06-19: 200 mg via INTRAVENOUS
  Filled 2024-06-19: qty 10

## 2024-06-19 MED ORDER — SODIUM CHLORIDE 0.9% FLUSH
10.0000 mL | Freq: Once | INTRAVENOUS | Status: AC | PRN
  Administered 2024-06-19: 10 mL
  Filled 2024-06-19: qty 10

## 2024-06-25 ENCOUNTER — Encounter (HOSPITAL_COMMUNITY): Payer: Self-pay | Admitting: Internal Medicine

## 2024-06-26 ENCOUNTER — Inpatient Hospital Stay

## 2024-06-26 VITALS — BP 116/63 | HR 71 | Temp 96.0°F | Resp 19

## 2024-06-26 DIAGNOSIS — D649 Anemia, unspecified: Secondary | ICD-10-CM | POA: Diagnosis not present

## 2024-06-26 DIAGNOSIS — D509 Iron deficiency anemia, unspecified: Secondary | ICD-10-CM

## 2024-06-26 DIAGNOSIS — D709 Neutropenia, unspecified: Secondary | ICD-10-CM

## 2024-06-26 DIAGNOSIS — D696 Thrombocytopenia, unspecified: Secondary | ICD-10-CM

## 2024-06-26 LAB — CBC WITH DIFFERENTIAL (CANCER CENTER ONLY)
Abs Immature Granulocytes: 0.01 K/uL (ref 0.00–0.07)
Basophils Absolute: 0 K/uL (ref 0.0–0.1)
Basophils Relative: 1 %
Eosinophils Absolute: 0 K/uL (ref 0.0–0.5)
Eosinophils Relative: 2 %
HCT: 34.9 % — ABNORMAL LOW (ref 39.0–52.0)
Hemoglobin: 10.3 g/dL — ABNORMAL LOW (ref 13.0–17.0)
Immature Granulocytes: 0 %
Lymphocytes Relative: 25 %
Lymphs Abs: 0.6 K/uL — ABNORMAL LOW (ref 0.7–4.0)
MCH: 23.7 pg — ABNORMAL LOW (ref 26.0–34.0)
MCHC: 29.5 g/dL — ABNORMAL LOW (ref 30.0–36.0)
MCV: 80.2 fL (ref 80.0–100.0)
Monocytes Absolute: 0.3 K/uL (ref 0.1–1.0)
Monocytes Relative: 11 %
Neutro Abs: 1.6 K/uL — ABNORMAL LOW (ref 1.7–7.7)
Neutrophils Relative %: 61 %
Platelet Count: 77 K/uL — ABNORMAL LOW (ref 150–400)
RBC: 4.35 MIL/uL (ref 4.22–5.81)
RDW: 21.4 % — ABNORMAL HIGH (ref 11.5–15.5)
WBC Count: 2.6 K/uL — ABNORMAL LOW (ref 4.0–10.5)
nRBC: 0 % (ref 0.0–0.2)

## 2024-06-26 LAB — CMP (CANCER CENTER ONLY)
ALT: 17 U/L (ref 0–44)
AST: 25 U/L (ref 15–41)
Albumin: 3.5 g/dL (ref 3.5–5.0)
Alkaline Phosphatase: 215 U/L — ABNORMAL HIGH (ref 38–126)
Anion gap: 5 (ref 5–15)
BUN: 12 mg/dL (ref 8–23)
CO2: 24 mmol/L (ref 22–32)
Calcium: 8.5 mg/dL — ABNORMAL LOW (ref 8.9–10.3)
Chloride: 107 mmol/L (ref 98–111)
Creatinine: 1.03 mg/dL (ref 0.61–1.24)
GFR, Estimated: >60 mL/min (ref >60)
Glucose, Bld: 107 mg/dL — ABNORMAL HIGH (ref 70–99)
Potassium: 3.8 mmol/L (ref 3.5–5.1)
Sodium: 136 mmol/L (ref 135–145)
Total Bilirubin: 1 mg/dL (ref 0.0–1.2)
Total Protein: 6.4 g/dL — ABNORMAL LOW (ref 6.5–8.1)

## 2024-06-26 LAB — IRON AND TIBC
Iron: 27 ug/dL — ABNORMAL LOW (ref 45–182)
Saturation Ratios: 8 % — ABNORMAL LOW (ref 17.9–39.5)
TIBC: 328 ug/dL (ref 250–450)
UIBC: 301 ug/dL

## 2024-06-26 LAB — SAMPLE TO BLOOD BANK

## 2024-06-26 LAB — FERRITIN: Ferritin: 48 ng/mL (ref 24–336)

## 2024-06-26 MED ORDER — IRON SUCROSE 20 MG/ML IV SOLN
200.0000 mg | Freq: Once | INTRAVENOUS | Status: AC
  Administered 2024-06-26: 200 mg via INTRAVENOUS
  Filled 2024-06-26: qty 10

## 2024-06-26 MED ORDER — SODIUM CHLORIDE 0.9% FLUSH
10.0000 mL | Freq: Once | INTRAVENOUS | Status: AC | PRN
  Administered 2024-06-26: 10 mL
  Filled 2024-06-26: qty 10

## 2024-07-02 ENCOUNTER — Other Ambulatory Visit: Payer: Self-pay | Admitting: *Deleted

## 2024-07-02 DIAGNOSIS — D649 Anemia, unspecified: Secondary | ICD-10-CM

## 2024-07-03 ENCOUNTER — Inpatient Hospital Stay

## 2024-07-03 ENCOUNTER — Inpatient Hospital Stay: Admitting: Internal Medicine

## 2024-07-03 ENCOUNTER — Encounter: Payer: Self-pay | Admitting: Internal Medicine

## 2024-07-03 VITALS — BP 130/61 | HR 69

## 2024-07-03 VITALS — BP 103/76 | HR 73 | Temp 98.7°F | Resp 20 | Ht 67.0 in | Wt 177.2 lb

## 2024-07-03 DIAGNOSIS — D649 Anemia, unspecified: Secondary | ICD-10-CM | POA: Diagnosis not present

## 2024-07-03 LAB — CBC WITH DIFFERENTIAL (CANCER CENTER ONLY)
Abs Immature Granulocytes: 0 K/uL (ref 0.00–0.07)
Basophils Absolute: 0 K/uL (ref 0.0–0.1)
Basophils Relative: 1 %
Eosinophils Absolute: 0 K/uL (ref 0.0–0.5)
Eosinophils Relative: 1 %
HCT: 35.8 % — ABNORMAL LOW (ref 39.0–52.0)
Hemoglobin: 10.8 g/dL — ABNORMAL LOW (ref 13.0–17.0)
Immature Granulocytes: 0 %
Lymphocytes Relative: 19 %
Lymphs Abs: 0.5 K/uL — ABNORMAL LOW (ref 0.7–4.0)
MCH: 24.3 pg — ABNORMAL LOW (ref 26.0–34.0)
MCHC: 30.2 g/dL (ref 30.0–36.0)
MCV: 80.6 fL (ref 80.0–100.0)
Monocytes Absolute: 0.4 K/uL (ref 0.1–1.0)
Monocytes Relative: 13 %
Neutro Abs: 2 K/uL (ref 1.7–7.7)
Neutrophils Relative %: 66 %
Platelet Count: 101 K/uL — ABNORMAL LOW (ref 150–400)
RBC: 4.44 MIL/uL (ref 4.22–5.81)
RDW: 21.2 % — ABNORMAL HIGH (ref 11.5–15.5)
WBC Count: 2.9 K/uL — ABNORMAL LOW (ref 4.0–10.5)
nRBC: 0 % (ref 0.0–0.2)

## 2024-07-03 LAB — CMP (CANCER CENTER ONLY)
ALT: 14 U/L (ref 0–44)
AST: 19 U/L (ref 15–41)
Albumin: 3.4 g/dL — ABNORMAL LOW (ref 3.5–5.0)
Alkaline Phosphatase: 231 U/L — ABNORMAL HIGH (ref 38–126)
Anion gap: 8 (ref 5–15)
BUN: 9 mg/dL (ref 8–23)
CO2: 23 mmol/L (ref 22–32)
Calcium: 8.4 mg/dL — ABNORMAL LOW (ref 8.9–10.3)
Chloride: 104 mmol/L (ref 98–111)
Creatinine: 0.98 mg/dL (ref 0.61–1.24)
GFR, Estimated: >60 mL/min (ref >60)
Glucose, Bld: 89 mg/dL (ref 70–99)
Potassium: 3.7 mmol/L (ref 3.5–5.1)
Sodium: 135 mmol/L (ref 135–145)
Total Bilirubin: 0.8 mg/dL (ref 0.0–1.2)
Total Protein: 6.3 g/dL — ABNORMAL LOW (ref 6.5–8.1)

## 2024-07-03 LAB — IRON AND TIBC
Iron: 28 ug/dL — ABNORMAL LOW (ref 45–182)
Saturation Ratios: 9 % — ABNORMAL LOW (ref 17.9–39.5)
TIBC: 314 ug/dL (ref 250–450)
UIBC: 286 ug/dL

## 2024-07-03 LAB — SAMPLE TO BLOOD BANK

## 2024-07-03 LAB — FERRITIN: Ferritin: 67 ng/mL (ref 24–336)

## 2024-07-03 MED ORDER — IRON SUCROSE 20 MG/ML IV SOLN
200.0000 mg | Freq: Once | INTRAVENOUS | Status: AC
  Administered 2024-07-03: 200 mg via INTRAVENOUS
  Filled 2024-07-03: qty 10

## 2024-07-03 MED ORDER — SODIUM CHLORIDE 0.9% FLUSH
10.0000 mL | Freq: Once | INTRAVENOUS | Status: AC | PRN
  Administered 2024-07-03: 10 mL
  Filled 2024-07-03: qty 10

## 2024-07-03 NOTE — Progress Notes (Signed)
 Fatigue/weakness: NO Dyspena: SOB/COPD Light headedness: NO Blood in stool: NO   Pt would like results of BMB done 06/06/24.

## 2024-07-03 NOTE — Assessment & Plan Note (Addendum)
#   Pancytopenia-with predominant anemia [progressively worse anemia since MAY 2024; since Jan 2025- Mild moderate thrombocytopenia/leukopenia-neutropenia/lymphopenia] BONE MARROW BIOPSY: AUG 2025- - Normocellular bone marrow with equivocal erythroid and megakaryocytic dyspoiesis  There is slight nuclear irregularity to the erythroid precursors; however, definitive dysplasia is not identified.  Likewise, the megakaryocytes show a mild amount of hyper lobulation, but overall they are adequate in number and without definitive morphologic evidence of dysplasia.  The myeloid series is unremarkable.  CD34 focally highlights minimal increased blasts (still under 5%) with focal minimal clusters (2-3) suggestive of but not definitive for abnormally localized immature precursors.  A hypercellularity is not present.  The overall findings could be reactive in nature however some of the findings are suspicious for possible MDS but not morphologically pathognomonic. NGS- NEGATIVE.  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  However, no obvious indication to proceed with any therapy at this time.  See discussion below regarding anemia.  Recommend [facility] at 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  Etiology of iron  deficiency unclear-question related to occult bleeding/as recently on Eliquis  [currently patient is off Eliquis ].  Proceed with Venofer  today.  # History of autoimmune liver disease primary biliary [given elevated alkaline phosphatase -March 2024 status post liver biopsy-fibrosis no cirrhosis; [facility] on [facility]]; [facility]-March 2025-colonoscopyMRI- March 2024- no  cirrhosis/splenomegaly. Discussed with GI re: capsule.   # A.fib on Eliquis  [Dr. Alwyn; [facility]- [facility]]-given the ongoing iron  deficiency-suspect chronic/occult GI bleed.  Discussed with cardiology currently holding Eliquis -if continued stabilization of hemoglobin noted-Eliquis  could be restarted.  # Rheumatoid arthritis- [Dr.Patel]-on hydroxychloroquine- stable.   # OSA-CPAP- COPD [[facility]]- stable-   # DISPOSITION: # venofer - today # cancel Blood tomorrow # second opinion-at [facility]- re: possible MDS  # Follow up in 1st week of dec 2025- 2-3 days PRIOR- labs- cbc/cnp; iorn studies; ferritin;EPO levels;  HOLD tube D-2 possible 1 unit Blood-  Dr.B

## 2024-07-03 NOTE — Progress Notes (Signed)
 [facility] CONSULT NOTE  Patient Care Team: [name], MD as PCP - General (Internal Medicine) [name], MD as Consulting Physician (Oncology)  CHIEF COMPLAINTS/PURPOSE OF CONSULTATION: ANEMIA   HEMATOLOGY HISTORY  # ANEMIA[Hb; MCV-platelets- WBC; Iron  sat; ferritin;  GFR- CT/US ; EGD/colonoscopy-   Latest Reference Range & Units 10/27/23 16:24  WBC 4.0 - 10.5 K/uL 3.8 (L)  RBC 4.22 - 5.81 MIL/uL 4.23  Hemoglobin 13.0 - 17.0 g/dL 87.5 (L)  HCT 60.9 - 47.9 % 38.2 (L)  MCV 80.0 - 100.0 fL 90.3  MCH 26.0 - 34.0 pg 29.3  MCHC 30.0 - 36.0 g/dL 67.4  RDW 88.4 - 84.4 % 13.6  Platelets 150 - 400 K/uL 90 (L)  (L): Data is abnormally low  HISTORY OF PRESENTING ILLNESS: Patient ambulating-independently .  Accompanied by his wife Montell Leopard 68 y.o.  male with  pleasant patient with history of COPD-OSA- CPAP- CAD/A.fib prior history of eliquis ; PBC-no cirrhosis or hepatosplenomegaly is here for follow-up of anemia/thrombocytopenia/leukopenia-status post bone marrow biopsy is here for follow-up  Given lack of significant improvement of anemia post iron  infusion-and also given ongoing leukopenia/neutropenia-thrombocytopenia-patient underwent a bone marrow biopsy for further evaluation.  Patient noted to have some improvement of his energy levels.  However currently feels tired..  Patient denies any bleeding.  Of note patient has been taken off Eliquis  because of concerns of occult GI bleed  Patient states that his liver numbers are improving while on ursodiol.    Review of Systems  Constitutional:  Positive for malaise/fatigue and weight loss. Negative for chills, diaphoresis and fever.  HENT:  Negative for nosebleeds and sore throat.   Eyes:  Negative for double vision.  Respiratory:  Negative for cough, hemoptysis, sputum production, shortness of breath and wheezing.   Cardiovascular:  Negative for chest pain, palpitations, orthopnea and leg  swelling.  Gastrointestinal:  Negative for abdominal pain, blood in stool, constipation, diarrhea, heartburn, melena, nausea and vomiting.  Genitourinary:  Negative for dysuria, frequency and urgency.  Musculoskeletal:  Positive for back pain and joint pain.  Skin: Negative.  Negative for itching and rash.  Neurological:  Negative for dizziness, tingling, focal weakness, weakness and headaches.  Endo/Heme/Allergies:  Does not bruise/bleed easily.  Psychiatric/Behavioral:  Negative for depression. The patient is not nervous/anxious and does not have insomnia.      MEDICAL HISTORY:  Past Medical History:  Diagnosis Date   Actinic keratosis    Allergic state    Arthritis    Asthma    B12 deficiency    Basal cell carcinoma 03/11/2022   left calf, Mohs 04/20/22   BCC (basal cell carcinoma) 04/06/2022   left forearm, tx'd with EDC   Complication of anesthesia    Difficult to wake up with anesthesia from colonoscopy   COPD (chronic obstructive pulmonary disease) (HCC)    Coronary artery disease    Dysrhythmia    Edema    GERD (gastroesophageal reflux disease)    Headache    History of kidney stones    Hypertension    Palpitations    Pulmonary nodules    Sleep apnea    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 SCCis, hypertrophic. Mohs 04/19/2023    SURGICAL HISTORY: Past Surgical History:  Procedure Laterality Date   CHOLECYSTECTOMY     COLONOSCOPY     COLONOSCOPY WITH PROPOFOL  N/A 04/09/2016   Procedure: COLONOSCOPY WITH PROPOFOL ;  Surgeon: Gladis RAYMOND Mariner, MD;  Location:  [facility];  Service: Endoscopy;  Laterality: N/A;   COLONOSCOPY WITH PROPOFOL  N/A 01/05/2021   Procedure: COLONOSCOPY WITH PROPOFOL ;  Surgeon: Toledo, Ladell POUR, MD;  Location: [facility];  Service: Gastroenterology;  Laterality: N/A;   COLONOSCOPY WITH PROPOFOL  N/A 12/20/2022   Procedure: COLONOSCOPY WITH PROPOFOL ;  Surgeon: Maryruth Ole DASEN, MD;  Location: [facility];   Service: Endoscopy;  Laterality: N/A;   COLONOSCOPY WITH PROPOFOL  N/A 01/09/2024   Procedure: COLONOSCOPY WITH PROPOFOL ;  Surgeon: Maryruth Ole DASEN, MD;  Location: [facility];  Service: Endoscopy;  Laterality: N/A;   ESOPHAGOGASTRODUODENOSCOPY (EGD) WITH PROPOFOL  N/A 01/05/2021   Procedure: ESOPHAGOGASTRODUODENOSCOPY (EGD) WITH PROPOFOL ;  Surgeon: Toledo, Ladell POUR, MD;  Location: [facility];  Service: Gastroenterology;  Laterality: N/A;   ESOPHAGOGASTRODUODENOSCOPY (EGD) WITH PROPOFOL  N/A 12/20/2022   Procedure: ESOPHAGOGASTRODUODENOSCOPY (EGD) WITH PROPOFOL ;  Surgeon: Maryruth Ole DASEN, MD;  Location: [facility];  Service: Endoscopy;  Laterality: N/A;   HEMOSTASIS CLIP PLACEMENT  01/09/2024   Procedure: CONTROL OF HEMORRHAGE, GI TRACT, ENDOSCOPIC, BY CLIPPING OR OVERSEWING;  Surgeon: Maryruth Ole DASEN, MD;  Location: [facility];  Service: Endoscopy;;   IR BONE MARROW BIOPSY & ASPIRATION  06/06/2024   TEE WITHOUT CARDIOVERSION N/A 02/21/2019   Procedure: TRANSESOPHAGEAL ECHOCARDIOGRAM (TEE);  Surgeon: Hester Wolm PARAS, MD;  Location: [facility];  Service: Cardiovascular;  Laterality: N/A;    SOCIAL HISTORY: Social History   Socioeconomic History   Marital status: Married    Spouse name: Not on file   Number of children: Not on file   Years of education: Not on file   Highest education level: Not on file  Occupational History   Not on file  Tobacco Use   Smoking status: Former    Current packs/day: 0.00    Average packs/day: 1.5 packs/day for 46.0 years (69.0 ttl pk-yrs)    Types: Cigarettes    Start date: 09/02/1971    Quit date: 09/01/2017    Years since quitting: 6.8   Smokeless tobacco: Never  Vaping Use   Vaping status: Never Used  Substance and Sexual Activity   Alcohol use: No   Drug use: No   Sexual activity: Yes    Birth control/protection: None  Other Topics Concern   Not on file  Social History Narrative   46 years smoking; quit 2016; no alcohol;  lives in [place]. Careers adviser. 1 living daughter-; 5 mins away.    Social Drivers of Corporate investment banker Strain: Low Risk  (11/09/2023)   Received from [facility]   Overall Financial Resource Strain (CARDIA)    Difficulty of Paying Living Expenses: Not hard at all  Food Insecurity: No Food Insecurity (03/26/2024)   Hunger Vital Sign    Worried About Running Out of Food in the Last Year: Never true    Ran Out of Food in the Last Year: Never true  Transportation Needs: No Transportation Needs (03/26/2024)   PRAPARE - Administrator, Civil Service (Medical): No    Lack of Transportation (Non-Medical): No  Physical Activity: Not on file  Stress: Not on file  Social Connections: Not on file  Intimate Partner Violence: Not At Risk (03/26/2024)   Humiliation, Afraid, Rape, and Kick questionnaire    Fear of Current or Ex-Partner: No    Emotionally Abused: No    Physically Abused: No    Sexually Abused: No    FAMILY HISTORY: Family History  Problem Relation Age of Onset   Colon cancer Father  70       died of cancer   Stomach cancer Father    Cancer - Colon Father    Hypertension Mother    Prostate cancer Neg Hx    Bladder Cancer Neg Hx    Kidney cancer Neg Hx     ALLERGIES:  is allergic to amlodipine and codeine.  MEDICATIONS:  Current Outpatient Medications  Medication Sig Dispense Refill   ADVAIR DISKUS 250-50 MCG/DOSE AEPB SMARTSIG:1 Inhalation Via Inhaler Every 12 Hours     albuterol  (PROVENTIL  HFA;VENTOLIN  HFA) 108 (90 Base) MCG/ACT inhaler Inhale 2 puffs into the lungs every 6 (six) hours as needed for wheezing or shortness of breath.     Cholecalciferol  (VITAMIN D3) 50 MCG (2000 UT) capsule Take 2,000 Units by mouth daily.      hydroxychloroquine (PLAQUENIL) 200 MG tablet Take 200 mg by mouth 2 (two) times daily.     INCRUSE ELLIPTA  62.5 MCG/INH AEPB Inhale 1 puff into the lungs daily.     mirtazapine  (REMERON ) 7.5 MG tablet Take 7.5  mg by mouth at bedtime as needed (anxiousness/rest).     pantoprazole  (PROTONIX ) 40 MG tablet Take 40 mg by mouth 2 (two) times a day.     potassium chloride  (KLOR-CON ) 10 MEQ tablet Take 10 mEq by mouth daily.     telmisartan (MICARDIS) 80 MG tablet Take 80 mg by mouth daily.     ursodiol (ACTIGALL) 300 MG capsule Take 600 mg by mouth.     vitamin B-12 (CYANOCOBALAMIN ) 1000 MCG tablet Take 1,000 mcg by mouth daily.     No current facility-administered medications for this visit.     PHYSICAL EXAMINATION:   Vitals:   07/03/24 1426  BP: 103/76  Pulse: 73  Resp: 20  Temp: 98.7 F (37.1 C)  SpO2: 99%    Filed Weights   07/03/24 1426  Weight: 177 lb 3.2 oz (80.4 kg)     Physical Exam Vitals and nursing note reviewed.  HENT:     Head: Normocephalic and atraumatic.     Mouth/Throat:     Pharynx: Oropharynx is clear.  Eyes:     Extraocular Movements: Extraocular movements intact.     Pupils: Pupils are equal, round, and reactive to light.  Cardiovascular:     Rate and Rhythm: Normal rate and regular rhythm.  Pulmonary:     Comments: Decreased breath sounds bilaterally.  Abdominal:     Palpations: Abdomen is soft.  Musculoskeletal:        General: Normal range of motion.     Cervical back: Normal range of motion.  Skin:    General: Skin is warm.  Neurological:     General: No focal deficit present.     Mental Status: He is alert and oriented to person, place, and time.  Psychiatric:        Behavior: Behavior normal.        Judgment: Judgment normal.      LABORATORY DATA:  I have reviewed the data as listed Lab Results  Component Value Date   WBC 2.9 (L) 07/03/2024   HGB 10.8 (L) 07/03/2024   HCT 35.8 (L) 07/03/2024   MCV 80.6 07/03/2024   PLT 101 (L) 07/03/2024   Recent Labs    05/10/24 0931 06/26/24 1304 07/03/24 1440  NA 137 136 135  K 3.9 3.8 3.7  CL 109 107 104  CO2 22 24 23   GLUCOSE 108* 107* 89  BUN 12 12 9   CREATININE 0.98 1.03  0.98   CALCIUM  8.5* 8.5* 8.4*  GFRNONAA >60 >60 >60  PROT 6.1* 6.4* 6.3*  ALBUMIN 3.3* 3.5 3.4*  AST 19 25 19   ALT 15 17 14   ALKPHOS 181* 215* 231*  BILITOT 0.7 1.0 0.8     IR BONE MARROW BIOPSY & ASPIRATION Result Date: 06/06/2024 INDICATION: 68 year old with leukopenia, thrombocytopenia and anemia. EXAM: FLUOROSCOPIC GUIDED BONE MARROW ASPIRATES AND BIOPSY Physician: Juliene SAUNDERS. Henn, MD MEDICATIONS: Moderate sedation ANESTHESIA/SEDATION: Moderate (conscious) sedation was employed during this procedure. A total of Versed  2 mg and fentanyl  100 mcg was administered intravenously at the order of the provider performing the procedure. Total intra-service moderate sedation time: . Patient's level of consciousness and vital signs were monitored continuously by radiology nurse throughout the procedure under the supervision of the provider performing the procedure. FLUOROSCOPY: Radiation Exposure Index (as provided by the fluoroscopic device): 11 mGy Kerma COMPLICATIONS: None immediate. PROCEDURE: The procedure was explained to the patient. The risks and benefits of the procedure were discussed and the patient's questions were addressed. Informed consent was obtained from the patient. The patient was placed prone on interventional table. The back was prepped and draped in sterile fashion. Maximal barrier sterile technique was utilized including caps, mask, sterile gowns, sterile gloves, sterile drape, hand hygiene and skin antiseptic. The skin and right posterior ilium were anesthetized with 1% lidocaine . 11 gauge bone needle was directed into the right ilium with fluoroscopic guidance. Two aspirates and 2 core biopsies were obtained. Bandage placed over the puncture site. Fluoroscopic image saved for documentation. IMPRESSION: Fluoroscopic guided bone marrow aspiration and core biopsy. Electronically Signed   By: Juliene Balder M.D.   On: 06/06/2024 10:19    ASSESSMENT & PLAN:   Symptomatic anemia #  Pancytopenia-with predominant anemia [progressively worse anemia since MAY 2024; since Jan 2025- Mild moderate thrombocytopenia/leukopenia-neutropenia/lymphopenia] BONE MARROW BIOPSY: AUG 2025- - Normocellular bone marrow with equivocal erythroid and megakaryocytic dyspoiesis  There is slight nuclear irregularity to the erythroid precursors; however, definitive dysplasia is not identified.  Likewise, the megakaryocytes show a mild amount of hyper lobulation, but overall they are adequate in number and without definitive morphologic evidence of dysplasia.  The myeloid series is unremarkable.  CD34 focally highlights minimal increased blasts (still under 5%) with focal minimal clusters (2-3) suggestive of but not definitive for abnormally localized immature precursors.  A hypercellularity is not present.  The overall findings could be reactive in nature however some of the findings are suspicious for possible MDS but not morphologically pathognomonic. NGS- NEGATIVE.  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  However, no obvious indication to proceed with any therapy at this time.  See discussion below regarding anemia.  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  Etiology of iron  deficiency unclear-question related to occult bleeding/as recently on Eliquis  [currently patient is off Eliquis ].  Proceed with Venofer  today.  # History of autoimmune liver disease primary biliary [given elevated alkaline phosphatase -March 2024 status post liver biopsy-fibrosis no cirrhosis; [facility] on [facility]]; [facility]-March 2025-colonoscopyMRI- March 2024- no  cirrhosis/splenomegaly. Discussed with GI re: capsule.   # A.fib on Eliquis  [Dr. Alwyn; [facility]- [facility]]-given the ongoing iron  deficiency-suspect chronic/occult GI bleed.  Discussed with cardiology currently  holding Eliquis -if continued stabilization of hemoglobin noted-Eliquis  could be restarted.  # Rheumatoid arthritis- [Dr.Patel]-on hydroxychloroquine- stable.   # OSA-CPAP- COPD [[facility]]- stable-   # DISPOSITION: # venofer - today # cancel Blood tomorrow # second opinion-at [facility]- re: possible MDS  # Follow up in 1st week of dec 2025- 2-3 days PRIOR- labs- cbc/cnp; iorn studies; ferritin;EPO levels;  HOLD tube D-2 possible 1 unit Blood-  Dr.B       Cindy JONELLE Joe, MD 07/03/2024 8:46 PM

## 2024-07-27 ENCOUNTER — Telehealth: Payer: Self-pay | Admitting: *Deleted

## 2024-07-27 ENCOUNTER — Encounter: Payer: Self-pay | Admitting: Internal Medicine

## 2024-07-27 NOTE — Telephone Encounter (Signed)
 A referral with medical records was faxed to [facility] for 2nd opinion re: possible MDS on 07/05/24. Pt sent my chart message today stating he has not heard from anyone at [facility]. I will reach out to [facility].

## 2024-08-15 ENCOUNTER — Telehealth: Payer: Self-pay | Admitting: *Deleted

## 2024-08-15 NOTE — Telephone Encounter (Signed)
 Received Mychart message from patient that he still has not heard from [facility] regarding referral for possible MDS.  RN called [facility] oncology who stated that they had not received either faxed referrals sent on 07/05/24 or 07/27/24.  RN verified fax number which was informed was incorrect.  Nurse was also told that unless the diagnosis was MDS, or a malignancy diagnosis already established the referral for possible MDS would need to be sent to benign hematology for review.  Number given for that clinic. RN called and spoke with Iris who stated she had no record of referral.  Numbers to both clinics were received and updated.  Referral sent to benign hematology clinic where patient records and referral would be reviewed by APP and then patient would be contacted within two days.  RN faxed referral to [phone] as instructed. Fax was successful and RN called [phone] option #5 and Iris stated fax/referral was received.    RN send Mychart message to patient to give update and that [facility] would be contacting him soon. Instructed if he had not heard anything by Friday to call our office.

## 2024-08-16 NOTE — Telephone Encounter (Signed)
 Called back today to check status at [facility], left message to call us  back with status.

## 2024-08-16 NOTE — Progress Notes (Signed)
 Follow-up, COPD, and Emphysema   History of Present Illness: Derrick Sheppard is a 69 y.o. 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    Current Medications:  Current Outpatient Medications  Medication Sig Dispense Refill  . albuterol  (PROVENTIL ) 2.5 mg /3 mL (0.083 %) nebulizer solution INHALE 3 MLS BY NEBULIZATION EVERY 6 (SIX) HOURS AS NEEDED FOR WHEEZING 1080 mL 0  . albuterol  MDI, PROVENTIL , VENTOLIN , PROAIR , HFA 90 mcg/actuation inhaler TAKE 2 PUFFS BY MOUTH EVERY 6 HOURS AS NEEDED FOR WHEEZE 6.7 each 11  . cholecalciferol  (VITAMIN D3) 2,000 unit tablet Take 2,000 Units by mouth once daily    . fluticasone  propion-salmeteroL (WIXELA INHUB) 250-50 mcg/dose diskus inhaler Inhale 1 Puff into the lungs every 12 (twelve) hours 60 each 3  . fluticasone  propionate (FLONASE ) 50 mcg/actuation nasal spray Place into one nostril once daily as needed       . hydroxychloroquine (PLAQUENIL) 200 mg tablet Take 1 tablet (200 mg total) by mouth 2 (two) times daily 180 tablet 1  . pantoprazole  (PROTONIX ) 40 MG DR tablet TAKE 1 TABLET (40 MG TOTAL) BY MOUTH 2 (TWO) TIMES DAILY BEFORE MEALS 180 tablet 3  . potassium chloride  (KLOR-CON ) 10 MEQ ER tablet TAKE 1 TABLET BY MOUTH ONCE DAILY 90 tablet 3  . promethazine-dextromethorphan (PROMETHAZINE-DM) 6.25-15 mg/5 mL syrup Take 5 mLs by mouth every 6 (six) hours as needed for Cough 120 mL 0  . seladelpar 10 mg Cap Take 1 capsule (10 mg) by mouth once daily 90 capsule 3  . telmisartan (MICARDIS) 20 MG tablet TAKE 1 TABLET BY MOUTH EVERY DAY 90 tablet 3  . umeclidinium (INCRUSE ELLIPTA ) 62.5 mcg/actuation DsDv inhalation unit Inhale 1 Puff into the lungs once daily 30 each 11  . ursodioL (ACTIGALL) 300 mg capsule TAKE 2 CAPSULES BY MOUTH 2 TIMES DAILY. 360 capsule 3   No current facility-administered medications for this visit.    Problem List:  Patient  Active Problem List  Diagnosis  . COPD (chronic obstructive pulmonary disease) (CMS/HHS-HCC)  . GERD (gastroesophageal reflux disease)  . Arthritis  . Edema  . B12 deficiency  . Essential hypertension  . Coronary artery disease  . Senile purpura ()  . Aortic atherosclerosis  . Nocturnal hypoxia  . Bilateral carotid artery stenosis  . PFO with atrial septal aneurysm (HHS-HCC)  . History of stroke  . Paroxysmal atrial fibrillation (CMS/HHS-HCC)  . Abnormal serum angiotensin-converting enzyme level  . Acquired thrombophilia (HHS-HCC)  . OSA (obstructive sleep apnea)  . Anemia  . Rheumatoid arthritis involving multiple sites with positive rheumatoid factor (CMS/HHS-HCC)  . Primary biliary cholangitis (CMS/HHS-HCC)    History: Past Medical History:  Diagnosis Date  . Acute blood loss anemia   . Allergic state   . Anxiety   . Arthritis   . Asthma without status asthmaticus (HHS-HCC)   . Atrial fibrillation (CMS/HHS-HCC) not sure  . Atypical migraine    Episode altered mental status 1/11; seen in ER in [place].  Report of CT, MRI, carotid dopplers negative. Thought to represent TIA vs atypical migraine.  . B12 deficiency   . COPD (chronic obstructive pulmonary disease) (CMS/HHS-HCC)   . Coronary artery disease    By CT scan. Myoview 5/16 negative for ischemia  . COVID-19 06/03/2020  . 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 Increased edema 3/09; Echo 4/09 with normal LV function.  Compression hose recommended.  . Emphysema of lung (CMS/HHS-HCC)   . Essential hypertension 10/22/2014  . GERD (gastroesophageal reflux disease)   . History of stroke   . Palpitations   . Pneumothorax, traumatic 09/21/2017   Admitted 11/18 after fall from a ladder, with multiple rib fractures.  Required pigtail catheter placement.  . Primary biliary cholangitis (CMS/HHS-HCC) 08/11/2023   Followed by  [facility] gastroenterology   . Pulmonary nodules    by CT 1/16; stable by CT 4/16, with yearly screening recommended  . Rheumatoid arthritis involving multiple sites with positive rheumatoid factor (CMS/HHS-HCC) 07/04/2023  . Serrated adenoma of colon 04/09/2016  . Sleep apnea not sure  . Tubular adenoma of colon 04/09/2016    Past Surgical History:  Procedure Laterality Date  . COLONOSCOPY  08/13/2010   04/12/2006  . COLONOSCOPY  04/09/2016   Multiple Tubular adenoma of colon/Serrated Adenoma/Repeat 2 to 3 yrs/MUS  . CHOLECYSTECTOMY  02/21/2020   Robot assisted    I. Tye  . COLONOSCOPY  01/05/2021   Tubular adenomas/Repeat 38yrs/TKT  . EGD  01/05/2021   Gastritis/Focal intestinal metaplasia/Repeat 40yrs/TKT  . MOHS SURGERY  2023   left leg  . Colon @ [facility]  12/20/2022   Tubular adenomas/Fair prep. Will need repeat colonoscopy in 6 months/CTL  (05/24/2023 Recall letter returned.awb)  . EGD @ [facility]  12/20/2022   Gastritis/EGD with IM. Given family history of stomach cancer, would repeat EGD in 3 years/CTL  . ENDOSCOPY OF BILIARY DUCT N/A 01/28/2023   Procedure: ENDOSCOPIC CATHETERIZATION OF THE BILIARY DUCTAL SYSTEM, RADIOLOGICAL SUPERVISION AND INTERPRETATION;  Surgeon: Burbridge, Asberry Caldron, MD;  Location: [facility];  Service: Gastroenterology;  Laterality: N/A;  . ENDOSCOPIC RETROGRADE CHOLANGIO-PANCREATOGRAPHY W/SPHINCTEROTOMY  01/28/2023   Procedure: ENDOSCOPIC RETROGRADE CHOLANGIOPANCREATOGRAPHY (ERCP); WITH SPHINCTEROTOMY/PAPILLOTOMY;  Surgeon: Burbridge, Asberry Caldron, MD;  Location: [facility];  Service: Gastroenterology;;  . Colon @ [facility]  01/09/2024   Path showed avm. Will need repeat colonoscopy in 3 years/CTL  . PERCUTANEOUS BIOPSY LUNG      Family History  Problem Relation Name Age of Onset  . Alzheimer's disease Mother Caldron   . High blood pressure (Hypertension) Mother Caldron   . Stomach cancer Father Nadara   . Coronary Artery Disease (Blocked  arteries around heart) Father Nadara        Bypass in his 4s  . High blood pressure (Hypertension) Father Nadara   . Colon cancer Father Nadara 34  . Cancer Father Nadara        stomach and colon  . Rheum arthritis Sister    . Anesthesia problems Neg Hx      Social History   Socioeconomic History  . Marital status: Married  Occupational History  . Occupation: Retired  Tobacco Use  . Smoking status: Former    Current packs/day: 0.00    Average packs/day: 2.0 packs/day for 45.0 years (90.0 ttl pk-yrs)    Types: Cigarettes    Start date: 08/30/1972    Quit date: 08/30/2017    Years since quitting: 6.9  . Smokeless tobacco: Never  Vaping Use  . Vaping status: Never Used  Substance and Sexual Activity  . Alcohol use: Never  . Drug use: No  . Sexual activity: Yes    Partners: Female    Birth control/protection: Condom   Social Drivers of Corporate Investment Banker Strain: Low Risk  (07/12/2024)   Overall Physicist, Medical Strain (  CARDIA)   . Difficulty of Paying Living Expenses: Not hard at all  Food Insecurity: No Food Insecurity (07/12/2024)   Hunger Vital Sign   . Worried About Programme Researcher, Broadcasting/film/video in the Last Year: Never true   . Ran Out of Food in the Last Year: Never true  Transportation Needs: No Transportation Needs (07/12/2024)   PRAPARE - Transportation   . Lack of Transportation (Medical): No   . Lack of Transportation (Non-Medical): No    Allergies:  Codeine and Norvasc [amlodipine]  Review of Systems: As per above. Pretty much unchanged with the exception that his breathing is improved. No associated cardiopulmonary, GI, GU, dermatological symptoms today. No focal neurological symptoms or psychological changes.   Physical Exam: BP (!) 143/77   Pulse 79   Ht 170.2 cm (5' 7)   Wt 81.6 kg (180 lb)   SpO2 95%   BMI 28.19 kg/m  81.6 kg (180 lb) 95% General:  NAD. Able to speak in complete sentences without cough or dyspnea HEENT: Normocephalic, nontraumatic.  Extraocular movements intact NECK: Supple. No JVD, nodes, thryomegaly CV: RRR no murmurs, gallops, rubs PULM: Normal respiratory effort, Clear to auscultation bilaterally without wheezing or crackles EXTREMITIES: No significant edema, cyanosis or Homans'signs SKIN: Fair turgor. No rashes LYMPHATIC: No nodes NEURO: No gross deficits PSYCH: Appropriate affect   Diagnostics: SPIROMETRY: FVC was 2.84 liters, 81% of predicted FEV1 was 1.61, 58% of predicted FEV1 ratio was 56.57 FEF 25-75% liters per second was 21% of predicted   LUNG VOLUMES: TLC was 91% of predicted RV was 133% of predicted   DIFFUSION CAPACITY: DLCO was 36% of predicted DLCO/VA was 51% of predicted   Spirometry c/w moderate obstruction trendin down from the last visit RV is elevated c/w air trapping DLCO is severely decreased.    Interpreting Physician Theotis Lavelle BRAVO, MD   SPIROMETRY: FVC was 2.98 liters, 96% of predicted FEV1 was 1.62, 66% of predicted FEV1 ratio was 53.35 FEF 25-75% liters per second was 20% of predicted   LUNG VOLUMES: TLC was 109% of predicted RV was 170% of predicted   DIFFUSION CAPACITY: DLCO was 40% of predicted DLCO/VA was 47% of predicted   FLOW VOLUME LOOP: Expiratory flow volume loop is delayed   Impression Spirometry is c/w moderate obstructive ( fvc is better) Lung volumes c/w hyperinflation DLCO is severely decreased   Interpreting Physician Theotis Lavelle BRAVO, MD     SPIROMETRY: FVC was 2.49 Liters, 79% of predicted FEV1 was 1.49 Liters, 60% of predicted FEV1 ratio was 59.31%, 75% of predicted FEF 25-75% Liters per second was 0.66, 25% of predicted *Post done in the Past   DIFFUSION CAPACITY: DLCO was 10.75 mL/min/mmHg VA was 4.53 Liters DLCO/VA was 2.37 mL/min/mmHg/L   LUNG VOLUMES: TLC was 4.95 Liters RV was 2.45 Liters     FLOW VOLUME LOOP: C/w with obstruction ( exp low vol. Loop is delkayed)   Impression Spirometry is c/w moderate  obtruction RV is elevated, suspect hyper inflation or air trapping DLCO is severely decreased.      Mild centrilobular emphysematous changes. No focal consolidation.  Stable 5 mm right lower lobe subpleural nodule (7:118). Stable 5 mm  right lung nodule noted (7:86). Stable other 5 mm nodule noted  within the right lower lobe (7:101). Persistent pleural based  nodularity/irregularity along the left lower lobe (7:90). Query  associated pleural base nodules measuring 2.3 x 0.7 cm and 0.5 x 0.5  cm. No new pulmonary nodule. No pulmonary  mass. No pleural effusion.  No pneumothorax.   Musculoskeletal:   No chest wall abnormality.   No suspicious lytic or blastic osseous lesions. No acute displaced  fracture. Multilevel degenerative changes of the spine.   Review of the MIP images confirms the above findings.   CTA ABDOMEN AND PELVIS FINDINGS   VASCULAR   Aorta: Severe atherosclerotic plaque. Normal caliber aorta without  aneurysm, dissection, vasculitis or significant stenosis.   Celiac: Patent without evidence of aneurysm, dissection, vasculitis  or significant stenosis.   SMA: At least moderate to severe atherosclerotic plaque of its  origin. Patent without evidence of aneurysm, dissection, vasculitis  or significant stenosis.   Renals: At least mild atherosclerotic plaque of the origins. Both  renal arteries are patent without evidence of aneurysm, dissection,  vasculitis, fibromuscular dysplasia or significant stenosis.   IMA: Patent without evidence of aneurysm, dissection, vasculitis or  significant stenosis.   Inflow: Moderate to severe atherosclerotic plaque. Patent without  evidence of aneurysm, dissection, vasculitis or significant  stenosis.   Veins: No obvious venous abnormality within the limitations of this  arterial phase study.   Review of the MIP images confirms the above findings.   NON-VASCULAR   Hepatobiliary: No focal liver abnormality. Status  post  cholecystectomy. No biliary dilatation.   Pancreas: No focal lesion. Normal pancreatic contour. No surrounding  inflammatory changes. No main pancreatic ductal dilatation.   Spleen: Normal in size without focal abnormality.   Adrenals/Urinary Tract:   No adrenal nodule bilaterally.   Bilateral kidneys enhance symmetrically.   No hydronephrosis. No hydroureter.   The urinary bladder is unremarkable.   Stomach/Bowel: Stomach is within normal limits. No evidence of bowel  wall thickening or dilatation. Fatty infiltration of the wall of the  ascending colon suggestive of chronic inflammatory changes. Appendix  appears normal.   Lymphatic: No lymphadenopathy.   Reproductive: The prostate is enlarged measuring up to 5.6 cm.   Other: No intraperitoneal free fluid. No intraperitoneal free gas.  No organized fluid collection.   Musculoskeletal:   No abdominal wall hernia or abnormality.   Densely sclerotic lesion within the right sacrum likely a bone  island. No suspicious lytic or blastic osseous lesions. No acute  displaced fracture. Multilevel degenerative changes of the spine.   Review of the MIP images confirms the above findings.   IMPRESSION:  1. No acute thoracic abnormality. Aortic Atherosclerosis  (ICD10-I70.0) - severe including at least 2 vessel coronary  calcification.  2. No pulmonary embolus.  3. Persistent pleural based nodularity/irregularity along the left  lower lobe with question of persistent pleural based nodules  measuring 2.3 x 0.7 cm and 0.5 x 0.5 cm. Stable three right lower  lobe 5 mm subpleural nodules.  4. Prostatomegaly.  5.  Emphysema (ICD10-J43.9).    Antinuclear Antibodies, IFA - [facility] Negative      Mitochondrial Ab - [facility] 0.0 - 20.0 Units 92.2 High     mooth Muscle Ab - [facility] 0 - 19 Units 9      QuantiFERON Incubation - [facility]   Incubation performed.      QuantiFERON Criteria - [facility]   Comment   Comment:  QuantiFERON-TB Gold Plus is a chartered certified accountant for  M tuberculosis infection (including disease) and is intended for use  in conjunction with risk assessment, radiography, and other medical  and diagnostic evaluations. The QuantiFERON-TB Gold Plus result is  determined by subtracting the Nil value from either TB antigen (Ag)  value. The Mitogen  tube serves as a control for the test.   QuantiFERON TB1 Ag Value - [facility] IU/mL 0.04    QuantiFERON TB2 Ag Value - [facility] IU/mL 0.08    QuantiFERON Nil Value - [facility] IU/mL 0.06    QuantiFERON Mitogen Value - [facility] IU/mL >10.00    QuantiFERON TB Gold - [facility] Negative Negative     Ace level 93   Component Ref Range & Units 7 mo ago     A.Fumigatus #1 Abs - [facility] Negative Negative    Micropolyspora faeni, IgG - [facility] Negative Negative    Thermoactinomyces vulgaris, IgG - [facility] Negative Negative    A. Pullulans Abs - [facility] Negative Negative    Thermoact. Saccharii - [facility] Negative Negative    Pigeon Serum Abs - [facility] Negative Negative   Resulting Agency   [facility] <redacted file path>    Anti-MPO Antibodies - [facility] 0.0 - 0.9 units <0.2          Anti-PR3 Antibodies - [facility] 0.0 - 0.9 units <0.2        Gytoplasmic (C-ANCA) - [facility] Neg:<1:20 titer <1:20  <1:20 <redacted file path>  <1:20 <redacted file path>    Perinuclear (P-ANCA) - [facility] Neg:<1:20 titer <1:20  <1:20 <redacted file path> CM  <1:20 <redacted file path> CM   Comment: The presence of positive fluorescence exhibiting P-ANCA or C-ANCA  patterns alone is not specific for the diagnosis of Wegener's  Granulomatosis (WG) or microscopic polyangiitis. Decisions about  treatment should not be based solely on ANCA IFA results.  The  International ANCA Group Consensus recommends follow up testing of  positive sera with both PR-3 and MPO-ANCA enzyme immunoassays. As  many as 5% serum samples are positive only by EIA.  Ref. AM J Clin Pathol 1999;111:507-513.    Atypical pANCA - [facility] Neg:<1:20 titer <1:20            FINDINGS:  Cardiovascular: Extensive multi-vessel coronary artery  calcification. Global cardiac size within normal limits. No  pericardial effusion. Central pulmonary arteries are of normal  caliber. Moderate atherosclerotic calcification within the thoracic  aorta. No aortic aneurysm.   Mediastinum/Nodes: 13 mm right thyroid  nodule, unlikely of clinical  significance in a patient of this age. No follow-up imaging is  recommended. No pathologic thoracic adenopathy. Esophagus  unremarkable.   Lungs/Pleura: Stable moderate to severe centrilobular emphysema.  Surgical changes of right upper and middle lobar pulmonary wedge  resection again noted. Stable scarring within the left apex.  Ground-glass opacity within the basilar right upper lobe may relate  to atelectasis given its plate like configuration.   Pleural based plaque-like pulmonary mass within the posterior basal  left lower lobe, hypermetabolic on prior PET CT examination of  04/17/2020 and biopsied on 06/26/2020, appears stable measuring 6 x  20 mm at axial image # 93/3.   Somewhat spiculated mean 6 mm pulmonary nodule within the right  upper lobe, axial image # 66/3, appears stable since remote prior  examination of 11/21/2020 and is safely considered benign.   5 mm pulmonary nodule, right lower lobe, axial image # 101/3,  stable. 4 mm pulmonary nodule, right lower lobe, axial image #  111/3, stable. Given their stability over time, these lesions are  safely considered benign.   No pneumothorax or pleural effusion.  No central obstructing lesion.   Upper Abdomen: Cirrhosis. Progressive splenomegaly, incompletely  evaluated on this exam. No acute abnormality.   Musculoskeletal: No acute bone abnormality. No lytic or blastic bone  lesion.   IMPRESSION:  1. Multiple pulmonary nodules, stable since remote prior examination  of 11/21/2020. This includes  plaque-like pulmonary nodule within the  left lower lobe previously demonstrated as hypermetabolic on PET CT  examination of 04/17/2020. Given its stability this may have  represented an inflammatory process on prior examination, however,  correlation with prior biopsy result is recommended. Given  stability, recommend return to routine low-dose annual lung cancer  screening CT examination.  2. Stable moderate to severe centrilobular emphysema.  3. Extensive multi-vessel coronary artery calcification.  4. Cirrhosis. Progressive splenomegaly, incompletely evaluated on  this exam.   Aortic Atherosclerosis (ICD10-I70.0) and Emphysema (ICD10-J43.9).    Electronically Signed    By: Derrick Sheppard M.D.    On: 12/25/2022 04:33    Liver, right lobe, needle core biopsy: Mild to moderate portal and lobular inflammation with associated mild bile duct injury. Focal periportal fibrosis on trichrome stain. Please see comment and microscopic examination.     lpha-1 Antitrypsin 90 - 200 mg/dL 830    ECHO NORMAL LEFT VENTRICULAR SYSTOLIC FUNCTION WITH NO LVH ESTIMATED EF: >55% NORMAL LA PRESSURES WITH NORMAL DIASTOLIC FUNCTION NORMAL RIGHT VENTRICULAR SYSTOLIC FUNCTION VALVULAR REGURGITATION: TRIVIAL AR, MILD MR, TRIVIAL PR, MILD TR ESTIMATED RVSP: 24 mmHg NO VALVULAR STENOSIS  Impression: Copd. stage II,  dyspnea no worse on present regimen.   Continue  albuterol , wixella  250/50 one puff bid, incruse one puff q day  Maintain ideal weight F/u in 6 months   Sleep apnea, on cpap >  6 hrs nightly. Rested most mornings. No hypersomnolence,   AHI  0.1 on auto cpap. 100 % compliance Maintain ideal weight Stay on auto sleep at 5-20 cm h20   Lung-RADS 4B, suspicious. Subpleural left lower lobe   chest ct 12/22 SHOWED NO CHANGE On biopsy, some multi nucleated giant cells and polarizable foreign matter. C/W granulomatous dz. ? Heavy metal exposure. he did work in designer, fashion/clothing. No malignancy Repeat  chest ct 11/25   Anemia, leukopenia, thrombocytopenia ? MDS Oncology / heme following   Rheumatoid arthritis, on plaquenil Rheum following

## 2024-08-17 ENCOUNTER — Other Ambulatory Visit: Payer: Self-pay | Admitting: Specialist

## 2024-08-17 DIAGNOSIS — Z87891 Personal history of nicotine dependence: Secondary | ICD-10-CM

## 2024-08-17 DIAGNOSIS — J449 Chronic obstructive pulmonary disease, unspecified: Secondary | ICD-10-CM

## 2024-08-29 ENCOUNTER — Ambulatory Visit: Payer: Medicare HMO | Admitting: Dermatology

## 2024-08-29 ENCOUNTER — Encounter: Payer: Self-pay | Admitting: Dermatology

## 2024-08-29 ENCOUNTER — Ambulatory Visit: Admitting: Dermatology

## 2024-08-29 DIAGNOSIS — D1801 Hemangioma of skin and subcutaneous tissue: Secondary | ICD-10-CM

## 2024-08-29 DIAGNOSIS — L729 Follicular cyst of the skin and subcutaneous tissue, unspecified: Secondary | ICD-10-CM

## 2024-08-29 DIAGNOSIS — Z8589 Personal history of malignant neoplasm of other organs and systems: Secondary | ICD-10-CM

## 2024-08-29 DIAGNOSIS — Z85828 Personal history of other malignant neoplasm of skin: Secondary | ICD-10-CM

## 2024-08-29 DIAGNOSIS — W908XXA Exposure to other nonionizing radiation, initial encounter: Secondary | ICD-10-CM

## 2024-08-29 DIAGNOSIS — L821 Other seborrheic keratosis: Secondary | ICD-10-CM | POA: Diagnosis not present

## 2024-08-29 DIAGNOSIS — D229 Melanocytic nevi, unspecified: Secondary | ICD-10-CM

## 2024-08-29 DIAGNOSIS — L578 Other skin changes due to chronic exposure to nonionizing radiation: Secondary | ICD-10-CM

## 2024-08-29 DIAGNOSIS — L720 Epidermal cyst: Secondary | ICD-10-CM

## 2024-08-29 DIAGNOSIS — L814 Other melanin hyperpigmentation: Secondary | ICD-10-CM

## 2024-08-29 DIAGNOSIS — D692 Other nonthrombocytopenic purpura: Secondary | ICD-10-CM

## 2024-08-29 DIAGNOSIS — Z7189 Other specified counseling: Secondary | ICD-10-CM

## 2024-08-29 DIAGNOSIS — Z1283 Encounter for screening for malignant neoplasm of skin: Secondary | ICD-10-CM | POA: Diagnosis not present

## 2024-08-29 DIAGNOSIS — L570 Actinic keratosis: Secondary | ICD-10-CM | POA: Diagnosis not present

## 2024-08-29 DIAGNOSIS — L719 Rosacea, unspecified: Secondary | ICD-10-CM

## 2024-08-29 DIAGNOSIS — L711 Rhinophyma: Secondary | ICD-10-CM

## 2024-08-29 NOTE — Progress Notes (Signed)
 Follow-Up Visit   Subjective  Derrick Sheppard is a 68 y.o. male who presents for the following: Skin Cancer Screening and Full Body Skin Exam. HxBCCs, HxSCCis, HxAKs.   The patient presents for Total-Body Skin Exam (TBSE) for skin cancer screening and mole check. The patient has spots, moles and lesions to be evaluated, some may be new or changing and the patient may have concern these could be cancer.  This patient is accompanied in the office by his wife.   The following portions of the chart were reviewed this encounter and updated as appropriate: medications, allergies, medical history  Review of Systems:  No other skin or systemic complaints except as noted in HPI or Assessment and Plan.  Objective  Well appearing patient in no apparent distress; mood and affect are within normal limits.  A full examination was performed including scalp, head, eyes, ears, nose, lips, neck, chest, axillae, abdomen, back, buttocks, bilateral upper extremities, bilateral lower extremities, hands, feet, fingers, toes, fingernails, and toenails. All findings within normal limits unless otherwise noted below.   Relevant physical exam findings are noted in the Assessment and Plan.  R medial cheek x1 Erythematous thin papules/macules with gritty scale.   Assessment & Plan   SKIN CANCER SCREENING PERFORMED TODAY.  HISTORY OF BASAL CELL CARCINOMA OF THE SKIN. Left calf, Mohs, 04/20/2022. Left forearm, EDC, 04/06/2022. - No evidence of recurrence today - Recommend regular full body skin exams - Recommend daily broad spectrum sunscreen SPF 30+ to sun-exposed areas, reapply every 2 hours as needed.  - Call if any new or changing lesions are noted between office visits  - L calf, L forearm   HISTORY OF SQUAMOUS CELL CARCINOMA IN SITU OF THE SKIN 03/03/23 right temple Mohs 04/19/2023 - No evidence of recurrence today - Recommend regular full body skin exams - Recommend daily broad spectrum sunscreen  SPF 30+ to sun-exposed areas, reapply every 2 hours as needed.  - Call if any new or changing lesions are noted between office visits  ACTINIC DAMAGE - Chronic condition, secondary to cumulative UV/sun exposure - diffuse scaly erythematous macules with underlying dyspigmentation - Recommend daily broad spectrum sunscreen SPF 30+ to sun-exposed areas, reapply every 2 hours as needed.  - Staying in the shade or wearing long sleeves, sun glasses (UVA+UVB protection) and wide brim hats (4-inch brim around the entire circumference of the hat) are also recommended for sun protection.  - Call for new or changing lesions.  LENTIGINES, SEBORRHEIC KERATOSES, HEMANGIOMAS - Benign normal skin lesions - Benign-appearing - Call for any changes  MELANOCYTIC NEVI - Tan-brown and/or pink-flesh-colored symmetric macules and papules - Benign appearing on exam today - Observation - Call clinic for new or changing moles - Recommend daily use of broad spectrum spf 30+ sunscreen to sun-exposed areas.    ROSACEA with rhinophyma  Exam Mid face erythema with telangiectasias +/- scattered inflammatory papules at nose    Chronic and persistent condition with duration or expected duration over one year. Condition is bothersome/symptomatic for patient. Currently flared.    Rosacea is a chronic progressive skin condition usually affecting the face of adults, causing redness and/or acne bumps. It is treatable but not curable. It sometimes affects the eyes (ocular rosacea) as well. It may respond to topical and/or systemic medication and can flare with stress, sun exposure, alcohol, exercise, topical steroids (including hydrocortisone/cortisone 10) and some foods.  Daily application of broad spectrum spf 30+ sunscreen to face is recommended to reduce flares.  Patient denies grittiness of the eyes    Treatment Plan Patient defers treatment   EPIDERMAL INCLUSION CYST Exam: Subcutaneous nodule at left zygoma, L  nasal bridge  Benign-appearing. Exam most consistent with an epidermal inclusion cyst. Discussed that a cyst is a benign growth that can grow over time and sometimes get irritated or inflamed. Recommend observation if it is not bothersome. Discussed option of surgical excision to remove it if it is growing, symptomatic, or other changes noted. Please call for new or changing lesions so they can be evaluated.  Purpura - Chronic; persistent and recurrent.  Treatable, but not curable. - Violaceous macules and patches - Benign - Related to trauma, age, sun damage and/or use of blood thinners, chronic use of topical and/or oral steroids - Observe - Can use OTC arnica containing moisturizer such as Dermend Bruise Formula if desired - Call for worsening or other concerns   AK (ACTINIC KERATOSIS) R medial cheek x1 Actinic keratoses are precancerous spots that appear secondary to cumulative UV radiation exposure/sun exposure over time. They are chronic with expected duration over 1 year. A portion of actinic keratoses will progress to squamous cell carcinoma of the skin. It is not possible to reliably predict which spots will progress to skin cancer and so treatment is recommended to prevent development of skin cancer.  Recommend daily broad spectrum sunscreen SPF 30+ to sun-exposed areas, reapply every 2 hours as needed.  Recommend staying in the shade or wearing long sleeves, sun glasses (UVA+UVB protection) and wide brim hats (4-inch brim around the entire circumference of the hat). Call for new or changing lesions.  RTC in 2 months if not resolved.  Destruction of lesion - R medial cheek x1 Complexity: simple   Destruction method: cryotherapy   Informed consent: discussed and consent obtained   Timeout:  patient name, date of birth, surgical site, and procedure verified Lesion destroyed using liquid nitrogen: Yes   Region frozen until ice ball extended beyond lesion: Yes   Outcome: patient  tolerated procedure well with no complications   Post-procedure details: wound care instructions given   Additional details:  Prior to procedure, discussed risks of blister formation, small wound, skin dyspigmentation, or rare scar following cryotherapy. Recommend Vaseline ointment to treated areas while healing.   Return in about 1 year (around 08/29/2025) for TBSE, HxBCC, HxSCCis, HxAKs.  I, Hser Belanger, CMA, am acting as scribe for Alm Rhyme, MD.   Documentation: I have reviewed the above documentation for accuracy and completeness, and I agree with the above.  Alm Rhyme, MD

## 2024-08-29 NOTE — Patient Instructions (Addendum)
 Cryotherapy Aftercare  Wash gently with soap and water  everyday.   Apply Vaseline Jelly daily until healed.   Call for appointment in 2 months if area treated on right cheek has not resolved.     Recommend daily broad spectrum sunscreen SPF 30+ to sun-exposed areas, reapply every 2 hours as needed. Call for new or changing lesions.  Staying in the shade or wearing long sleeves, sun glasses (UVA+UVB protection) and wide brim hats (4-inch brim around the entire circumference of the hat) are also recommended for sun protection.      Melanoma ABCDEs  Melanoma is the most dangerous type of skin cancer, and is the leading cause of death from skin disease.  You are more likely to develop melanoma if you: Have light-colored skin, light-colored eyes, or red or blond hair Spend a lot of time in the sun Tan regularly, either outdoors or in a tanning bed Have had blistering sunburns, especially during childhood Have a close family member who has had a melanoma Have atypical moles or large birthmarks  Early detection of melanoma is key since treatment is typically straightforward and cure rates are extremely high if we catch it early.   The first sign of melanoma is often a change in a mole or a new dark spot.  The ABCDE system is a way of remembering the signs of melanoma.  A for asymmetry:  The two halves do not match. B for border:  The edges of the growth are irregular. C for color:  A mixture of colors are present instead of an even brown color. D for diameter:  Melanomas are usually (but not always) greater than 6mm - the size of a pencil eraser. E for evolution:  The spot keeps changing in size, shape, and color.  Please check your skin once per month between visits. You can use a small mirror in front and a large mirror behind you to keep an eye on the back side or your body.   If you see any new or changing lesions before your next follow-up, please call to schedule a  visit.  Please continue daily skin protection including broad spectrum sunscreen SPF 30+ to sun-exposed areas, reapplying every 2 hours as needed when you're outdoors.   Staying in the shade or wearing long sleeves, sun glasses (UVA+UVB protection) and wide brim hats (4-inch brim around the entire circumference of the hat) are also recommended for sun protection.      Due to recent changes in healthcare laws, you may see results of your pathology and/or laboratory studies on MyChart before the doctors have had a chance to review them. We understand that in some cases there may be results that are confusing or concerning to you. Please understand that not all results are received at the same time and often the doctors may need to interpret multiple results in order to provide you with the best plan of care or course of treatment. Therefore, we ask that you please give us  2 business days to thoroughly review all your results before contacting the office for clarification. Should we see a critical lab result, you will be contacted sooner.   If You Need Anything After Your Visit  If you have any questions or concerns for your doctor, please call our main line at [phone] and press option 4 to reach your doctor's medical assistant. If no one answers, please leave a voicemail as directed and we will return your call as soon as possible. Messages  left after 4 pm will be answered the following business day.   You may also send us  a message via MyChart. We typically respond to MyChart messages within 1-2 business days.  For prescription refills, please ask your pharmacy to contact our office. Our fax number is [phone].  If you have an urgent issue when the clinic is closed that cannot wait until the next business day, you can page your doctor at the number below.    Please note that while we do our best to be available for urgent issues outside of office hours, we are not available 24/7.   If  you have an urgent issue and are unable to reach us , you may choose to seek medical care at your doctor's office, retail clinic, urgent care center, or emergency room.  If you have a medical emergency, please immediately call 911 or go to the emergency department.  Pager Numbers  - Dr. Hester: [phone]  - Dr. Jackquline: [phone]  - Dr. Claudene: [phone]   - Dr. Raymund: [phone]  In the event of inclement weather, please call our main line at [phone] for an update on the status of any delays or closures.  Dermatology Medication Tips: Please keep the boxes that topical medications come in in order to help keep track of the instructions about where and how to use these. Pharmacies typically print the medication instructions only on the boxes and not directly on the medication tubes.   If your medication is too expensive, please contact our office at [phone] option 4 or send us  a message through MyChart.   We are unable to tell what your co-pay for medications will be in advance as this is different depending on your insurance coverage. However, we may be able to find a substitute medication at lower cost or fill out paperwork to get insurance to cover a needed medication.   If a prior authorization is required to get your medication covered by your insurance company, please allow us  1-2 business days to complete this process.  Drug prices often vary depending on where the prescription is filled and some pharmacies may offer cheaper prices.  The website www.goodrx.com contains coupons for medications through different pharmacies. The prices here do not account for what the cost may be with help from insurance (it may be cheaper with your insurance), but the website can give you the price if you did not use any insurance.  - You can print the associated coupon and take it with your prescription to the pharmacy.  - You may also stop by our office during regular business  hours and pick up a GoodRx coupon card.  - If you need your prescription sent electronically to a different pharmacy, notify our office through [facility] or by phone at [phone] option 4.     Si Usted Necesita Algo Despus de Su Visita  Tambin puede enviarnos un mensaje a travs de Clinical Cytogeneticist. Por lo general respondemos a los mensajes de MyChart en el transcurso de 1 a 2 das hbiles.  Para renovar recetas, por favor pida a su farmacia que se ponga en contacto con nuestra oficina. Randi lakes de fax es [place] [phone].  Si tiene un asunto urgente cuando la clnica est cerrada y que no puede esperar hasta el siguiente da hbil, puede llamar/localizar a su doctor(a) al nmero que aparece a continuacin.   Por favor, tenga en cuenta que aunque hacemos todo lo posible para estar disponibles para asuntos urgentes fuera  del horario de oficina, no estamos disponibles las 24 horas del da, los 7 [address]  [address] de la [place].   Si tiene un problema urgente y no puede comunicarse con nosotros, puede optar por buscar atencin mdica  en el consultorio de su doctor(a), en una clnica privada, en un centro de atencin urgente o en una sala de emergencias.  Si tiene engineer, drilling, por favor llame inmediatamente al 911 o vaya a la sala de emergencias.  Nmeros de bper  - Dr. Hester: [phone]  - Dra. Jackquline: [phone]  - Dr. Claudene: [phone]  - Dra. Kitts: [phone]  En caso de inclemencias del [place], por favor llame a nuestra lnea principal al [phone] para una actualizacin sobre el estado de cualquier retraso o cierre.  Consejos para la medicacin en dermatologa: Por favor, guarde las cajas en las que vienen los medicamentos de uso tpico para ayudarle a seguir las instrucciones sobre dnde y cmo usarlos. Las farmacias generalmente imprimen las instrucciones del medicamento slo en las cajas y no directamente en los tubos del [place].   Si su  medicamento es muy caro, por favor, pngase en contacto con landry rieger llamando al [phone] y presione la opcin 4 o envenos un mensaje a travs de Clinical Cytogeneticist.   No podemos decirle cul ser su copago por los medicamentos por adelantado ya que esto es diferente dependiendo de la cobertura de su seguro. Sin embargo, es posible que podamos encontrar un medicamento sustituto a audiological scientist un formulario para que el seguro cubra el medicamento que se considera necesario.   Si se requiere una autorizacin previa para que su compaa de seguros cubra su medicamento, por favor permtanos de 1 a 2 das hbiles para completar este proceso.  Los precios de los medicamentos varan con frecuencia dependiendo del environmental consultant de dnde se surte la receta y alguna farmacias pueden ofrecer precios ms baratos.  El sitio web www.goodrx.com tiene cupones para medicamentos de health and safety inspector. Los precios aqu no tienen en cuenta lo que podra costar con la ayuda del seguro (puede ser ms barato con su seguro), pero el sitio web puede darle el precio si no utiliz tourist information centre manager.  - Puede imprimir el cupn correspondiente y llevarlo con su receta a la farmacia.  - Tambin puede pasar por nuestra oficina durante el horario de atencin regular y education officer, museum una tarjeta de cupones de GoodRx.  - Si necesita que su receta se enve electrnicamente a una farmacia diferente, informe a nuestra oficina a travs de MyChart de  o por telfono llamando al [phone] y presione la opcin 4.

## 2024-08-30 ENCOUNTER — Ambulatory Visit
Admission: RE | Admit: 2024-08-30 | Discharge: 2024-08-30 | Disposition: A | Discharge status: Home / Self Care | Source: Ambulatory Visit | Attending: Specialist | Admitting: Specialist

## 2024-08-30 DIAGNOSIS — J449 Chronic obstructive pulmonary disease, unspecified: Secondary | ICD-10-CM | POA: Diagnosis present

## 2024-08-30 DIAGNOSIS — Z87891 Personal history of nicotine dependence: Secondary | ICD-10-CM | POA: Insufficient documentation

## 2024-09-02 ENCOUNTER — Encounter: Payer: Self-pay | Admitting: Dermatology

## 2024-09-10 ENCOUNTER — Other Ambulatory Visit

## 2024-09-10 ENCOUNTER — Ambulatory Visit

## 2024-09-10 ENCOUNTER — Ambulatory Visit: Admitting: Internal Medicine

## 2024-09-13 ENCOUNTER — Inpatient Hospital Stay: Attending: Internal Medicine

## 2024-09-13 ENCOUNTER — Ambulatory Visit

## 2024-09-13 ENCOUNTER — Inpatient Hospital Stay: Admitting: Internal Medicine

## 2024-09-13 ENCOUNTER — Other Ambulatory Visit: Payer: Self-pay

## 2024-09-13 ENCOUNTER — Encounter: Payer: Self-pay | Admitting: Internal Medicine

## 2024-09-13 VITALS — BP 131/68 | HR 66 | Temp 97.3°F | Resp 20 | Ht 67.0 in | Wt 176.1 lb

## 2024-09-13 DIAGNOSIS — Z800 Family history of malignant neoplasm of digestive organs: Secondary | ICD-10-CM | POA: Diagnosis not present

## 2024-09-13 DIAGNOSIS — Z7901 Long term (current) use of anticoagulants: Secondary | ICD-10-CM | POA: Diagnosis not present

## 2024-09-13 DIAGNOSIS — D649 Anemia, unspecified: Secondary | ICD-10-CM | POA: Insufficient documentation

## 2024-09-13 DIAGNOSIS — G4733 Obstructive sleep apnea (adult) (pediatric): Secondary | ICD-10-CM | POA: Diagnosis not present

## 2024-09-13 DIAGNOSIS — Z87891 Personal history of nicotine dependence: Secondary | ICD-10-CM | POA: Diagnosis not present

## 2024-09-13 DIAGNOSIS — I700 Atherosclerosis of aorta: Secondary | ICD-10-CM | POA: Diagnosis not present

## 2024-09-13 DIAGNOSIS — R5383 Other fatigue: Secondary | ICD-10-CM | POA: Insufficient documentation

## 2024-09-13 DIAGNOSIS — M069 Rheumatoid arthritis, unspecified: Secondary | ICD-10-CM | POA: Insufficient documentation

## 2024-09-13 DIAGNOSIS — Z86008 Personal history of in-situ neoplasm of other site: Secondary | ICD-10-CM | POA: Diagnosis not present

## 2024-09-13 DIAGNOSIS — R0600 Dyspnea, unspecified: Secondary | ICD-10-CM | POA: Diagnosis not present

## 2024-09-13 DIAGNOSIS — J4489 Other specified chronic obstructive pulmonary disease: Secondary | ICD-10-CM | POA: Diagnosis not present

## 2024-09-13 DIAGNOSIS — I4891 Unspecified atrial fibrillation: Secondary | ICD-10-CM | POA: Insufficient documentation

## 2024-09-13 DIAGNOSIS — Z85828 Personal history of other malignant neoplasm of skin: Secondary | ICD-10-CM | POA: Insufficient documentation

## 2024-09-13 LAB — CBC WITH DIFFERENTIAL (CANCER CENTER ONLY)
Abs Immature Granulocytes: 0.01 K/uL (ref 0.00–0.07)
Basophils Absolute: 0 K/uL (ref 0.0–0.1)
Basophils Relative: 1 %
Eosinophils Absolute: 0 K/uL (ref 0.0–0.5)
Eosinophils Relative: 1 %
HCT: 31.2 % — ABNORMAL LOW (ref 39.0–52.0)
Hemoglobin: 10.1 g/dL — ABNORMAL LOW (ref 13.0–17.0)
Immature Granulocytes: 1 %
Lymphocytes Relative: 24 %
Lymphs Abs: 0.5 K/uL — ABNORMAL LOW (ref 0.7–4.0)
MCH: 26.9 pg (ref 26.0–34.0)
MCHC: 32.4 g/dL (ref 30.0–36.0)
MCV: 83.2 fL (ref 80.0–100.0)
Monocytes Absolute: 0.2 K/uL (ref 0.1–1.0)
Monocytes Relative: 11 %
Neutro Abs: 1.3 K/uL — ABNORMAL LOW (ref 1.7–7.7)
Neutrophils Relative %: 62 %
Platelet Count: 99 K/uL — ABNORMAL LOW (ref 150–400)
RBC: 3.75 MIL/uL — ABNORMAL LOW (ref 4.22–5.81)
RDW: 14.4 % (ref 11.5–15.5)
WBC Count: 2.2 K/uL — ABNORMAL LOW (ref 4.0–10.5)
nRBC: 0 % (ref 0.0–0.2)

## 2024-09-13 LAB — CMP (CANCER CENTER ONLY)
ALT: 11 U/L (ref 0–44)
AST: 19 U/L (ref 15–41)
Albumin: 3.9 g/dL (ref 3.5–5.0)
Alkaline Phosphatase: 165 U/L — ABNORMAL HIGH (ref 38–126)
Anion gap: 10 (ref 5–15)
BUN: 13 mg/dL (ref 8–23)
CO2: 23 mmol/L (ref 22–32)
Calcium: 8.9 mg/dL (ref 8.9–10.3)
Chloride: 108 mmol/L (ref 98–111)
Creatinine: 1.04 mg/dL (ref 0.61–1.24)
GFR, Estimated: >60 mL/min (ref >60)
Glucose, Bld: 101 mg/dL — ABNORMAL HIGH (ref 70–99)
Potassium: 3.9 mmol/L (ref 3.5–5.1)
Sodium: 141 mmol/L (ref 135–145)
Total Bilirubin: 0.7 mg/dL (ref 0.0–1.2)
Total Protein: 6.5 g/dL (ref 6.5–8.1)

## 2024-09-13 LAB — IRON AND TIBC
Iron: 25 ug/dL — ABNORMAL LOW (ref 45–182)
Saturation Ratios: 7 % — ABNORMAL LOW (ref 17.9–39.5)
TIBC: 385 ug/dL (ref 250–450)
UIBC: 360 ug/dL

## 2024-09-13 LAB — SAMPLE TO BLOOD BANK

## 2024-09-13 LAB — FERRITIN: Ferritin: 20 ng/mL — ABNORMAL LOW (ref 24–336)

## 2024-09-13 LAB — PRO BRAIN NATRIURETIC PEPTIDE: Pro Brain Natriuretic Peptide: 70.9 pg/mL (ref <300.0)

## 2024-09-13 NOTE — Progress Notes (Signed)
 Fatigue/weakness:  fatigue Dyspena: sob worse x2-3 weeks Light headedness: no Blood in stool: stools are dark/black seeing  blood when he flushes-started when placed back on eliquis   [facility] placed [facility] back on eliquis  5mg  bid and livdelzi 10mg  every day. He states you took him off of it bur [facility] wants him on it, he needs to know what to do.  Bone scan done at [facility], states osteoporosis but no treatment plan yet.

## 2024-09-13 NOTE — Assessment & Plan Note (Addendum)
#   Pancytopenia-with predominant anemia [progressively worse anemia since MAY 2024; since Jan 2025- Mild moderate thrombocytopenia/leukopenia-neutropenia/lymphopenia] BONE MARROW BIOPSY: AUG 2025- - Normocellular bone marrow with equivocal erythroid and megakaryocytic dyspoiesis  There is slight nuclear irregularity to the erythroid precursors; however, definitive dysplasia is not identified.  Likewise, the megakaryocytes show a mild amount of hyper lobulation, but overall they are adequate in number and without definitive morphologic evidence of dysplasia.  The myeloid series is unremarkable.  CD34 focally highlights minimal increased blasts (still under 5%) with focal minimal clusters (2-3) suggestive of but not definitive for abnormally localized immature precursors.  A hypercellularity is not present.  The overall findings could be reactive in nature however some of the findings are suspicious for possible MDS but not morphologically pathognomonic. NGS- NEGATIVE.  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 Currently awaiting further evaluation at [facility] for a second opinion on dec 10th- Dr.Decastro.   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  Etiology of iron  deficiency unclear-question related to occult bleeding/as recently on Eliquis  [currently patient is off Eliquis ].  Proceed with Venofer .   # History of autoimmune liver disease primary biliary [given elevated alkaline phosphatase -March 2024 status post liver biopsy-fibrosis no cirrhosis; [facility] on [place]]; [facility]-March 2025-colonoscopyMRI- March 2024- no cirrhosis/splenomegaly. Discussed with GI re: capsule.   # A.fib  [Dr. Wilburn; [facility]-  Cards]-given the ongoing iron  deficiency-suspect chronic/occult GI bleed.  Discussed with cardiology currently on Eliquis - monitor closely given the component of iron  deficiency however- NO active bleeding noted.  # Rheumatoid arthritis- [Dr.Patel]-on hydroxychloroquine- stable.   # OSA-CPAP- COPD [[facility]-P[facility]]- stable-   # DISPOSITION: # add BNP to today labs # cancel Blood tomorrow # Follow up in 1 month- 2-3 days PRIOR- labs- cbc/cmp; possible venofer - HOLD tube D-2 possible 1 unit Blood-  Dr.B  Addendum: Venofer  weekly x 2

## 2024-09-13 NOTE — Progress Notes (Signed)
 Derrick Sheppard CONSULT NOTE  Patient Care Team: [name], MD as PCP - General (Internal Medicine) [name], MD as Consulting Physician (Oncology)  CHIEF COMPLAINTS/PURPOSE OF CONSULTATION: ANEMIA   HEMATOLOGY HISTORY  # ANEMIA[Hb; MCV-platelets- WBC; Iron  sat; ferritin;  GFR- CT/US ; EGD/colonoscopy-   Latest Reference Range & Units 10/27/23 16:24  WBC 4.0 - 10.5 K/uL 3.8 (L)  RBC 4.22 - 5.81 MIL/uL 4.23  Hemoglobin 13.0 - 17.0 g/dL 87.5 (L)  HCT 60.9 - 47.9 % 38.2 (L)  MCV 80.0 - 100.0 fL 90.3  MCH 26.0 - 34.0 pg 29.3  MCHC 30.0 - 36.0 g/dL 67.4  RDW 88.4 - 84.4 % 13.6  Platelets 150 - 400 K/uL 90 (L)  (L): Data is abnormally low  HISTORY OF PRESENTING ILLNESS: Patient ambulating-independently .  Accompanied by his wife.  Derrick Sheppard 68 y.o.  male with  pleasant patient with history of COPD-OSA- CPAP- CAD/A.fib prior history of eliquis ; PBC-no cirrhosis or hepatosplenomegaly is here for follow-up of anemia/thrombocytopenia/leukopenia-status post bone marrow biopsy is here for follow-up.  Discussed the use of AI scribe software for clinical note transcription with the patient, who gave verbal consent to proceed.  History of Present Illness   Derrick Sheppard is a 68 year old male with anemia, intermittent neutropenia, and intermittent thrombocytopenia who presents for follow-up.  He has experienced anemia with hemoglobin levels improving from a low of 6 in July to around 10 currently. He feels fatigued and has some dyspnea, describing it as 'a little rough'. No recent bleeding episodes have occurred, which was a previous concern when considering anticoagulation therapy.  He is currently taking Eliquis  to prevent stroke due to atrial fibrillation.  His white blood cell count and platelet levels have been fluctuating but are not significantly different from three months ago. He has not had an iron  infusion since September. His iron   levels have been repeated, but results are pending.  He has an upcoming appointment at [facility] for further evaluation to determine if his condition could be related to cancer, although it is unclear at this time.       Review of Systems  Constitutional:  Positive for malaise/fatigue and weight loss. Negative for chills, diaphoresis and fever.  HENT:  Negative for nosebleeds and sore throat.   Eyes:  Negative for double vision.  Respiratory:  Negative for cough, hemoptysis, sputum production, shortness of breath and wheezing.   Cardiovascular:  Negative for chest pain, palpitations, orthopnea and leg swelling.  Gastrointestinal:  Negative for abdominal pain, blood in stool, constipation, diarrhea, heartburn, melena, nausea and vomiting.  Genitourinary:  Negative for dysuria, frequency and urgency.  Musculoskeletal:  Positive for back pain and joint pain.  Skin: Negative.  Negative for itching and rash.  Neurological:  Negative for dizziness, tingling, focal weakness, weakness and headaches.  Endo/Heme/Allergies:  Does not bruise/bleed easily.  Psychiatric/Behavioral:  Negative for depression. The patient is not nervous/anxious and does not have insomnia.      MEDICAL HISTORY:  Past Medical History:  Diagnosis Date   Actinic keratosis    Allergic state    Arthritis    Asthma    B12 deficiency    Basal cell carcinoma 03/11/2022   left calf, Mohs 04/20/22   BCC (basal cell carcinoma) 04/06/2022   left forearm, tx'd with EDC   Complication of anesthesia    Difficult to wake up with anesthesia from colonoscopy   COPD (chronic obstructive pulmonary disease) (HCC)  Coronary artery disease    Dysrhythmia    Edema    GERD (gastroesophageal reflux disease)    Headache    History of kidney stones    Hypertension    Palpitations    Pulmonary nodules    Sleep apnea    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 SCCis, hypertrophic. Mohs 04/19/2023    SURGICAL  HISTORY: Past Surgical History:  Procedure Laterality Date   CHOLECYSTECTOMY     COLONOSCOPY     COLONOSCOPY WITH PROPOFOL  N/A 04/09/2016   Procedure: COLONOSCOPY WITH PROPOFOL ;  Surgeon: Gladis RAYMOND Mariner, MD;  Location: [facility] ENDOSCOPY;  Service: Endoscopy;  Laterality: N/A;   COLONOSCOPY WITH PROPOFOL  N/A 01/05/2021   Procedure: COLONOSCOPY WITH PROPOFOL ;  Surgeon: Toledo, Ladell POUR, MD;  Location: [facility] ENDOSCOPY;  Service: Gastroenterology;  Laterality: N/A;   COLONOSCOPY WITH PROPOFOL  N/A 12/20/2022   Procedure: COLONOSCOPY WITH PROPOFOL ;  Surgeon: Maryruth Ole DASEN, MD;  Location: [facility] ENDOSCOPY;  Service: Endoscopy;  Laterality: N/A;   COLONOSCOPY WITH PROPOFOL  N/A 01/09/2024   Procedure: COLONOSCOPY WITH PROPOFOL ;  Surgeon: Maryruth Ole DASEN, MD;  Location: [facility] ENDOSCOPY;  Service: Endoscopy;  Laterality: N/A;   ESOPHAGOGASTRODUODENOSCOPY (EGD) WITH PROPOFOL  N/A 01/05/2021   Procedure: ESOPHAGOGASTRODUODENOSCOPY (EGD) WITH PROPOFOL ;  Surgeon: Toledo, Ladell POUR, MD;  Location: [facility] ENDOSCOPY;  Service: Gastroenterology;  Laterality: N/A;   ESOPHAGOGASTRODUODENOSCOPY (EGD) WITH PROPOFOL  N/A 12/20/2022   Procedure: ESOPHAGOGASTRODUODENOSCOPY (EGD) WITH PROPOFOL ;  Surgeon: Maryruth Ole DASEN, MD;  Location: [facility] ENDOSCOPY;  Service: Endoscopy;  Laterality: N/A;   HEMOSTASIS CLIP PLACEMENT  01/09/2024   Procedure: CONTROL OF HEMORRHAGE, GI TRACT, ENDOSCOPIC, BY CLIPPING OR OVERSEWING;  Surgeon: Maryruth Ole DASEN, MD;  Location: [facility] ENDOSCOPY;  Service: Endoscopy;;   IR BONE MARROW BIOPSY & ASPIRATION  06/06/2024   TEE WITHOUT CARDIOVERSION N/A 02/21/2019   Procedure: TRANSESOPHAGEAL ECHOCARDIOGRAM (TEE);  Surgeon: Hester Wolm PARAS, MD;  Location: [facility] ORS;  Service: Cardiovascular;  Laterality: N/A;    SOCIAL HISTORY: Social History   Socioeconomic History   Marital status: Married    Spouse name: Not on file   Number of children: Not on file   Years of education: Not on file    Highest education level: Not on file  Occupational History   Not on file  Tobacco Use   Smoking status: Former    Current packs/day: 0.00    Average packs/day: 1.5 packs/day for 46.0 years (69.0 ttl pk-yrs)    Types: Cigarettes    Start date: 09/02/1971    Quit date: 09/01/2017    Years since quitting: 7.0   Smokeless tobacco: Never  Vaping Use   Vaping status: Never Used  Substance and Sexual Activity   Alcohol use: No   Drug use: No   Sexual activity: Yes    Birth control/protection: None  Other Topics Concern   Not on file  Social History Narrative   46 years smoking; quit 2016; no alcohol; lives in [place]. Careers adviser. 1 living daughter-; 5 mins away.    Social Drivers of Corporate Investment Banker Strain: Low Risk  (07/12/2024)   Received [facility]   Overall Financial Resource Strain (CARDIA)    Difficulty of Paying Living Expenses: Not hard at all  Food Insecurity: No Food Insecurity (07/12/2024)   Received from [facility]   Hunger Vital Sign    Within the past 12 months, you worried that your food would run out before you got  the money to buy more.: Never true    Within the past 12 months, the food you bought just didn't last and you didn't have money to get more.: Never true  Transportation Needs: No Transportation Needs (07/12/2024)   Received from [facility] - Transportation    In the past 12 months, has lack of transportation kept you from medical appointments or from getting medications?: No    Lack of Transportation (Non-Medical): No  Physical Activity: Not on file  Stress: Not on file  Social Connections: Not on file  Intimate Partner Violence: Not At Risk (03/26/2024)   Humiliation, Afraid, Rape, and Kick questionnaire    Fear of Current or Ex-Partner: No    Emotionally Abused: No    Physically Abused: No    Sexually Abused: No    FAMILY HISTORY: Family History  Problem Relation  Age of Onset   Colon cancer Father 53       died of cancer   Stomach cancer Father    Cancer - Colon Father    Hypertension Mother    Prostate cancer Neg Hx    Bladder Cancer Neg Hx    Kidney cancer Neg Hx     ALLERGIES:  is allergic to amlodipine and codeine.  MEDICATIONS:  Current Outpatient Medications  Medication Sig Dispense Refill   ADVAIR DISKUS 250-50 MCG/DOSE AEPB SMARTSIG:1 Inhalation Via Inhaler Every 12 Hours     albuterol  (PROVENTIL  HFA;VENTOLIN  HFA) 108 (90 Base) MCG/ACT inhaler Inhale 2 puffs into the lungs every 6 (six) hours as needed for wheezing or shortness of breath.     apixaban  (ELIQUIS ) 5 MG TABS tablet Take 5 mg by mouth 2 (two) times daily.     Cholecalciferol  (VITAMIN D3) 50 MCG (2000 UT) capsule Take 2,000 Units by mouth daily.      hydroxychloroquine (PLAQUENIL) 200 MG tablet Take 200 mg by mouth 2 (two) times daily.     INCRUSE ELLIPTA  62.5 MCG/INH AEPB Inhale 1 puff into the lungs daily.     mirtazapine  (REMERON ) 7.5 MG tablet Take 7.5 mg by mouth at bedtime as needed (anxiousness/rest).     pantoprazole  (PROTONIX ) 40 MG tablet Take 40 mg by mouth 2 (two) times a day.     potassium chloride  (KLOR-CON ) 10 MEQ tablet Take 10 mEq by mouth daily.     telmisartan (MICARDIS) 80 MG tablet Take 40 mg by mouth daily.     ursodiol (ACTIGALL) 300 MG capsule Take 600 mg by mouth 2 (two) times daily.     vitamin B-12 (CYANOCOBALAMIN ) 1000 MCG tablet Take 1,000 mcg by mouth daily.     LIVDELZI 10 MG CAPS Take 1 capsule by mouth daily.     No current facility-administered medications for this visit.     PHYSICAL EXAMINATION:   Vitals:   09/13/24 0950  BP: 131/68  Pulse: 66  Resp: 20  Temp: (!) 97.3 F (36.3 C)  SpO2: 98%    Filed Weights   09/13/24 0950  Weight: 176 lb 1.6 oz (79.9 kg)     Physical Exam Vitals and nursing note reviewed.  HENT:     Head: Normocephalic and atraumatic.     Mouth/Throat:     Pharynx: Oropharynx is clear.  Eyes:      Extraocular Movements: Extraocular movements intact.     Pupils: Pupils are equal, round, and reactive to light.  Cardiovascular:     Rate and Rhythm: Normal rate and regular rhythm.  Pulmonary:     Comments: Decreased breath sounds bilaterally.  Abdominal:     Palpations: Abdomen is soft.  Musculoskeletal:        General: Normal range of motion.     Cervical back: Normal range of motion.  Skin:    General: Skin is warm.  Neurological:     General: No focal deficit present.     Mental Status: He is alert and oriented to person, place, and time.  Psychiatric:        Behavior: Behavior normal.        Judgment: Judgment normal.      LABORATORY DATA:  I have reviewed the data as listed Lab Results  Component Value Date   WBC 2.2 (L) 09/13/2024   HGB 10.1 (L) 09/13/2024   HCT 31.2 (L) 09/13/2024   MCV 83.2 09/13/2024   PLT 99 (L) 09/13/2024   Recent Labs    06/26/24 1304 07/03/24 1440 09/13/24 0954  NA 136 135 141  K 3.8 3.7 3.9  CL 107 104 108  CO2 24 23 23   GLUCOSE 107* 89 101*  BUN 12 9 13   CREATININE 1.03 0.98 1.04  CALCIUM  8.5* 8.4* 8.9  GFRNONAA >60 >60 >60  PROT 6.4* 6.3* 6.5  ALBUMIN 3.5 3.4* 3.9  AST 25 19 19   ALT 17 14 11   ALKPHOS 215* 231* 165*  BILITOT 1.0 0.8 0.7     CT CHEST LUNG CA SCREEN LOW DOSE W/O CM Result Date: 09/10/2024 CLINICAL DATA:  Former 42 pack-year smoker, quit 2011. EXAM: CT CHEST WITHOUT CONTRAST LOW-DOSE FOR LUNG CANCER SCREENING TECHNIQUE: Multidetector CT imaging of the chest was performed following the standard protocol without IV contrast. RADIATION DOSE REDUCTION: This exam was performed according to the departmental dose-optimization program which includes automated exposure control, adjustment of the mA and/or kV according to patient size and/or use of iterative reconstruction technique. COMPARISON:  12/22/2022. FINDINGS: Cardiovascular: Atherosclerotic calcification of the aorta, aortic valve and coronary arteries. Heart  is enlarged. No pericardial effusion. Mediastinum/Nodes: No pathologically enlarged mediastinal or axillary lymph nodes. Hilar regions are difficult to definitively evaluate without IV contrast. Esophagus is grossly unremarkable. Lungs/Pleura: Centrilobular and paraseptal emphysema. 21.1 mm subpleural plaque-like consolidation in the posterolateral left lower lobe (3/183), with indolent enlargement from 07/04/2019 and new from 10/30/2014. Lesion was hypermetabolic on PET 04/17/2020. New 9.8 mm apical segment right upper lobe nodule (3/50). Additional pulmonary nodules measure 7.8 mm or less in size, as before. No pleural fluid. Airway is unremarkable. Upper Abdomen: Spleen appears enlarged but is incompletely imaged. Visualized portions of the liver, adrenal glands, left kidney, spleen, pancreas, stomach and bowel are otherwise grossly unremarkable. No upper abdominal adenopathy. Musculoskeletal: Degenerative changes in the spine.  Osteopenia. IMPRESSION: 1. New 9.8 mm apical segment right upper lobe nodule. Plaque-like subpleural consolidation in the left lower lobe with indolent enlargement from 07/04/2019 and new from 10/30/2014. Lesion was hypermetabolic on PET 04/17/2020. Lung-RADS 4B, suspicious. Additional imaging evaluation or consultation with Pulmonology or Thoracic Surgery recommended. These results will be called to the ordering clinician or representative by the Radiologist Assistant, and communication documented in the PACS or [employer]. 2. Spleen appears enlarged but is incompletely imaged. 3. Aortic atherosclerosis (ICD10-I70.0). Coronary artery calcification. 4.  Emphysema (ICD10-J43.9). Electronically Signed   By: Newell Eke M.D.   On: 09/10/2024 09:37    ASSESSMENT & PLAN:   Symptomatic anemia # Pancytopenia-with predominant anemia [progressively worse anemia since MAY 2024; since Jan 2025- Mild moderate thrombocytopenia/leukopenia-neutropenia/lymphopenia]  BONE MARROW BIOPSY: AUG  2025- - Normocellular bone marrow with equivocal erythroid and megakaryocytic dyspoiesis  There is slight nuclear irregularity to the erythroid precursors; however, definitive dysplasia is not identified.  Likewise, the megakaryocytes show a mild amount of hyper lobulation, but overall they are adequate in number and without definitive morphologic evidence of dysplasia.  The myeloid series is unremarkable.  CD34 focally highlights minimal increased blasts (still under 5%) with focal minimal clusters (2-3) suggestive of but not definitive for abnormally localized immature precursors.  A hypercellularity is not present.  The overall findings could be reactive in nature however some of the findings are suspicious for possible MDS but not morphologically pathognomonic. NGS- NEGATIVE.  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 Currently awaiting further evaluation at [facility] for a second opinion on dec 10th- Dr.Decastro.   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  Etiology of iron  deficiency unclear-question related to occult bleeding/as recently on Eliquis  [currently patient is off Eliquis ].  Proceed with Venofer .   # History of autoimmune liver disease primary biliary [given elevated alkaline phosphatase -March 2024 status post liver biopsy-fibrosis no cirrhosis; [facility] on [facility]]; [facility]-March 2025-colonoscopyMRI- March 2024- no cirrhosis/splenomegaly. Discussed with GI re: capsule.   # A.fib  [Dr. Wilburn; [facility]- Cards]-given the ongoing iron  deficiency-suspect chronic/occult GI bleed.  Discussed with cardiology currently on Eliquis - monitor closely given the component of iron  deficiency however- NO  active bleeding noted.  # Rheumatoid arthritis- [Dr.Patel]-on hydroxychloroquine- stable.   # OSA-CPAP- COPD [[facility]-P[facility]]- stable-   # DISPOSITION: # add BNP to today labs # cancel Blood tomorrow # Follow up in 1 month- 2-3 days PRIOR- labs- cbc/cmp; possible venofer - HOLD tube D-2 possible 1 unit Blood-  Dr.B  Addendum: Venofer  weekly x 2       Cindy JONELLE Joe, MD 09/13/2024 8:55 PM

## 2024-09-14 ENCOUNTER — Inpatient Hospital Stay

## 2024-09-14 LAB — ERYTHROPOIETIN: Erythropoietin: 54.4 m[IU]/mL — ABNORMAL HIGH (ref 2.6–18.5)

## 2024-09-25 ENCOUNTER — Inpatient Hospital Stay

## 2024-09-25 VITALS — BP 107/50 | HR 77 | Temp 96.6°F | Resp 18

## 2024-09-25 DIAGNOSIS — D649 Anemia, unspecified: Secondary | ICD-10-CM | POA: Diagnosis not present

## 2024-09-25 MED ORDER — SODIUM CHLORIDE 0.9% FLUSH
10.0000 mL | Freq: Once | INTRAVENOUS | Status: AC | PRN
  Administered 2024-09-25: 14:00:00 10 mL
  Filled 2024-09-25: qty 10

## 2024-09-25 MED ORDER — IRON SUCROSE 20 MG/ML IV SOLN
200.0000 mg | Freq: Once | INTRAVENOUS | Status: AC
  Administered 2024-09-25: 13:00:00 200 mg via INTRAVENOUS
  Filled 2024-09-25: qty 10

## 2024-10-02 ENCOUNTER — Inpatient Hospital Stay

## 2024-10-02 VITALS — BP 106/55 | HR 79 | Temp 97.8°F | Resp 18

## 2024-10-02 DIAGNOSIS — D649 Anemia, unspecified: Secondary | ICD-10-CM | POA: Diagnosis not present

## 2024-10-02 MED ORDER — SODIUM CHLORIDE 0.9% FLUSH
10.0000 mL | Freq: Once | INTRAVENOUS | Status: AC | PRN
  Administered 2024-10-02: 10 mL
  Filled 2024-10-02: qty 10

## 2024-10-02 MED ORDER — IRON SUCROSE 20 MG/ML IV SOLN
200.0000 mg | Freq: Once | INTRAVENOUS | Status: AC
  Administered 2024-10-02: 200 mg via INTRAVENOUS
  Filled 2024-10-02: qty 10

## 2024-10-02 NOTE — Progress Notes (Signed)
Patient tolerated Venofer infusion well, no questions/concerns voiced. Patient stable at discharge. AVS given.   ?

## 2024-10-02 NOTE — Patient Instructions (Signed)

## 2024-10-12 ENCOUNTER — Other Ambulatory Visit: Payer: Self-pay

## 2024-10-12 DIAGNOSIS — D649 Anemia, unspecified: Secondary | ICD-10-CM

## 2024-10-15 ENCOUNTER — Inpatient Hospital Stay: Admitting: Internal Medicine

## 2024-10-15 ENCOUNTER — Encounter: Payer: Self-pay | Admitting: Nurse Practitioner

## 2024-10-15 ENCOUNTER — Inpatient Hospital Stay: Attending: Internal Medicine

## 2024-10-15 ENCOUNTER — Inpatient Hospital Stay

## 2024-10-15 ENCOUNTER — Inpatient Hospital Stay: Admitting: Nurse Practitioner

## 2024-10-15 VITALS — BP 105/57 | HR 70 | Temp 97.5°F | Ht 67.0 in | Wt 178.0 lb

## 2024-10-15 VITALS — BP 101/59 | Resp 20

## 2024-10-15 DIAGNOSIS — D649 Anemia, unspecified: Secondary | ICD-10-CM | POA: Insufficient documentation

## 2024-10-15 DIAGNOSIS — Z85828 Personal history of other malignant neoplasm of skin: Secondary | ICD-10-CM | POA: Diagnosis not present

## 2024-10-15 DIAGNOSIS — E611 Iron deficiency: Secondary | ICD-10-CM | POA: Diagnosis not present

## 2024-10-15 DIAGNOSIS — Z800 Family history of malignant neoplasm of digestive organs: Secondary | ICD-10-CM | POA: Insufficient documentation

## 2024-10-15 DIAGNOSIS — J4489 Other specified chronic obstructive pulmonary disease: Secondary | ICD-10-CM | POA: Insufficient documentation

## 2024-10-15 DIAGNOSIS — Z86008 Personal history of in-situ neoplasm of other site: Secondary | ICD-10-CM | POA: Insufficient documentation

## 2024-10-15 DIAGNOSIS — Z87891 Personal history of nicotine dependence: Secondary | ICD-10-CM | POA: Diagnosis not present

## 2024-10-15 DIAGNOSIS — I4891 Unspecified atrial fibrillation: Secondary | ICD-10-CM | POA: Diagnosis not present

## 2024-10-15 DIAGNOSIS — D509 Iron deficiency anemia, unspecified: Secondary | ICD-10-CM

## 2024-10-15 DIAGNOSIS — D696 Thrombocytopenia, unspecified: Secondary | ICD-10-CM

## 2024-10-15 DIAGNOSIS — M069 Rheumatoid arthritis, unspecified: Secondary | ICD-10-CM | POA: Insufficient documentation

## 2024-10-15 DIAGNOSIS — D709 Neutropenia, unspecified: Secondary | ICD-10-CM

## 2024-10-15 DIAGNOSIS — G4733 Obstructive sleep apnea (adult) (pediatric): Secondary | ICD-10-CM | POA: Diagnosis not present

## 2024-10-15 LAB — CBC WITH DIFFERENTIAL (CANCER CENTER ONLY)
Abs Immature Granulocytes: 0 K/uL (ref 0.00–0.07)
Basophils Absolute: 0 K/uL (ref 0.0–0.1)
Basophils Relative: 1 %
Eosinophils Absolute: 0 K/uL (ref 0.0–0.5)
Eosinophils Relative: 1 %
HCT: 27.2 % — ABNORMAL LOW (ref 39.0–52.0)
Hemoglobin: 8.2 g/dL — ABNORMAL LOW (ref 13.0–17.0)
Immature Granulocytes: 0 %
Lymphocytes Relative: 19 %
Lymphs Abs: 0.4 K/uL — ABNORMAL LOW (ref 0.7–4.0)
MCH: 25.3 pg — ABNORMAL LOW (ref 26.0–34.0)
MCHC: 30.1 g/dL (ref 30.0–36.0)
MCV: 84 fL (ref 80.0–100.0)
Monocytes Absolute: 0.3 K/uL (ref 0.1–1.0)
Monocytes Relative: 12 %
Neutro Abs: 1.5 K/uL — ABNORMAL LOW (ref 1.7–7.7)
Neutrophils Relative %: 67 %
Platelet Count: 94 K/uL — ABNORMAL LOW (ref 150–400)
RBC: 3.24 MIL/uL — ABNORMAL LOW (ref 4.22–5.81)
RDW: 15.3 % (ref 11.5–15.5)
WBC Count: 2.3 K/uL — ABNORMAL LOW (ref 4.0–10.5)
nRBC: 0 % (ref 0.0–0.2)

## 2024-10-15 LAB — CMP (CANCER CENTER ONLY)
ALT: 8 U/L (ref 0–44)
AST: 17 U/L (ref 15–41)
Albumin: 3.6 g/dL (ref 3.5–5.0)
Alkaline Phosphatase: 155 U/L — ABNORMAL HIGH (ref 38–126)
Anion gap: 9 (ref 5–15)
BUN: 11 mg/dL (ref 8–23)
CO2: 22 mmol/L (ref 22–32)
Calcium: 8.6 mg/dL — ABNORMAL LOW (ref 8.9–10.3)
Chloride: 105 mmol/L (ref 98–111)
Creatinine: 1.06 mg/dL (ref 0.61–1.24)
GFR, Estimated: >60 mL/min
Glucose, Bld: 121 mg/dL — ABNORMAL HIGH (ref 70–99)
Potassium: 3.8 mmol/L (ref 3.5–5.1)
Sodium: 137 mmol/L (ref 135–145)
Total Bilirubin: 0.5 mg/dL (ref 0.0–1.2)
Total Protein: 6 g/dL — ABNORMAL LOW (ref 6.5–8.1)

## 2024-10-15 LAB — SAMPLE TO BLOOD BANK

## 2024-10-15 MED ORDER — IRON SUCROSE 20 MG/ML IV SOLN
200.0000 mg | Freq: Once | INTRAVENOUS | Status: AC
  Administered 2024-10-15: 200 mg via INTRAVENOUS
  Filled 2024-10-15: qty 10

## 2024-10-15 NOTE — Progress Notes (Signed)
 [facility] CONSULT NOTE  Patient Care Team: [name], MD as PCP - General (Internal Medicine) [name], MD as Consulting Physician (Oncology)  CHIEF COMPLAINTS/PURPOSE OF CONSULTATION: ANEMIA  HEMATOLOGY HISTORY  # ANEMIA[Hb; MCV-platelets- WBC; Iron  sat; ferritin;  GFR- CT/US ; EGD/colonoscopy-   Latest Reference Range & Units 10/27/23 16:24  WBC 4.0 - 10.5 K/uL 3.8 (L)  RBC 4.22 - 5.81 MIL/uL 4.23  Hemoglobin 13.0 - 17.0 g/dL 87.5 (L)  HCT 60.9 - 47.9 % 38.2 (L)  MCV 80.0 - 100.0 fL 90.3  MCH 26.0 - 34.0 pg 29.3  MCHC 30.0 - 36.0 g/dL 67.4  RDW 88.4 - 84.4 % 13.6  Platelets 150 - 400 K/uL 90 (L)  (L): Data is abnormally low  HISTORY OF PRESENTING ILLNESS: Patient ambulating-independently .  Accompanied by his wife.  [name] 69 y.o.  male pleasant patient with history of COPD-OSA- CPAP- CAD/A.fib, use of eliquis , PBC- no cirrhosis or hepatosplenomegaly, returns to clinic for follow up of his anemia, thrombocytopenia with recent bone marrow biopsy. In interim he was seen by [facility] hematology for second opinion. He feels poorly today. Endorses shortness of breath with exertion, fatigue. Endorses black stools intermittently. He continues eliquis .     Review of Systems  Constitutional:  Positive for malaise/fatigue. Negative for chills, diaphoresis, fever and weight loss.  HENT:  Negative for nosebleeds and sore throat.   Eyes:  Negative for double vision.  Respiratory:  Positive for shortness of breath. Negative for cough, hemoptysis, sputum production and wheezing.   Cardiovascular:  Negative for chest pain, palpitations, orthopnea and leg swelling.  Gastrointestinal:  Positive for melena. Negative for abdominal pain, blood in stool, constipation, diarrhea, heartburn, nausea and vomiting.  Genitourinary:  Negative for dysuria, frequency and urgency.  Musculoskeletal:  Positive for back pain and joint pain.  Skin:  Negative for  itching and rash.  Neurological:  Negative for dizziness, tingling, focal weakness, weakness and headaches.  Endo/Heme/Allergies:  Does not bruise/bleed easily.  Psychiatric/Behavioral:  Negative for depression. The patient is nervous/anxious. The patient does not have insomnia.     MEDICAL HISTORY:  Past Medical History:  Diagnosis Date   Actinic keratosis    Allergic state    Arthritis    Asthma    B12 deficiency    Basal cell carcinoma 03/11/2022   left calf, Mohs 04/20/22   BCC (basal cell carcinoma) 04/06/2022   left forearm, tx'd with EDC   Complication of anesthesia    Difficult to wake up with anesthesia from colonoscopy   COPD (chronic obstructive pulmonary disease) (HCC)    Coronary artery disease    Dysrhythmia    Edema    GERD (gastroesophageal reflux disease)    Headache    History of kidney stones    Hypertension    Palpitations    Pulmonary nodules    Sleep apnea    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 SCCis, hypertrophic. Mohs 04/19/2023    SURGICAL HISTORY: Past Surgical History:  Procedure Laterality Date   CHOLECYSTECTOMY     COLONOSCOPY     COLONOSCOPY WITH PROPOFOL  N/A 04/09/2016   Procedure: COLONOSCOPY WITH PROPOFOL ;  Surgeon: Gladis RAYMOND Mariner, MD;  Location[facility] ENDOSCOPY;  Service: Endoscopy;  Laterality: N/A;   COLONOSCOPY WITH PROPOFOL  N/A 01/05/2021   Procedure: COLONOSCOPY WITH PROPOFOL ;  Surgeon: Toledo, Ladell POUR, MD;  Location: [facility] ENDOSCOPY;  Service: Gastroenterology;  Laterality: N/A;   COLONOSCOPY WITH PROPOFOL   N/A 12/20/2022   Procedure: COLONOSCOPY WITH PROPOFOL ;  Surgeon: Maryruth Ole DASEN, MD;  [facility] ENDOSCOPY;  Service: Endoscopy;  Laterality: N/A;   COLONOSCOPY WITH PROPOFOL  N/A 01/09/2024   Procedure: COLONOSCOPY WITH PROPOFOL ;  Surgeon: Maryruth Ole DASEN, MD;  Location: [facility] ENDOSCOPY;  Service: Endoscopy;  Laterality: N/A;   ESOPHAGOGASTRODUODENOSCOPY (EGD) WITH PROPOFOL  N/A 01/05/2021    Procedure: ESOPHAGOGASTRODUODENOSCOPY (EGD) WITH PROPOFOL ;  Surgeon: Toledo, Ladell POUR, MD;  Location: [facility] ENDOSCOPY;  Service: Gastroenterology;  Laterality: N/A;   ESOPHAGOGASTRODUODENOSCOPY (EGD) WITH PROPOFOL  N/A 12/20/2022   Procedure: ESOPHAGOGASTRODUODENOSCOPY (EGD) WITH PROPOFOL ;  Surgeon: Maryruth Ole DASEN, MD;  Location: [facility] ENDOSCOPY;  Service: Endoscopy;  Laterality: N/A;   HEMOSTASIS CLIP PLACEMENT  01/09/2024   Procedure: CONTROL OF HEMORRHAGE, GI TRACT, ENDOSCOPIC, BY CLIPPING OR OVERSEWING;  Surgeon: Maryruth Ole DASEN, MD;  Location: [facility] ENDOSCOPY;  Service: Endoscopy;;   IR BONE MARROW BIOPSY & ASPIRATION  06/06/2024   TEE WITHOUT CARDIOVERSION N/A 02/21/2019   Procedure: TRANSESOPHAGEAL ECHOCARDIOGRAM (TEE);  Surgeon: Hester Wolm PARAS, MD;  Location: [facility] ORS;  Service: Cardiovascular;  Laterality: N/A;    SOCIAL HISTORY: Social History   Socioeconomic History   Marital status: Married    Spouse name: Not on file   Number of children: Not on file   Years of education: Not on file   Highest education level: Not on file  Occupational History   Not on file  Tobacco Use   Smoking status: Former    Current packs/day: 0.00    Average packs/day: 1.5 packs/day for 46.0 years (69.0 ttl pk-yrs)    Types: Cigarettes    Start date: 09/02/1971    Quit date: 09/01/2017    Years since quitting: 7.1   Smokeless tobacco: Never  Vaping Use   Vaping status: Never Used  Substance and Sexual Activity   Alcohol use: No   Drug use: No   Sexual activity: Yes    Birth control/protection: None  Other Topics Concern   Not on file  Social History Narrative   46 years smoking; quit 2016; no alcohol; lives in [place]. Careers adviser. 1 living daughter-; 5 mins away.    Social Drivers of Health   Tobacco Use: Medium Risk (10/15/2024)   Patient History    Smoking Tobacco Use: Former    Smokeless Tobacco Use: Never    Passive Exposure: Not on file  Financial Resource Strain: Low  Risk  (07/12/2024)   Received from [facility]   Overall Financial Resource Strain (CARDIA)    Difficulty of Paying Living Expenses: Not hard at all  Food Insecurity: No Food Insecurity (07/12/2024)   Received from [facility]   Epic    Within the past 12 months, you worried that your food would run out before you got the money to buy more.: Never true    Within the past 12 months, the food you bought just didn't last and you didn't have money to get more.: Never true  Transportation Needs: No Transportation Needs (07/12/2024)   Received from [facility] - Transportation    In the past 12 months, has lack of transportation kept you from medical appointments or from getting medications?: No    Lack of Transportation (Non-Medical): No  Physical Activity: Not on file  Stress: Not on file  Social Connections: Not on file  Intimate Partner Violence: Not At Risk (03/26/2024)   Epic    Fear of Current or Ex-Partner: No  Emotionally Abused: No    Physically Abused: No    Sexually Abused: No  Depression (PHQ2-9): Low Risk (10/15/2024)   Depression (PHQ2-9)    PHQ-2 Score: 0  Alcohol Screen: Not on file  Housing: Low Risk  (07/12/2024)   Received from [facility]   Epic    In the last 12 months, was there a time when you were not able to pay the mortgage or rent on time?: No    In the past 12 months, how many times have you moved where you were living?: 0    At any time in the past 12 months, were you homeless or living in a shelter (including now)?: No  Utilities: Not At Risk (07/12/2024)   Received from [facility]   Epic    In the past 12 months has the electric, gas, oil, or water  company threatened to shut off services in your home?: No  Health Literacy: Not on file    FAMILY HISTORY: Family History  Problem Relation Age of Onset   Colon cancer Father 67       died of cancer   Stomach  cancer Father    Cancer - Colon Father    Hypertension Mother    Prostate cancer Neg Hx    Bladder Cancer Neg Hx    Kidney cancer Neg Hx     ALLERGIES:  is allergic to amlodipine and codeine.  MEDICATIONS:  Current Outpatient Medications  Medication Sig Dispense Refill   ADVAIR DISKUS 250-50 MCG/DOSE AEPB SMARTSIG:1 Inhalation Via Inhaler Every 12 Hours     albuterol  (PROVENTIL  HFA;VENTOLIN  HFA) 108 (90 Base) MCG/ACT inhaler Inhale 2 puffs into the lungs every 6 (six) hours as needed for wheezing or shortness of breath.     apixaban  (ELIQUIS ) 5 MG TABS tablet Take 5 mg by mouth 2 (two) times daily.     Cholecalciferol  (VITAMIN D3) 50 MCG (2000 UT) capsule Take 2,000 Units by mouth daily.      hydroxychloroquine (PLAQUENIL) 200 MG tablet Take 200 mg by mouth 2 (two) times daily.     INCRUSE ELLIPTA  62.5 MCG/INH AEPB Inhale 1 puff into the lungs daily.     LIVDELZI 10 MG CAPS Take 1 capsule by mouth daily.     mirtazapine  (REMERON ) 7.5 MG tablet Take 7.5 mg by mouth at bedtime as needed (anxiousness/rest).     pantoprazole  (PROTONIX ) 40 MG tablet Take 40 mg by mouth 2 (two) times a day.     potassium chloride  (KLOR-CON ) 10 MEQ tablet Take 10 mEq by mouth daily.     telmisartan (MICARDIS) 80 MG tablet Take 40 mg by mouth daily.     ursodiol (ACTIGALL) 300 MG capsule Take 600 mg by mouth 2 (two) times daily.     vitamin B-12 (CYANOCOBALAMIN ) 1000 MCG tablet Take 1,000 mcg by mouth daily.     No current facility-administered medications for this visit.    PHYSICAL EXAMINATION: Vitals:   10/15/24 1412  BP: (!) 105/57  Pulse: 70  Temp: (!) 97.5 F (36.4 C)  SpO2: 100%   Filed Weights   10/15/24 1412  Weight: 178 lb (80.7 kg)   Physical Exam Vitals reviewed.  Constitutional:      Appearance: He is not ill-appearing.  HENT:     Head: Normocephalic and atraumatic.  Cardiovascular:     Rate and Rhythm: Normal rate and regular rhythm.  Pulmonary:     Comments: Decreased breath  sounds bilaterally.  Abdominal:     General: There is no distension.     Palpations: Abdomen is soft.     Tenderness: There is no abdominal tenderness.  Musculoskeletal:        General: Normal range of motion.  Lymphadenopathy:     Cervical: No cervical adenopathy.  Skin:    General: Skin is warm.     Coloration: Skin is not pale.  Neurological:     General: No focal deficit present.     Mental Status: He is alert and oriented to person, place, and time.  Psychiatric:        Mood and Affect: Mood normal.        Behavior: Behavior normal.      LABORATORY DATA:  I have reviewed the data as listed Lab Results  Component Value Date   WBC 2.3 (L) 10/15/2024   HGB 8.2 (L) 10/15/2024   HCT 27.2 (L) 10/15/2024   MCV 84.0 10/15/2024   PLT 94 (L) 10/15/2024   Recent Labs    07/03/24 1440 09/13/24 0954 10/15/24 1401  NA 135 141 137  K 3.7 3.9 3.8  CL 104 108 105  CO2 23 23 22   GLUCOSE 89 101* 121*  BUN 9 13 11   CREATININE 0.98 1.04 1.06  CALCIUM  8.4* 8.9 8.6*  GFRNONAA >60 >60 >60  PROT 6.3* 6.5 6.0*  ALBUMIN 3.4* 3.9 3.6  AST 19 19 17   ALT 14 11 8   ALKPHOS 231* 165* 155*  BILITOT 0.8 0.7 0.5   Iron /TIBC/Ferritin/ %Sat    Component Value Date/Time   IRON  25 (L) 09/13/2024 0954   TIBC 385 09/13/2024 0954   FERRITIN 20 (L) 09/13/2024 0954   IRONPCTSAT 7 (L) 09/13/2024 0954    No results found.   ASSESSMENT & PLAN:   Symptomatic anemia # Pancytopenia-with predominant anemia [progressively worse anemia since MAY 2024; since Jan 2025- Mild moderate thrombocytopenia/leukopenia-neutropenia/lymphopenia] BONE MARROW BIOPSY: AUG 2025- - Normocellular bone marrow with equivocal erythroid and megakaryocytic dyspoiesis  There is slight nuclear irregularity to the erythroid precursors; however, definitive dysplasia is not identified.  Likewise, the megakaryocytes show a mild amount of hyper lobulation, but overall they are adequate in number and without definitive  morphologic evidence of dysplasia.  The myeloid series is unremarkable.  CD34 focally highlights minimal increased blasts (still under 5%) with focal minimal clusters (2-3) suggestive of but not definitive for abnormally localized immature precursors.  A hypercellularity is not present.  The overall findings could be reactive in nature however some of the findings are suspicious for possible MDS but not morphologically pathognomonic. NGS- NEGATIVE.  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 S/p second opinion at [facility] with Dr Decastrol. Note reviewed. Recommend correcting iron  stores and if counts remain abnormal, repeating bone marrow biopsy.    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  Etiology of iron  deficiency unclear-question related to occult bleeding/as recently on Eliquis  [currently patient is off Eliquis ]. Hmg 8.2 today. He received 2 doses of venofer  in interim. Plan for 2 additional doses of venofer . Plan for transfusion for hemoglobin < 8.    # History of autoimmune liver disease primary biliary [given elevated alkaline phosphatase -March 2024 status post liver biopsy-fibrosis no cirrhosis; [facility] on [facility]]; [facility]-March 2025-colonoscopyMRI-  March 2024- no cirrhosis/splenomegaly. Recommend he follow up with Dr Maryruth for possible capsule study.    # A.fib  [Dr. Wilburn; [facility]- Cards]-given the ongoing iron  deficiency-suspect chronic/occult GI bleed.  Discussed with cardiology currently on Eliquis - monitor closely given the component of iron  deficiency however- NO active bleeding noted.   # Rheumatoid arthritis- [Dr.Patel]-on hydroxychloroquine- stable.    # OSA-CPAP- COPD  [[facility]-Pul]- stable-    # DISPOSITION: Venofer  today 1 week- lab (H&H, hold tube), venofer  4 weeks- lab (cbc, cmp, ferritin, iron  studies, hold tube),  D2 - Dr Rennie, +/- venofer  D3 +/- 1 unit pRBCs- la  No problem-specific Assessment & Plan notes found for this encounter.  Tinnie KANDICE Dawn, NP 10/15/2024

## 2024-10-15 NOTE — Patient Instructions (Signed)

## 2024-10-16 ENCOUNTER — Inpatient Hospital Stay

## 2024-10-17 ENCOUNTER — Other Ambulatory Visit: Payer: Self-pay | Admitting: *Deleted

## 2024-10-17 DIAGNOSIS — D509 Iron deficiency anemia, unspecified: Secondary | ICD-10-CM

## 2024-10-17 DIAGNOSIS — D649 Anemia, unspecified: Secondary | ICD-10-CM

## 2024-10-22 ENCOUNTER — Inpatient Hospital Stay

## 2024-10-22 VITALS — BP 108/57 | HR 72 | Temp 97.5°F | Resp 18

## 2024-10-22 DIAGNOSIS — D649 Anemia, unspecified: Secondary | ICD-10-CM | POA: Diagnosis not present

## 2024-10-22 DIAGNOSIS — D509 Iron deficiency anemia, unspecified: Secondary | ICD-10-CM

## 2024-10-22 LAB — HEMOGLOBIN AND HEMATOCRIT (CANCER CENTER ONLY)
HCT: 28.9 % — ABNORMAL LOW (ref 39.0–52.0)
Hemoglobin: 8.6 g/dL — ABNORMAL LOW (ref 13.0–17.0)

## 2024-10-22 LAB — SAMPLE TO BLOOD BANK

## 2024-10-22 MED ORDER — IRON SUCROSE 20 MG/ML IV SOLN
200.0000 mg | Freq: Once | INTRAVENOUS | Status: AC
  Administered 2024-10-22: 200 mg via INTRAVENOUS
  Filled 2024-10-22: qty 10

## 2024-10-22 NOTE — Patient Instructions (Signed)

## 2024-11-07 ENCOUNTER — Other Ambulatory Visit: Payer: Self-pay | Admitting: Specialist

## 2024-11-07 DIAGNOSIS — R918 Other nonspecific abnormal finding of lung field: Secondary | ICD-10-CM

## 2024-11-12 ENCOUNTER — Inpatient Hospital Stay

## 2024-11-13 ENCOUNTER — Inpatient Hospital Stay: Attending: Internal Medicine

## 2024-11-13 ENCOUNTER — Other Ambulatory Visit: Payer: Self-pay | Admitting: *Deleted

## 2024-11-13 DIAGNOSIS — D649 Anemia, unspecified: Secondary | ICD-10-CM

## 2024-11-13 DIAGNOSIS — D509 Iron deficiency anemia, unspecified: Secondary | ICD-10-CM

## 2024-11-13 LAB — CBC WITH DIFFERENTIAL (CANCER CENTER ONLY)
Abs Immature Granulocytes: 0.01 10*3/uL (ref 0.00–0.07)
Basophils Absolute: 0 10*3/uL (ref 0.0–0.1)
Basophils Relative: 1 %
Eosinophils Absolute: 0.1 10*3/uL (ref 0.0–0.5)
Eosinophils Relative: 2 %
HCT: 25.1 % — ABNORMAL LOW (ref 39.0–52.0)
Hemoglobin: 7.3 g/dL — ABNORMAL LOW (ref 13.0–17.0)
Immature Granulocytes: 0 %
Lymphocytes Relative: 20 %
Lymphs Abs: 0.6 10*3/uL — ABNORMAL LOW (ref 0.7–4.0)
MCH: 23.5 pg — ABNORMAL LOW (ref 26.0–34.0)
MCHC: 29.1 g/dL — ABNORMAL LOW (ref 30.0–36.0)
MCV: 80.7 fL (ref 80.0–100.0)
Monocytes Absolute: 0.4 10*3/uL (ref 0.1–1.0)
Monocytes Relative: 13 %
Neutro Abs: 1.8 10*3/uL (ref 1.7–7.7)
Neutrophils Relative %: 64 %
Platelet Count: 104 10*3/uL — ABNORMAL LOW (ref 150–400)
RBC: 3.11 MIL/uL — ABNORMAL LOW (ref 4.22–5.81)
RDW: 16.3 % — ABNORMAL HIGH (ref 11.5–15.5)
WBC Count: 2.9 10*3/uL — ABNORMAL LOW (ref 4.0–10.5)
nRBC: 0 % (ref 0.0–0.2)

## 2024-11-13 LAB — CMP (CANCER CENTER ONLY)
ALT: 10 U/L (ref 0–44)
AST: 16 U/L (ref 15–41)
Albumin: 3.7 g/dL (ref 3.5–5.0)
Alkaline Phosphatase: 185 U/L — ABNORMAL HIGH (ref 38–126)
Anion gap: 10 (ref 5–15)
BUN: 12 mg/dL (ref 8–23)
CO2: 21 mmol/L — ABNORMAL LOW (ref 22–32)
Calcium: 8.8 mg/dL — ABNORMAL LOW (ref 8.9–10.3)
Chloride: 104 mmol/L (ref 98–111)
Creatinine: 1.07 mg/dL (ref 0.61–1.24)
GFR, Estimated: >60 mL/min
Glucose, Bld: 100 mg/dL — ABNORMAL HIGH (ref 70–99)
Potassium: 4.1 mmol/L (ref 3.5–5.1)
Sodium: 135 mmol/L (ref 135–145)
Total Bilirubin: 0.8 mg/dL (ref 0.0–1.2)
Total Protein: 6 g/dL — ABNORMAL LOW (ref 6.5–8.1)

## 2024-11-13 LAB — IRON AND TIBC
Iron: 17 ug/dL — ABNORMAL LOW (ref 45–182)
Saturation Ratios: 5 % — ABNORMAL LOW (ref 17.9–39.5)
TIBC: 347 ug/dL (ref 250–450)
UIBC: 330 ug/dL

## 2024-11-13 LAB — FERRITIN: Ferritin: 23 ng/mL — ABNORMAL LOW (ref 24–336)

## 2024-11-13 LAB — PREPARE RBC (CROSSMATCH)

## 2024-11-14 ENCOUNTER — Inpatient Hospital Stay

## 2024-11-14 ENCOUNTER — Encounter: Payer: Self-pay | Admitting: Internal Medicine

## 2024-11-14 ENCOUNTER — Inpatient Hospital Stay: Admitting: Internal Medicine

## 2024-11-14 VITALS — BP 102/55 | HR 76 | Resp 18

## 2024-11-14 DIAGNOSIS — D649 Anemia, unspecified: Secondary | ICD-10-CM

## 2024-11-14 MED ORDER — IRON SUCROSE 20 MG/ML IV SOLN
200.0000 mg | Freq: Once | INTRAVENOUS | Status: AC
  Administered 2024-11-14: 200 mg via INTRAVENOUS
  Filled 2024-11-14: qty 10

## 2024-11-14 NOTE — Assessment & Plan Note (Addendum)
#   Pancytopenia-with predominant anemia [progressively worse anemia since MAY 2024; since Jan 2025- Mild moderate thrombocytopenia/leukopenia-neutropenia/lymphopenia] BONE MARROW BIOPSY: AUG 2025- - Normocellular bone marrow with equivocal erythroid and megakaryocytic dyspoiesis  There is slight nuclear irregularity to the erythroid precursors; however, definitive dysplasia is not identified.  Likewise, the megakaryocytes show a mild amount of hyper lobulation, but overall they are adequate in number and without definitive morphologic evidence of dysplasia.  The myeloid series is unremarkable.  CD34 focally highlights minimal increased blasts (still under 5%) with focal minimal clusters (2-3) suggestive of but not definitive for abnormally localized immature precursors.  A hypercellularity is not present.  The overall findings could be reactive in nature however some of the findings are suspicious for possible MDS but not morphologically pathognomonic. NGS- NEGATIVE.  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 S/p evaluation with [facility] for a second opinion on dec 10th- Dr.Decastro.   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  Hemoglobin today is 7.4.  Proceed with blood transfusion.  Also plan Venofer  weekly.  # History of autoimmune liver disease primary biliary [given elevated alkaline phosphatase -March 2024 status post liver biopsy-fibrosis no cirrhosis; [facility] on [facility]]; [facility]-March 2025-colonoscop; yMRI- March 2024- no cirrhosis/splenomegaly.  Status post capsule study await results.  # A.fib  [Dr. Wilburn; [facility]- Cards]-given the ongoing iron  deficiency-suspect chronic/occult GI bleed.   Discussed with cardiology currently on Eliquis - monitor closely given the component of iron  deficiency however- NO active bleeding noted.  # Rheumatoid arthritis- [Dr.Patel]-on hydroxychloroquine- stable.   # OSA-CPAP- COPD [[facility]-Pul]-  DEC 2025- screening LCCT- New 9.8 mm apical segment right upper lobe nodule. Plaque-like subpleural consolidation in the left lower lobe with indolent enlargement from 07/04/2019 and new from 10/30/2014. Lesion was hypermetabolic on PET 04/17/2020.awiating Pet scan- [Dr.Fleming] -   # DISPOSITION: # venofer  today #  proceed with Blood tomorrow/ please order 1 unit # weekly venofer  x 4  # Follow up with MD- in 4 weeks- 2-3 days PRIOR- labs- cbc/cmp; possible venofer - HOLD tube D-2 possible 1 unit Blood-  Dr.B

## 2024-11-14 NOTE — Progress Notes (Signed)
 Awaiting endoscopy results done yesterday by Dr. Maryruth.   Concerned with iron  levels, fatigue, breathing feels worse. Having some light headedness and dizziness, off balance.DOE.  Dr. Theotis ordered PET for this Friday.

## 2024-11-14 NOTE — Patient Instructions (Signed)

## 2024-11-14 NOTE — Progress Notes (Signed)
 Derrick Sheppard  Derrick Sheppard: [name], MD as PCP - General (Internal Medicine) [name], MD as Consulting Physician (Oncology)  CHIEF COMPLAINTS/PURPOSE OF CONSULTATION: ANEMIA   HEMATOLOGY HISTORY  # ANEMIA[Hb; MCV-platelets- WBC; Iron  sat; ferritin;  GFR- CT/US ; EGD/colonoscopy-   Latest Reference Range & Units 10/27/23 16:24  WBC 4.0 - 10.5 K/uL 3.8 (L)  RBC 4.22 - 5.81 MIL/uL 4.23  Hemoglobin 13.0 - 17.0 g/dL 87.5 (L)  HCT 60.9 - 47.9 % 38.2 (L)  MCV 80.0 - 100.0 fL 90.3  MCH 26.0 - 34.0 pg 29.3  MCHC 30.0 - 36.0 g/dL 67.4  RDW 88.4 - 84.4 % 13.6  Platelets 150 - 400 K/uL 90 (L)  (L): Data is abnormally low  HISTORY OF PRESENTING ILLNESS: Derrick ambulating-independently .  Accompanied by his wife.  Derrick Sheppard 69 y.o.  male with  pleasant Derrick with history of COPD-OSA- CPAP- CAD/A.fib prior history of eliquis ; PBC-no cirrhosis or hepatosplenomegaly is here for follow-up of anemia/thrombocytopenia/leukopenia-status post bone marrow biopsy is here for follow-up.  Discussed the use of AI scribe software for clinical Sheppard transcription with the Derrick, who gave verbal consent to proceed.  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  He has received approximately fifteen iron  infusions over the past six months, averaging two per month, with ongoing symptomatic anemia. He experiences persistent fatigue and decreased exercise tolerance, describing, I don't feel good. I don't breathe good. Hemoglobin today is 7.3 g/dL.  He denies overt gastrointestinal bleeding, including hematochezia and melena. He previously took over-the-counter iron  supplements, which caused mild gastrointestinal upset, and discontinued them when iron   infusions were initiated. He has not taken prescription iron  supplements.  He is currently on Eliquis  for atrial fibrillation, with the dose being reduced. Despite significant anemia, he has not received a blood transfusion, even when hemoglobin dropped to 6.9 g/dL in June 7974.  He underwent capsule endoscopy yesterday to evaluate for gastrointestinal bleeding; results are pending. He is scheduled for a PET scan this Friday due to a pulmonary nodule identified on the right lung, which is being monitored by another provider.     Review of Systems  Constitutional:  Positive for malaise/fatigue and weight loss. Negative for chills, diaphoresis and fever.  HENT:  Negative for nosebleeds and sore throat.   Eyes:  Negative for double vision.  Respiratory:  Negative for cough, hemoptysis, sputum production, shortness of breath and wheezing.   Cardiovascular:  Negative for chest pain, palpitations, orthopnea and leg swelling.  Gastrointestinal:  Negative for abdominal pain, blood in stool, constipation, diarrhea, heartburn, melena, nausea and vomiting.  Genitourinary:  Negative for dysuria, frequency and urgency.  Musculoskeletal:  Positive for back pain and joint pain.  Skin: Negative.  Negative for itching and rash.  Neurological:  Negative for dizziness, tingling, focal weakness, weakness and headaches.  Endo/Heme/Allergies:  Does not bruise/bleed easily.  Psychiatric/Behavioral:  Negative for depression. The Derrick is not nervous/anxious and does not have insomnia.      MEDICAL HISTORY:  Past Medical History:  Diagnosis Date   Actinic keratosis    Allergic state    Arthritis    Asthma    B12 deficiency    Basal cell carcinoma 03/11/2022   left calf, Mohs 04/20/22   BCC (basal cell carcinoma)  04/06/2022   left forearm, tx'd with EDC   Complication of anesthesia    Difficult to wake up with anesthesia from colonoscopy   COPD (chronic obstructive pulmonary disease) (HCC)    Coronary  artery disease    Dysrhythmia    Edema    GERD (gastroesophageal reflux disease)    Headache    History of kidney stones    Hypertension    Palpitations    Pulmonary nodules    Sleep apnea    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 SCCis, hypertrophic. Mohs 04/19/2023    SURGICAL HISTORY: Past Surgical History:  Procedure Laterality Date   CHOLECYSTECTOMY     COLONOSCOPY     COLONOSCOPY WITH PROPOFOL  N/A 04/09/2016   Procedure: COLONOSCOPY WITH PROPOFOL ;  Surgeon: Gladis RAYMOND Mariner, MD;  Location: [facility] ENDOSCOPY;  Service: Endoscopy;  Laterality: N/A;   COLONOSCOPY WITH PROPOFOL  N/A 01/05/2021   Procedure: COLONOSCOPY WITH PROPOFOL ;  Surgeon: Toledo, Ladell POUR, MD;  Location: [facility] ENDOSCOPY;  Service: Gastroenterology;  Laterality: N/A;   COLONOSCOPY WITH PROPOFOL  N/A 12/20/2022   Procedure: COLONOSCOPY WITH PROPOFOL ;  Surgeon: Maryruth Ole DASEN, MD;  Location: [facility] ENDOSCOPY;  Service: Endoscopy;  Laterality: N/A;   COLONOSCOPY WITH PROPOFOL  N/A 01/09/2024   Procedure: COLONOSCOPY WITH PROPOFOL ;  Surgeon: Maryruth Ole DASEN, MD;  Location: [facility] ENDOSCOPY;  Service: Endoscopy;  Laterality: N/A;   ESOPHAGOGASTRODUODENOSCOPY (EGD) WITH PROPOFOL  N/A 01/05/2021   Procedure: ESOPHAGOGASTRODUODENOSCOPY (EGD) WITH PROPOFOL ;  Surgeon: Toledo, Ladell POUR, MD;  Location: [facility] ENDOSCOPY;  Service: Gastroenterology;  Laterality: N/A;   ESOPHAGOGASTRODUODENOSCOPY (EGD) WITH PROPOFOL  N/A 12/20/2022   Procedure: ESOPHAGOGASTRODUODENOSCOPY (EGD) WITH PROPOFOL ;  Surgeon: Maryruth Ole DASEN, MD;  Location: [facility] ENDOSCOPY;  Service: Endoscopy;  Laterality: N/A;   HEMOSTASIS CLIP PLACEMENT  01/09/2024   Procedure: CONTROL OF HEMORRHAGE, GI TRACT, ENDOSCOPIC, BY CLIPPING OR OVERSEWING;  Surgeon: Maryruth Ole DASEN, MD;  Location: [facility] ENDOSCOPY;  Service: Endoscopy;;   IR BONE MARROW BIOPSY & ASPIRATION  06/06/2024   TEE WITHOUT CARDIOVERSION N/A 02/21/2019   Procedure: TRANSESOPHAGEAL  ECHOCARDIOGRAM (TEE);  Surgeon: Hester Wolm PARAS, MD;  Location: [facility] ORS;  Service: Cardiovascular;  Laterality: N/A;    SOCIAL HISTORY: Social History   Socioeconomic History   Marital status: Married    Spouse name: Not on file   Number of children: Not on file   Years of education: Not on file   Highest education level: Not on file  Occupational History   Not on file  Tobacco Use   Smoking status: Former    Current packs/day: 0.00    Average packs/day: 1.5 packs/day for 46.0 years (69.0 ttl pk-yrs)    Types: Cigarettes    Start date: 09/02/1971    Quit date: 09/01/2017    Years since quitting: 7.2   Smokeless tobacco: Never  Vaping Use   Vaping status: Never Used  Substance and Sexual Activity   Alcohol use: No   Drug use: No   Sexual activity: Yes    Birth control/protection: None  Other Topics Concern   Not on file  Social History Narrative   46 years smoking; quit 2016; no alcohol; lives in [place]. Careers adviser. 1 living daughter-; 5 mins away.    Social Drivers of Health   Tobacco Use: Medium Risk (11/14/2024)   Derrick History    Smoking Tobacco Use: Former    Smokeless Tobacco Use: Never    Passive Exposure: Not on file  Financial Resource Strain: Low Risk  (  11/11/2024)   Received from [facility]   Overall Financial Resource Strain (CARDIA)    Difficulty of Paying Living Expenses: Not hard at all  Food Insecurity: No Food Insecurity (11/11/2024)   Received from [facility]   Epic    Within the past 12 months, you worried that your food would run out before you got the money to buy more.: Never true    Within the past 12 months, the food you bought just didn't last and you didn't have money to get more.: Never true  Transportation Needs: No Transportation Needs (11/11/2024)   Received from [facility] - Transportation    In the past 12 months, has lack of transportation kept you from medical  appointments or from getting medications?: No    Lack of Transportation (Non-Medical): No  Physical Activity: Not on file  Stress: Not on file  Social Connections: Not on file  Intimate Partner Violence: Not At Risk (03/26/2024)   Epic    Fear of Current or Ex-Partner: No    Emotionally Abused: No    Physically Abused: No    Sexually Abused: No  Depression (PHQ2-9): Low Risk (11/14/2024)   Depression (PHQ2-9)    PHQ-2 Score: 0  Alcohol Screen: Not on file  Housing: Low Risk  (11/11/2024)   Received [facility]   Epic    In the last 12 months, was there a time when you were not able to pay the mortgage or rent on time?: No    In the past 12 months, how many times have you moved where you were living?: 0    At any time in the past 12 months, were you homeless or living in a shelter (including now)?: No  Utilities: Not At Risk (11/11/2024)   Received from [facility]   Epic    In the past 12 months has the electric, gas, oil, or water  company threatened to shut off services in your home?: No  Health Literacy: Not on file    FAMILY HISTORY: Family History  Problem Relation Age of Onset   Colon cancer Father 30       died of cancer   Stomach cancer Father    Cancer - Colon Father    Hypertension Mother    Prostate cancer Neg Hx    Bladder Cancer Neg Hx    Kidney cancer Neg Hx     ALLERGIES:  is allergic to amlodipine and codeine.  MEDICATIONS:  Current Outpatient Medications  Medication Sig Dispense Refill   albuterol  (PROVENTIL  HFA;VENTOLIN  HFA) 108 (90 Base) MCG/ACT inhaler Inhale 2 puffs into the lungs every 6 (six) hours as needed for wheezing or shortness of breath.     apixaban  (ELIQUIS ) 5 MG TABS tablet Take 5 mg by mouth 2 (two) times daily. (Derrick taking differently: Take 2.5 mg by mouth 2 (two) times daily.)     Cholecalciferol  (VITAMIN D3) 50 MCG (2000 UT) capsule Take 2,000 Units by mouth daily.      fluticasone -salmeterol  (WIXELA INHUB) 250-50 MCG/ACT AEPB Inhale 1 puff into the lungs in the morning and at bedtime.     hydroxychloroquine (PLAQUENIL) 200 MG tablet Take 200 mg by mouth 2 (two) times daily.     INCRUSE ELLIPTA  62.5 MCG/INH AEPB Inhale 1 puff into the lungs daily.     mirtazapine  (REMERON ) 7.5 MG tablet Take 7.5 mg by mouth at bedtime as needed (  anxiousness/rest).     pantoprazole  (PROTONIX ) 40 MG tablet Take 40 mg by mouth 2 (two) times a day.     potassium chloride  (KLOR-CON ) 10 MEQ tablet Take 10 mEq by mouth daily.     ursodiol (ACTIGALL) 300 MG capsule Take 600 mg by mouth 2 (two) times daily.     vitamin B-12 (CYANOCOBALAMIN ) 1000 MCG tablet Take 1,000 mcg by mouth daily.     No current facility-administered medications for this visit.     PHYSICAL EXAMINATION:   Vitals:   11/14/24 1327  BP: 116/64  Pulse: 81  Resp: 20  Temp: 98 F (36.7 C)  SpO2: 100%    Filed Weights   11/14/24 1327  Weight: 176 lb 12.8 oz (80.2 kg)     Physical Exam Vitals and nursing Sheppard reviewed.  HENT:     Head: Normocephalic and atraumatic.     Mouth/Throat:     Pharynx: Oropharynx is clear.  Eyes:     Extraocular Movements: Extraocular movements intact.     Pupils: Pupils are equal, round, and reactive to light.  Cardiovascular:     Rate and Rhythm: Normal rate and regular rhythm.  Pulmonary:     Comments: Decreased breath sounds bilaterally.  Abdominal:     Palpations: Abdomen is soft.  Musculoskeletal:        General: Normal range of motion.     Cervical back: Normal range of motion.  Skin:    General: Skin is warm.  Neurological:     General: No focal deficit present.     Mental Status: He is alert and oriented to person, place, and time.  Psychiatric:        Behavior: Behavior normal.        Judgment: Judgment normal.      LABORATORY DATA:  I have reviewed the data as listed Lab Results  Component Value Date   WBC 2.9 (L) 11/13/2024   HGB 7.3 (L) 11/13/2024   HCT 25.1  (L) 11/13/2024   MCV 80.7 11/13/2024   PLT 104 (L) 11/13/2024   Recent Labs    09/13/24 0954 10/15/24 1401 11/13/24 1447  NA 141 137 135  K 3.9 3.8 4.1  CL 108 105 104  CO2 23 22 21*  GLUCOSE 101* 121* 100*  BUN 13 11 12   CREATININE 1.04 1.06 1.07  CALCIUM  8.9 8.6* 8.8*  GFRNONAA >60 >60 >60  PROT 6.5 6.0* 6.0*  ALBUMIN 3.9 3.6 3.7  AST 19 17 16   ALT 11 8 10   ALKPHOS 165* 155* 185*  BILITOT 0.7 0.5 0.8     No results found.   ASSESSMENT & PLAN:   Symptomatic anemia # Pancytopenia-with predominant anemia [progressively worse anemia since MAY 2024; since Jan 2025- Mild moderate thrombocytopenia/leukopenia-neutropenia/lymphopenia] BONE MARROW BIOPSY: AUG 2025- - Normocellular bone marrow with equivocal erythroid and megakaryocytic dyspoiesis  There is slight nuclear irregularity to the erythroid precursors; however, definitive dysplasia is not identified.  Likewise, the megakaryocytes show a mild amount of hyper lobulation, but overall they are adequate in number and without definitive morphologic evidence of dysplasia.  The myeloid series is unremarkable.  CD34 focally highlights minimal increased blasts (still under 5%) with focal minimal clusters (2-3) suggestive of but not definitive for abnormally localized immature precursors.  A hypercellularity is not present.  The overall findings could be reactive in nature however some of the findings are suspicious for possible MDS but not morphologically pathognomonic. NGS- NEGATIVE.  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 Currently awaiting further evaluation at [facility] for a second opinion on dec 10th- Dr.Decastro.   # Anemia-likely multifactorial-iron  deficiency/clonal cytopenia of undetermined significance  -especially given improvement after iron  infusion.  Etiology of iron  deficiency unclear-question related to occult bleeding/as recently on Eliquis  [currently Derrick is off Eliquis ].  Proceed with Venofer .  Await capsule study-   # History of autoimmune liver disease primary biliary [given elevated alkaline phosphatase -March 2024 status post liver biopsy-fibrosis no cirrhosis; [facility] on [facility]]; [facility]-March 2025-colonoscop; yMRI- March 2024- no cirrhosis/splenomegaly. Discussed with GI re: capsule.   # A.fib  [Dr. Wilburn; [facility]- Cards]-given the ongoing iron  deficiency-suspect chronic/occult GI bleed.  Discussed with cardiology currently on Eliquis - monitor closely given the component of iron  deficiency however- NO active bleeding noted.  # Rheumatoid arthritis- [Dr.Patel]-on hydroxychloroquine- stable.   # OSA-CPAP- COPD [[facility]-Pul]- stable-   # DISPOSITION: # venofer  today #  proceed with Blood tomorrow # weekly venofer  x 4  # Follow up with APP- in 4 weeks- 2-3 days PRIOR- labs- cbc/cmp; possible venofer - HOLD tube D-2 possible 1 unit Blood-  Dr.B       Cindy JONELLE Joe, MD 11/14/2024 2:18 PM

## 2024-11-15 ENCOUNTER — Inpatient Hospital Stay

## 2024-11-15 ENCOUNTER — Other Ambulatory Visit: Payer: Self-pay | Admitting: Internal Medicine

## 2024-11-15 DIAGNOSIS — D649 Anemia, unspecified: Secondary | ICD-10-CM

## 2024-11-15 MED ORDER — SODIUM CHLORIDE 0.9% IV SOLUTION
250.0000 mL | INTRAVENOUS | Status: DC
  Administered 2024-11-15: 250 mL via INTRAVENOUS
  Filled 2024-11-15: qty 250

## 2024-11-15 MED ORDER — DIPHENHYDRAMINE HCL 25 MG PO TABS
25.0000 mg | ORAL_TABLET | Freq: Once | ORAL | Status: AC
  Administered 2024-11-15: 25 mg via ORAL
  Filled 2024-11-15: qty 1

## 2024-11-15 MED ORDER — ACETAMINOPHEN 325 MG PO TABS
650.0000 mg | ORAL_TABLET | Freq: Once | ORAL | Status: AC
  Administered 2024-11-15: 650 mg via ORAL
  Filled 2024-11-15: qty 2

## 2024-11-15 NOTE — Patient Instructions (Signed)
 Getting Blood Through an IV (Blood Transfusion) in Adults: What to Know After After a blood transfusion, it is common to have: Bruising and soreness at the IV site. A headache. Follow these instructions at home: Your doctor may give you more instructions. If you have problems, contact your doctor. Insertion site care     Follow instructions from your doctor about how to take care of your insertion site. This is where an IV tube was put into your vein. Make sure you: Wash your hands with soap and water  for at least 20 seconds before and after you change your bandage. If you cannot use soap and water , use hand sanitizer. Change your bandage as told by your doctor. Check your insertion site every day for signs of infection. Check for: Redness, swelling, or pain. Bleeding from the site. Warmth. Pus or a bad smell. General instructions Take over-the-counter and prescription medicines only as told by your doctor. Rest as told by your doctor. Go back to your normal activities as told by your doctor. Keep all follow-up visits. You may need to have tests at certain times to check your blood. Contact a doctor if: You have itching or red, swollen areas of skin (hives). You have a fever or chills. You have pain in the head, back, or chest. You feel worried or nervous (anxious). You feel weak after doing your normal activities. You have any of these problems at the insertion site: Redness, swelling, warmth, or pain. Bleeding that does not stop with pressure. Pus or a bad smell. If you received your blood transfusion in an outpatient setting, you will be told whom to contact to report any reactions. Get help right away if: You have signs of a serious reaction. This may be coming from an allergy or the body's defense system (immune system). Signs include: Trouble breathing or shortness of breath. Swelling of the face or feeling warm (flushed). A widespread rash. Dark pee (urine) or blood in  the pee. Fast heartbeat. These symptoms may be an emergency. Get help right away. Call 911. Do not wait to see if the symptoms will go away. Do not drive yourself to the hospital. Summary Bruising and soreness at the IV site are common. Check your insertion site every day for signs of infection. Rest as told by your doctor. Go back to your normal activities as told by your doctor. Get help right away if you have signs of a serious reaction. This information is not intended to replace advice given to you by your health care provider. Make sure you discuss any questions you have with your health care provider. Document Revised: 08/02/2024 Document Reviewed: 12/25/2021 Elsevier Patient Education  2025 [employer].

## 2024-11-16 ENCOUNTER — Ambulatory Visit: Admission: RE | Admit: 2024-11-16 | Source: Ambulatory Visit

## 2024-11-16 DIAGNOSIS — R918 Other nonspecific abnormal finding of lung field: Secondary | ICD-10-CM

## 2024-11-16 LAB — TYPE AND SCREEN
ABO/RH(D): B POS
Antibody Screen: NEGATIVE
Unit division: 0

## 2024-11-16 LAB — GLUCOSE, CAPILLARY: Glucose-Capillary: 95 mg/dL (ref 70–99)

## 2024-11-16 LAB — BPAM RBC
Blood Product Expiration Date: 202603042359
ISSUE DATE / TIME: 202602051442
Unit Type and Rh: 202603042359
Unit Type and Rh: 7300

## 2024-11-16 MED ORDER — FLUDEOXYGLUCOSE F - 18 (FDG) INJECTION
9.4600 | Freq: Once | INTRAVENOUS | Status: AC | PRN
  Administered 2024-11-16: 9.46 via INTRAVENOUS

## 2024-11-22 ENCOUNTER — Inpatient Hospital Stay

## 2024-11-29 ENCOUNTER — Inpatient Hospital Stay

## 2024-12-06 ENCOUNTER — Inpatient Hospital Stay

## 2024-12-11 ENCOUNTER — Inpatient Hospital Stay

## 2024-12-12 ENCOUNTER — Inpatient Hospital Stay: Admitting: Internal Medicine

## 2024-12-12 ENCOUNTER — Inpatient Hospital Stay

## 2024-12-13 ENCOUNTER — Inpatient Hospital Stay

## 2025-09-02 ENCOUNTER — Ambulatory Visit: Admitting: Dermatology
